# Patient Record
Sex: Female | Born: 1935
Health system: Southern US, Community
[De-identification: ages and names within clinical notes are randomized; demographics above are authoritative.]

## PROBLEM LIST (undated history)

## (undated) DIAGNOSIS — I10 Essential (primary) hypertension: Secondary | ICD-10-CM

## (undated) DIAGNOSIS — F419 Anxiety disorder, unspecified: Secondary | ICD-10-CM

## (undated) DIAGNOSIS — E7800 Pure hypercholesterolemia, unspecified: Secondary | ICD-10-CM

## (undated) DIAGNOSIS — H409 Unspecified glaucoma: Secondary | ICD-10-CM

## (undated) DIAGNOSIS — F32A Depression, unspecified: Secondary | ICD-10-CM

## (undated) DIAGNOSIS — H548 Legal blindness, as defined in USA: Secondary | ICD-10-CM

## (undated) DIAGNOSIS — E119 Type 2 diabetes mellitus without complications: Secondary | ICD-10-CM

## (undated) DIAGNOSIS — J449 Chronic obstructive pulmonary disease, unspecified: Secondary | ICD-10-CM

## (undated) DIAGNOSIS — K219 Gastro-esophageal reflux disease without esophagitis: Secondary | ICD-10-CM

## (undated) DIAGNOSIS — N189 Chronic kidney disease, unspecified: Secondary | ICD-10-CM

## (undated) DIAGNOSIS — F410 Panic disorder [episodic paroxysmal anxiety] without agoraphobia: Secondary | ICD-10-CM

## (undated) DIAGNOSIS — F329 Major depressive disorder, single episode, unspecified: Secondary | ICD-10-CM

## (undated) HISTORY — PX: HIP SURGERY: SHX245

## (undated) HISTORY — PX: APPENDECTOMY: SHX54

## (undated) HISTORY — PX: TUBAL LIGATION: SHX77

## (undated) HISTORY — PX: CHOLECYSTECTOMY: SHX55

## (undated) HISTORY — PX: OTHER SURGICAL HISTORY: SHX169

---

## 1998-05-04 ENCOUNTER — Inpatient Hospital Stay (HOSPITAL_COMMUNITY): Admission: EM | Admit: 1998-05-04 | Discharge: 1998-05-05 | Payer: Self-pay | Admitting: Cardiology

## 2001-02-04 ENCOUNTER — Encounter: Payer: Self-pay | Admitting: Internal Medicine

## 2001-02-04 ENCOUNTER — Ambulatory Visit (HOSPITAL_COMMUNITY): Admission: RE | Admit: 2001-02-04 | Discharge: 2001-02-04 | Payer: Self-pay | Admitting: Internal Medicine

## 2001-05-23 ENCOUNTER — Emergency Department (HOSPITAL_COMMUNITY): Admission: EM | Admit: 2001-05-23 | Discharge: 2001-05-23 | Payer: Self-pay | Admitting: Emergency Medicine

## 2001-05-23 ENCOUNTER — Encounter: Payer: Self-pay | Admitting: Emergency Medicine

## 2001-10-10 ENCOUNTER — Emergency Department (HOSPITAL_COMMUNITY): Admission: EM | Admit: 2001-10-10 | Discharge: 2001-10-10 | Payer: Self-pay | Admitting: *Deleted

## 2001-10-10 ENCOUNTER — Encounter: Payer: Self-pay | Admitting: *Deleted

## 2002-05-10 ENCOUNTER — Encounter: Payer: Self-pay | Admitting: *Deleted

## 2002-05-10 ENCOUNTER — Inpatient Hospital Stay (HOSPITAL_COMMUNITY): Admission: EM | Admit: 2002-05-10 | Discharge: 2002-05-18 | Payer: Self-pay | Admitting: *Deleted

## 2002-06-15 ENCOUNTER — Ambulatory Visit (HOSPITAL_COMMUNITY): Admission: RE | Admit: 2002-06-15 | Discharge: 2002-06-15 | Payer: Self-pay | Admitting: Pulmonary Disease

## 2002-06-22 ENCOUNTER — Encounter (HOSPITAL_COMMUNITY): Admission: RE | Admit: 2002-06-22 | Discharge: 2002-07-22 | Payer: Self-pay | Admitting: Pulmonary Disease

## 2002-10-17 ENCOUNTER — Encounter: Payer: Self-pay | Admitting: Emergency Medicine

## 2002-10-17 ENCOUNTER — Emergency Department (HOSPITAL_COMMUNITY): Admission: EM | Admit: 2002-10-17 | Discharge: 2002-10-17 | Payer: Self-pay | Admitting: Emergency Medicine

## 2002-12-17 ENCOUNTER — Ambulatory Visit (HOSPITAL_COMMUNITY): Admission: RE | Admit: 2002-12-17 | Discharge: 2002-12-17 | Payer: Self-pay | Admitting: Pulmonary Disease

## 2003-01-26 ENCOUNTER — Emergency Department (HOSPITAL_COMMUNITY): Admission: EM | Admit: 2003-01-26 | Discharge: 2003-01-27 | Payer: Self-pay | Admitting: Emergency Medicine

## 2003-01-27 ENCOUNTER — Encounter: Payer: Self-pay | Admitting: Emergency Medicine

## 2003-01-27 ENCOUNTER — Ambulatory Visit (HOSPITAL_COMMUNITY): Admission: RE | Admit: 2003-01-27 | Discharge: 2003-01-27 | Payer: Self-pay | Admitting: Emergency Medicine

## 2003-04-20 ENCOUNTER — Encounter (HOSPITAL_COMMUNITY): Admission: RE | Admit: 2003-04-20 | Discharge: 2003-05-20 | Payer: Self-pay | Admitting: *Deleted

## 2003-05-13 ENCOUNTER — Ambulatory Visit (HOSPITAL_COMMUNITY): Admission: RE | Admit: 2003-05-13 | Discharge: 2003-05-13 | Payer: Self-pay | Admitting: Pulmonary Disease

## 2003-11-10 ENCOUNTER — Ambulatory Visit (HOSPITAL_COMMUNITY): Admission: RE | Admit: 2003-11-10 | Discharge: 2003-11-10 | Payer: Self-pay | Admitting: Pulmonary Disease

## 2004-05-24 ENCOUNTER — Ambulatory Visit (HOSPITAL_COMMUNITY): Admission: RE | Admit: 2004-05-24 | Discharge: 2004-05-24 | Payer: Self-pay

## 2004-06-01 ENCOUNTER — Ambulatory Visit (HOSPITAL_COMMUNITY): Admission: RE | Admit: 2004-06-01 | Discharge: 2004-06-01 | Payer: Self-pay | Admitting: Pulmonary Disease

## 2004-08-27 HISTORY — PX: ESOPHAGOGASTRODUODENOSCOPY: SHX1529

## 2004-10-05 ENCOUNTER — Ambulatory Visit (HOSPITAL_COMMUNITY): Admission: RE | Admit: 2004-10-05 | Discharge: 2004-10-05 | Payer: Self-pay | Admitting: Podiatry

## 2004-10-19 ENCOUNTER — Inpatient Hospital Stay (HOSPITAL_COMMUNITY): Admission: EM | Admit: 2004-10-19 | Discharge: 2004-10-22 | Payer: Self-pay | Admitting: Emergency Medicine

## 2004-10-20 ENCOUNTER — Ambulatory Visit: Payer: Self-pay | Admitting: *Deleted

## 2004-11-30 ENCOUNTER — Ambulatory Visit (HOSPITAL_COMMUNITY): Admission: RE | Admit: 2004-11-30 | Discharge: 2004-11-30 | Payer: Self-pay | Admitting: Pulmonary Disease

## 2004-12-10 ENCOUNTER — Emergency Department (HOSPITAL_COMMUNITY): Admission: EM | Admit: 2004-12-10 | Discharge: 2004-12-10 | Payer: Self-pay | Admitting: Emergency Medicine

## 2005-02-13 ENCOUNTER — Inpatient Hospital Stay (HOSPITAL_COMMUNITY): Admission: EM | Admit: 2005-02-13 | Discharge: 2005-02-17 | Payer: Self-pay | Admitting: Emergency Medicine

## 2005-03-30 ENCOUNTER — Ambulatory Visit (HOSPITAL_COMMUNITY): Admission: RE | Admit: 2005-03-30 | Discharge: 2005-03-30 | Payer: Self-pay | Admitting: Pulmonary Disease

## 2005-04-11 ENCOUNTER — Ambulatory Visit (HOSPITAL_COMMUNITY): Admission: RE | Admit: 2005-04-11 | Discharge: 2005-04-11 | Payer: Self-pay | Admitting: Internal Medicine

## 2005-04-11 ENCOUNTER — Ambulatory Visit: Payer: Self-pay | Admitting: Internal Medicine

## 2005-04-22 ENCOUNTER — Observation Stay (HOSPITAL_COMMUNITY): Admission: EM | Admit: 2005-04-22 | Discharge: 2005-04-24 | Payer: Self-pay | Admitting: Emergency Medicine

## 2005-04-24 ENCOUNTER — Ambulatory Visit: Payer: Self-pay | Admitting: Orthopedic Surgery

## 2005-05-07 ENCOUNTER — Ambulatory Visit: Payer: Self-pay | Admitting: Orthopedic Surgery

## 2005-05-22 ENCOUNTER — Ambulatory Visit (HOSPITAL_COMMUNITY): Admission: RE | Admit: 2005-05-22 | Discharge: 2005-05-22 | Payer: Self-pay | Admitting: Pulmonary Disease

## 2005-06-07 ENCOUNTER — Ambulatory Visit: Payer: Self-pay | Admitting: Orthopedic Surgery

## 2005-06-12 ENCOUNTER — Emergency Department (HOSPITAL_COMMUNITY): Admission: EM | Admit: 2005-06-12 | Discharge: 2005-06-13 | Payer: Self-pay | Admitting: Emergency Medicine

## 2005-06-19 ENCOUNTER — Ambulatory Visit: Payer: Self-pay | Admitting: Orthopedic Surgery

## 2005-06-19 ENCOUNTER — Encounter: Payer: Self-pay | Admitting: Orthopedic Surgery

## 2005-06-19 ENCOUNTER — Inpatient Hospital Stay (HOSPITAL_COMMUNITY): Admission: RE | Admit: 2005-06-19 | Discharge: 2005-06-26 | Payer: Self-pay | Admitting: Orthopedic Surgery

## 2005-07-09 ENCOUNTER — Ambulatory Visit: Payer: Self-pay | Admitting: Orthopedic Surgery

## 2005-07-30 ENCOUNTER — Ambulatory Visit: Payer: Self-pay | Admitting: Orthopedic Surgery

## 2005-08-02 ENCOUNTER — Encounter: Admission: RE | Admit: 2005-08-02 | Discharge: 2005-08-02 | Payer: Self-pay | Admitting: Orthopedic Surgery

## 2005-08-16 ENCOUNTER — Encounter: Admission: RE | Admit: 2005-08-16 | Discharge: 2005-08-16 | Payer: Self-pay | Admitting: Orthopedic Surgery

## 2005-09-07 ENCOUNTER — Encounter: Admission: RE | Admit: 2005-09-07 | Discharge: 2005-09-07 | Payer: Self-pay | Admitting: Orthopedic Surgery

## 2005-09-12 ENCOUNTER — Ambulatory Visit: Payer: Self-pay | Admitting: Orthopedic Surgery

## 2005-10-10 ENCOUNTER — Ambulatory Visit: Payer: Self-pay | Admitting: Orthopedic Surgery

## 2005-10-31 ENCOUNTER — Emergency Department (HOSPITAL_COMMUNITY): Admission: EM | Admit: 2005-10-31 | Discharge: 2005-10-31 | Payer: Self-pay | Admitting: Emergency Medicine

## 2005-11-12 ENCOUNTER — Ambulatory Visit: Payer: Self-pay | Admitting: Orthopedic Surgery

## 2005-12-05 ENCOUNTER — Ambulatory Visit: Payer: Self-pay | Admitting: Orthopedic Surgery

## 2005-12-06 ENCOUNTER — Ambulatory Visit (HOSPITAL_COMMUNITY): Admission: RE | Admit: 2005-12-06 | Discharge: 2005-12-06 | Payer: Self-pay | Admitting: Orthopedic Surgery

## 2005-12-20 ENCOUNTER — Ambulatory Visit: Payer: Self-pay | Admitting: Orthopedic Surgery

## 2006-01-04 ENCOUNTER — Ambulatory Visit (HOSPITAL_COMMUNITY): Admission: RE | Admit: 2006-01-04 | Discharge: 2006-01-04 | Payer: Self-pay | Admitting: Orthopedic Surgery

## 2006-01-09 ENCOUNTER — Ambulatory Visit (HOSPITAL_COMMUNITY): Admission: RE | Admit: 2006-01-09 | Discharge: 2006-01-09 | Payer: Self-pay | Admitting: Pulmonary Disease

## 2006-04-10 ENCOUNTER — Ambulatory Visit: Payer: Self-pay | Admitting: Orthopedic Surgery

## 2006-06-03 ENCOUNTER — Ambulatory Visit: Payer: Self-pay | Admitting: Orthopedic Surgery

## 2006-10-02 ENCOUNTER — Emergency Department (HOSPITAL_COMMUNITY): Admission: EM | Admit: 2006-10-02 | Discharge: 2006-10-02 | Payer: Self-pay | Admitting: Emergency Medicine

## 2007-02-11 ENCOUNTER — Ambulatory Visit (HOSPITAL_COMMUNITY): Admission: RE | Admit: 2007-02-11 | Discharge: 2007-02-11 | Payer: Self-pay | Admitting: Pulmonary Disease

## 2007-05-13 DIAGNOSIS — E119 Type 2 diabetes mellitus without complications: Secondary | ICD-10-CM | POA: Insufficient documentation

## 2007-09-01 ENCOUNTER — Ambulatory Visit (HOSPITAL_COMMUNITY): Admission: RE | Admit: 2007-09-01 | Discharge: 2007-09-01 | Payer: Self-pay | Admitting: Pulmonary Disease

## 2007-11-17 ENCOUNTER — Ambulatory Visit: Payer: Self-pay | Admitting: Orthopedic Surgery

## 2007-11-17 DIAGNOSIS — M75120 Complete rotator cuff tear or rupture of unspecified shoulder, not specified as traumatic: Secondary | ICD-10-CM | POA: Insufficient documentation

## 2007-11-17 DIAGNOSIS — M25519 Pain in unspecified shoulder: Secondary | ICD-10-CM | POA: Insufficient documentation

## 2007-12-24 ENCOUNTER — Ambulatory Visit: Payer: Self-pay | Admitting: Orthopedic Surgery

## 2007-12-24 DIAGNOSIS — M542 Cervicalgia: Secondary | ICD-10-CM | POA: Insufficient documentation

## 2008-01-21 ENCOUNTER — Encounter: Payer: Self-pay | Admitting: Orthopedic Surgery

## 2008-01-21 ENCOUNTER — Ambulatory Visit (HOSPITAL_COMMUNITY): Admission: RE | Admit: 2008-01-21 | Discharge: 2008-01-21 | Payer: Self-pay | Admitting: Pulmonary Disease

## 2008-01-27 ENCOUNTER — Ambulatory Visit (HOSPITAL_COMMUNITY): Admission: RE | Admit: 2008-01-27 | Discharge: 2008-01-27 | Payer: Self-pay | Admitting: Pulmonary Disease

## 2008-01-27 ENCOUNTER — Encounter: Payer: Self-pay | Admitting: Orthopedic Surgery

## 2008-01-29 ENCOUNTER — Encounter: Payer: Self-pay | Admitting: Orthopedic Surgery

## 2008-02-10 ENCOUNTER — Ambulatory Visit: Payer: Self-pay | Admitting: Orthopedic Surgery

## 2008-02-10 DIAGNOSIS — M25569 Pain in unspecified knee: Secondary | ICD-10-CM | POA: Insufficient documentation

## 2008-02-10 DIAGNOSIS — G5620 Lesion of ulnar nerve, unspecified upper limb: Secondary | ICD-10-CM | POA: Insufficient documentation

## 2008-02-10 DIAGNOSIS — IMO0002 Reserved for concepts with insufficient information to code with codable children: Secondary | ICD-10-CM | POA: Insufficient documentation

## 2008-02-10 DIAGNOSIS — M1710 Unilateral primary osteoarthritis, unspecified knee: Secondary | ICD-10-CM

## 2008-02-11 ENCOUNTER — Ambulatory Visit (HOSPITAL_COMMUNITY): Admission: RE | Admit: 2008-02-11 | Discharge: 2008-02-11 | Payer: Self-pay | Admitting: Pulmonary Disease

## 2008-03-17 ENCOUNTER — Ambulatory Visit: Payer: Self-pay | Admitting: Orthopedic Surgery

## 2008-03-17 DIAGNOSIS — M25559 Pain in unspecified hip: Secondary | ICD-10-CM | POA: Insufficient documentation

## 2008-03-17 DIAGNOSIS — M5430 Sciatica, unspecified side: Secondary | ICD-10-CM | POA: Insufficient documentation

## 2008-03-19 ENCOUNTER — Telehealth: Payer: Self-pay | Admitting: Orthopedic Surgery

## 2008-03-30 ENCOUNTER — Encounter: Payer: Self-pay | Admitting: Orthopedic Surgery

## 2008-04-14 ENCOUNTER — Ambulatory Visit: Payer: Self-pay | Admitting: Orthopedic Surgery

## 2008-04-14 DIAGNOSIS — Z96649 Presence of unspecified artificial hip joint: Secondary | ICD-10-CM | POA: Insufficient documentation

## 2008-04-23 ENCOUNTER — Telehealth: Payer: Self-pay | Admitting: Orthopedic Surgery

## 2008-06-01 ENCOUNTER — Encounter: Payer: Self-pay | Admitting: Orthopedic Surgery

## 2008-09-22 ENCOUNTER — Ambulatory Visit: Payer: Self-pay | Admitting: Orthopedic Surgery

## 2009-04-04 ENCOUNTER — Ambulatory Visit: Payer: Self-pay | Admitting: Orthopedic Surgery

## 2009-04-16 ENCOUNTER — Emergency Department (HOSPITAL_COMMUNITY): Admission: EM | Admit: 2009-04-16 | Discharge: 2009-04-16 | Payer: Self-pay | Admitting: Emergency Medicine

## 2009-04-18 ENCOUNTER — Ambulatory Visit (HOSPITAL_COMMUNITY): Admission: RE | Admit: 2009-04-18 | Discharge: 2009-04-18 | Payer: Self-pay | Admitting: Pulmonary Disease

## 2009-06-14 ENCOUNTER — Ambulatory Visit: Payer: Self-pay | Admitting: Orthopedic Surgery

## 2009-07-06 ENCOUNTER — Telehealth: Payer: Self-pay | Admitting: Orthopedic Surgery

## 2009-07-06 ENCOUNTER — Ambulatory Visit: Payer: Self-pay | Admitting: Orthopedic Surgery

## 2009-07-12 ENCOUNTER — Encounter: Payer: Self-pay | Admitting: Orthopedic Surgery

## 2010-02-08 ENCOUNTER — Ambulatory Visit: Payer: Self-pay | Admitting: Orthopedic Surgery

## 2010-03-08 ENCOUNTER — Inpatient Hospital Stay (HOSPITAL_COMMUNITY): Admission: RE | Admit: 2010-03-08 | Discharge: 2010-03-11 | Payer: Self-pay | Admitting: Pulmonary Disease

## 2010-05-24 ENCOUNTER — Ambulatory Visit: Payer: Self-pay | Admitting: Orthopedic Surgery

## 2010-06-09 ENCOUNTER — Emergency Department (HOSPITAL_COMMUNITY): Admission: EM | Admit: 2010-06-09 | Discharge: 2010-06-09 | Payer: Self-pay | Admitting: Emergency Medicine

## 2010-08-08 ENCOUNTER — Encounter: Payer: Self-pay | Admitting: Orthopedic Surgery

## 2010-08-15 ENCOUNTER — Ambulatory Visit: Payer: Self-pay | Admitting: Orthopedic Surgery

## 2010-09-17 ENCOUNTER — Encounter: Payer: Self-pay | Admitting: Orthopedic Surgery

## 2010-09-26 NOTE — Assessment & Plan Note (Signed)
Summary: LEFT KNEE PAIN REQUEST INJECTION MAY NEED NEW XR/MEDICARE/MED...   Visit Type:  Follow-up  CC:  left knee pain.  History of Present Illness: I saw [name] in the office today for a followup visit.  She is a 77 years old woman with the complaint of:  left knee OA.  04/04/09 last injection left knee, requests injection today.  Also has bad back, gets ESI's Dr. Ace Gins, Dr. Luiz Ochoa is back DR.  Vicodin 5 and Neurontin 300 for pain.  Chronic LEFT knee pain osteoarthritis patient not interested in surgery here for injection  LEFT knee injection in the joint Verbal consent was obtained. The knee was prepped with alcohol and ethyl chloride. 1 cc of depomedrol 40mg /cc and 4 cc of lidocaine 1% was injected. there were no complications.     Allergies: 1)  ! Phenergan 2)  ! * Dilaudid   Impression & Recommendations:  Problem # 1:  KNEE PAIN [id number]) Assessment Deteriorated  Orders: Joint Aspirate / Injection, Large (20610) Depo- Medrol 40mg  (J1030)  Problem # 2:  KNEE, ARTHRITIS, DEGEN./OSTEO (ICD-715.96) Assessment: Deteriorated  Orders: Joint Aspirate / Injection, Large (20610) Depo- Medrol 40mg  (J1030)  Patient Instructions: 1)  You have received an injection of cortisone today. You may experience increased pain at the injection site. Apply ice pack to the area for 20 minutes every 2 hours and take 2 xtra strength tylenol every 8 hours. This increased pain will usually resolve in 24 hours. The injection will take effect in 3-10 days.   2)  ok to get injection in 3 months if needed

## 2010-09-26 NOTE — Assessment & Plan Note (Signed)
Summary: REQ INJECTION LEFT KNEE MAY NEED NEW  xr/mcr/mcd/bsf   Visit Type:  Follow-up  CC:  left knee pain.  History of Present Illness: I saw [name] in the office today for a followup visit.  She is a 77 years old woman with the complaint of:  LEFT knee pain.  She had her last injection in June of this year. Requests another injection for pain, which started 2 weeks ago, primarily over the lateral portion of the knee associated with some catching I. maybe some mild swelling.  Had a RIGHT total hip arthroplasty in 2006. She is followed by Dr. Alisa Graff, and Dr.Bethea for continued back problems. She is currently on the following medications  Vicodin 5 and Neurontin 300 for pain. No change in medications.  Chronic LEFT knee pain osteoarthritis patient not interested in surgery here for injection  ROS: RT THA 2006   Allergies: 1)  ! Phenergan 2)  ! * Dilaudid  Physical Exam  Extremities:  she is ambulating with a cane in her RIGHT hand occasionally uses it in the LEFT hand. She has a noticeable limp.  He has tenderness over the lateral joint line with a mild effusion. She has normal range of motion from 0 to 110. She has no catching or locking. On range of motion. Her McMurray sign is negative. Her muscle tone is normal. Her knee feels stable. She has normal sensation in the LEFT leg with a 2+ pulse in the dorsalis pedis.   Impression & Recommendations:  Problem # 1:  KNEE, ARTHRITIS, DEGEN./OSTEO [id number]) Assessment Deteriorated  I injected the LEFT knee. Lateral approach Verbal consent was obtained. The knee was prepped with alcohol and ethyl chloride. 1 cc of depomedrol 40mg /cc and 4 cc of lidocaine 1% was injected. there were no complications.  Orders: Est. Patient Level III [id number]) Joint Aspirate / Injection, Large (20610) Depo- Medrol 40mg  (J1030)  Patient Instructions: 1)  You have received an injection of cortisone today. You may experience increased pain  at the injection site. Apply ice pack to the area for 20 minutes every 2 hours and take 2 xtra strength tylenol every 8 hours. This increased pain will usually resolve in 24 hours. The injection will take effect in 3-10 days.   2)  Please schedule a follow-up appointment as needed.

## 2010-09-28 NOTE — Medication Information (Signed)
Summary: RX Folder Comm [employer] coding  RX Folder Comm [employer] coding   Imported By: Ihor Austin 08/29/2010 18:05:16  _____________________________________________________________________  External Attachment:    Type:   Image     Comment:   External Document

## 2010-09-28 NOTE — Assessment & Plan Note (Signed)
Summary: RE-CK/XRAY RT HIP/THA FOL/UP/MEDICARE,MCD/CAF   Visit Type:  Follow-up  CC:  left knee pain.  History of Present Illness: DX:OA right hip   Treatment:RT THA 2006  MEDS:   Complaints:left kneepain / h/o OA; requests a shot   Today, scheduled for:RT THA XRAYS AP AND CROSS TABLE LATERAL   Radiographs of the RIGHT total hip show the following: Appearing stem and acetabulum with good anteversion in the cup. No signs of loosening prosthesis appears stable.  Impression stable prosthesis.  Patient requested injection LEFT knee.  Verbal consent was obtained. The left knee was prepped with alcohol and ethyl chloride. 1 cc of depomedrol 40mg /cc and 4 cc of lidocaine 1% was injected. there were no complications.    Allergies: 1)  ! Phenergan 2)  ! * Dilaudid   Impression & Recommendations:  Problem # 1:  TOTAL HIP FOLLOW-UP (ICD-V43.64) Assessment Comment Only  Orders: Est. Patient Level III [id number]) Pelvis x-ray, 1/2 views [id number]) Hip x-ray unilateral complete, minimum 2 views [id number])  Problem # 2:  KNEE, ARTHRITIS, DEGEN./OSTEO (ICD-715.96) Assessment: Deteriorated  Orders: Est. Patient Level III [id number]) Joint Aspirate / Injection, Large (20610) Depo- Medrol 40mg  (J1030)  Patient Instructions: 1)  ASPERCREME three times a day left knee  2)  return in 1 year THA XRAYS   Orders Added: 1)  Est. Patient Level III [id number] 2)  Pelvis x-ray, 1/2 views [72170] 3)  Hip x-ray unilateral complete, minimum 2 views [73510] 4)  Joint Aspirate / Injection, Large [20610] 5)  Depo- Medrol 40mg  [J1030]

## 2010-09-28 NOTE — Letter (Signed)
Summary: Updated medication list  Updated medication list   Imported By: Ruffin Pyo 08/16/2010 07:47:07  _____________________________________________________________________  External Attachment:    Type:   Image     Comment:   External Document

## 2010-10-13 ENCOUNTER — Other Ambulatory Visit (HOSPITAL_COMMUNITY): Payer: Self-pay | Admitting: Pulmonary Disease

## 2010-10-13 ENCOUNTER — Ambulatory Visit (HOSPITAL_COMMUNITY)
Admission: RE | Admit: 2010-10-13 | Discharge: 2010-10-13 | Disposition: A | Discharge status: Home / Self Care | Payer: Medicare Other | Source: Ambulatory Visit | Attending: Pulmonary Disease | Admitting: Pulmonary Disease

## 2010-10-13 DIAGNOSIS — M5380 Other specified dorsopathies, site unspecified: Secondary | ICD-10-CM | POA: Insufficient documentation

## 2010-10-13 DIAGNOSIS — M542 Cervicalgia: Secondary | ICD-10-CM | POA: Insufficient documentation

## 2010-10-13 DIAGNOSIS — M5030 Other cervical disc degeneration, unspecified cervical region: Secondary | ICD-10-CM | POA: Insufficient documentation

## 2010-11-11 LAB — CBC
MCV: 90.2 fL (ref 78.0–100.0)
Platelets: 272 10*3/uL (ref 150–400)
RBC: 3.64 MIL/uL — ABNORMAL LOW (ref 3.87–5.11)
RDW: 15.5 % (ref 11.5–15.5)
WBC: 12.9 10*3/uL — ABNORMAL HIGH (ref 4.0–10.5)

## 2010-11-11 LAB — BASIC METABOLIC PANEL
BUN: 28 mg/dL — ABNORMAL HIGH (ref 6–23)
Calcium: 8.3 mg/dL — ABNORMAL LOW (ref 8.4–10.5)
Chloride: 110 mEq/L (ref 96–112)
Creatinine, Ser: 1.34 mg/dL — ABNORMAL HIGH (ref 0.4–1.2)
GFR calc Af Amer: 46 mL/min — ABNORMAL LOW (ref >60)
GFR calc non Af Amer: 38 mL/min — ABNORMAL LOW (ref >60)

## 2010-11-11 LAB — DIFFERENTIAL
Basophils Absolute: 0 10*3/uL (ref 0.0–0.1)
Eosinophils Relative: 0 % (ref 0–5)
Lymphocytes Relative: 4 % — ABNORMAL LOW (ref 12–46)
Lymphs Abs: 0.5 10*3/uL — ABNORMAL LOW (ref 0.7–4.0)
Neutrophils Relative %: 95 % — ABNORMAL HIGH (ref 43–77)

## 2010-11-11 LAB — GLUCOSE, CAPILLARY
Glucose-Capillary: 250 mg/dL — ABNORMAL HIGH (ref 70–99)
Glucose-Capillary: 322 mg/dL — ABNORMAL HIGH (ref 70–99)
Glucose-Capillary: 335 mg/dL — ABNORMAL HIGH (ref 70–99)
Glucose-Capillary: 355 mg/dL — ABNORMAL HIGH (ref 70–99)

## 2010-11-12 LAB — DIFFERENTIAL
Eosinophils Relative: 2 % (ref 0–5)
Lymphocytes Relative: 21 % (ref 12–46)
Lymphs Abs: 1.5 10*3/uL (ref 0.7–4.0)
Monocytes Absolute: 0.4 10*3/uL (ref 0.1–1.0)
Monocytes Relative: 6 % (ref 3–12)

## 2010-11-12 LAB — GLUCOSE, CAPILLARY
Glucose-Capillary: 330 mg/dL — ABNORMAL HIGH (ref 70–99)
Glucose-Capillary: 352 mg/dL — ABNORMAL HIGH (ref 70–99)
Glucose-Capillary: 372 mg/dL — ABNORMAL HIGH (ref 70–99)
Glucose-Capillary: 376 mg/dL — ABNORMAL HIGH (ref 70–99)

## 2010-11-12 LAB — CBC
MCH: 29.9 pg (ref 26.0–34.0)
MCHC: 33.2 g/dL (ref 30.0–36.0)
Platelets: 258 10*3/uL (ref 150–400)
RBC: 4.26 MIL/uL (ref 3.87–5.11)

## 2010-11-12 LAB — URINALYSIS, ROUTINE W REFLEX MICROSCOPIC
Bilirubin Urine: NEGATIVE
Ketones, ur: NEGATIVE mg/dL
Nitrite: NEGATIVE
Specific Gravity, Urine: 1.015 (ref 1.005–1.030)
Urobilinogen, UA: 0.2 mg/dL (ref 0.0–1.0)

## 2010-11-12 LAB — COMPREHENSIVE METABOLIC PANEL
AST: 16 U/L (ref 0–37)
Albumin: 3.8 g/dL (ref 3.5–5.2)
Calcium: 8.9 mg/dL (ref 8.4–10.5)
Creatinine, Ser: 1.63 mg/dL — ABNORMAL HIGH (ref 0.4–1.2)
GFR calc Af Amer: 37 mL/min — ABNORMAL LOW (ref >60)
Sodium: 139 mEq/L (ref 135–145)

## 2010-12-02 LAB — CBC
Hemoglobin: 11.9 g/dL — ABNORMAL LOW (ref 12.0–15.0)
Platelets: 243 10*3/uL (ref 150–400)
RDW: 15.1 % (ref 11.5–15.5)

## 2010-12-02 LAB — BASIC METABOLIC PANEL
Calcium: 9.1 mg/dL (ref 8.4–10.5)
GFR calc non Af Amer: 32 mL/min — ABNORMAL LOW (ref >60)
Glucose, Bld: 225 mg/dL — ABNORMAL HIGH (ref 70–99)
Sodium: 140 mEq/L (ref 135–145)

## 2010-12-02 LAB — DIFFERENTIAL
Basophils Absolute: 0 10*3/uL (ref 0.0–0.1)
Lymphocytes Relative: 26 % (ref 12–46)
Monocytes Absolute: 0.4 10*3/uL (ref 0.1–1.0)
Neutro Abs: 4.7 10*3/uL (ref 1.7–7.7)

## 2011-01-08 ENCOUNTER — Other Ambulatory Visit: Payer: Self-pay | Admitting: Orthopedic Surgery

## 2011-01-12 NOTE — Group Therapy Note (Signed)
NAMEKADYNN, SCHANTZ                ACCOUNT NO.:  [id number]   MEDICAL RECORD NO.:  [id number]          PATIENT TYPE:  INP   LOCATION:  [id number]                          FACILITY:  [facility]   PHYSICIAN:  Edward L. Luan Pulling, M.D.DATE OF BIRTH:  09-08-1928   DATE OF PROCEDURE:  02/13/2005  DATE OF DISCHARGE:                                   PROGRESS NOTE   PROBLEM:  COPD.   SUBJECTIVE:  Mrs. Linnehan says she is better.  She had some chest discomfort  again last night.  Again she has had cardiac disease essentially ruled out.  She has no other new complaints.   Her exam shows that her temperature is 97.2, pulse 47, respirations 20.  Blood sugar 199.  Blood pressure 124/75.  O2 sats 99% on 2 liters.  Her  chest is clearer than it has been.   ASSESSMENT:  She is better.   Plan is to continue treatments and medicines and follow.  No other new  medicines today.       ELH/MEDQ  D:  02/15/2005  T:  02/15/2005  Job:  [id number]

## 2011-01-12 NOTE — Group Therapy Note (Signed)
   NAME:  [name], [name]                          ACCOUNT NO.:  [id number]   MEDICAL RECORD NO.:  [id number]                   PATIENT TYPE:  INP   LOCATION:  A326                                 FACILITY:  [facility]   PHYSICIAN:  Edward L. Luan Pulling, M.D.             DATE OF BIRTH:  19-May-1929   DATE OF PROCEDURE:  DATE OF DISCHARGE:                                   PROGRESS NOTE   PROBLEM:  Pneumonia, diabetes, reflux esophagitis.   SUBJECTIVE:  The patient says that she is feeling much better today.  She  has a headache and has a lot of congestion in her head, but she is not short  of breath.  Her cultures are negative as far as blood cultures are  concerned.   OBJECTIVE:  Her exam shows that her chest is much clearer.  She is afebrile.  Blood pressure 130/70.  Her blood sugars are all less than 200 at this  point.   ASSESSMENT:  She is much improved.   PLAN:  Plan is for discharge.  Please see discharge summary for details.                                               Edward L. Luan Pulling, M.D.    ELH/MEDQ  D:  05/18/2002  T:  05/19/2002  Job:  [id number]

## 2011-01-12 NOTE — Consult Note (Signed)
[name], LABORE NO.:  [id number]   MEDICAL RECORD NO.:  [id number]          PATIENT TYPE:  OBV   LOCATION:  A307                          FACILITY:  [facility]   PHYSICIAN:  Carole Civil, M.D.DATE OF BIRTH:  [birth year]   DATE OF CONSULTATION:  04/24/2005  DATE OF DISCHARGE:  04/24/2005                                   CONSULTATION   REASON FOR CONSULTATION:  Right hip pain.   REQUESTING PHYSICIAN:  Edward L. Luan Pulling, M.D.   HISTORY:  This is a 77 year old female with history of hypertension, COPD,  diabetes, hernia, hiatal hernia, GERD, lipids are elevated. She had a  tonsillectomy, cholecystectomy, vaginal hysterectomy, appendectomy.   ALLERGIES:  No allergies.   MEDICATIONS:  She is on Altace, glyburide, metformin, Aciphex, Xanax,  fluconazole, Xalatan, albuterol, and Atrovent.   FAMILY HISTORY:  She has family history of diabetes, hypertension, COPD.   SOCIAL HISTORY:  Does not smoke or drink.   REVIEW OF SYSTEMS:  Negative.   This patient presented with right hip pain. It was questionable whether she  had fracture versus osteoarthritis versus lumbar disease. She complained of  a catching sensation in the hip and had a severe pain and could not walk.  Was admitted for workup and pain control.   PHYSICAL EXAMINATION:  VITAL SIGNS:  Her exam showed a temperature of 97.4,  pulse 66, respiratory rate 16, blood pressure 120/80. Height was 64 inches.  GENERAL:  She was generally well developed and nourished. Grooming and  hygiene were normal.  CARDIOVASCULAR:  She had a relatively normal cardiovascular exam.  SKIN:  Skin integrity was intact in all four extremities.  LYMPH NODES:  She had no obvious lymph nodes.  NEUROLOGICAL:  She was awake, alert, and oriented x3. Mood and affect are  normal. Had a normal gross neurological exam.  EXTREMITIES:  Her right hip did have crepitance, pain on internal rotation.  There was no shortening. There was no  muscle tone atrophy. There was good  strength in all four extremities. Alignment was good as well as range of  motion and strength.   STUDIES:  Radiographs show severe osteoarthritis of the hip.   No evidence of AVN on MRI. MRI lumbar spine normal in terms of fracture. No  significant degenerative disk disease to cause pain.   IMPRESSION:  Osteoarthritis, right hip.   RECOMMENDATIONS:  Recommend follow to schedule total hip replacement.      Carole Civil, M.D.  Electronically Signed     SEH/MEDQ  D:  04/28/2005  T:  04/28/2005  Job:  [id number]

## 2011-01-12 NOTE — Procedures (Signed)
   NAME:  JUNG, BURBACK NO.:  [id number]   MEDICAL RECORD NO.:  [id number]                   PATIENT TYPE:  PREC   LOCATION:                                       FACILITY:   PHYSICIAN:  Scarlett Presto, M.D.                DATE OF BIRTH:   DATE OF PROCEDURE:  04/20/2003  DATE OF DISCHARGE:                                    STRESS TEST   ADENOSINE CARDIOLITE   INDICATION:  Ms. Mooring is a 77 year old female with no known coronary  artery disease who presented with atypical chest discomfort.  She had a  cardiac catheterization in 1999 which revealed normal coronary arteries and  normal LV function.  Her cardiac risk factors include diabetes and age.   BASELINE DATA:  EKG shows sinus bradycardia at 58 beats per minute with  nonspecific ST abnormalities, no change from previous EKG on December 25, 2002.  Baseline blood pressure was 152/88.   Adenosine 54 mg was infused over four minute protocol; Cardiolite was  injected at three minutes.  The patient reported chest tightness, flushing,  and nausea - all of which resolved in recovery.  EKG showed no ischemic  changes and few PACs.   Final images and results are pending M.D. review.     Amy Nelida Gores, P.A. [facility]                     Scarlett Presto, M.D.    AB/MEDQ  D:  04/20/2003  T:  04/20/2003  Job:  [id number]

## 2011-01-12 NOTE — Group Therapy Note (Signed)
   NAME:  [name], [name]                          ACCOUNT NO.:  [id number]   MEDICAL RECORD NO.:  [id number]                   PATIENT TYPE:  INP   LOCATION:  A326                                 FACILITY:  [facility]   PHYSICIAN:  Edward L. Luan Pulling, M.D.             DATE OF BIRTH:  [birth year]   DATE OF PROCEDURE:  05/15/2002  DATE OF DISCHARGE:                                   PROGRESS NOTE   PROBLEM:  Pneumonia.   SUBJECTIVE:  The patient is doing better, she says.  She has no new  complaints.  She denies any nausea or vomiting.  She is still coughing some.   OBJECTIVE:  Her physical examination shows that she still has mild wheezing,  her heart is regular, her abdomen is soft.  Extremities showed no edema.  CNS grossly intact.   ASSESSMENT:  She has still got a lot of wheezes, a lot of cough from her  pneumonia.   PLAN:  My plan is to discontinue IV fluids and she may be able to be  discharged home fairly soon.                                               Edward L. Luan Pulling, M.D.    ELH/MEDQ  D:  05/15/2002  T:  05/18/2002  Job:  [id number]

## 2011-01-12 NOTE — Consult Note (Signed)
NAMEJNYLA, JORDAN                ACCOUNT NO.:  [id number]   MEDICAL RECORD NO.:  [id number]          PATIENT TYPE:  INP   LOCATION:  [id number]                          FACILITY:  [facility]   PHYSICIAN:  Edward L. Luan Pulling, M.D.DATE OF BIRTH:  January 28, 1929   DATE OF CONSULTATION:  06/22/2005  DATE OF DISCHARGE:                                   CONSULTATION   PRIMARY CARE PHYSICIAN:  Dr. Aline Brochure.   REASON FOR CONSULTATION:  Chronic obstructive pulmonary disease and  confusion.   HISTORY OF PRESENT ILLNESS:  Mrs. Asare underwent a hip replacement on  October 24, and had been doing fairly well then developed marked confusion  starting mostly last night, went through the night very confused and has  continued so this morning.  According to family, she has had a previous  episode of similar confusion when she was placed on Dilaudid in the  emergency room, and she is on a Dilaudid PCA pump which may be the problem.  She has been somewhat short of breath as well.  She is using her oxygen, but  she is very confused and having difficulty with cooperating.  Otherwise, she  has done well postop.   PAST MEDICAL HISTORY:  1.  Chronic obstructive pulmonary disease.  2.  Diabetes.  3.  Gastroesophageal reflux disease.  4.  Hyperlipidemia.  5.  Degenerative disk disease.  6.  Severe osteoarthritis with hip degeneration.   PAST SURGICAL HISTORY:  1.  Tonsillectomy.  2.  Cholecystectomy.  3.  Hysterectomy.  4.  Appendectomy.   MEDICATIONS:  1.  Altace.  2.  Glyburide.  3.  Metformin.  4.  Aciphex.  5.  Xanax.  6.  Fluconazole.  7.  Xalatan.  8.  Albuterol and Atrovent.   FAMILY HISTORY:  Very positive for hypertension, diabetes and COPD.   SOCIAL HISTORY:  She does not smoke.  She does not drink any alcohol.  Family is in the area and provides care.   REVIEW OF SYSTEMS:  Except as mentioned, pretty much negative now.  She  cannot tell me too much at this point.   PHYSICAL EXAMINATION:   GENERAL APPEARANCE:  She is very confused.  She is  speaking in rambling, incoherent sentences and unable to really fully  realize what has happened.  She is oriented to being in the hospital, and  she knows who I am, but she just does not seem to realize what has happened.  VITAL SIGNS:  Temperature 99.1, pulse 98, respirations 20, blood sugar 178,  blood pressure 127/73, O2 saturations 96% on 2 liters.  HEENT:  Mucous membranes are minimally dry.  Nose and throat are clear.  She  is wearing oxygen.  CHEST:  Actually very clear today with no rhonchi or  rales.  HEART:  Regular without gallop.  ABDOMEN:  Soft.  EXTREMITIES:  I did not exam her leg.  CNS:  Shows that she is very confused.   LABORATORY DATA:  Sodium down some at 126, potassium 5.3, BUN 53, creatinine  3.3.  Both of these were up, and I  suspect she is a little bit dry.  CBC  shows white count 11,200, hemoglobin 9.6, platelets 298.   ASSESSMENT:  She is very confused.  Her hyponatremia may play some part, but  I think it is more related to her Dilaudid.  I would like to see what she  does off of Dilaudid.  I think she will be able to eat and drink better.  We  may need to increase IV fluids.  I plan to go ahead and give her a bit more  IV fluids now, continue with her other treatments, get her off the Dilaudid  which you have already done.  I have ordered Xopenex nebulizer treatments  and we will keep her on the oxygen.      Edward L. Luan Pulling, M.D.  Electronically Signed     ELH/MEDQ  D:  06/22/2005  T:  06/22/2005  Job:  [id number]

## 2011-01-12 NOTE — Group Therapy Note (Signed)
   NAME:  [name], [name]                    ACCOUNT NO.:  [id number]   MEDICAL RECORD NO.:  [id number]                   PATIENT TYPE:  INP   LOCATION:  A326                                 FACILITY:  [facility]   PHYSICIAN:  Alonza Bogus, M.D.              DATE OF BIRTH:  [birth year]   DATE OF PROCEDURE:  DATE OF DISCHARGE:                                   PROGRESS NOTE   SUBJECTIVE:  The patient says she is feeling a little bit better.  She has  pneumonia.  She is still coughing.  She is coughing up some sputum, but not  as much.  She denies any other new complaints at this point.   PHYSICAL EXAMINATION:  LUNGS:  She is still wheezing some, but not as much.  Her chest is clearer than yesterday.  ABDOMEN:  Soft.   LABORATORIES:  Her blood sugars are running around 120-200.   ASSESSMENT:  She is doing relatively well, getting better.   PLAN:  Continue treatments.  Continue medications and follow.  I have told  her and her family that I think she is starting to turn the corner and get  better.  I do not plan any new treatments today.                                               Alonza Bogus, M.D.    ELH/MEDQ  D:  05/12/2002  T:  05/12/2002  Job:  [id number]

## 2011-01-12 NOTE — Group Therapy Note (Signed)
   NAME:  [name], [name]                          ACCOUNT NO.:  [id number]   MEDICAL RECORD NO.:  [id number]                   PATIENT TYPE:  INP   LOCATION:  A326                                 FACILITY:  [facility]   PHYSICIAN:  Edward L. Luan Pulling, M.D.             DATE OF BIRTH:  03-08-1929   DATE OF PROCEDURE:  05/14/2002  DATE OF DISCHARGE:                                   PROGRESS NOTE   PROBLEMS:  1. Pneumonia.  2. Diabetes.  3. Hypertension.   SUBJECTIVE:  The patient says she is feeling better today but she is still  coughing and still pretty congested.  She said that she slept fairly well  last night and she has no new complaints.   PHYSICAL EXAMINATION:  She does still feel somewhat short of breath but I  expect that she has rhonchi bilaterally in her chest.  She is afebrile. Her  abdomen is soft.   ASSESSMENT:  Things seem to be going fairly well in general.   PLAN:  The plan is to have her take her current medications as before  without change but I am going to add something for cough.  She has not  actually been taking any cough medications at this point.  I am going to put  her on Tussionex 5 cc q.12h. p.r.n. for cough.                                               Edward L. Luan Pulling, M.D.    ELH/MEDQ  D:  05/14/2002  T:  05/18/2002  Job:  [id number]

## 2011-01-12 NOTE — Op Note (Signed)
[name], [name]                ACCOUNT NO.:  [id number]   MEDICAL RECORD NO.:  [id number]          PATIENT TYPE:  AMB   LOCATION:  DAY                           FACILITY:  [facility]   PHYSICIAN:  R. Garfield Cornea, M.D. DATE OF BIRTH:  [birth year]   DATE OF PROCEDURE:  04/11/2005  DATE OF DISCHARGE:                                 OPERATIVE REPORT   PROCEDURE:  Esophagogastroduodenoscopy with Venia Minks dilation.   INDICATIONS FOR PROCEDURE:  The patient is a 77 year old lady with a long  history of gastroesophageal reflux disease who now has a two month history  of esophageal dysphagia to solid foods. She is referred by Dr. Luan Pulling for  EGD. This approach has been discussed with the patient at length. Potential  risks, benefits, and alternatives have been reviewed and questions answered.  She is agreeable. Please see documentation in the medical record.   PROCEDURE NOTE:  O2 saturation, blood pressure, pulse, and respirations were  monitored throughout the entire procedure. Conscious sedation with Versed 4  mg IV and Demerol 75 mg IV in divided doses.   INSTRUMENT:  Olympus video chip system.   FINDINGS:  Examination of the tubular esophagus revealed no mucosal  abnormalities. EG junction was easily traversed.   Stomach:  Gastric cavity was empty and insufflated well with air. Thorough  examination of gastric mucosa including retroflexed view of the proximal  stomach and esophagogastric junction demonstrated only small hiatal hernia.  Pylorus patent and easily traversed. Examination of bulb and second portion  revealed no abnormalities.   THERAPEUTIC/DIAGNOSTIC MANEUVERS:  A 58-French Maloney dilator was passed to  full insertion with ease. A look back revealed no apparent complication  related to passage of the dilator. The patient tolerated the procedure well  and was reactive to endoscopy.   IMPRESSION:  1.  Normal esophagus status post passage of a 58-French Maloney dilator.  2.  Small hiatal hernia. Otherwise normal stomach, D1 and D2.   RECOMMENDATIONS:  Continue Aciphex 20 mg orally daily.   If she has future issues with dysphagia, she would need further evaluation.      Bridgette Habermann, M.D.  Electronically Signed     RMR/MEDQ  D:  04/11/2005  T:  04/11/2005  Job:  [id number]

## 2011-01-12 NOTE — Group Therapy Note (Signed)
[name], [name]                ACCOUNT NO.:  [id number]   MEDICAL RECORD NO.:  [id number]          PATIENT TYPE:  INP   LOCATION:  A206                          FACILITY:  [facility]   PHYSICIAN:  Edward L. Luan Pulling, M.D.DATE OF BIRTH:  17-Oct-1928   DATE OF PROCEDURE:  DATE OF DISCHARGE:  10/22/2004                                   PROGRESS NOTE   SUBJECTIVE:  [name] says she feels better than she did yesterday.  She  still has significant congestion and is still coughing.  She still has some  chest tightness, which is not as bad.   OBJECTIVE:  VITAL SIGNS:  Temperature is 96.8, pulse is 54, respirations 20.  Blood sugar is 226.  Blood pressure 149/82.  CHEST:  Clearer but she still has significant wheezing.  HEART:  Regular.  ABDOMEN:  Soft.  CARDIAC:  Panel negative for acute ischemia.   ASSESSMENT:  She has probably chronic obstructive pulmonary disease  exacerbation.  I have asked for a cardiology consultation.  We are trying to  get a better handle on that.  She has diabetes, which is not in terrible  control, but of course not optimum.  She is on a number of medications and  that is causing some problems.  She has hypertension, which is pretty good.  She has an exacerbation of her chronic obstructive pulmonary disease.   PLAN:  Continue with her treatments.  Continue with her medications.  Await  cardiology consultation as mentioned.  I do not believe this is a cardiac  cause of her problem but she did have a chest tightness and some symptoms  that could be consistent at least with congestive heart failure.      ELH/MEDQ  D:  10/22/2004  T:  10/23/2004  Job:  [id number]

## 2011-01-12 NOTE — H&P (Signed)
[name], [name]                ACCOUNT NO.:  [id number]   MEDICAL RECORD NO.:  [id number]          PATIENT TYPE:  INP   LOCATION:  A307                          FACILITY:  [facility]   PHYSICIAN:  Edward L. Luan Pulling, M.D.DATE OF BIRTH:  February 04, 1929   DATE OF ADMISSION:  04/22/2005  DATE OF DISCHARGE:  LH                                HISTORY & PHYSICAL   REASON FOR ADMISSION:  Hip pain.   HISTORY OF PRESENT ILLNESS:  [name] is a 77 year old who was admitted to  the hospital because of inability to walk.  She has had episodes of severe  right hip pain and has had several episodes of that.  She says it feels like  something gets caught.  She eventually is able to work her hip loose and is  able to ambulate.  Last night she developed severe pain in the hip and could  not get to the point that she could ambulate and this morning she is now  able to move her leg which she was not able to do last night.  She was  unable to bear any weight last night.  She has not had any falls, no trauma.  She actually says that she has had pain in her groin since about 10 years  ago after a cardiac catheterization.  She has had multiple admissions for  chronic obstructive pulmonary disease.   PAST MEDICAL HISTORY:  Positive for hypertension, chronic obstructive  pulmonary disease, diabetes, hiatal hernia, gastroesophageal reflux disease,  hyperlipidemia, degenerative joint disease.   PAST SURGICAL HISTORY:  She has had a tonsillectomy, cholecystectomy,  vaginal hysterectomy and appendectomy.   ALLERGIES:  She does not have any allergies.   CURRENT MEDICATIONS:  1.  Altace 5 mg daily.  2.  Glyburide 5 mg, two in the morning, one at bedtime.  3.  Metformin 500 mg daily.  4.  AcipHex 20 mg daily.  5.  Xanax 0.5 mg q.i.d. PRN.  6.  Fluconazole 100 mg daily.  7.  Xalatan eye drops at bedtime.  8.  She takes albuterol and Atrovent nebulizer treatments four times a day.   FAMILY HISTORY:  Positive  for diabetes, hypertension and chronic obstructive  pulmonary disease.   SOCIAL HISTORY:  She lives next door to family.  She is a nonsmoker and she  does not drink any alcohol.   REVIEW OF SYSTEMS:  As mentioned, she denies any falls.  She has not had any  numbness in the legs.  She has had no problems with incontinence of urine or  stool.   PHYSICAL EXAMINATION:  GENERAL APPEARANCE:  Exam shows she is a well-  developed, well-nourished, moderately obese female who appears to be mildly  short of breath.  VITAL SIGNS:  Temperature 97.4, pulse 66, respirations 16, blood sugar 247,  blood pressure 120/80. Height is 64 inches.  She could not get weighed yet  because she wasn't able to stand.  HEENT: Her mucous membranes are slightly dry. Her pupils equal, round,  reactive to light and accommodation.  Tympanic membranes are intact.  NECK:  Supple without masses.  She doesn't have any bruits.  No jugular  venous distention.  CHEST:  Shows decreased breath sounds but fairly clear.  HEART:  Regular with distant heart sounds.  I don't hear a gallop or murmur  ABDOMEN:  Soft without masses.  EXTREMITIES:  Show no edema.  She has pain in the right groin just at the  canal.  She has no change in sensation in the leg.  She can move her leg  now.  I did not check her reflexes.  CNS: Otherwise normal.   ASSESSMENT AND PLAN:  She has right hip pain which is of unknown cause.  I  have discussed her situation with Dr. Aline Brochure and asked her to consult on  her case.  We are going to plan to go ahead with an MRI of the lumbar spine  and right hip.  In the meantime, she is going to continue with her nebulizer  treatments, continue with treatment for her diabetes and will stop her oral  medications and put her on a sliding scale.  Her oral medications may be  confused because  she has, she says glyburide and metformin but lists the  metformin dose at 5/500 which sounds like that may actually be  Glucovance so  I am going to get her actual medications and look at those and be sure that  they are correct.  In the meantime she has pain medication ordered and a  physical therapy consultation has been ordered.      Edward L. Luan Pulling, M.D.  Electronically Signed     ELH/MEDQ  D:  04/23/2005  T:  04/23/2005  Job:  [id number]

## 2011-01-12 NOTE — Discharge Summary (Signed)
[name], [name]                ACCOUNT NO.:  [id number]   MEDICAL RECORD NO.:  [id number]          PATIENT TYPE:  INP   LOCATION:  A307                          FACILITY:  [facility]   PHYSICIAN:  Edward L. Luan Pulling, M.D.DATE OF BIRTH:  [birth year]   DATE OF ADMISSION:  04/22/2005  DATE OF DISCHARGE:  08/29/2006LH                                 DISCHARGE SUMMARY   FINAL DISCHARGE DIAGNOSES:  1.  Hip pain with severe osteoarthritis.  2.  Chronic obstructive pulmonary disease.  3.  Hypertension.  4.  Diabetes.  5.  Gastroesophageal reflux disease.  6.  Hyperlipidemia.   HISTORY OF PRESENT ILLNESS:  [name] is a 77 year old who came to the  hospital because of inability to walk.  She has had episodes of severe right  hip pain.  She says it feels like something gets caught in her hip.  Normally, she is eventually able to work her hip loose and is able to  ambulate.  But, when it happened on the night of admission, she developed  severe pain and could not ambulate and came to the emergency room.  She was  found to be unable to ambulate and was brought in because of that.   PHYSICAL EXAMINATION:  GENERAL APPEARANCE:  Moderately obese female and  mildly short of breath.  VITAL SIGNS:  Temperature 97.4, pulse 66, respirations 16, blood sugar 247,  blood pressure 120/80.  Significant findings showed that she had decreased  breath sounds in her chest, but fairly clear, her extremities showed no  edema with pain in the right groin just at the canal.  She had no change in  sensation of her legs.  She was able to move her leg at the time that I saw  her.   HOSPITAL COURSE:  She underwent MRI of the lumbar spine and of the hip.  Had  consultation with Dr. Aline Brochure, was started on sliding scale insulin,  continued on her other medications with the exception of her blood sugar  medications.  After Dr. Aline Brochure reviewed her MRI, he felt that she needed a  hip replacement.  She is improved by  the time of discharge, and is  discharged home to follow up with Dr. Aline Brochure in about a week.  To discuss  and schedule surgery at that time.  She is going to be on Altace 5 mg daily,  Glyburide/Metformin two in the morning, one at bedtime, Aciphex 20 mg daily,  Xanax 0.5 mg q.i.d. p.r.n., Fluconazole 100 mg daily, Xalatan eye drops at  bedtime, Albuterol and Atrovent nebulizer treatment q.i.d. and Vicodin 5/500  q.i.d. p.r.n. pain.      Edward L. Luan Pulling, M.D.  Electronically Signed     ELH/MEDQ  D:  04/24/2005  T:  04/24/2005  Job:  [id number]

## 2011-01-12 NOTE — H&P (Signed)
NAME:  [name], [name]                          ACCOUNT NO.:  [id number]   MEDICAL RECORD NO.:  [id number]                   PATIENT TYPE:  EMS   LOCATION:  ED                                   FACILITY:  [facility]   PHYSICIAN:  Baxter Hire, M.D.              DATE OF BIRTH:  10-27-1928   DATE OF ADMISSION:  05/10/2002  DATE OF DISCHARGE:                                HISTORY & PHYSICAL   CHIEF COMPLAINT:  Cough and weakness.   HISTORY OF PRESENT ILLNESS:  This is a 77 year old obese white female who  presents with weakness and cough productive of green sputum since Friday.  Since then she has had a poor p.o. intake.  She saw her primary care M.D.  and was prescribed cefalexin but since then she has not felt any better and  has become progressively weaker.   PAST MEDICAL HISTORY:  1. COPD.  2. Chronic headache.  3. Non-insulin-dependent diabetes.  4. GERD.  5. Obesity.  6. History of right shoulder surgery in June 2003.   ALLERGIES:  No known drug allergies.   CURRENT MEDICATIONS:  1. Glucovance 5/500 two q.a.m. and one q.p.m.  2. Protonix 40 mg q.d.   SOCIAL HISTORY:  Does not smoke, does not drink any alcohol.  She is  divorced, has five children, and lives alone.   FAMILY HISTORY:  Mother died in an MVA at age 85.  Father died of bone  cancer at age 34.   REVIEW OF SYSTEMS:  As per HPI.  She has nausea, joint pain.  All other  systems are reviewed and are normal.   VITAL SIGNS:  Temperature 99.3, pulse 82, respirations 22, blood pressure  142/80, O2 saturation 95% on room air.   PHYSICAL EXAMINATION:  GENERAL:  This is an obese white female who appears  tired.  HEENT:  Pupils equal, round, and reactive to light.  Extraocular movements  intact. Oral mucosa is dry.  Oropharynx is clear.  CARDIOVASCULAR:  Regular rate and rhythm, no murmurs.  LUNGS:  There are coarse breath sounds, scattered rhonchi throughout.  ABDOMEN:  Obese, nontender, nondistended.  Bowel  sounds are positive.  EXTREMITIES:  There is trace edema in the lower extremities.  NEUROLOGIC:  Cranial nerves II-XII grossly intact.  No focal deficits.  Strength 5/5 bilateral.  SKIN:  Moist with no rashes.   ADMISSION LABORATORY DATA:  White blood cells 13.4 with ANC 11.1, hemoglobin  14.3, platelets 356.  Sodium 139, potassium 4.2, chloride 101, CO2 31, BUN  21, creatinine 1.0, glucose 181.  UA is clean.   Chest x-ray shows shaggy heart border on the left which could represent a  left lower lobe infiltrate.   ASSESSMENT AND PLAN:  1. Pneumonia.  Will start IV Rocephin and Zithromax.  Will also give her     some nebulizers.  Will check blood cultures and sputum cultures.  2. Dehydration.  Will rehydrate with normal saline.  3. Non-insulin-dependent diabetes.  Will continue her oral medications and     add sliding scale insulin as needed.                                               Baxter Hire, M.D.    JDJ/MEDQ  D:  05/10/2002  T:  05/11/2002  Job:  [id number]

## 2011-01-12 NOTE — Discharge Summary (Signed)
[name], [name]                ACCOUNT NO.:  [id number]   MEDICAL RECORD NO.:  [id number]          PATIENT TYPE:  INP   LOCATION:  [id number]                          FACILITY:  [facility]   PHYSICIAN:  Edward L. Luan Pulling, M.D.DATE OF BIRTH:  17-Sep-1928   DATE OF ADMISSION:  02/13/2005  DATE OF DISCHARGE:  06/24/2006LH                                 DISCHARGE SUMMARY   FINAL DISCHARGE DIAGNOSES:  1.  Chronic obstructive pulmonary disease with exacerbation.  2.  Diabetes.  3.  Hypertension.  4.  Hiatal hernia disease.  5.  Gastroesophageal reflux disease.  6.  Hyperlipidemia.  7.  Degenerative joint disease.  8.  Noncardiac chest pain.   HISTORY:  This is a 77 year old who had increasing shortness of breath for  about the last week or two. She has not had any pain but does have a sharp  pain in her lower chest. This does not have typical characteristics of  angina pectoris. Cardiac catheterization done in 2003 was normal. Physical  examination on admission shows blood pressure 158/82, she was afebrile,  respirations 22, chest showed decreased breath sounds, heart regular,  abdomen with decreased breath sounds, no wheezing, heart was regular, her  abdomen was soft, extremities showed no edema. Chest x-ray did not show any  acute infiltrates. Her lab work was negative for cardiac disease or for  acute infarction, BNP was normal, CBC showed white count of 8100.   HOSPITAL COURSE:  She was started on intravenous steroids, antibiotics,  inhaled bronchodilators and improved. By the time of discharge she was much  improved and is discharged home to continue with her regular medications  including her nebulizer with albuterol and Atrovent, she is going to take  prednisone 40 mg x3 days, 30 x3 days, 20 x3 days, 10 x3 days and then stop,  Levaquin 500 mg daily x7 days, Altace 10 mg daily, Naproxen 500 mg daily,  glyburide 10 mg b.i.d. and Aciphex 20 mg daily. She is to follow up in my   office.       ELH/MEDQ  D:  02/17/2005  T:  02/17/2005  Job:  [id number]

## 2011-01-12 NOTE — Group Therapy Note (Signed)
[name], BATDORF                ACCOUNT NO.:  [id number]   MEDICAL RECORD NO.:  [id number]          PATIENT TYPE:  INP   LOCATION:  A307                          FACILITY:  [facility]   PHYSICIAN:  Edward L. Luan Pulling, M.D.DATE OF BIRTH:  10-24-1928   DATE OF PROCEDURE:  04/24/2005  DATE OF DISCHARGE:                                   PROGRESS NOTE   PROBLEM LIST:  1.  Chronic obstructive pulmonary disease.  2.  Hip pain.  3.  Diabetes.  4.  Obesity.   SUBJECTIVE:  [name] says she feels much better.  She has no new  complaints.  Dr. Aline Brochure has seen her this morning and after his review of  the MRI scan feels that she needs to have a hip replacement.  There is not  an official report as yet on the MRI.   OBJECTIVE:  GENERAL:  Her exam this morning shows that she looks much more  comfortable.  VITAL SIGNS:  Temperature is 98.9, pulse 63, respirations 20, blood sugar  was 188 and as high as 218, blood pressure 119/59.  CHEST:  Her chest is clear.  O2 saturation is 100% on 2 L.  HEART:  Her heart is regular.  ABDOMEN:  Soft without masses.  EXTREMITIES:  No edema.   ASSESSMENT:  She has problems with her hip and probably is going to require  replacement.   PLAN:  Discharge her today.  She is going to have follow-up with Dr.  Aline Brochure and plan for total hip replacement.      Edward L. Luan Pulling, M.D.  Electronically Signed     ELH/MEDQ  D:  04/24/2005  T:  04/24/2005  Job:  [id number]

## 2011-01-12 NOTE — Consult Note (Signed)
[name], [name] NO.:  [id number]   MEDICAL RECORD NO.:  [id number]          PATIENT TYPE:  INP   LOCATION:  A206                          FACILITY:  [facility]   PHYSICIAN:  [name], M.D.   DATE OF BIRTH:  February 12, 1929   DATE OF CONSULTATION:  DATE OF DISCHARGE:                                   CONSULTATION   PRIMARY CARE PHYSICIAN:  Dr. Luan Pulling.   CARDIOLOGIST:  [name], M.D.   DATE OF CONSULTATION:  October 20, 2004.   HISTORY OF PRESENT ILLNESS:  [name] is a 77 year old woman with no  known coronary artery disease but frequent atypical chest discomfort, who  has had both a heart catheterization and a Cardiolite stress test, both of  which showed no evidence of ischemia or coronary disease.  She has had  bronchitis for about 2 weeks, treated with Levaquin without significant  success.  She felt very poorly in the last day or so.  She has been having  episodic shortness of breath associated with some PND and orthopnea and some  discomfort in the center of her chest, which she describes as an ache.  It  comes and lasts about 1-2 minutes, associated with coughing usually and  resolves spontaneously.  She denies any lower extremity edema.  She states  that she is actually feeling better this morning.   PAST MEDICAL HISTORY:  Significant for discomfort in her chest.  She has had  a heart catheterization in 1999 which showed normal coronaries, normal LV  function and an adenosine Cardiolite which was performed in August of 2004  which showed no evidence of ischemia and normal ejection fraction.  She has  hypertension, diabetes and hyperlipidemia as well as COPD, chronic  headaches, obesity and gastroesophageal reflux disease.  She has had a  cholecystectomy.  She has had a bladder procedure.  She has had a  hysterectomy, and she has had shoulder surgery.  She lives in [place] by  herself.  Her daughter lives right next door to her.  She is  divorced.  She  has five children.  She does not smoke, drink or use illicit drugs.   MEDICATIONS PRIOR TO ADMISSION:  1.  Altace 5 mg once a day.  2.  Glipizide at an unknown dose.  3.  Aciphex.  4.  Naprosyn.  5.  Levaquin.  6.  A cough syrup called guaifenesin which gives her a headache.  7.  She had previously been on Vytorin but stopped that a month ago because      of leg pain.   Currently in the hospital she is on:  1.  Zithromax 250 once a day.  2.  Rocephin 1 gm IV q.d.  3.  Lovenox 40 mg subcu once a day.  4.  Guaifenesin.  5.  Sliding scale insulin.  6.  Albuterol and Atrovent nebulizers.  7.  Protonix 40 mg a day.  8.  Prednisone 40 mg a day.   FAMILY HISTORY:  Her mother died at age 20 of a motor vehicle accident.  The  father died at age 34 of bone cancer.  Her children are healthy.  Her  siblings do not have coronary disease.   REVIEW OF SYSTEMS:  Generally positive with multiple various symptoms  including fevers, nasal discharge, urinary frequency, numbness in her legs,  nausea, vomiting, gastroesophageal reflux symptoms, abdominal pain, none of  which are consistent nor localizing.   PHYSICAL EXAMINATION:  GENERAL APPEARANCE:  She is a well-developed, well-  nourished, moderately obese white female in no apparent distress.  She is  alert and oriented times four.  VITAL SIGNS:  Her blood pressure is 149/82, pulse 49, respiratory rate 18.  She is saturating 96% on 2 L nasal cannula.  She is afebrile.  She weighs  212 pounds.  HEENT:  Examination of the head, eyes, ears, nose and throat is  unremarkable.  NECK:  Supple.  There is no jugular venous distention or carotid bruit.  CHEST:  Clear to auscultation bilaterally with good air movement.  CARDIOVASCULAR:  Regular.  She has a 2/6 murmur that is heard best at the  upper right sternal border.  SKIN:  Without rashes.  BREAST:  A complete breast exam was deferred.  ABDOMEN:  She has an obese, soft,  nontender abdomen.  GU:  Exam deferred.  RECTAL:  Exam deferred.  EXTREMITIES:  Her lower extremities are without clubbing, cyanosis or edema.  She has 2+ pulses throughout her lower extremities.  MUSCULOSKELETAL:  Nonfocal.  NEUROLOGIC:  Nonfocal.   Chest x-ray shows cardiomegaly without any acute abnormalities.   Electrocardiogram shows sinus rhythm at a rate of 60 with normal intervals,  normal axes, nonspecific ST/T wave changes.   LABORATORY:  White blood cell count 7.1, H&H 12 and 34, platelet count 377.  Sodium 137, potassium 3.7, chloride 107, bicarb 25, BUN 21, creatinine 1.1.  Her blood sugar is 122.  A single set of cardiac enzymes is not consistent  with acute myocardial infarction.  She headache point of care enzymes times  two which are negative.  Her B-type natriuretic peptide is 60.   This is a woman with COPD who appears to have had a COPD exacerbation.  She  has chest discomfort which is very atypical for coronary disease and likely  secondary to her bronchitis.  She has dyspnea, which is also probably  secondary to her bronchitis.  Her hypertension is suboptimally controlled,  and we will put her back on her Altace dose.  Her lipids are obviously  elevated.  We will check those.  I think I am going to restart her back on  Zocor.  We  will check an echocardiogram to ensure the left ventricular systolic  function is normal with the cardiomegaly on chest x-ray.  Otherwise we will  cycle her enzymes, and we will direct our diagnostic workup as per the  results of this.      JH/MEDQ  D:  10/20/2004  T:  10/20/2004  Job:  [id number]

## 2011-01-12 NOTE — Group Therapy Note (Signed)
NAMECYNAI, [name]                ACCOUNT NO.:  [id number]   MEDICAL RECORD NO.:  [id number]          PATIENT TYPE:  INP   LOCATION:  [id number]                          FACILITY:  [facility]   PHYSICIAN:  Edward L. Luan Pulling, M.D.DATE OF BIRTH:  Jan 01, 1929   DATE OF PROCEDURE:  02/16/2005  DATE OF DISCHARGE:                                   PROGRESS NOTE   PROBLEM:  Chronic obstructive pulmonary disease.   SUBJECTIVE:  [name] says she is doing a little better. She still has  some cough, but she is not as short of breath. She has some chest pain still  which I believe is related to her COPD.   OBJECTIVE:  VITAL SIGNS:  Her exam shows her temperature is 96.9, pulse 82,  respirations 18, blood sugar 108, blood pressure 136/80, O2 saturation is  96% on 2 liters.  CHEST:  Her chest is clear with decreased breath sounds.  HEART:  Regular.   ASSESSMENT:  She is better.   PLAN:  Plan is to continue her treatment. Her sugar is better. Will continue  with other medications and follow.       ELH/MEDQ  D:  02/16/2005  T:  02/16/2005  Job:  [id number]

## 2011-01-12 NOTE — Group Therapy Note (Signed)
NAMECHARMAGNE, FJELD                ACCOUNT NO.:  [id number]   MEDICAL RECORD NO.:  [id number]          PATIENT TYPE:  INP   LOCATION:  [id number]                          FACILITY:  [facility]   PHYSICIAN:  Edward L. Luan Pulling, M.D.DATE OF BIRTH:  07/10/1929   DATE OF PROCEDURE:  06/23/2005  DATE OF DISCHARGE:                                   PROGRESS NOTE   PROBLEM:  Confusion.   SUBJECTIVE:  Ms. Gildner apparently has been a little bit better with the  confusion since she has been off the Dilaudid.  She has been still somewhat  confused this morning.  She is sleepy.   OBJECTIVE:  VITAL SIGNS:  Her physical exam today shows her temperature is  97.5, pulse 75, respirations 20, blood sugar between 60 and 96, blood  pressure 123/81, O2 saturations 94% on room air.  CHEST:  Her chest is clear.  HEART:  Her heart regular.   Her sodium level is up to 135 with a potassium of 5.3, BUN is better at 47,  creatinine better at 2.5, so I think that is improving.   ASSESSMENT:  My assessment is that she is improving as far as her  electrolytes are concerned.  I think she is better as far as her mental  status in that I am going to go ahead and stop her oral medications for  diabetes, put her on sliding scale coverage and then decide what to do from  there.      Edward L. Luan Pulling, M.D.  Electronically Signed     ELH/MEDQ  D:  06/23/2005  T:  06/23/2005  Job:  [id number]

## 2011-01-12 NOTE — Procedures (Signed)
NAMELATIERRA, PULLIS NO.:  [id number]   MEDICAL RECORD NO.:  [id number]          PATIENT TYPE:  INP   LOCATION:  A206                          FACILITY:  [facility]   PHYSICIAN:  Scarlett Presto, M.D.   DATE OF BIRTH:  05/11/1929   DATE OF PROCEDURE:  10/20/2004  DATE OF DISCHARGE:                                  ECHOCARDIOGRAM   TAPE NUMBER:  [id number].   TAPE COUNT:  [id number] through [id number].   This is a 77 year old woman with shortness of breath. Technical quality  study is adequate.   M-MODE TRACING:  Aorta is 38 mm.   Left atrium is 42 mm.   Septum is 15 mm.   Posterior wall is 13 mm.   Left ventricular diastolic dimension is 45 mm.   Left ventricular systolic dimension is 20 mm.   TWO-D AND DOPPLER IMAGING:  The left ventricle is normal size with normal  systolic function. Estimated ejection fraction 65-70%. There are no wall  motion abnormalities seen. There is mild concentric left ventricular  hypertrophy.   The right ventricle is top normal in size. There is a question of right  ventricular free wall hypertrophy, though this was not adequately assessed.   The atria are both enlarged.   There is no pericardial effusion seen.   The aortic valve is sclerotic with no stenosis or regurgitation.   There is no mitral regurgitation seen.   The tricuspid valve has trace regurgitation.   Pulmonic valve not well seen.   No pericardial effusion.   The inferior vena cava and the ascending aorta were not well seen.      JH/MEDQ  D:  10/20/2004  T:  10/20/2004  Job:  [id number]   cc:   Luan Pulling, M.D.

## 2011-01-12 NOTE — Group Therapy Note (Signed)
NAMEEQUILLA, PYLAND                ACCOUNT NO.:  [id number]   MEDICAL RECORD NO.:  [id number]          PATIENT TYPE:  INP   LOCATION:  [id number]                          FACILITY:  [facility]   PHYSICIAN:  Edward L. Luan Pulling, M.D.DATE OF BIRTH:  [birth year]   DATE OF PROCEDURE:  06/24/2005  DATE OF DISCHARGE:                                   PROGRESS NOTE   SUBJECTIVE:  [name] is much improved as far as her confusion is  concerned. She has no new complaints.   OBJECTIVE:  CHEST:  Her exam shows her chest is much clearer.  GENERAL:  She is awake and alert.  VITAL SIGNS:  Her temperature is 97.1, pulse 71, respirations 20, blood  sugar was 64, blood pressure 132/71, O2 saturation 96% on 2 liters.   Her sodium is up to 138 now, BUN is 38, creatinine 1.8; all that looks much  improved.   ASSESSMENT:  She is much better. Her renal function better. She has had some  hypoglycemia, however.   PLAN:  My plan then is for her to continue with her treatments medications,  no changes and she is being readied for perhaps going to rehab.      Edward L. Luan Pulling, M.D.  Electronically Signed     ELH/MEDQ  D:  06/24/2005  T:  06/25/2005  Job:  [id number]

## 2011-01-12 NOTE — Discharge Summary (Signed)
Michaela, Vazquez NO.:  [id number]   MEDICAL RECORD NO.:  [id number]          PATIENT TYPE:  INP   LOCATION:  [id number]                          FACILITY:  [facility]   PHYSICIAN:  Carole Civil, M.D.DATE OF BIRTH:  10-12-1928   DATE OF ADMISSION:  06/19/2005  DATE OF DISCHARGE:  10/31/2006LH                                 DISCHARGE SUMMARY   HISTORY OF PRESENT ILLNESS:  This is a 77 year old female who had catching  and locking with pain in the right groin.  She was worked up with an MRI of  the spine and hip as well as plain radiographs.  She did have some mild  degenerative disc disease and spinal stenosis in the lower lumbar segments,  but no evidence of ruptured disc or spinal root impingement.  She was  diagnosed with primarily osteoarthritis of the right and presented for right  total hip replacement.   ADMISSION DIAGNOSIS:  Osteoarthritis, right hip.   DISCHARGE DIAGNOSES:  1.  Osteoarthritis, right hip.  2.  Postoperative anemia.  3.  Postoperative confusion.  4.  Hypoglycemia.   PROCEDURE:  On June 19, 2005, she underwent an uncomplicated right total  hip replacement with a DePuy 14 press fit Corail stem +1 neck length with a  32 mm metal head.  She tolerated that well and went back to the floor.   HOSPITAL COURSE:  During the postop period, the first 2 days she had no  problems at all and then on postop day #3, developed confusion and that  persisted for approximately 2 days.  That was treated with maximizing her  oxygenation and stopping her narcotic pain medications.  By postop day #5,  she had recovered from this and was doing well except for some postoperative  anemia.  She received a total of 4 units of blood postoperative.  The last  hemoglobin was 10.   Her incision looked fine.  She is afebrile today.  She is awake and alert  back to her baseline mental status.  She is ambulating with physical  therapy, standby assist approximately  15 feet.  She has equal leg length  with no peripheral edema and some thigh swelling. We did hold her Lovenox on  October 30.   She is on a sliding insulin scale due to the hypoglycemia which is thought  to be primarily caused by her lack of appetite.   DISPOSITION:  She will be discharged to [facility].  She is to have her staples taken out on November 4.   ACTIVITY:  Physical therapy with weightbearing as tolerated.  Anterior hip  approach precautions.  Abduction pillow for the balance of 6 weeks from  surgery.   DISCHARGE MEDICATIONS:  1.  Albuterol 2.5 mg inhaler q.i.d.  2.  Dulcolax suppository 10 mg PR daily as needed.  3.  Colace 100 mg p.o. daily.  4.  Lovenox 40 mg subcu daily, restart November 2, continue for the balance      of 28 days from surgery.  5.  Sliding insulin scale.  6.  Atrovent 0.5 mg inhaler q.i.d.  7.  Xopenex 1.25 mg inhaler q.8h. p.r.n.  8.  Protonix 40 mg b.i.d.  9.  Senokot one tablet p.o. b.i.d. p.r.n.  10. Tylenol 1000 mg q.6h. for pain.  11. Xanax 0.5 mg p.o. t.i.d. p.r.n.  12. Lasix 20 mg daily p.r.n. peripheral edema or signs of congestive heart      failure.   FOLLOW UP:  Follow up with Dr. Aline Brochure in 1 month.  Dr. Ruthe Mannan office  phone number, [phone] or [phone].  Hospital phone number for [facility], [phone].      Carole Civil, M.D.  Electronically Signed     SEH/MEDQ  D:  06/26/2005  T:  06/26/2005  Job:  [id number]

## 2011-01-12 NOTE — Group Therapy Note (Signed)
   NAME:  [name], [name]                          ACCOUNT NO.:  [id number]   MEDICAL RECORD NO.:  [id number]                   PATIENT TYPE:  INP   LOCATION:  A326                                 FACILITY:  [facility]   PHYSICIAN:  Alonza Bogus, M.D.              DATE OF BIRTH:  Jan 22, 1929   DATE OF PROCEDURE:  DATE OF DISCHARGE:                                   PROGRESS NOTE   PROBLEMS:  1. Pneumonia.  2. Diabetes.   SUBJECTIVE:  The patient says she is feeling better today but still coughing  a lot.  She is not coughing much up.  She denies any other new complaints.   PHYSICAL EXAMINATION:  CHEST:  Pretty clear.  She still has some rhonchi and  minimal wheeze.  VITAL SIGNS:  She is afebrile.  EXTREMITIES:  No edema.  CENTRAL NEVOUS SYSTEM:  Grossly intact.   LABORATORY DATA:  Her blood sugars are generally less than 200 and mostly  less than 150.   ASSESSMENT:  She is better.   PLAN:  The plan is to continue on treatments, continue on medications.  No  changes in antibiotics, etc., now.  I will try to get her up moving around a  little bit more.                                               Alonza Bogus, M.D.    ELH/MEDQ  D:  05/13/2002  T:  05/14/2002  Job:  [id number]

## 2011-01-12 NOTE — Group Therapy Note (Signed)
NAMEKAMILI, NEVILS                ACCOUNT NO.:  [id number]   MEDICAL RECORD NO.:  [id number]           PATIENT TYPE:   LOCATION:                                 FACILITY:   PHYSICIAN:  Edward L. Luan Pulling, M.D.     DATE OF BIRTH:   DATE OF PROCEDURE:  10/22/2004  DATE OF DISCHARGE:                                   PROGRESS NOTE   SUBJECTIVE:  Ms. Gnau says she feels much better.  She has no new  complains.   OBJECTIVE:  VITAL SIGNS:  Temperature is 97, pulse 50, respirations 20.  Blood sugar 118.  Blood pressure 155/91.  O2 saturation is 96% on 2 L.  CHEST:  Very clear with decreased breath sounds.  HEART:  Regular.  ABDOMEN:  Soft.  I&O -3.   ASSESSMENT:  She is better.   PLAN:  She is ready for discharge.  I plan to discharge her home today.  Please see discharge summary for details.      ELH/MEDQ  D:  10/22/2004  T:  10/23/2004  Job:  [id number]

## 2011-01-12 NOTE — Group Therapy Note (Signed)
[name], [name]                ACCOUNT NO.:  [id number]   MEDICAL RECORD NO.:  [id number]          PATIENT TYPE:  INP   LOCATION:  [id number]                          FACILITY:  [facility]   PHYSICIAN:  Edward L. Luan Pulling, M.D.DATE OF BIRTH:  [birth year]   DATE OF PROCEDURE:  06/25/2005  DATE OF DISCHARGE:                                   PROGRESS NOTE   SUBJECTIVE:  [name] says she is feeling much better. She is more alert.  She is oriented and not agitated. She has been getting up some. She is  concerned about some swelling of her leg.   OBJECTIVE:  VITAL SIGNS:  Her physical exam now shows her temperature is  98.7, pulse 77, respirations 20, blood sugar 163 - it has been 164 and 144,  blood pressure 154/87, O2 saturation  98% on 2 liters.  CHEST:  Her chest is much clearer.  CENTRAL NERVOUS SYSTEM:  Back to baseline.   Her white count this morning is 7,300, hemoglobin is 10.3, platelets 364.  Her sodium is 133, potassium 4.8, BUN 32, creatinine 1.7, all improved.   ASSESSMENT/PLAN:  She does have some swelling of the leg, but I have set her  up to have Dr. Aline Brochure evaluate that. I think it is normal postop but needs  evaluation by the surgeon.      Edward L. Luan Pulling, M.D.  Electronically Signed     ELH/MEDQ  D:  06/25/2005  T:  06/25/2005  Job:  [id number]

## 2011-01-12 NOTE — Group Therapy Note (Signed)
   NAME:  [name], [name]                          ACCOUNT NO.:  [id number]   MEDICAL RECORD NO.:  [id number]                   PATIENT TYPE:  INP   LOCATION:  A326                                 FACILITY:  [facility]   PHYSICIAN:  Angus G. Everette Rank, M.D.              DATE OF BIRTH:  1929/04/20   DATE OF PROCEDURE:  DATE OF DISCHARGE:  05/18/2002                                   PROGRESS NOTE   SUBJECTIVE:  This patient remains afebrile.  She continues to cough  intermittently.  She has been hospitalized with pneumonia, dehydration, non-  insulin dependent diabetes   OBJECTIVE:  VITAL SIGNS:  Blood pressure 169/67, respirations 20, pulse 57,  temperature 98.9 degrees.  LUNGS:  Expiratory wheezes.  HEART:  Regular rhythm.  ABDOMEN:  No palpable organs or masses.   ASSESSMENT:  The patient has been treated and hospitalized for treatment of  bronchopneumonia, dehydration, non-insulin dependent diabetes.   PLAN:  Plan to continue current regimen.                                               Angus G. Everette Rank, M.D.    AGM/MEDQ  D:  05/17/2002  T:  05/19/2002  Job:  [id number]

## 2011-01-12 NOTE — Procedures (Signed)
NAMEBREES, [name] NO.:  [id number]   MEDICAL RECORD NO.:  [id number]          PATIENT TYPE:  OUT   LOCATION:  RAD                           FACILITY:  [facility]   PHYSICIAN:  Leslye Peer, MD       DATE OF BIRTH:  1928-09-27   DATE OF PROCEDURE:  DATE OF DISCHARGE:                                  ECHOCARDIOGRAM   REFERRING PHYSICIAN:  Dr. Luan Pulling.   INDICATION:  A 77 year old female with past medical history of hypertension,  diabetes and COPD, referred for evaluation of LV function.   The technical quality of the study is limited secondary to patient body  habitus and poor acoustic windows.   The aorta appears to be within normal limits, measured at 3.6 cm.   Left atrium also appears to be within normal limits, measured at 3.6 cm.  The patient did appear to be in sinus rhythm during the procedure.   The interventricular septum and posterior wall are mildly thickened.   The aortic valve is not well visualized but is probably a trileaflet valve.  Overall leaflet excursion appears to be normal.  No significant aortic  insufficiency is noted.  Doppler interrogation of the aortic valve is within  normal limits.   The mitral valve appears grossly structurally normal.  No mitral valve  prolapse is noted.  No obvious mitral regurgitation is noted, but color-flow  Doppler is somewhat suboptimal.  Doppler interrogation of the mitral valve  is within normal limits.   The pulmonic valve is not well visualized.   The tricuspid valve appears grossly structurally normal but is also poorly  visualized.  Mild tricuspid regurgitation is noted.   The left ventricle is normal in size with the LVIDd measured at 4.3 cm and  the LVISd measured at 2.8 cm.  Overall left ventricular systolic function is  normal, and no obvious regional wall motion abnormalities are appreciated.  The presence of diastolic dysfunction is inferred from pulse-wave Doppler  across the mitral  valve.  The inner atrial septum appears aneurysmal.  Color  flow is not adequate on the study to assess for PFO or ASD.   The right atrium is not well visualized.  The right ventricle is dilated but  with preserved right ventricular systolic function.  In one view only  (apical 4), there is an echogenic density located in the apex of the right  ventricle which may represent a moderator band.  However, the possibility of  a thrombus cannot be entirely excluded, or RVH could also be another  etiology.  However, the wall motion in this area appears to be preserved,  which would argue against development of a clot.  This is not appreciated in  any other view, and artifact is also a consideration.   IMPRESSION:  1.  Mild to moderate concentric left ventricular hypertrophy.  2.  Mild tricuspid regurgitation.  3.  Normal left ventricular size and systolic function, without regional      wall motion abnormality noted.  4.  The presence of diastolic dysfunction is inferred from pulse-wave  Doppler across the mitral valve.  5.  Interatrial septal aneurysm - unable to assess for patent foramen ovale      or atrial septal defect on this study.  6.  Right ventricular dilatation, with preserved right ventricular systolic      function.  7.  Echodensity noted in the apex of the right ventricle - possible      etiologies include moderator band, artifact, right ventricular      hypertrophy or thrombus.  The presence of preserved wall motion in this      area argues against the development of clot.  This is appreciated in one      view only           ______________________________  Leslye Peer, MD     AB/MEDQ  D:  05/22/2005  T:  05/23/2005  Job:  [id number]   cc:   Percell Miller L. Luan Pulling, M.D.  Fax: [phone]

## 2011-01-12 NOTE — Group Therapy Note (Signed)
NAMEJAQULYN, SKURKA                ACCOUNT NO.:  [id number]   MEDICAL RECORD NO.:  [id number]          PATIENT TYPE:  INP   LOCATION:  [id number]                          FACILITY:  [facility]   PHYSICIAN:  Edward L. Luan Pulling, M.D.DATE OF BIRTH:  [birth year]   DATE OF PROCEDURE:  02/17/2005  DATE OF DISCHARGE:  02/17/2005                                   PROGRESS NOTE   PROBLEM:  COPD with exacerbation, stiffness.   [name] says she is better. She has no new complaints. Her IV has  infiltrated. She is on oral medications and she says she wants to go home.   Exam shows that her chest is much clearer, her heart is regular, her abdomen  is soft, extremities show no edema.   My assessment is that she is much improved.   Plan is for discharge home. Please see discharge summary for details.       ELH/MEDQ  D:  02/17/2005  T:  02/17/2005  Job:  [id number]

## 2011-01-12 NOTE — Op Note (Signed)
NAMEKIKUKO, TRESNER NO.:  [id number]   MEDICAL RECORD NO.:  [id number]          PATIENT TYPE:  INP   LOCATION:  [id number]                          FACILITY:  [facility]   PHYSICIAN:  Carole Civil, M.D.DATE OF BIRTH:  1929-06-22   DATE OF PROCEDURE:  06/19/2005  DATE OF DISCHARGE:                                 OPERATIVE REPORT   HISTORY:  A 77 year old female had catching, locking and pain in the right  groin who was worked up with an MRI of the spine and hip as well as plain  radiographs. She does have some degenerative disk disease and spinal  stenosis of the lower lumbar spine segments, L3 through S1, but no evidence  of ruptured disk or pressure on any of the right nerve roots. She does have  osteoarthritis of the right hip, and workup revealed this was the primary  source of her right groin pain.   PREOPERATIVE DIAGNOSIS:  Osteoarthritis of the right hip.   POSTOPERATIVE DIAGNOSIS:  Osteoarthritis right hip.   PROCEDURE:  Right total hip replacement.   IMPLANTS USED:  DePuy 14 Press-Fit Corail stem + 1 neck length with a 32-mm  head (metal). We also inserted a subcu pain pump.   ASSISTANTS:  Corrie Dandy; scrub tech was [place] ____________.   ANESTHESIA:  Spinal anesthetic was used.   BLOOD LOSS:  Blood loss was estimated at 400 cc.   OPERATIVE FINDINGS:  Osteoarthritis of the right hip.   DETAILS OF PROCEDURE:  The patient was seen in the preop holding area.  History and physical was updated. Her right hip was signed by the patient  and countersigned by the surgeon. Antibiotics were started, and after  appropriate history and physical update, the patient was taken to the  operating room for spinal anesthetic. She was then placed in a lateral  decubitus position, right side up. Padding was placed in the axilla and the  lower extremities. She was placed in a hip holder, and her right leg was  prepped and draped using sterile technique. At this  time, we took a time-out  and proceeded with an anterior approach to the hip.   The skin was incised over the greater trochanter, extended proximally and  distally down to the fascia. Fascia was split in line with the skin  incision. The greater trochanteric bursa was excised. The anterior two  thirds of the abductors were subperiosteally dissected from the greater  trochanter incorporating a portion of the vastus lateralis. This flap was  reflected anteriorly and superiorly, taking the gluteus minimus tendon with  this anterior flap. A capsulotomy and capsulectomy was performed on the hip,  and the hip was dislocated anteriorly. A provisional femoral neck cut was  made. The piriformis fossa was cleaned out, and a femoral neck cutting guide  was used to make the final femoral neck cut. We took a starter reamer and  passed it in the femoral canal, and then at the posterior lateral corner of  the neck/head junction, we started our box osteotome and then proceeded with  sequential broaches up  to a size 14.   We then prepared the acetabulum by removing all peripheral soft tissue,  placing anterior and posterior and superior retractors. We had got exposure.  We removed peripheral osteophytes and reamed sequentially up from a 44 to a  53, placed a 54 cup, placed two screws in the safe zone, and then trialed  with a zero liner. We got an excellent range of motion and good stability,  good tension on the abductors. We then removed the trial liner and broach  and placed the 0 degree polyethylene liner, Corail stem and the 32-mm head  with a +1 length. We irrigated the joint, reduced the hip, reproduced our  reduction in terms of motion and stability, and closed the abductors with #5  heavy suture passed through the greater trochanter through drill holes. We  then repaired the fascia with #1 Bralon interrupted fashion. We repaired the  subcu with 0 Monocryl, 2-0 Monocryl, again placing a subcu pain  pump  catheter and injected 30 cc of Marcaine with epinephrine deep to the  abductor mechanism. We reapproximated the skin edges with staples, placed a  sterile dressing, activated the pain pump, placed her in an abduction  pillow, took her to the recovery room in stable condition. She will be  weightbearing as tolerated, anterior approach precautions, Ancef 24 hours,  Lovenox DVT prevention, along with TED hose and sequential compression  devices.      Carole Civil, M.D.  Electronically Signed     SEH/MEDQ  D:  06/19/2005  T:  06/19/2005  Job:  [id number]

## 2011-01-12 NOTE — Discharge Summary (Signed)
   NAME:  [name], [name]                          ACCOUNT NO.:  [id number]   MEDICAL RECORD NO.:  [id number]                   PATIENT TYPE:  INP   LOCATION:  A326                                 FACILITY:  [facility]   PHYSICIAN:  Edward L. Luan Pulling, M.D.             DATE OF BIRTH:  1929/05/31   DATE OF ADMISSION:  05/10/2002  DATE OF DISCHARGE:  05/18/2002                                 DISCHARGE SUMMARY   DISCHARGE DIAGNOSES:  1. Pneumonia.  2. Chronic obstructive pulmonary disease.  3. Non-insulin-dependent diabetes.  4. Gastroesophageal reflux disease.  5. Obesity.   HISTORY OF PRESENT ILLNESS:  The patient came to the emergency room with  cough and congestion.  She had fever, elevated white blood cell count with  white count of 13,000 and had what appeared to be a left lower lobe  infiltrate.  Her physical exam showed that she had some rhonchi diffusely.  Her heart was regular without gallop.  She appeared to be tired.  Extremities showed no edema, CNS grossly intact.  Her laboratory work was as  mentioned.  BUN 21, creatinine 1.  Urine was normal.  Electrolytes were  normal.   HOSPITAL COURSE:  She was begun on intravenous fluids, given IV antibiotics,  and improved.  By the time of discharge she was much improved and her chest  was totally clear.  Her blood sugars have been under good control.   DISPOSITION:  She is discharged home in improved condition.   DISCHARGE MEDICATIONS:  1. Ceftin 250 mg b.i.d.  2. Protonix 40 mg q.d.  3. Glucovance 5/500 two in the morning, one in the evening.   FOLLOW-UP:  She will follow up in my office in about a month.                                                Edward L. Luan Pulling, M.D.    ELH/MEDQ  D:  05/18/2002  T:  05/19/2002  Job:  [id number]

## 2011-01-12 NOTE — Discharge Summary (Signed)
[name], [name]                ACCOUNT NO.:  [id number]   MEDICAL RECORD NO.:  [id number]          PATIENT TYPE:  INP   LOCATION:  A206                          FACILITY:  [facility]   PHYSICIAN:  Edward L. Luan Pulling, M.D.DATE OF BIRTH:  1929/03/02   DATE OF ADMISSION:  10/19/2004  DATE OF DISCHARGE:  02/26/2006LH                                 DISCHARGE SUMMARY   [name] is a 77 year old with about a two-week history of COPD according  to her family. According to her, she has only been sick a day or two. She  has had atypical chest pain. She has had difficulty lying flat. She has a  history of COPD, chronic headache, noninsulin-dependent diabetes,  gastroesophageal reflux disease, obesity. She has had right shoulder  surgery, and she has had multiple bouts of atypical chest pain.   PHYSICAL EXAMINATION:  GENERAL:  Physical exam on admission showed that she  appeared to be acutely ill, short of breath. Blood pressure 165/95, pulse  was in the 90s.  CHEST:  Her chest showed rhonchi bilaterally. Decreased breath sounds in  general.  HEART:  Heart was regular without gallop.   LABORATORY DATA:  Hemoglobin was 12, hematocrit 34. Point of care cardiac  markers negative x2. Portable chest x-ray showed cardiomegaly, no acute  abnormality. White count 7,100. Electrolytes were normal with the exception  of a glucose of 122.    Assessment at the time of admission, historically, it sounded like it could  be congestive heart failure, but by physical examination and laboratory  evaluation, it appeared to be more of a COPD exacerbation. She was treated  with intravenous steroids, inhaled bronchodilators, and antibiotics and  improved. By February 26, she was ready for discharge.   She was discharged home on her regular medications which are glipizide,  Aciphex, Naproxen, and she is going to be on Ceftin 500 mg b.i.d.;  prednisone 40 mg for 3 days, 30 for 3 days, 20 for 3 days, 10 for 3 days  and  then stop; ramipril 5 mg daily; Zocor 20 mg daily. I have explained to her  that her blood sugar is likely to be uncontrolled while she is on the  steroids. She understands that and will monitor.      ELH/MEDQ  D:  10/22/2004  T:  10/23/2004  Job:  [id number]

## 2011-01-12 NOTE — Procedures (Signed)
NAMETASFIA, HANSEN                ACCOUNT NO.:  [id number]   MEDICAL RECORD NO.:  [id number]          PATIENT TYPE:  OUT   LOCATION:  RESP                          FACILITY:  [facility]   PHYSICIAN:  Edward L. Luan Pulling, M.D.DATE OF BIRTH:  [birth year]   DATE OF PROCEDURE:  12/01/2004  DATE OF DISCHARGE:                              PULMONARY FUNCTION TEST   1.  Spirometry is normal.  2.  Lung volumes show a slight decrease in total lung capacity suggesting a      slight restrictive change.  3.  DLCO is severely reduced.  4.  Arterial blood gasses show mild resting hypoxemia.      ELH/MEDQ  D:  12/01/2004  T:  12/01/2004  Job:  [id number]

## 2011-01-12 NOTE — Group Therapy Note (Signed)
[name], [name]                ACCOUNT NO.:  [id number]   MEDICAL RECORD NO.:  [id number]          PATIENT TYPE:  INP   LOCATION:  [id number]                          FACILITY:  [facility]   PHYSICIAN:  Carole Civil, M.D.DATE OF BIRTH:  November 20, 1928   DATE OF PROCEDURE:  DATE OF DISCHARGE:                                   PROGRESS NOTE   Postop day two.  Status post a right total hip replacement.  Hemoglobin  today is 8.6, started at 12.6.  recommending transfusion.  Vital signs are  stable.  Pain level is 1-3.  CBG is 166.  Urine output picked up very well  with Lasix.   We will follow the new protocol, remove the Foley, Hep-Lock the IV, increase  ambulation and plan for discharge to rehab on Friday.      Carole Civil, M.D.  Electronically Signed     SEH/MEDQ  D:  06/21/2005  T:  06/21/2005  Job:  [id number]

## 2011-01-12 NOTE — Procedures (Signed)
[name], [name]                ACCOUNT NO.:  [id number]   MEDICAL RECORD NO.:  [id number]          PATIENT TYPE:  OUT   LOCATION:  RAD                           FACILITY:  [facility]   PHYSICIAN:  Edward L. Luan Pulling, M.D.DATE OF BIRTH:  02-May-1929   DATE OF PROCEDURE:  DATE OF DISCHARGE:  05/22/2005                              PULMONARY FUNCTION TEST   1.  Spirometry shows no definite ventilatory defect. There is no evidence of      airflow obstruction.  2.  Lung volumes show a mild reduction in total lung capacity suggesting a      restrictive component.  3.  DLCO is moderately reduced.  4.  Blood gas shows mild resting hypoxemia compared to normal.      Edward L. Luan Pulling, M.D.  Electronically Signed     ELH/MEDQ  D:  05/25/2005  T:  05/25/2005  Job:  [id number]

## 2011-01-12 NOTE — Group Therapy Note (Signed)
   NAME:  ROSELINA, LANINGHAM                      ACCOUNT NO.:  [id number]   MEDICAL RECORD NO.:  [id number]                  PATIENT TYPE:   LOCATION:                                       FACILITY:   PHYSICIAN:  Angus G. Everette Rank, M.D.              DATE OF BIRTH:   DATE OF PROCEDURE:  05/16/2002  DATE OF DISCHARGE:                                   PROGRESS NOTE   PROBLEMS:  This patient was admitted with bronchopneumonia, dehydration, non-  insulin-dependent diabetes.   SUBJECTIVE:  She does appear to be better.   OBJECTIVE:  VITAL SIGNS:  Blood pressure 137/34, respirations 20, pulse 65,  temperature 98.2.  LUNGS:  Coarse breath sounds, rhonchi over the lower lung field.  HEART:  Regular rhythm.  ABDOMEN:  No palpable organs or masses.   ASSESSMENT:  The patient was admitted with bronchopneumonia and dehydration,  non-insulin-dependent diabetes.  She has improved on current regimen.   PLAN:  Continue current regimen.  The patient will possibly be discharged in  the next day or two.                                                Angus G. Everette Rank, M.D.    AGM/MEDQ  D:  05/16/2002  T:  05/18/2002  Job:  [id number]

## 2011-01-12 NOTE — H&P (Signed)
[name], [name]                ACCOUNT NO.:  [id number]   MEDICAL RECORD NO.:  [id number]          PATIENT TYPE:  INP   LOCATION:  A206                          FACILITY:  [facility]   PHYSICIAN:  Edward L. Luan Pulling, M.D.DATE OF BIRTH:  [birth year]   DATE OF ADMISSION:  10/19/2004  DATE OF DISCHARGE:  LH                                HISTORY & PHYSICAL   REASON FOR ADMISSION:  Shortness of breath.   HISTORY:  Ms. Vazquez is a 77 year old with a long known history of diabetes  and who has been sick according to her family for about two weeks with what  appears to be an exacerbation of COPD.  According to her, she has only been  sick for a day or two.  She is diabetic and has a history of atypical chest  pain.  She has been coughing, she has had some discomfort in her chest which  is substernal.  She says she has not been able to lay flat.   PAST MEDICAL HISTORY:  1.  COPD.  2.  Chronic headache.  3.  Non-insulin dependent diabetes.  4.  Gastroesophageal reflux disease.  5.  Obesity.  6.  Right shoulder surgery.   MEDICATIONS:  1.  Glipizide, she does not know the dose.  2.  Aciphex, she does not know the dose.  3.  Naproxen, dose is unknown.  4.  She has been taking Levaquin recently for what is apparently an acute      bronchitis.   SOCIAL HISTORY:  She is divorced, lives alone.  She is a nonsmoker, she does  not drink any alcohol.   FAMILY HISTORY:  Her mother died in an automobile accident in her 50s.  Her  father died of bone cancer in his 53s.   PHYSICAL EXAMINATION:  GENERAL:  She appears to be acutely ill.  She appears  short of breath.  VITAL SIGNS:  Blood pressure 165/95, pulse is in the 90s.  HEENT:  Pupils equal, round, reactive to light and accommodation.  Nose and  throat are clear.  NECK:  Supple without masses.  CHEST:  Some rhonchi bilaterally.  HEART:  Regular without gallop.  ABDOMEN:  Soft, obese, without masses.  EXTREMITIES:  Trace edema.  NEUROLOGIC:   Grossly intact.   LABORATORY DATA:  Electrolytes are normal with the exception of a glucose of  122.  CBC shows white count 7100, hemoglobin is 12, hematocrit 34.  Point-of-  care cardiac markers are negative x2.  Portable chest x-ray shows  cardiomegaly, no acute abnormality.  Other labs are pending.   ASSESSMENT:  By history, it sounds like she may have congestive heart  failure, but that is not the way her physical examination appears.   PLAN:  My plan will be to go ahead and treat her with intravenous  antibiotics, inhaled bronchodilators, etc.  Go ahead and get a BNP, get an  echocardiogram, and follow.      ELH/MEDQ  D:  10/19/2004  T:  10/19/2004  Job:  [id number]

## 2011-01-12 NOTE — H&P (Signed)
[name], [name]                ACCOUNT NO.:  [id number]   MEDICAL RECORD NO.:  [id number]          PATIENT TYPE:  AMB   LOCATION:                                FACILITY:  [facility]   PHYSICIAN:  Carole Civil, M.D.DATE OF BIRTH:  08-31-1928   DATE OF ADMISSION:  06/19/2005  DATE OF DISCHARGE:  LH                                HISTORY & PHYSICAL   CHIEF COMPLAINT:  Right hip pain.   HISTORY OF PRESENT ILLNESS:  This is a 77 year old female with history of  hypertension, COPD, diabetes, hiatal hernia, gastroesophageal reflux  disease, lipid elevation, status post tonsillectomy, cholecystectomy,  vaginal hysterectomy, appendectomy and degenerative disc disease of lumbar  spine presented with right hip pain back in August 2006.  She had to be  admitted.  Workup showed that she did have degenerative disc disease in the  lumbar spine and osteoarthritis of the right hip.  Although her spine showed  significant degenerative disc disease, there was no nerve root compression  and did have some evidence of spinal stenosis.  We treated that and she  still had right groin pain, so she was scheduled for total hip replacement.  We have reviewed the risks and benefits of the procedure and she has agreed  to proceed.  She understands the complications of infection may cause need  for revision and long-term antibiotics.  Her pain is described as severe in  the groin, nonradiating associated with some thigh pain and some  restrictions in motion and difficulty ambulating which requires a walker.   ALLERGIES:  No known drug allergies including no known allergy to  penicillin.   MEDICATIONS:  Altace, Glyburide, metformin, Aciphex, Xanax, fluconazole,  Xalatan, albuterol and Atrovent.   FAMILY HISTORY:  Diabetes, hypertension and COPD.   SOCIAL HISTORY:  She does not smoke or drink.  She does have supportive  family.   REVIEW OF SYSTEMS:  Negative for 10 systems despite her history of  definite  fatigue, shortness of breath, history of chest pain, cough, pneumonia, COPD,  GERD, diarrhea, constipation, burning, headache, numbness, dizziness,  migraines, unsteady gait, diabetes, poor vision, poor hearing, sinusitis,  toothache and seasonal allergies.  This history was taken via patient  questionnaire.   PHYSICAL EXAMINATION:  VITAL SIGNS:  Blood pressure runs roughly 128/80,  height 64 inches, respirations 16, pulse 66.  GENERAL:  Normal development, grooming, hygiene.  Fair nutrition.  Mild  obesity.  CARDIAC:  Minimal edema, normal pulses, good profusion, normal temperature.  SKIN:  Integrity was intact to all four extremities.  LYMPHS:  No obvious lymph nodes in cervical spine or groin.  NEUROLOGIC:  She was awake, alert and oriented x3.  Mood and affect were  normal.  No focal findings on exam.  EXTREMITIES:  Right hip has crepitance, pain on internal rotation.  There is  no shortening, no muscle atrophy.  No increased muscle tone.  Good strength  in all four extremity alignment, otherwise normal.  Upper extremities showed  no range of motion deficit, no contractures, subluxation, atrophy or tremor.   LABORATORY DATA  AND X-RAY FINDINGS:  Osteoarthritis of the right hip.  These  were repeated on October 12.   IMPRESSION:  Osteoarthritis, right hip.   PLAN:  Right total hip replacement.      Carole Civil, M.D.  Electronically Signed     SEH/MEDQ  D:  06/07/2005  T:  06/07/2005  Job:  [id number]   cc:   Percell Miller L. Luan Pulling, M.D.  Fax: [phone]

## 2011-01-12 NOTE — H&P (Signed)
NAMEJENNELLE, [name] NO.:  [id number]   MEDICAL RECORD NO.:  [id number]          PATIENT TYPE:  INP   LOCATION:  [id number]                          FACILITY:  [facility]   PHYSICIAN:  Unk Lightning, MDDATE OF BIRTH:  12-Nov-1928   DATE OF ADMISSION:  02/13/2005  DATE OF DISCHARGE:  LH                                HISTORY & PHYSICAL   The patient is 77 year old moderately obese white female patient of Dr.  Luan Pulling who was recently given nebulizer for home several weeks ago.  The  patient complains of increasing dyspnea and some episodes of paroxysmal  nocturnal dyspnea over the preceding week or two.  She specifically denies  any associated anginal pain during these episodes, and she does relate a  nonproductive cough over the preceding one week.  The patient becomes  anxious with episodes of dyspnea and also complained of a localized knot  sensation at the sternal area but not necessarily associated with all of  these episodes simultaneously.  She had a heart catheterization in 2003  revealing normal coronary arteries.  Essentially she is admitted for mild  exacerbation of presumed asthmatic bronchitis and increasing dyspnea.  There  is no clinical evidence of DVT or PE at present.   PAST MEDICAL HISTORY:  1.  Hypertension.  2.  Diabetes.  3.  Hiatal hernia.  4.  GERD.  5.  Hyperlipidemia.  6.  Degenerative joint disease.   PAST SURGICAL HISTORY:  1.  Tonsillectomy.  2.  Cholecystectomy.  3.  Vaginal hysterectomy.  4.  Appendectomy.   ALLERGIES:  No known allergies.   SOCIAL HISTORY:  She  lives alone next to her daughter.  Never smoked.   CURRENT MEDICATIONS:  1.  Altace once a day.  2.  Naprosyn once a day.  3.  Glyburide b.i.d.  4.  Aciphex 20 mg daily.   PHYSICAL EXAMINATION:  VITAL SIGNS:  Blood pressure 158/82.  The patient is  afebrile. Respiratory rate is 22.  HEENT:  Head normocephalic and atraumatic.  Eyes PERRLA.  EOMs intact.  Sclerae clear.  Conjunctivae pink.  Throat shows no erythema or exudate.  NECK:  No jugular venous distention, 45 degree angulation.  No carotid  bruits, no thyromegaly or lymphadenopathy.  LUNGS: Show diminished breath sounds at the bases, no rales.  Mildly  prolonged expiratory phase.  No wheeze, no rhonchi appreciable.  HEART:  Regular rhythm.  No S3, S4 gallops.  No murmurs.  No heaves,  thrills, or rubs.  ABDOMEN: Obese, soft, nontender. Bowel sounds normoactive.  No rebound.  No  masses or organomegaly.  EXTREMITIES:  No clubbing, cyanosis, or edema.  Peripheral pulses 1 to 2+  and intact bilaterally.  NEUROLOGIC:  Cranial nerves II-XII grossly intact.  The patient moves all  four extremities.   IMPRESSION:  1.  Exacerbation of asthmatic bronchitis with increasing dyspnea, etiology      undetermined, over several weeks.  2.  Diabetes mellitus.  Fasting blood sugar 147 this a.m.  3.  Hypertension.  4.  Gastroesophageal reflux disease and hiatal hernia.  PLAN:  1.  Admit.  2.  Give IV Solu-Medrol 125 q.8h. x 3, then decrease to 80 q.8h. x 3.  3.  DuoNeb nebulizer q.4h. while awake.  4.  Zithromax 500 mg IV daily.  5.  Fasting blood sugar 4 p.m. for anticipated hyperglycemia.  6.  I will make further recommendations as the database expands.       RMD/MEDQ  D:  02/13/2005  T:  02/13/2005  Job:  [id number]

## 2011-01-12 NOTE — Group Therapy Note (Signed)
NAMEANNAJO, PETREE                ACCOUNT NO.:  [id number]   MEDICAL RECORD NO.:  [id number]          PATIENT TYPE:  INP   LOCATION:  [id number]                          FACILITY:  [facility]   PHYSICIAN:  Edward L. Luan Pulling, M.D.DATE OF BIRTH:  [birth year]   DATE OF PROCEDURE:  02/13/2005  DATE OF DISCHARGE:                                   PROGRESS NOTE   Mrs. Yarrow was admitted yesterday with COPD.  She has had a nonproductive  cough.  She has a sensation of a knot in her chest.  She had multiple  evaluations including a heart cath that did not show any evidence of  coronary disease.   Physical exam now shows that she is awake and alert, still complaining of  shortness of breath.  Her temp is 97, pulse 85, respirations 20.  Blood  sugar 356.  Blood pressure 150/83.   Chest x-ray is with hyperinflation suggestive of COPD.  This is a very  similar situation to what she had about two months ago.   ASSESSMENT:  She has chronic obstructive pulmonary disease with  exacerbation.  She did not have a D-dimer so I am going to go ahead and  check that today, continue with her other treatments, continue meds.  She  says she thinks that she might need to have a GI evaluation, and she may  need that, but I do not think she needed it right now because she is still  pretty short of breath.   We will plan to put her on sliding scale for her sugar, change her diet,  continue all the other treatments and follow.       ELH/MEDQ  D:  02/14/2005  T:  02/14/2005  Job:  [id number]

## 2011-01-12 NOTE — Group Therapy Note (Signed)
   NAME:  [name], [name]                          ACCOUNT NO.:  [id number]   MEDICAL RECORD NO.:  [id number]                   PATIENT TYPE:  INP   LOCATION:  A326                                 FACILITY:  [facility]   PHYSICIAN:  Alonza Bogus, M.D.              DATE OF BIRTH:  1929/02/11   DATE OF PROCEDURE:  DATE OF DISCHARGE:                                   PROGRESS NOTE   PROBLEMS:  Pneumonia, diabetes, dehydration.   SUBJECTIVE:  The patient says she is better but she is still coughing  significantly and looks very short of breath.   PHYSICAL EXAMINATION:  VITAL SIGNS:  Tmax 99.1, blood pressure 110/54, pulse  in the 50s.  CHEST:  Rhonchi bilaterally without wheezes.  HEART:  Regular without murmur, rub, or gallop.  EXTREMITIES:  No edema.   LABORATORIES:  Accu-Chek 174.   ASSESSMENT:  She has pneumonia, diabetes, dehydration.  She does seem to be  improving but she has only been in the hospital for less than a day so I do  not think she is ready to go home yet.  She has received her intravenous  antibiotics.  I would like to see her doing a bit better before she is  discharged.                                               Alonza Bogus, M.D.    ELH/MEDQ  D:  05/11/2002  T:  05/11/2002  Job:  [id number]

## 2011-06-04 DIAGNOSIS — H269 Unspecified cataract: Secondary | ICD-10-CM | POA: Insufficient documentation

## 2011-07-09 DIAGNOSIS — I509 Heart failure, unspecified: Secondary | ICD-10-CM | POA: Insufficient documentation

## 2011-07-09 DIAGNOSIS — E875 Hyperkalemia: Secondary | ICD-10-CM | POA: Insufficient documentation

## 2011-07-09 DIAGNOSIS — E11649 Type 2 diabetes mellitus with hypoglycemia without coma: Secondary | ICD-10-CM | POA: Insufficient documentation

## 2011-07-09 DIAGNOSIS — E1165 Type 2 diabetes mellitus with hyperglycemia: Secondary | ICD-10-CM | POA: Insufficient documentation

## 2011-07-17 ENCOUNTER — Other Ambulatory Visit: Payer: Self-pay

## 2011-07-17 ENCOUNTER — Emergency Department (HOSPITAL_COMMUNITY): Payer: Medicare Other

## 2011-07-17 ENCOUNTER — Inpatient Hospital Stay (HOSPITAL_COMMUNITY)
Admission: EM | Admit: 2011-07-17 | Discharge: 2011-07-21 | DRG: 918 | Disposition: A | Discharge status: Home / Self Care | Payer: Medicare Other | Attending: Pulmonary Disease | Admitting: Pulmonary Disease

## 2011-07-17 DIAGNOSIS — M1990 Unspecified osteoarthritis, unspecified site: Secondary | ICD-10-CM | POA: Diagnosis present

## 2011-07-17 DIAGNOSIS — J4489 Other specified chronic obstructive pulmonary disease: Secondary | ICD-10-CM | POA: Diagnosis present

## 2011-07-17 DIAGNOSIS — E119 Type 2 diabetes mellitus without complications: Secondary | ICD-10-CM | POA: Diagnosis present

## 2011-07-17 DIAGNOSIS — T424X1A Poisoning by benzodiazepines, accidental (unintentional), initial encounter: Secondary | ICD-10-CM | POA: Diagnosis present

## 2011-07-17 DIAGNOSIS — N390 Urinary tract infection, site not specified: Secondary | ICD-10-CM | POA: Diagnosis present

## 2011-07-17 DIAGNOSIS — I10 Essential (primary) hypertension: Secondary | ICD-10-CM | POA: Diagnosis present

## 2011-07-17 DIAGNOSIS — R4182 Altered mental status, unspecified: Secondary | ICD-10-CM | POA: Diagnosis present

## 2011-07-17 DIAGNOSIS — J449 Chronic obstructive pulmonary disease, unspecified: Secondary | ICD-10-CM | POA: Diagnosis present

## 2011-07-17 DIAGNOSIS — T424X4A Poisoning by benzodiazepines, undetermined, initial encounter: Principal | ICD-10-CM | POA: Diagnosis present

## 2011-07-17 DIAGNOSIS — N289 Disorder of kidney and ureter, unspecified: Secondary | ICD-10-CM

## 2011-07-17 HISTORY — DX: Chronic obstructive pulmonary disease, unspecified: J44.9

## 2011-07-17 HISTORY — DX: Major depressive disorder, single episode, unspecified: F32.9

## 2011-07-17 HISTORY — DX: Depression, unspecified: F32.A

## 2011-07-17 LAB — MRSA PCR SCREENING: MRSA by PCR: NEGATIVE

## 2011-07-17 LAB — DIFFERENTIAL
Basophils Absolute: 0.1 10*3/uL (ref 0.0–0.1)
Basophils Relative: 1 % (ref 0–1)
Eosinophils Absolute: 0.1 10*3/uL (ref 0.0–0.7)
Eosinophils Relative: 1 % (ref 0–5)
Monocytes Absolute: 0.4 10*3/uL (ref 0.1–1.0)
Monocytes Relative: 6 % (ref 3–12)
Neutro Abs: 4.8 10*3/uL (ref 1.7–7.7)

## 2011-07-17 LAB — BLOOD GAS, ARTERIAL
Drawn by: 21310
TCO2: 20.8 mmol/L (ref 0–100)
pCO2 arterial: 49.3 mmHg — ABNORMAL HIGH (ref 35.0–45.0)
pH, Arterial: 7.276 — ABNORMAL LOW (ref 7.350–7.400)

## 2011-07-17 LAB — URINALYSIS, ROUTINE W REFLEX MICROSCOPIC
Hgb urine dipstick: NEGATIVE
Nitrite: POSITIVE — AB
pH: 5.5 (ref 5.0–8.0)

## 2011-07-17 LAB — COMPREHENSIVE METABOLIC PANEL
Albumin: 3.3 g/dL — ABNORMAL LOW (ref 3.5–5.2)
BUN: 17 mg/dL (ref 6–23)
Creatinine, Ser: 1.5 mg/dL — ABNORMAL HIGH (ref 0.50–1.10)
GFR calc Af Amer: 37 mL/min — ABNORMAL LOW (ref >90)
Glucose, Bld: 161 mg/dL — ABNORMAL HIGH (ref 70–99)
Total Protein: 6.3 g/dL (ref 6.0–8.3)

## 2011-07-17 LAB — CBC
HCT: 35.1 % — ABNORMAL LOW (ref 36.0–46.0)
Hemoglobin: 11.3 g/dL — ABNORMAL LOW (ref 12.0–15.0)
MCH: 29.9 pg (ref 26.0–34.0)
MCHC: 32.2 g/dL (ref 30.0–36.0)
MCV: 92.9 fL (ref 78.0–100.0)
RDW: 14.3 % (ref 11.5–15.5)

## 2011-07-17 LAB — RAPID URINE DRUG SCREEN, HOSP PERFORMED
Amphetamines: NOT DETECTED
Benzodiazepines: POSITIVE — AB
Cocaine: NOT DETECTED
Opiates: NOT DETECTED

## 2011-07-17 LAB — URINE MICROSCOPIC-ADD ON

## 2011-07-17 LAB — GLUCOSE, CAPILLARY: Glucose-Capillary: 143 mg/dL — ABNORMAL HIGH (ref 70–99)

## 2011-07-17 LAB — AMMONIA: Ammonia: 15 umol/L (ref 11–60)

## 2011-07-17 LAB — APTT: aPTT: 29 seconds (ref 24–37)

## 2011-07-17 MED ORDER — DEXTROSE 5 % IV SOLN
1.0000 g | INTRAVENOUS | Status: DC
  Administered 2011-07-17 – 2011-07-20 (×4): 1 g via INTRAVENOUS
  Filled 2011-07-17 (×6): qty 10

## 2011-07-17 MED ORDER — INSULIN ASPART 100 UNIT/ML ~~LOC~~ SOLN
0.0000 [IU] | Freq: Three times a day (TID) | SUBCUTANEOUS | Status: DC
  Administered 2011-07-18 – 2011-07-19 (×2): 3 [IU] via SUBCUTANEOUS
  Administered 2011-07-19 – 2011-07-20 (×3): 5 [IU] via SUBCUTANEOUS
  Administered 2011-07-20: 3 [IU] via SUBCUTANEOUS
  Administered 2011-07-20 – 2011-07-21 (×2): 2 [IU] via SUBCUTANEOUS

## 2011-07-17 MED ORDER — ONDANSETRON HCL 4 MG PO TABS: 4.0000 mg | ORAL_TABLET | Freq: Four times a day (QID) | ORAL | Status: DC | PRN

## 2011-07-17 MED ORDER — IPRATROPIUM BROMIDE 0.02 % IN SOLN
0.5000 mg | Freq: Four times a day (QID) | RESPIRATORY_TRACT | Status: DC
  Administered 2011-07-17 – 2011-07-21 (×12): 0.5 mg via RESPIRATORY_TRACT
  Filled 2011-07-17 (×12): qty 2.5

## 2011-07-17 MED ORDER — ONDANSETRON HCL 4 MG/2ML IJ SOLN: 4.0000 mg | Freq: Four times a day (QID) | INTRAMUSCULAR | Status: DC | PRN

## 2011-07-17 MED ORDER — ALBUTEROL SULFATE (5 MG/ML) 0.5% IN NEBU
2.5000 mg | INHALATION_SOLUTION | Freq: Four times a day (QID) | RESPIRATORY_TRACT | Status: DC
  Administered 2011-07-17 – 2011-07-21 (×12): 2.5 mg via RESPIRATORY_TRACT
  Filled 2011-07-17 (×12): qty 0.5

## 2011-07-17 MED ORDER — NALOXONE HCL 1 MG/ML IJ SOLN
2.0000 mg | Freq: Once | INTRAMUSCULAR | Status: AC
  Administered 2011-07-17: 2 mg via INTRAVENOUS
  Filled 2011-07-17: qty 2

## 2011-07-17 MED ORDER — ENOXAPARIN SODIUM 30 MG/0.3ML ~~LOC~~ SOLN
30.0000 mg | SUBCUTANEOUS | Status: DC
  Administered 2011-07-17: 30 mg via SUBCUTANEOUS
  Filled 2011-07-17: qty 0.3

## 2011-07-17 MED ORDER — ACETAMINOPHEN 650 MG RE SUPP: 650.0000 mg | Freq: Four times a day (QID) | RECTAL | Status: DC | PRN

## 2011-07-17 MED ORDER — SODIUM CHLORIDE 0.9 % IV SOLN
INTRAVENOUS | Status: DC
  Administered 2011-07-18: 05:00:00 via INTRAVENOUS
  Administered 2011-07-20: 1000 mL via INTRAVENOUS

## 2011-07-17 MED ORDER — CEFTRIAXONE SODIUM 1 G IJ SOLR: 1.0000 g | Freq: Once | INTRAMUSCULAR | Status: DC

## 2011-07-17 MED ORDER — ACETAMINOPHEN 325 MG PO TABS: 650.0000 mg | ORAL_TABLET | Freq: Four times a day (QID) | ORAL | Status: DC | PRN

## 2011-07-17 MED ORDER — PNEUMOCOCCAL VAC POLYVALENT 25 MCG/0.5ML IJ INJ
0.5000 mL | INJECTION | INTRAMUSCULAR | Status: DC
  Filled 2011-07-17: qty 0.5

## 2011-07-17 NOTE — ED Notes (Signed)
Pt moving head some when touched.

## 2011-07-17 NOTE — ED Notes (Signed)
Pt cont to keep eyes closed. No verbal response. Numerous family members at bedside.

## 2011-07-17 NOTE — ED Notes (Signed)
Family at bedside. 

## 2011-07-17 NOTE — ED Notes (Signed)
Family here with pt now. Pt moaning. edp in room to eval pt

## 2011-07-17 NOTE — H&P (Signed)
[name] MRN: [id number] DOB/AGE: [birth year] 77 y.o. Primary Care Physician:Naria Abbey L, MD Admit date: 07/17/2011 Chief Complaint: Altered mental status HPI: This is an 77 year old who according to her family has been somewhat depressed over the last several months. She also may be developing some symptoms of dementia. She was found unresponsive by family today. She was sitting in the bed and had a bottle of Xanax in her hand which had spilled in the floor so it's not clear how much if any of the Xanax she took. She has not had any history of suicidal ideation or suicide gestures as far as is known. Her family says she just does not seem to be herself. She was brought to the emergency room for evaluation and evaluation showed that she did have altered mental status but she was protecting her airway with a good gag reflex and did not have anything specific on her laboratory work that would explain why she has had this happen. CT did not show an acute stroke.  Past Medical History  Diagnosis Date  . COPD (chronic obstructive pulmonary disease)   . Diabetes mellitus   . Depressed    History reviewed. No pertinent past surgical history. Knee replacement     No family history on file. Diabetes Social History:  does not have a smoking history on file. She does not have any smokeless tobacco history on file. Her alcohol and drug histories not on file. She lives at home with family  Allergies:  Allergies  Allergen Reactions  . Hydromorphone Hcl Other (See Comments)    Patient goes out of right state of mind.     Medications Prior to Admission  Medication Dose Route Frequency Provider Last Rate Last Dose  . cefTRIAXone (ROCEPHIN) 1 g in dextrose 5 % 50 mL IVPB  1 g Intravenous [id number] Delora Fuel, MD   1 g at [id number] [id number]  . naloxone [facility]) injection 2 mg  2 mg Intravenous Once Delora Fuel, MD   2 mg at [id number] [id number]  . DISCONTD: cefTRIAXone (ROCEPHIN) injection 1 g  1 g  Intramuscular Once Delora Fuel, MD       No current outpatient prescriptions on file as of 07/17/2011.       [id number] from the symptoms mentioned above,there are no other symptoms referable to all systems reviewed.  Physical Exam: Blood pressure 108/59, pulse 40, resp. rate 24, SpO2 100.00%. She will arouse somewhat. She does have a good gag reflex. She has a nonrebreather mask on. Her nose and throat are clear. Her mucous membranes are slightly dry. Her neck is supple without masses. Her chest shows decreased breath sounds. Her heart is regular without gallop. Her abdomen is soft without masses bowel sounds present and active. Her extremities showed no edema. Her central nervous system examination is nonfocal    Basename 07/17/11 1345  WBC 6.9  NEUTROABS 4.8  HGB 11.3*  HCT 35.1*  MCV 92.9  PLT 273    Basename 07/17/11 1345  NA 142  K 3.7  CL 107  CO2 27  GLUCOSE 161*  BUN 17  CREATININE 1.50*  CALCIUM 8.8  MG --  lablast2(ast:2,ALT:2,alkphos:2,bilitot:2,prot:2,albumin:2)@    No results found for this or any previous visit (from the past 240 hour(s)).   Ct Head Wo Contrast (if Head Trauma)  07/17/2011  *RADIOLOGY REPORT*  Clinical Data: Found unresponsive.  CT HEAD WITHOUT CONTRAST  Technique:  Contiguous axial images were obtained from the base of the skull through the  vertex without contrast.  Comparison: .  04/19/2011.  Findings: No intracranial hemorrhage.  No CT evidence of large acute infarct.  Small acute infarct cannot be excluded by CT.  No intracranial mass lesion detected on this unenhanced exam.  Prominent choroid plexus cysts which are calcified.  Small vessel disease type changes.  No hydrocephalus.  Partially empty sella.  Mucosal thickening maxillary sinuses and left sphenoid sinus air cells.  IMPRESSION: No intracranial hemorrhage or CT evidence of large acute infarct.  Original Report Authenticated By: Doug Sou, M.D.   Dg Chest Portable 1  View  07/17/2011  *RADIOLOGY REPORT*  Clinical Data: Patient unresponsive.  PORTABLE CHEST - 1 VIEW  Comparison: 03/08/2010  Findings: Lung volumes are very low.  No overt edema or infiltrate. Stable cardiomegaly.  No pleural effusions visualized.  Stable aortic tortuosity.  IMPRESSION: Very low lung volumes.  Original Report Authenticated By: Azzie Roup, M.D.   Impression: She has altered mental status perhaps from overdosing on Xanax. She has history of diabetes. She has a history of COPD. Active Problems:  * No active hospital problems. *      Plan: She'll be admitted to the intensive care unit. She may require some BiPAP but her blood gas is pending.      [name] 07/17/2011, 6:19 PM

## 2011-07-17 NOTE — ED Provider Notes (Signed)
History     CSN: [id number] Arrival date & time: 07/17/2011  1:19 PM   First MD Initiated Contact with Patient 07/17/11 1327      Chief Complaint  Patient presents with  . Altered Mental Status    (Consider location/radiation/quality/duration/timing/severity/associated sxs/prior treatment) Patient is a 77 y.o. female presenting with altered mental status. The history is provided by a relative and the EMS personnel. The history is limited by the condition of the patient (unresponsive).  Altered Mental Status  Family states that they spoke with her yesterday and she seemed to be her normal self. He tried to call her this morning and she did not answer her phone. They went to the home and found her lying on the floor unresponsive and has she was even not sure if the patient was breathing. Some Xanax tablets were scattered on the floor, but the family is not sure if she had taken an EF. The natural and the prescription was filled and for how many. 9 tablets are brought in with the patient. CBG was reported to be normal by EMS. She has apparently been complaining of a headache recently, but hadn't expressed no problems with depression or suicidal ideation. Family states that she has repeatedly stated that she does not wish to be on any kind of life support machines, and they did not wish her to be intubated if her condition deteriorates to that point. Past Medical History  Diagnosis Date  . COPD (chronic obstructive pulmonary disease)   . Diabetes mellitus   . Depressed     History reviewed. No pertinent past surgical history.  No family history on file.  History  Substance Use Topics  . Smoking status: Not on file  . Smokeless tobacco: Not on file  . Alcohol Use:     OB History    Grav Para Term Preterm Abortions TAB SAB Ect Mult Living                  Review of Systems  Unable to perform ROS Psychiatric/Behavioral: Positive for altered mental status.    Allergies    Hydromorphone hcl and Promethazine hcl  Home Medications   Current Outpatient Rx  Name Route Sig Dispense Refill  . GABAPENTIN 300 MG PO CAPS  TAKE 1 CAPSULE 3 TIMES A DAY 90 capsule 5    BP 113/59  Pulse 53  Resp 24  SpO2 100%  Physical Exam  Nursing note and vitals reviewed.  77 year old female who responds to painful stimuli but is nonverbal. Respiratory rate is 24 and even. Oxygen saturation is satisfactory 92% but while on supplemental oxygen. Pupils are 3 mm and sluggishly reactive. Gaze is fixed straight ahead oh with negative doll's eyes response. Neck is supple without adenopathy or bruit. Lungs have scattered coarse rhonchi but no rales or wheezes. Heart has regular rate and rhythm without murmur. Abdomen is soft and nontender. Extremities have no cyanosis or edema. Neurologic: She responds to painful stimuli by moving all extremities and by moaning. She does not have any purposeful movement. Movement on the right seems slightly more than movement on the left.  ED Course  Procedures (including critical care time)   Labs Reviewed  URINE RAPID DRUG SCREEN (HOSP PERFORMED)  URINALYSIS, ROUTINE W REFLEX MICROSCOPIC  PROTIME-INR  APTT  POCT CBG MONITORING  URINE CULTURE  CBC  DIFFERENTIAL  ETHANOL  COMPREHENSIVE METABOLIC PANEL   No results found.   No diagnosis found.  Results for orders placed during  the hospital encounter of 07/17/11  URINE RAPID DRUG SCREEN (HOSP PERFORMED)      Component Value Range   Opiates NONE DETECTED  NONE DETECTED    Cocaine NONE DETECTED  NONE DETECTED    Benzodiazepines POSITIVE (*) NONE DETECTED    Amphetamines NONE DETECTED  NONE DETECTED    Tetrahydrocannabinol NONE DETECTED  NONE DETECTED    Barbiturates NONE DETECTED  NONE DETECTED   URINALYSIS, ROUTINE W REFLEX MICROSCOPIC      Component Value Range   Color, Urine YELLOW  YELLOW    Appearance HAZY (*) CLEAR    Specific Gravity, Urine >1.030 (*) 1.005 - 1.030    pH 5.5   5.0 - 8.0    Glucose, UA NEGATIVE  NEGATIVE (mg/dL)   Hgb urine dipstick NEGATIVE  NEGATIVE    Bilirubin Urine SMALL (*) NEGATIVE    Ketones, ur TRACE (*) NEGATIVE (mg/dL)   Protein, ur 30 (*) NEGATIVE (mg/dL)   Urobilinogen, UA 0.2  0.0 - 1.0 (mg/dL)   Nitrite POSITIVE (*) NEGATIVE    Leukocytes, UA TRACE (*) NEGATIVE   PROTIME-INR      Component Value Range   Prothrombin Time 14.0  11.6 - 15.2 (seconds)   INR 1.06  0.00 - 1.49   APTT      Component Value Range   aPTT 29  24 - 37 (seconds)  CBC      Component Value Range   WBC 6.9  4.0 - 10.5 (K/uL)   RBC 3.78 (*) 3.87 - 5.11 (MIL/uL)   Hemoglobin 11.3 (*) 12.0 - 15.0 (g/dL)   HCT 35.1 (*) 36.0 - 46.0 (%)   MCV 92.9  78.0 - 100.0 (fL)   MCH 29.9  26.0 - 34.0 (pg)   MCHC 32.2  30.0 - 36.0 (g/dL)   RDW 14.3  11.5 - 15.5 (%)   Platelets 273  150 - 400 (K/uL)  DIFFERENTIAL      Component Value Range   Neutrophils Relative 69  43 - 77 (%)   Neutro Abs 4.8  1.7 - 7.7 (K/uL)   Lymphocytes Relative 23  12 - 46 (%)   Lymphs Abs 1.6  0.7 - 4.0 (K/uL)   Monocytes Relative 6  3 - 12 (%)   Monocytes Absolute 0.4  0.1 - 1.0 (K/uL)   Eosinophils Relative 1  0 - 5 (%)   Eosinophils Absolute 0.1  0.0 - 0.7 (K/uL)   Basophils Relative 1  0 - 1 (%)   Basophils Absolute 0.1  0.0 - 0.1 (K/uL)  ETHANOL      Component Value Range   Alcohol, Ethyl (B) <11  0 - 11 (mg/dL)  COMPREHENSIVE METABOLIC PANEL      Component Value Range   Sodium 142  135 - 145 (mEq/L)   Potassium 3.7  3.5 - 5.1 (mEq/L)   Chloride 107  96 - 112 (mEq/L)   CO2 27  19 - 32 (mEq/L)   Glucose, Bld 161 (*) 70 - 99 (mg/dL)   BUN 17  6 - 23 (mg/dL)   Creatinine, Ser 1.50 (*) 0.50 - 1.10 (mg/dL)   Calcium 8.8  8.4 - 10.5 (mg/dL)   Total Protein 6.3  6.0 - 8.3 (g/dL)   Albumin 3.3 (*) 3.5 - 5.2 (g/dL)   AST 11  0 - 37 (U/L)   ALT 7  0 - 35 (U/L)   Alkaline Phosphatase 76  39 - 117 (U/L)   Total Bilirubin 0.5  0.3 -  1.2 (mg/dL)   GFR calc non Af Amer 32 (*) >90  (mL/min)   GFR calc Af Amer 37 (*) >90 (mL/min)  GLUCOSE, CAPILLARY      Component Value Range   Glucose-Capillary 143 (*) 70 - 99 (mg/dL)   Comment 1 Notify RN    URINE MICROSCOPIC-ADD ON      Component Value Range   Squamous Epithelial / LPF RARE  RARE    WBC, UA 11-20  <3 (WBC/hpf)   Bacteria, UA MANY (*) RARE    Ct Head Wo Contrast (if Head Trauma)  07/17/2011  *RADIOLOGY REPORT*  Clinical Data: Found unresponsive.  CT HEAD WITHOUT CONTRAST  Technique:  Contiguous axial images were obtained from the base of the skull through the vertex without contrast.  Comparison: .  04/19/2011.  Findings: No intracranial hemorrhage.  No CT evidence of large acute infarct.  Small acute infarct cannot be excluded by CT.  No intracranial mass lesion detected on this unenhanced exam.  Prominent choroid plexus cysts which are calcified.  Small vessel disease type changes.  No hydrocephalus.  Partially empty sella.  Mucosal thickening maxillary sinuses and left sphenoid sinus air cells.  IMPRESSION: No intracranial hemorrhage or CT evidence of large acute infarct.  Original Report Authenticated By: Doug Sou, M.D.   Dg Chest Portable 1 View  07/17/2011  *RADIOLOGY REPORT*  Clinical Data: Patient unresponsive.  PORTABLE CHEST - 1 VIEW  Comparison: 03/08/2010  Findings: Lung volumes are very low.  No overt edema or infiltrate. Stable cardiomegaly.  No pleural effusions visualized.  Stable aortic tortuosity.  IMPRESSION: Very low lung volumes.  Original Report Authenticated By: Azzie Roup, M.D.       Date: 07/17/2011  Rate: 64  Rhythm: normal sinus rhythm  QRS Axis: normal  Intervals: normal  ST/T Wave abnormalities: nonspecific T wave changes  Conduction Disutrbances:none  Narrative Interpretation: Nonspecific T wave changes  Old EKG Reviewed: none available   Mental status has not changed during observation in the emergency department. All family members are present and agreed with DO  NOT INTUBATE status. Other than UTI, will workup has been unremarkable. Urine drug screen is positive for benzodiazepines but it is only qualitative. I discussed case with Dr. Luan Pulling and she will be admitted to ICU. If her condition is from an accidental overdose of benzodiazepine, she should improve over the next 6-12 hours.    CRITICAL CARE Performed by: [id number]   Total critical care time: 65 minutes  Critical care time was exclusive of separately billable procedures and treating other patients.  Critical care was necessary to treat or prevent imminent or life-threatening deterioration.  Critical care was time spent personally by me on the following activities: development of treatment plan with patient and/or surrogate as well as nursing, discussions with consultants, evaluation of patient's response to treatment, examination of patient, obtaining history from patient or surrogate, ordering and performing treatments and interventions, ordering and review of laboratory studies, ordering and review of radiographic studies, pulse oximetry and re-evaluation of patient's condition.   MDM  Altered mental status oh which may be due to a primary cerebral event or possibly due to overdose. Workup has been initiated.        Delora Fuel, MD [id number]

## 2011-07-17 NOTE — Progress Notes (Signed)
Up until this point patient has been only minimally responsive to deep sternal rub.  Patient now has family at bedside and she has started opening her eyes and trying to speak.  Family present again reiterates the opinion that this was not a suicide attempt.  No order for suicide precautions are in place at this time.  However, family has agreed to stay with patient as a precautionary effort.  We will keep close watch on patient and any expression of suicidal ideation.

## 2011-07-17 NOTE — ED Notes (Signed)
Chaplin here with family. Pt waiting to be eval for admission

## 2011-07-17 NOTE — ED Notes (Signed)
Per ems. Pt was found by family member lying on bed with xanax scattered on floor. Pt unresponsive at present

## 2011-07-17 NOTE — ED Notes (Signed)
MD at bedside. 

## 2011-07-17 NOTE — ED Notes (Signed)
Pt still remains unresponsive. Pupils are pinpoint and pt will not open them. Waiting to go over for CT

## 2011-07-17 NOTE — ED Notes (Signed)
Report given to sarah in icu. Dr Luan Pulling here to eval

## 2011-07-17 NOTE — Progress Notes (Signed)
Talked at length with daughter Carmon Ginsberg and son, Dorismar Basse about pt and whether she was depressed or had attempted suicide before or abused any substances. They all said that she had not and that she had started taking a different medication and they believe she was just confused and her behavior had changed since she had started taking it. But they do not believe she tried to commit suicide.

## 2011-07-18 ENCOUNTER — Inpatient Hospital Stay (HOSPITAL_COMMUNITY): Payer: Medicare Other

## 2011-07-18 LAB — GLUCOSE, CAPILLARY
Glucose-Capillary: 167 mg/dL — ABNORMAL HIGH (ref 70–99)
Glucose-Capillary: 118 mg/dL — ABNORMAL HIGH (ref 70–99)
Glucose-Capillary: 147 mg/dL — ABNORMAL HIGH (ref 70–99)

## 2011-07-18 LAB — BASIC METABOLIC PANEL
BUN: 15 mg/dL (ref 6–23)
CO2: 24 mEq/L (ref 19–32)
Calcium: 8.4 mg/dL (ref 8.4–10.5)
Glucose, Bld: 134 mg/dL — ABNORMAL HIGH (ref 70–99)
Sodium: 143 mEq/L (ref 135–145)

## 2011-07-18 LAB — CBC
MCH: 29.8 pg (ref 26.0–34.0)
MCHC: 31.8 g/dL (ref 30.0–36.0)
MCV: 93.9 fL (ref 78.0–100.0)
Platelets: 257 10*3/uL (ref 150–400)
RBC: 3.62 MIL/uL — ABNORMAL LOW (ref 3.87–5.11)

## 2011-07-18 LAB — BLOOD GAS, ARTERIAL
Acid-base deficit: 2.4 mmol/L — ABNORMAL HIGH (ref 0.0–2.0)
O2 Content: 2 L/min
TCO2: 20.9 mmol/L (ref 0–100)

## 2011-07-18 LAB — HEMOGLOBIN A1C
Hgb A1c MFr Bld: 7.9 % — ABNORMAL HIGH (ref <5.7)
Mean Plasma Glucose: 180 mg/dL — ABNORMAL HIGH (ref <117)

## 2011-07-18 MED ORDER — ENOXAPARIN SODIUM 40 MG/0.4ML ~~LOC~~ SOLN
40.0000 mg | SUBCUTANEOUS | Status: DC
Start: 2011-07-18 — End: 2011-07-21
  Administered 2011-07-18 – 2011-07-20 (×3): 40 mg via SUBCUTANEOUS
  Filled 2011-07-18 (×3): qty 0.4

## 2011-07-18 MED ORDER — SODIUM CHLORIDE 0.9 % IJ SOLN
INTRAMUSCULAR | Status: AC
  Filled 2011-07-18: qty 3

## 2011-07-18 MED ORDER — POTASSIUM CHLORIDE CRYS ER 20 MEQ PO TBCR
20.0000 meq | EXTENDED_RELEASE_TABLET | Freq: Two times a day (BID) | ORAL | Status: DC
  Administered 2011-07-18 – 2011-07-21 (×6): 20 meq via ORAL
  Filled 2011-07-18 (×6): qty 1

## 2011-07-18 NOTE — Progress Notes (Signed)
Lovenox adjusted to 40mg  sq daily for VTE prophylaxis. Estimated Creatinine Clearance: 41.2 ml/min (by C-G formula based on Cr of 1.22). 88.4kg [name] 07/18/2011 11:12 AM

## 2011-07-18 NOTE — Progress Notes (Signed)
Subjective: She is much more alert and awake. I think what happened is that she took her regular dose of Xanax and went back to sleep and then woke up and took more and this may have occurred on several occasions. She has been more confused according to her family so obviously were going to have to try to get some method of making sure her medications are appropriate. She is still sleepy but much improved.  Objective: Vital signs in last 24 hours: Temp:  [96.7 F (35.9 C)-97.8 F (36.6 C)] 97.7 F (36.5 C) (11/21 0704) Pulse Rate:  [40-72] 57  (11/21 0600) Resp:  [2-28] 17  (11/21 0600) BP: (89-137)/(46-72) 103/51 mmHg (11/21 0600) SpO2:  [73 %-100 %] 100 % (11/21 0704) FiO2 (%):  [100 %] 100 % (11/20 2039) Weight:  [85.3 kg (188 lb 0.8 oz)-88.4 kg (194 lb 14.2 oz)] 194 lb 14.2 oz (88.4 kg) (11/21 0300) Weight change:  Last BM Date: 07/16/11  Intake/Output from previous day: 11/20 0701 - 11/21 0700 In: 1025 [I.V.:975; IV Piggyback:50] Out: 650 [Urine:650]  PHYSICAL EXAM General appearance: no distress and moderately obese Resp: rhonchi bilaterally Cardio: regular rate and rhythm, S1, S2 normal, no murmur, click, rub or gallop GI: soft, non-tender; bowel sounds normal; no masses,  no organomegaly Extremities: extremities normal, atraumatic, no cyanosis or edema  Lab Results:    Basic Metabolic Panel:  Basename 07/18/11 0427 07/17/11 1345  NA 143 142  K 3.4* 3.7  CL 110 107  CO2 24 27  GLUCOSE 134* 161*  BUN 15 17  CREATININE 1.22* 1.50*  CALCIUM 8.4 8.8  MG -- --  PHOS -- --   Liver Function Tests:  Basename 07/17/11 1345  AST 11  ALT 7  ALKPHOS 76  BILITOT 0.5  PROT 6.3  ALBUMIN 3.3*   No results found for this basename: LIPASE:2,AMYLASE:2 in the last 72 hours  Basename 07/17/11 1835  AMMONIA 15   CBC:  Basename 07/18/11 0427 07/17/11 1345  WBC 7.4 6.9  NEUTROABS -- 4.8  HGB 10.8* 11.3*  HCT 34.0* 35.1*  MCV 93.9 92.9  PLT 257 273   Cardiac  Enzymes: No results found for this basename: CKTOTAL:3,CKMB:3,CKMBINDEX:3,TROPONINI:3 in the last 72 hours BNP: No results found for this basename: POCBNP:3 in the last 72 hours D-Dimer: No results found for this basename: DDIMER:2 in the last 72 hours CBG:  Basename 07/17/11 2211 07/17/11 1527  GLUCAP 96 143*   Hemoglobin A1C:  Basename 07/17/11 1836  HGBA1C 7.9*   Fasting Lipid Panel: No results found for this basename: CHOL,HDL,LDLCALC,TRIG,CHOLHDL,LDLDIRECT in the last 72 hours Thyroid Function Tests: No results found for this basename: TSH,T4TOTAL,FREET4,T3FREE,THYROIDAB in the last 72 hours Anemia Panel: No results found for this basename: VITAMINB12,FOLATE,FERRITIN,TIBC,IRON,RETICCTPCT in the last 72 hours Coagulation:  Basename 07/17/11 1345  LABPROT 14.0  INR 1.06   Urine Drug Screen:  Alcohol Level:  Basename 07/17/11 1345  ETH <11   Urinalysis:  Misc. Labs:  ABGS  Basename 07/18/11 0535  PHART 7.334*  PO2ART 88.3  TCO2 20.9  HCO3 22.6   CULTURES Recent Results (from the past 240 hour(s))  MRSA PCR SCREENING     Status: Normal   Collection Time   07/17/11  6:43 PM      Component Value Range Status Comment   MRSA by PCR NEGATIVE  NEGATIVE  Final    Studies/Results: Ct Head Wo Contrast (if Head Trauma)  07/17/2011  *RADIOLOGY REPORT*  Clinical Data: Found unresponsive.  CT  HEAD WITHOUT CONTRAST  Technique:  Contiguous axial images were obtained from the base of the skull through the vertex without contrast.  Comparison: .  04/19/2011.  Findings: No intracranial hemorrhage.  No CT evidence of large acute infarct.  Small acute infarct cannot be excluded by CT.  No intracranial mass lesion detected on this unenhanced exam.  Prominent choroid plexus cysts which are calcified.  Small vessel disease type changes.  No hydrocephalus.  Partially empty sella.  Mucosal thickening maxillary sinuses and left sphenoid sinus air cells.  IMPRESSION: No intracranial  hemorrhage or CT evidence of large acute infarct.  Original Report Authenticated By: Doug Sou, M.D.   Dg Chest Portable 1 View  07/17/2011  *RADIOLOGY REPORT*  Clinical Data: Patient unresponsive.  PORTABLE CHEST - 1 VIEW  Comparison: 03/08/2010  Findings: Lung volumes are very low.  No overt edema or infiltrate. Stable cardiomegaly.  No pleural effusions visualized.  Stable aortic tortuosity.  IMPRESSION: Very low lung volumes.  Original Report Authenticated By: Azzie Roup, M.D.    Medications:  Prior to Admission:  Prescriptions prior to admission  Medication Sig Dispense Refill  . amLODipine (NORVASC) 5 MG tablet Take 5 mg by mouth daily.        . brimonidine (ALPHAGAN P) 0.1 % SOLN Place 1 drop into both eyes 2 (two) times daily.        . dorzolamide (TRUSOPT) 2 % ophthalmic solution Place 1 drop into both eyes 2 (two) times daily.        . furosemide (LASIX) 20 MG tablet Take 20 mg by mouth daily.        Marland Kitchen glipiZIDE (GLUCOTROL) 5 MG tablet Take 5 mg by mouth daily.        Marland Kitchen glyBURIDE (DIABETA) 5 MG tablet Take 10 mg by mouth 2 (two) times daily.        Marland Kitchen ipratropium-albuterol (DUONEB) 0.5-2.5 (3) MG/3ML SOLN Take 3 mLs by nebulization every 4 (four) hours as needed. Shortness of Breath      . latanoprost (XALATAN) 0.005 % ophthalmic solution Place 1 drop into both eyes at bedtime.        Marland Kitchen lisinopril (PRINIVIL,ZESTRIL) 5 MG tablet Take 5 mg by mouth daily.        Marland Kitchen omeprazole (PRILOSEC) 20 MG capsule Take 20 mg by mouth daily.        . pravastatin (PRAVACHOL) 40 MG tablet Take 40 mg by mouth daily.        . simvastatin (ZOCOR) 40 MG tablet Take 40 mg by mouth at bedtime.        . ALPRAZolam (XANAX) 0.5 MG tablet Take 0.5 mg by mouth at bedtime as needed. Anxiety        Scheduled:   . albuterol  2.5 mg Nebulization Q6H  . cefTRIAXone (ROCEPHIN) IV  1 g Intravenous Q24H  . enoxaparin  30 mg Subcutaneous Q24H  . insulin aspart  0-15 Units Subcutaneous TID WC  .  ipratropium  0.5 mg Nebulization Q6H  . naloxone  2 mg Intravenous Once  . pneumococcal 23 valent vaccine  0.5 mL Intramuscular Tomorrow-1000  . sodium chloride      . DISCONTD: cefTRIAXone (ROCEPHIN) IM  1 g Intramuscular Once   Continuous:   . sodium chloride 75 mL/hr at 07/18/11 [id number]   [id number], acetaminophen, ondansetron (ZOFRAN) IV, ondansetron  Assesment: She has change in mental status probably from accidental overdose of her Xanax. Otherwise she has multiple other medical problems  including COPD hypertension diabetes and severe osteoarthritis. Active Problems:  * No active hospital problems. *     Plan: I will asked for neurology consultation be sure not missing some other problem and I'm going to have her get an MRI of the brain because she potentially could have had something like a stroke    LOS: 1 day   Tevan Marian L 07/18/2011, 8:03 AM

## 2011-07-18 NOTE — Progress Notes (Signed)
UR Chart Review Completed  

## 2011-07-19 LAB — URINE CULTURE
Colony Count: >=100000
Culture  Setup Time: 201211201800

## 2011-07-19 LAB — GLUCOSE, CAPILLARY: Glucose-Capillary: 227 mg/dL — ABNORMAL HIGH (ref 70–99)

## 2011-07-19 NOTE — Progress Notes (Signed)
Subjective: She is much better. She says she wants to go home. She has no new complaints and has done well.  Objective: Vital signs in last 24 hours: Temp:  [97.8 F (36.6 C)-99.7 F (37.6 C)] 98.7 F (37.1 C) (11/22 0400) Pulse Rate:  [61-84] 73  (11/22 0600) Resp:  [18-29] 24  (11/22 0600) BP: (103-167)/(40-107) 137/79 mmHg (11/22 0600) SpO2:  [93 %-98 %] 97 % (11/22 0745) Weight:  [88.6 kg (195 lb 5.2 oz)] 195 lb 5.2 oz (88.6 kg) (11/22 0500) Weight change: 3.3 kg (7 lb 4.4 oz) Last BM Date: 07/16/11  Intake/Output from previous day: 11/21 0701 - 11/22 0700 In: 1565 [P.O.:240; I.V.:1275; IV Piggyback:50] Out: [id number]  PHYSICAL EXAM General appearance: alert, cooperative and no distress Resp: clear to auscultation bilaterally Cardio: regular rate and rhythm, S1, S2 normal, no murmur, click, rub or gallop GI: soft, non-tender; bowel sounds normal; no masses,  no organomegaly Extremities: extremities normal, atraumatic, no cyanosis or edema  Lab Results:    Basic Metabolic Panel:  Basename 07/18/11 0427 07/17/11 1345  NA 143 142  K 3.4* 3.7  CL 110 107  CO2 24 27  GLUCOSE 134* 161*  BUN 15 17  CREATININE 1.22* 1.50*  CALCIUM 8.4 8.8  MG -- --  PHOS -- --   Liver Function Tests:  Basename 07/17/11 1345  AST 11  ALT 7  ALKPHOS 76  BILITOT 0.5  PROT 6.3  ALBUMIN 3.3*   No results found for this basename: LIPASE:2,AMYLASE:2 in the last 72 hours  Basename 07/17/11 1835  AMMONIA 15   CBC:  Basename 07/18/11 0427 07/17/11 1345  WBC 7.4 6.9  NEUTROABS -- 4.8  HGB 10.8* 11.3*  HCT 34.0* 35.1*  MCV 93.9 92.9  PLT 257 273   Cardiac Enzymes: No results found for this basename: CKTOTAL:3,CKMB:3,CKMBINDEX:3,TROPONINI:3 in the last 72 hours BNP: No results found for this basename: POCBNP:3 in the last 72 hours D-Dimer: No results found for this basename: DDIMER:2 in the last 72 hours CBG:  Basename 07/19/11 0739 07/18/11 2137 07/18/11 1609  07/18/11 1146 07/18/11 0818 07/17/11 2211  GLUCAP 171* 147* 118* 167* 101* 96   Hemoglobin A1C:  Basename 07/17/11 1836  HGBA1C 7.9*   Fasting Lipid Panel: No results found for this basename: CHOL,HDL,LDLCALC,TRIG,CHOLHDL,LDLDIRECT in the last 72 hours Thyroid Function Tests: No results found for this basename: TSH,T4TOTAL,FREET4,T3FREE,THYROIDAB in the last 72 hours Anemia Panel: No results found for this basename: VITAMINB12,FOLATE,FERRITIN,TIBC,IRON,RETICCTPCT in the last 72 hours Coagulation:  Basename 07/17/11 1345  LABPROT 14.0  INR 1.06   Urine Drug Screen:  Alcohol Level:  Basename 07/17/11 1345  ETH <11   Urinalysis:  Misc. Labs:  ABGS  Basename 07/18/11 0535  PHART 7.334*  PO2ART 88.3  TCO2 20.9  HCO3 22.6   CULTURES Recent Results (from the past 240 hour(s))  URINE CULTURE     Status: Normal   Collection Time   07/17/11  2:42 PM      Component Value Range Status Comment   Specimen Description URINE, CATHETERIZED   Final    Special Requests NONE   Final    Setup Time [id number]   Final    Colony Count >=100,000 COLONIES/ML   Final    Culture ESCHERICHIA COLI   Final    Report Status 07/19/2011 FINAL   Final    Organism ID, Bacteria ESCHERICHIA COLI   Final   MRSA PCR SCREENING     Status: Normal   Collection Time  07/17/11  6:43 PM      Component Value Range Status Comment   MRSA by PCR NEGATIVE  NEGATIVE  Final    Studies/Results: Ct Head Wo Contrast (if Head Trauma)  07/17/2011  *RADIOLOGY REPORT*  Clinical Data: Found unresponsive.  CT HEAD WITHOUT CONTRAST  Technique:  Contiguous axial images were obtained from the base of the skull through the vertex without contrast.  Comparison: .  04/19/2011.  Findings: No intracranial hemorrhage.  No CT evidence of large acute infarct.  Small acute infarct cannot be excluded by CT.  No intracranial mass lesion detected on this unenhanced exam.  Prominent choroid plexus cysts which are calcified.   Small vessel disease type changes.  No hydrocephalus.  Partially empty sella.  Mucosal thickening maxillary sinuses and left sphenoid sinus air cells.  IMPRESSION: No intracranial hemorrhage or CT evidence of large acute infarct.  Original Report Authenticated By: Doug Sou, M.D.   Mr Brain Wo Contrast  07/18/2011  *RADIOLOGY REPORT*  Clinical Data: Altered mental status.  MRI HEAD WITHOUT CONTRAST  Technique:  Multiplanar, multiecho pulse sequences of the brain and surrounding structures were obtained according to standard protocol without intravenous contrast.  Comparison: CT 07/17/2011  Findings: Age appropriate atrophy.  Mild chronic microvascular ischemic changes in the white matter.  Negative for acute infarct.  Negative for hemorrhage or mass lesion.  Prominent cord plexus cysts bilaterally appear benign.  Mild mucosal edema in the maxillary, ethmoid and sphenoid sinuses. No air-fluid level.  Mastoid sinus effusion, left greater than right.  IMPRESSION: Mild chronic microvascular ischemic change in the white matter.  No acute infarct or mass.  Chronic sinusitis.  Original Report Authenticated By: Truett Perna, M.D.   Dg Chest Portable 1 View  07/17/2011  *RADIOLOGY REPORT*  Clinical Data: Patient unresponsive.  PORTABLE CHEST - 1 VIEW  Comparison: 03/08/2010  Findings: Lung volumes are very low.  No overt edema or infiltrate. Stable cardiomegaly.  No pleural effusions visualized.  Stable aortic tortuosity.  IMPRESSION: Very low lung volumes.  Original Report Authenticated By: Azzie Roup, M.D.    Medications:  Prior to Admission:  Prescriptions prior to admission  Medication Sig Dispense Refill  . amLODipine (NORVASC) 5 MG tablet Take 5 mg by mouth daily.        . brimonidine (ALPHAGAN P) 0.1 % SOLN Place 1 drop into both eyes 2 (two) times daily.        . dorzolamide (TRUSOPT) 2 % ophthalmic solution Place 1 drop into both eyes 2 (two) times daily.        . furosemide (LASIX)  20 MG tablet Take 20 mg by mouth daily.        Marland Kitchen glipiZIDE (GLUCOTROL) 5 MG tablet Take 5 mg by mouth daily.        Marland Kitchen glyBURIDE (DIABETA) 5 MG tablet Take 10 mg by mouth 2 (two) times daily.        Marland Kitchen ipratropium-albuterol (DUONEB) 0.5-2.5 (3) MG/3ML SOLN Take 3 mLs by nebulization every 4 (four) hours as needed. Shortness of Breath      . latanoprost (XALATAN) 0.005 % ophthalmic solution Place 1 drop into both eyes at bedtime.        Marland Kitchen lisinopril (PRINIVIL,ZESTRIL) 5 MG tablet Take 5 mg by mouth daily.        Marland Kitchen omeprazole (PRILOSEC) 20 MG capsule Take 20 mg by mouth daily.        . pravastatin (PRAVACHOL) 40 MG tablet Take 40  mg by mouth daily.        . simvastatin (ZOCOR) 40 MG tablet Take 40 mg by mouth at bedtime.        . ALPRAZolam (XANAX) 0.5 MG tablet Take 0.5 mg by mouth at bedtime as needed. Anxiety        Scheduled:   . albuterol  2.5 mg Nebulization Q6H  . cefTRIAXone (ROCEPHIN) IV  1 g Intravenous Q24H  . enoxaparin  40 mg Subcutaneous Q24H  . insulin aspart  0-15 Units Subcutaneous TID WC  . ipratropium  0.5 mg Nebulization Q6H  . potassium chloride  20 mEq Oral BID  . sodium chloride      . sodium chloride      . DISCONTD: enoxaparin  30 mg Subcutaneous Q24H  . DISCONTD: pneumococcal 23 valent vaccine  0.5 mL Intramuscular Tomorrow-1000   Continuous:   . sodium chloride 75 mL/hr at 07/18/11 1900   [id number], acetaminophen, ondansetron (ZOFRAN) IV, ondansetron  Assesment: She had an episode of altered mental status was probably multifactorial. She seems better and I don't think she needs to be in the step down unit any longer. Active Problems:  * No active hospital problems. *     Plan: I will transfer her from the step down unit to a telemetry bed. We'll try get her up and moving some. Continue with her other treatments.    LOS: 2 days   Makaela Cando L 07/19/2011, 8:32 AM

## 2011-07-20 LAB — GLUCOSE, CAPILLARY
Glucose-Capillary: 227 mg/dL — ABNORMAL HIGH (ref 70–99)
Glucose-Capillary: 189 mg/dL — ABNORMAL HIGH (ref 70–99)

## 2011-07-20 MED ORDER — ROSUVASTATIN CALCIUM 20 MG PO TABS
10.0000 mg | ORAL_TABLET | Freq: Every day | ORAL | Status: DC
  Administered 2011-07-20: 10 mg via ORAL
  Filled 2011-07-20: qty 1

## 2011-07-20 MED ORDER — AMLODIPINE BESYLATE 5 MG PO TABS
5.0000 mg | ORAL_TABLET | Freq: Every day | ORAL | Status: DC
  Administered 2011-07-20 – 2011-07-21 (×2): 5 mg via ORAL
  Filled 2011-07-20 (×2): qty 1

## 2011-07-20 MED ORDER — PANTOPRAZOLE SODIUM 40 MG PO TBEC
40.0000 mg | DELAYED_RELEASE_TABLET | Freq: Every day | ORAL | Status: DC
  Administered 2011-07-20: 40 mg via ORAL
  Filled 2011-07-20: qty 1

## 2011-07-20 MED ORDER — LISINOPRIL 5 MG PO TABS
5.0000 mg | ORAL_TABLET | Freq: Every day | ORAL | Status: DC
  Administered 2011-07-20 – 2011-07-21 (×2): 5 mg via ORAL
  Filled 2011-07-20 (×2): qty 1

## 2011-07-20 MED ORDER — DORZOLAMIDE HCL 2 % OP SOLN
1.0000 [drp] | Freq: Two times a day (BID) | OPHTHALMIC | Status: DC
  Administered 2011-07-20 – 2011-07-21 (×2): 1 [drp] via OPHTHALMIC
  Filled 2011-07-20: qty 10

## 2011-07-20 MED ORDER — ALPRAZOLAM 0.5 MG PO TABS: 0.5000 mg | ORAL_TABLET | Freq: Two times a day (BID) | ORAL | Status: DC | PRN

## 2011-07-20 MED ORDER — LATANOPROST 0.005 % OP SOLN
1.0000 [drp] | Freq: Every day | OPHTHALMIC | Status: DC
  Filled 2011-07-20: qty 2.5

## 2011-07-20 MED ORDER — SIMVASTATIN 20 MG PO TABS: 40.0000 mg | ORAL_TABLET | Freq: Every day | ORAL | Status: DC

## 2011-07-20 MED ORDER — BRIMONIDINE TARTRATE 0.15 % OP SOLN
1.0000 [drp] | Freq: Two times a day (BID) | OPHTHALMIC | Status: DC
  Administered 2011-07-20 – 2011-07-21 (×2): 1 [drp] via OPHTHALMIC
  Filled 2011-07-20: qty 5

## 2011-07-20 NOTE — Progress Notes (Signed)
Subjective: She is overall better. She has no new complaints. She is awake and alert  Objective: Vital signs in last 24 hours: Temp:  [97.7 F (36.5 C)-97.8 F (36.6 C)] 97.8 F (36.6 C) (11/22 2139) Pulse Rate:  [65-72] 69  (11/22 2139) Resp:  [17-21] 18  (11/22 2139) BP: (162-175)/(77-91) 162/77 mmHg (11/22 2139) SpO2:  [96 %-98 %] 98 % (11/23 0815) Weight:  [89.4 kg (197 lb 1.5 oz)] 197 lb 1.5 oz (89.4 kg) (11/23 [id number]) Weight change: 0.8 kg (1 lb 12.2 oz) Last BM Date: 07/18/11  Intake/Output from previous day: 11/22 0701 - 11/23 0700 In: 450 [I.V.:450] Out: 200 [Urine:200]  PHYSICAL EXAM General appearance: alert, cooperative, no distress and moderately obese Resp: clear to auscultation bilaterally Cardio: regular rate and rhythm, S1, S2 normal, no murmur, click, rub or gallop GI: soft, non-tender; bowel sounds normal; no masses,  no organomegaly Extremities: extremities normal, atraumatic, no cyanosis or edema  Lab Results:    Basic Metabolic Panel:  Basename 07/18/11 0427 07/17/11 1345  NA 143 142  K 3.4* 3.7  CL 110 107  CO2 24 27  GLUCOSE 134* 161*  BUN 15 17  CREATININE 1.22* 1.50*  CALCIUM 8.4 8.8  MG -- --  PHOS -- --   Liver Function Tests:  Basename 07/17/11 1345  AST 11  ALT 7  ALKPHOS 76  BILITOT 0.5  PROT 6.3  ALBUMIN 3.3*   No results found for this basename: LIPASE:2,AMYLASE:2 in the last 72 hours  Basename 07/17/11 1835  AMMONIA 15   CBC:  Basename 07/18/11 0427 07/17/11 1345  WBC 7.4 6.9  NEUTROABS -- 4.8  HGB 10.8* 11.3*  HCT 34.0* 35.1*  MCV 93.9 92.9  PLT 257 273   Cardiac Enzymes: No results found for this basename: CKTOTAL:3,CKMB:3,CKMBINDEX:3,TROPONINI:3 in the last 72 hours BNP: No results found for this basename: POCBNP:3 in the last 72 hours D-Dimer: No results found for this basename: DDIMER:2 in the last 72 hours CBG:  Basename 07/20/11 0721 07/19/11 2138 07/19/11 1604 07/19/11 1141 07/19/11 0739 07/18/11  2137  GLUCAP 122* 129* 227* 204* 171* 147*   Hemoglobin A1C:  Basename 07/17/11 1836  HGBA1C 7.9*   Fasting Lipid Panel: No results found for this basename: CHOL,HDL,LDLCALC,TRIG,CHOLHDL,LDLDIRECT in the last 72 hours Thyroid Function Tests: No results found for this basename: TSH,T4TOTAL,FREET4,T3FREE,THYROIDAB in the last 72 hours Anemia Panel: No results found for this basename: VITAMINB12,FOLATE,FERRITIN,TIBC,IRON,RETICCTPCT in the last 72 hours Coagulation:  Basename 07/17/11 1345  LABPROT 14.0  INR 1.06   Urine Drug Screen: Drugs of Abuse     Component Value Date/Time   LABOPIA NONE DETECTED 07/17/2011 1442   COCAINSCRNUR NONE DETECTED 07/17/2011 1442   LABBENZ POSITIVE* 07/17/2011 1442   AMPHETMU NONE DETECTED 07/17/2011 1442   THCU NONE DETECTED 07/17/2011 1442   LABBARB NONE DETECTED 07/17/2011 1442    Alcohol Level:  Basename 07/17/11 1345  ETH <11   Urinalysis:  Misc. Labs:  ABGS  Basename 07/18/11 0535  PHART 7.334*  PO2ART 88.3  TCO2 20.9  HCO3 22.6   CULTURES Recent Results (from the past 240 hour(s))  URINE CULTURE     Status: Normal   Collection Time   07/17/11  2:42 PM      Component Value Range Status Comment   Specimen Description URINE, CATHETERIZED   Final    Special Requests NONE   Final    Setup Time [id number]   Final    Colony Count >=100,000 COLONIES/ML   Final  Culture ESCHERICHIA COLI   Final    Report Status 07/19/2011 FINAL   Final    Organism ID, Bacteria ESCHERICHIA COLI   Final   MRSA PCR SCREENING     Status: Normal   Collection Time   07/17/11  6:43 PM      Component Value Range Status Comment   MRSA by PCR NEGATIVE  NEGATIVE  Final    Studies/Results: Mr Brain Wo Contrast  07/18/2011  *RADIOLOGY REPORT*  Clinical Data: Altered mental status.  MRI HEAD WITHOUT CONTRAST  Technique:  Multiplanar, multiecho pulse sequences of the brain and surrounding structures were obtained according to standard protocol  without intravenous contrast.  Comparison: CT 07/17/2011  Findings: Age appropriate atrophy.  Mild chronic microvascular ischemic changes in the white matter.  Negative for acute infarct.  Negative for hemorrhage or mass lesion.  Prominent cord plexus cysts bilaterally appear benign.  Mild mucosal edema in the maxillary, ethmoid and sphenoid sinuses. No air-fluid level.  Mastoid sinus effusion, left greater than right.  IMPRESSION: Mild chronic microvascular ischemic change in the white matter.  No acute infarct or mass.  Chronic sinusitis.  Original Report Authenticated By: Truett Perna, M.D.    Medications:  Prior to Admission:  Prescriptions prior to admission  Medication Sig Dispense Refill  . amLODipine (NORVASC) 5 MG tablet Take 5 mg by mouth daily.        . brimonidine (ALPHAGAN P) 0.1 % SOLN Place 1 drop into both eyes 2 (two) times daily.        . dorzolamide (TRUSOPT) 2 % ophthalmic solution Place 1 drop into both eyes 2 (two) times daily.        . furosemide (LASIX) 20 MG tablet Take 20 mg by mouth daily.        Marland Kitchen glipiZIDE (GLUCOTROL) 5 MG tablet Take 5 mg by mouth daily.        Marland Kitchen glyBURIDE (DIABETA) 5 MG tablet Take 10 mg by mouth 2 (two) times daily.        Marland Kitchen ipratropium-albuterol (DUONEB) 0.5-2.5 (3) MG/3ML SOLN Take 3 mLs by nebulization every 4 (four) hours as needed. Shortness of Breath      . latanoprost (XALATAN) 0.005 % ophthalmic solution Place 1 drop into both eyes at bedtime.        Marland Kitchen lisinopril (PRINIVIL,ZESTRIL) 5 MG tablet Take 5 mg by mouth daily.        Marland Kitchen omeprazole (PRILOSEC) 20 MG capsule Take 20 mg by mouth daily.        . pravastatin (PRAVACHOL) 40 MG tablet Take 40 mg by mouth daily.        . simvastatin (ZOCOR) 40 MG tablet Take 40 mg by mouth at bedtime.        . ALPRAZolam (XANAX) 0.5 MG tablet Take 0.5 mg by mouth at bedtime as needed. Anxiety        Scheduled:   . albuterol  2.5 mg Nebulization Q6H  . amLODipine  5 mg Oral Daily  . brimonidine  1 drop  Both Eyes BID  . cefTRIAXone (ROCEPHIN) IV  1 g Intravenous Q24H  . dorzolamide  1 drop Both Eyes BID  . enoxaparin  40 mg Subcutaneous Q24H  . insulin aspart  0-15 Units Subcutaneous TID WC  . ipratropium  0.5 mg Nebulization Q6H  . latanoprost  1 drop Both Eyes QHS  . lisinopril  5 mg Oral Daily  . potassium chloride  20 mEq Oral BID  .  simvastatin  40 mg Oral q1800   Continuous:   . sodium chloride 75 mL/hr at 07/19/11 1200   [id number], acetaminophen, ALPRAZolam, ondansetron (ZOFRAN) IV, ondansetron  Assesment: Tendon episode of altered mental status but is much improved. This is probably from an accidental overdose of Xanax. Active Problems:  * No active hospital problems. *     Plan: I will try to get her up do some physical therapy I think she'll probably be able to go home tomorrow    LOS: 3 days   [name] 07/20/2011, 8:18 AM

## 2011-07-20 NOTE — Progress Notes (Signed)
Physical Therapy Evaluation Patient Details Name: [name] MRN: [id number] DOB: 11-18-1928 Today's Date: 07/20/2011 Time: [id number].  Charges: 1 eval Problem List:  Patient Active Problem List  Diagnoses  . DIABETES  . CUBITAL TUNNEL SYNDROME  . KNEE, ARTHRITIS, DEGEN./OSTEO  . SHOULDER PAIN  . HIP PAIN  . KNEE PAIN  . NECK PAIN  . SCIATICA  . RUPTURE ROTATOR CUFF  . TOTAL HIP FOLLOW-UP    Past Medical History:  Past Medical History  Diagnosis Date  . COPD (chronic obstructive pulmonary disease)   . Diabetes mellitus   . Depressed    Past Surgical History: History reviewed. No pertinent past surgical history.  PT Assessment/Plan/Recommendation PT Assessment Clinical Impression Statement: Pt is a pleasant 77 year old female who was referred to PT for evaluation of mobilty.  After examination it was found that she currently does not have any mobility dysfunction and all education has been provided for her.  Recommend HHPT to evaluate safety while at home.  PT Recommendation/Assessment: Patent does not need any further PT services PT Recommendation Follow Up Recommendations: Home health PT PT Goals     PT Evaluation Precautions/Restrictions    Prior Functioning  Home Living Lives With: Alone;Family Receives Help From: Family Type of Home: House Home Layout: One level Home Access: Ramped entrance Bathroom Shower/Tub: Multimedia programmer: [place]: Grab bars in shower;Built-in shower seat;Walker - rolling Additional Comments: Pt lives alone, however daughter lives next door.  She has family over throughout the day on most day.  Prior Function Level of Independence: Independent with basic ADLs;Requires assistive device for independence Driving: Yes Comments: Enjoys driving short distances to go shopping and walking in the community.  Cognition Cognition Arousal/Alertness: Awake/alert Sensation/Coordination   Extremity  Assessment RLE Assessment RLE Assessment: Within Functional Limits LLE Assessment LLE Assessment: Within Functional Limits Mobility (including Balance) Bed Mobility Bed Mobility: Yes Supine to Sit: 7: Independent Sitting - Scoot to Edge of Bed: 7: Independent Sit to Supine - Right: 7: Independent Transfers Transfers: Yes Sit to Stand: 7: Independent Stand to Sit: 7: Independent Ambulation/Gait Ambulation/Gait: Yes Ambulation/Gait Assistance: 7: Independent Ambulation Distance (Feet): 500 Feet Assistive device: Rolling walker Gait Pattern: Within Functional Limits Stairs: No (ramped entrance) Wheelchair Mobility Wheelchair Mobility: No  Posture/Postural Control Posture/Postural Control: No significant limitations Balance Balance Assessed: Yes Dynamic Sitting Balance Dynamic Sitting - Comments: Able to sit on EOB unsupported and don LE clothing without difficulty.  Sits on EOB without UE or LE support without difficulty.  Exercise    End of Session PT - End of Session Equipment Utilized During Treatment: Gait belt Activity Tolerance: Patient tolerated treatment well Patient left: in bed;with call bell in reach (Sitting on EOB w/family present)  [name] 07/20/2011, 9:41 AM

## 2011-07-20 NOTE — Progress Notes (Signed)
Notified Dr. Everette Rank for pt request for pt c/o sour stomach she says she normally takes omperalozole at home. New orders given called.

## 2011-07-20 NOTE — Progress Notes (Signed)
Patient converted from Simvastatin 40 mg prescription, to Rosuvastatin 10 mg prescription, per therapeutic interchange authorized by hospital policy, due to increase risk of rhabdomyolysis for patient taking Amlodipine and Simvastatin > 20 mg/day.

## 2011-07-21 DIAGNOSIS — R4182 Altered mental status, unspecified: Secondary | ICD-10-CM | POA: Diagnosis present

## 2011-07-21 DIAGNOSIS — J449 Chronic obstructive pulmonary disease, unspecified: Secondary | ICD-10-CM | POA: Diagnosis present

## 2011-07-21 DIAGNOSIS — N390 Urinary tract infection, site not specified: Secondary | ICD-10-CM | POA: Diagnosis present

## 2011-07-21 DIAGNOSIS — M1990 Unspecified osteoarthritis, unspecified site: Secondary | ICD-10-CM | POA: Diagnosis present

## 2011-07-21 MED ORDER — CEFUROXIME AXETIL 250 MG PO TABS: 250.0000 mg | ORAL_TABLET | Freq: Two times a day (BID) | ORAL | Status: AC

## 2011-07-21 NOTE — Discharge Summary (Signed)
Physician Discharge Summary  Patient ID: [name] MRN: [id number] DOB/AGE: 75-Dec-1930 75 y.o. Primary Care Physician:Randy Whitener L, MD Admit date: 07/17/2011 Discharge date: 07/21/2011    Discharge Diagnoses:   Principal Problem:  *Altered mental status Active Problems:  DIABETES  Osteoarthritis  COPD (chronic obstructive pulmonary disease)  UTI (lower urinary tract infection)   Current Discharge Medication List    START taking these medications   Details  cefUROXime (CEFTIN) 250 MG tablet Take 1 tablet (250 mg total) by mouth 2 (two) times daily. Qty: 20 tablet, Refills: 0      CONTINUE these medications which have NOT CHANGED   Details  amLODipine (NORVASC) 5 MG tablet Take 5 mg by mouth daily.      brimonidine (ALPHAGAN P) 0.1 % SOLN Place 1 drop into both eyes 2 (two) times daily.      dorzolamide (TRUSOPT) 2 % ophthalmic solution Place 1 drop into both eyes 2 (two) times daily.      furosemide (LASIX) 20 MG tablet Take 20 mg by mouth daily.      glipiZIDE (GLUCOTROL) 5 MG tablet Take 5 mg by mouth daily.      glyBURIDE (DIABETA) 5 MG tablet Take 10 mg by mouth 2 (two) times daily.      ipratropium-albuterol (DUONEB) 0.5-2.5 (3) MG/3ML SOLN Take 3 mLs by nebulization every 4 (four) hours as needed. Shortness of Breath    latanoprost (XALATAN) 0.005 % ophthalmic solution Place 1 drop into both eyes at bedtime.      lisinopril (PRINIVIL,ZESTRIL) 5 MG tablet Take 5 mg by mouth daily.      omeprazole (PRILOSEC) 20 MG capsule Take 20 mg by mouth daily.      pravastatin (PRAVACHOL) 40 MG tablet Take 40 mg by mouth daily.      simvastatin (ZOCOR) 40 MG tablet Take 40 mg by mouth at bedtime.      ALPRAZolam (XANAX) 0.5 MG tablet Take 0.5 mg by mouth at bedtime as needed. Anxiety         Discharged Condition: Improved    Consults: None  Significant Diagnostic Studies: Ct Head Wo Contrast (if Head Trauma)  07/17/2011  *RADIOLOGY REPORT*  Clinical  Data: Found unresponsive.  CT HEAD WITHOUT CONTRAST  Technique:  Contiguous axial images were obtained from the base of the skull through the vertex without contrast.  Comparison: .  04/19/2011.  Findings: No intracranial hemorrhage.  No CT evidence of large acute infarct.  Small acute infarct cannot be excluded by CT.  No intracranial mass lesion detected on this unenhanced exam.  Prominent choroid plexus cysts which are calcified.  Small vessel disease type changes.  No hydrocephalus.  Partially empty sella.  Mucosal thickening maxillary sinuses and left sphenoid sinus air cells.  IMPRESSION: No intracranial hemorrhage or CT evidence of large acute infarct.  Original Report Authenticated By: Doug Sou, M.D.   Mr Brain Wo Contrast  07/18/2011  *RADIOLOGY REPORT*  Clinical Data: Altered mental status.  MRI HEAD WITHOUT CONTRAST  Technique:  Multiplanar, multiecho pulse sequences of the brain and surrounding structures were obtained according to standard protocol without intravenous contrast.  Comparison: CT 07/17/2011  Findings: Age appropriate atrophy.  Mild chronic microvascular ischemic changes in the white matter.  Negative for acute infarct.  Negative for hemorrhage or mass lesion.  Prominent cord plexus cysts bilaterally appear benign.  Mild mucosal edema in the maxillary, ethmoid and sphenoid sinuses. No air-fluid level.  Mastoid sinus effusion, left greater than right.  IMPRESSION: Mild chronic microvascular ischemic change in the white matter.  No acute infarct or mass.  Chronic sinusitis.  Original Report Authenticated By: Truett Perna, M.D.   Dg Chest Portable 1 View  07/17/2011  *RADIOLOGY REPORT*  Clinical Data: Patient unresponsive.  PORTABLE CHEST - 1 VIEW  Comparison: 03/08/2010  Findings: Lung volumes are very low.  No overt edema or infiltrate. Stable cardiomegaly.  No pleural effusions visualized.  Stable aortic tortuosity.  IMPRESSION: Very low lung volumes.  Original Report  Authenticated By: Azzie Roup, M.D.    Lab Results: Basic Metabolic Panel: No results found for this basename: NA:2,K:2,CL:2,CO2:2,GLUCOSE:2,BUN:2,CREATININE:2,CALCIUM:2,MG:2,PHOS:2 in the last 72 hours Liver Function Tests: No results found for this basename: AST:2,ALT:2,ALKPHOS:2,BILITOT:2,PROT:2,ALBUMIN:2 in the last 72 hours No results found for this basename: LIPASE:2,AMYLASE:2 in the last 72 hours No results found for this basename: AMMONIA:2 in the last 72 hours CBC: No results found for this basename: WBC:2,NEUTROABS:2,HGB:2,HCT:2,MCV:2,PLT:2 in the last 72 hours  Recent Results (from the past 240 hour(s))  URINE CULTURE     Status: Normal   Collection Time   07/17/11  2:42 PM      Component Value Range Status Comment   Specimen Description URINE, CATHETERIZED   Final    Special Requests NONE   Final    Setup Time [id number]   Final    Colony Count >=100,000 COLONIES/ML   Final    Culture ESCHERICHIA COLI   Final    Report Status 07/19/2011 FINAL   Final    Organism ID, Bacteria ESCHERICHIA COLI   Final   MRSA PCR SCREENING     Status: Normal   Collection Time   07/17/11  6:43 PM      Component Value Range Status Comment   MRSA by PCR NEGATIVE  NEGATIVE  Final      Hospital Course: She was admitted because of altered mental status. She was found to be confused at home. She had a bottle of Xanax beside her. She was taken to the intensive care unit and monitored closely and became more alert over the next 24 hours. She was noted to have a urinary tract infection. It appeared that what happened was that she had got up taking the Xanax fell back asleep and took another and this may have happened several times. The urinary tract infection also probably caused some problems. By the time of discharge she had been evaluated by physical therapy was awake and alert and back to her baseline.  Discharge Exam: Blood pressure 149/85, pulse 67, temperature 98.1 F (36.7 C),  temperature source Oral, resp. rate 20, height 5\' 6"  (1.676 m), weight 89.5 kg (197 lb 5 oz), SpO2 95.00%. She is awake and alert her chest is clear her heart is regular.  Disposition: Home with home health services. Some of her family members are now going to be in charge of her medication use.  Discharge Orders    Future Orders Please Complete By Expires   Home Health      Questions: Responses:   To provide the following care/treatments PT    RN   Face-to-face encounter      Comments:   I Rachael Ferrie L certify that this patient is under my care and that I, or a nurse practitioner or physician's assistant working with me, had a face-to-face encounter that meets the physician face-to-face encounter requirements with this patient on 07/21/2011.       Questions: Responses:   The encounter with the patient  was in whole, or in part, for the following medical condition, which is the primary reason for home health care altered mental status   I certify that, based on my findings, the following services are medically necessary home health services Nursing    Physical therapy   My clinical findings support the need for the above services Complex treatment plan/patient with lack knowledge disease process and treatment   Further, I certify that my clinical findings support that this patient is homebound (i.e. absences from home require considerable and taxing effort and are for medical reasons or religious services or infrequently or of short duration when for other reasons) Mental confusion   To provide the following care/treatments PT    RN   Discharge patient           Signed: Alonza Bogus 07/21/2011, 8:37 AM

## 2011-07-21 NOTE — Progress Notes (Signed)
Subjective: She says she feels well and wants to go home. She has no new complaints  Objective: Vital signs in last 24 hours: Temp:  [97.1 F (36.2 C)-98.4 F (36.9 C)] 98.1 F (36.7 C) (11/24 0617) Pulse Rate:  [65-67] 67  (11/24 0617) Resp:  [20] 20  (11/24 0617) BP: (149-154)/(69-85) 149/85 mmHg (11/24 0617) SpO2:  [95 %-96 %] 95 % (11/24 0617) FiO2 (%):  [21 %] 21 % (11/23 1442) Weight:  [89.3 kg (196 lb 13.9 oz)-89.5 kg (197 lb 5 oz)] 197 lb 5 oz (89.5 kg) (11/24 0617) Weight change: -0.1 kg (-3.5 oz) Last BM Date: 07/18/11  Intake/Output from previous day: 11/23 0701 - 11/24 0700 In: 600 [P.O.:600] Out: -   PHYSICAL EXAM General appearance: alert, cooperative and no distress Resp: clear to auscultation bilaterally Cardio: regular rate and rhythm, S1, S2 normal, no murmur, click, rub or gallop GI: soft, non-tender; bowel sounds normal; no masses,  no organomegaly Extremities: extremities normal, atraumatic, no cyanosis or edema  Lab Results:    Basic Metabolic Panel: No results found for this basename: NA:2,K:2,CL:2,CO2:2,GLUCOSE:2,BUN:2,CREATININE:2,CALCIUM:2,MG:2,PHOS:2 in the last 72 hours Liver Function Tests: No results found for this basename: AST:2,ALT:2,ALKPHOS:2,BILITOT:2,PROT:2,ALBUMIN:2 in the last 72 hours No results found for this basename: LIPASE:2,AMYLASE:2 in the last 72 hours No results found for this basename: AMMONIA:2 in the last 72 hours CBC: No results found for this basename: WBC:2,NEUTROABS:2,HGB:2,HCT:2,MCV:2,PLT:2 in the last 72 hours Cardiac Enzymes: No results found for this basename: CKTOTAL:3,CKMB:3,CKMBINDEX:3,TROPONINI:3 in the last 72 hours BNP: No results found for this basename: POCBNP:3 in the last 72 hours D-Dimer: No results found for this basename: DDIMER:2 in the last 72 hours CBG:  Basename 07/21/11 0747 07/20/11 2127 07/20/11 1654 07/20/11 1126 07/20/11 0721 07/19/11 2138  GLUCAP 136* 207* 189* 227* 122* 129*    Hemoglobin A1C: No results found for this basename: HGBA1C in the last 72 hours Fasting Lipid Panel: No results found for this basename: CHOL,HDL,LDLCALC,TRIG,CHOLHDL,LDLDIRECT in the last 72 hours Thyroid Function Tests: No results found for this basename: TSH,T4TOTAL,FREET4,T3FREE,THYROIDAB in the last 72 hours Anemia Panel: No results found for this basename: VITAMINB12,FOLATE,FERRITIN,TIBC,IRON,RETICCTPCT in the last 72 hours Coagulation: No results found for this basename: LABPROT:2,INR:2 in the last 72 hours Urine Drug Screen: Drugs of Abuse     Component Value Date/Time   LABOPIA NONE DETECTED 07/17/2011 [place] 07/17/2011 1442   LABBENZ POSITIVE* 07/17/2011 1442   AMPHETMU NONE DETECTED 07/17/2011 1442   THCU NONE DETECTED 07/17/2011 1442   LABBARB NONE DETECTED 07/17/2011 1442    Alcohol Level: No results found for this basename: ETH:2 in the last 72 hours Urinalysis:  Misc. Labs:  ABGS No results found for this basename: PHART,PCO2,PO2ART,TCO2,HCO3 in the last 72 hours CULTURES Recent Results (from the past 240 hour(s))  URINE CULTURE     Status: Normal   Collection Time   07/17/11  2:42 PM      Component Value Range Status Comment   Specimen Description URINE, CATHETERIZED   Final    Special Requests NONE   Final    Setup Time [id number]   Final    Colony Count >=100,000 COLONIES/ML   Final    Culture ESCHERICHIA COLI   Final    Report Status 07/19/2011 FINAL   Final    Organism ID, Bacteria ESCHERICHIA COLI   Final   MRSA PCR SCREENING     Status: Normal   Collection Time   07/17/11  6:43 PM  Component Value Range Status Comment   MRSA by PCR NEGATIVE  NEGATIVE  Final    Studies/Results: No results found.  Medications:  Prior to Admission:  Prescriptions prior to admission  Medication Sig Dispense Refill  . amLODipine (NORVASC) 5 MG tablet Take 5 mg by mouth daily.        . brimonidine (ALPHAGAN P) 0.1 % SOLN  Place 1 drop into both eyes 2 (two) times daily.        . dorzolamide (TRUSOPT) 2 % ophthalmic solution Place 1 drop into both eyes 2 (two) times daily.        . furosemide (LASIX) 20 MG tablet Take 20 mg by mouth daily.        Marland Kitchen glipiZIDE (GLUCOTROL) 5 MG tablet Take 5 mg by mouth daily.        Marland Kitchen glyBURIDE (DIABETA) 5 MG tablet Take 10 mg by mouth 2 (two) times daily.        Marland Kitchen ipratropium-albuterol (DUONEB) 0.5-2.5 (3) MG/3ML SOLN Take 3 mLs by nebulization every 4 (four) hours as needed. Shortness of Breath      . latanoprost (XALATAN) 0.005 % ophthalmic solution Place 1 drop into both eyes at bedtime.        Marland Kitchen lisinopril (PRINIVIL,ZESTRIL) 5 MG tablet Take 5 mg by mouth daily.        Marland Kitchen omeprazole (PRILOSEC) 20 MG capsule Take 20 mg by mouth daily.        . pravastatin (PRAVACHOL) 40 MG tablet Take 40 mg by mouth daily.        . simvastatin (ZOCOR) 40 MG tablet Take 40 mg by mouth at bedtime.        . ALPRAZolam (XANAX) 0.5 MG tablet Take 0.5 mg by mouth at bedtime as needed. Anxiety        Scheduled:   . albuterol  2.5 mg Nebulization Q6H  . amLODipine  5 mg Oral Daily  . brimonidine  1 drop Both Eyes BID  . cefTRIAXone (ROCEPHIN) IV  1 g Intravenous Q24H  . dorzolamide  1 drop Both Eyes BID  . enoxaparin  40 mg Subcutaneous Q24H  . insulin aspart  0-15 Units Subcutaneous TID WC  . ipratropium  0.5 mg Nebulization Q6H  . latanoprost  1 drop Both Eyes QHS  . lisinopril  5 mg Oral Daily  . pantoprazole  40 mg Oral Q1200  . potassium chloride  20 mEq Oral BID  . rosuvastatin  10 mg Oral q1800  . DISCONTD: simvastatin  40 mg Oral q1800   Continuous:   . sodium chloride 1,000 mL (07/20/11 2005)   [id number], acetaminophen, ALPRAZolam, ondansetron (ZOFRAN) IV, ondansetron  Assesment: She has had change in mental status partially from a UTI and partially from taking too much of her medication. She is much improved and ready for discharge Active Problems:  * No active hospital  problems. *     Plan: Discharge home    LOS: 4 days   Shatara Stanek L 07/21/2011, 8:33 AM

## 2011-07-21 NOTE — Progress Notes (Signed)
Writer discussed and reviewed discharge instructions and medications with pt, verbalized understanding.  Encouraged to call with any questions that may arise.  Pt took all belongings and family members present to assist in discharge as well.  [facility] and [facility] for [facility] to be arranged for pt.  Insulin pen given as well to take home.

## 2011-09-10 DIAGNOSIS — H2589 Other age-related cataract: Secondary | ICD-10-CM | POA: Insufficient documentation

## 2011-09-10 DIAGNOSIS — H4011X0 Primary open-angle glaucoma, stage unspecified: Secondary | ICD-10-CM | POA: Diagnosis not present

## 2011-09-10 DIAGNOSIS — H409 Unspecified glaucoma: Secondary | ICD-10-CM | POA: Diagnosis not present

## 2011-09-19 DIAGNOSIS — L570 Actinic keratosis: Secondary | ICD-10-CM | POA: Diagnosis not present

## 2011-09-19 DIAGNOSIS — L821 Other seborrheic keratosis: Secondary | ICD-10-CM | POA: Diagnosis not present

## 2011-09-19 DIAGNOSIS — Z85828 Personal history of other malignant neoplasm of skin: Secondary | ICD-10-CM | POA: Diagnosis not present

## 2011-10-16 DIAGNOSIS — E119 Type 2 diabetes mellitus without complications: Secondary | ICD-10-CM | POA: Diagnosis not present

## 2011-10-16 DIAGNOSIS — E1149 Type 2 diabetes mellitus with other diabetic neurological complication: Secondary | ICD-10-CM | POA: Diagnosis not present

## 2011-11-07 DIAGNOSIS — I10 Essential (primary) hypertension: Secondary | ICD-10-CM | POA: Diagnosis not present

## 2011-11-07 DIAGNOSIS — E785 Hyperlipidemia, unspecified: Secondary | ICD-10-CM | POA: Diagnosis not present

## 2011-11-07 DIAGNOSIS — F411 Generalized anxiety disorder: Secondary | ICD-10-CM | POA: Diagnosis not present

## 2011-11-07 DIAGNOSIS — IMO0001 Reserved for inherently not codable concepts without codable children: Secondary | ICD-10-CM | POA: Diagnosis not present

## 2011-11-07 DIAGNOSIS — F329 Major depressive disorder, single episode, unspecified: Secondary | ICD-10-CM | POA: Diagnosis not present

## 2011-11-07 DIAGNOSIS — F3289 Other specified depressive episodes: Secondary | ICD-10-CM | POA: Diagnosis not present

## 2011-11-07 DIAGNOSIS — J449 Chronic obstructive pulmonary disease, unspecified: Secondary | ICD-10-CM | POA: Diagnosis not present

## 2011-12-13 DIAGNOSIS — N189 Chronic kidney disease, unspecified: Secondary | ICD-10-CM | POA: Diagnosis not present

## 2011-12-13 DIAGNOSIS — Z79899 Other long term (current) drug therapy: Secondary | ICD-10-CM | POA: Diagnosis not present

## 2011-12-18 DIAGNOSIS — L570 Actinic keratosis: Secondary | ICD-10-CM | POA: Diagnosis not present

## 2011-12-18 DIAGNOSIS — L905 Scar conditions and fibrosis of skin: Secondary | ICD-10-CM | POA: Diagnosis not present

## 2011-12-24 DIAGNOSIS — N183 Chronic kidney disease, stage 3 unspecified: Secondary | ICD-10-CM | POA: Diagnosis not present

## 2011-12-24 DIAGNOSIS — E1129 Type 2 diabetes mellitus with other diabetic kidney complication: Secondary | ICD-10-CM | POA: Diagnosis not present

## 2011-12-24 DIAGNOSIS — E875 Hyperkalemia: Secondary | ICD-10-CM | POA: Diagnosis not present

## 2011-12-24 DIAGNOSIS — I10 Essential (primary) hypertension: Secondary | ICD-10-CM | POA: Diagnosis not present

## 2012-03-09 ENCOUNTER — Encounter (HOSPITAL_COMMUNITY): Payer: Self-pay

## 2012-03-09 ENCOUNTER — Emergency Department (HOSPITAL_COMMUNITY)
Admission: EM | Admit: 2012-03-09 | Discharge: 2012-03-09 | Disposition: A | Discharge status: Home / Self Care | Payer: Medicare Other | Attending: Emergency Medicine | Admitting: Emergency Medicine

## 2012-03-09 DIAGNOSIS — M79609 Pain in unspecified limb: Secondary | ICD-10-CM | POA: Diagnosis not present

## 2012-03-09 DIAGNOSIS — Z79899 Other long term (current) drug therapy: Secondary | ICD-10-CM | POA: Diagnosis not present

## 2012-03-09 DIAGNOSIS — F3289 Other specified depressive episodes: Secondary | ICD-10-CM | POA: Insufficient documentation

## 2012-03-09 DIAGNOSIS — E7800 Pure hypercholesterolemia, unspecified: Secondary | ICD-10-CM | POA: Insufficient documentation

## 2012-03-09 DIAGNOSIS — J449 Chronic obstructive pulmonary disease, unspecified: Secondary | ICD-10-CM | POA: Insufficient documentation

## 2012-03-09 DIAGNOSIS — E119 Type 2 diabetes mellitus without complications: Secondary | ICD-10-CM | POA: Insufficient documentation

## 2012-03-09 DIAGNOSIS — K219 Gastro-esophageal reflux disease without esophagitis: Secondary | ICD-10-CM | POA: Insufficient documentation

## 2012-03-09 DIAGNOSIS — F411 Generalized anxiety disorder: Secondary | ICD-10-CM | POA: Insufficient documentation

## 2012-03-09 DIAGNOSIS — S51809A Unspecified open wound of unspecified forearm, initial encounter: Secondary | ICD-10-CM | POA: Insufficient documentation

## 2012-03-09 DIAGNOSIS — J4489 Other specified chronic obstructive pulmonary disease: Secondary | ICD-10-CM | POA: Insufficient documentation

## 2012-03-09 DIAGNOSIS — L988 Other specified disorders of the skin and subcutaneous tissue: Secondary | ICD-10-CM | POA: Insufficient documentation

## 2012-03-09 DIAGNOSIS — F329 Major depressive disorder, single episode, unspecified: Secondary | ICD-10-CM | POA: Diagnosis not present

## 2012-03-09 DIAGNOSIS — S41109A Unspecified open wound of unspecified upper arm, initial encounter: Secondary | ICD-10-CM | POA: Diagnosis not present

## 2012-03-09 DIAGNOSIS — IMO0002 Reserved for concepts with insufficient information to code with codable children: Secondary | ICD-10-CM | POA: Insufficient documentation

## 2012-03-09 HISTORY — DX: Pure hypercholesterolemia, unspecified: E78.00

## 2012-03-09 HISTORY — DX: Anxiety disorder, unspecified: F41.9

## 2012-03-09 HISTORY — DX: Panic disorder (episodic paroxysmal anxiety): F41.0

## 2012-03-09 HISTORY — DX: Gastro-esophageal reflux disease without esophagitis: K21.9

## 2012-03-09 MED ORDER — CEPHALEXIN 500 MG PO CAPS: 500.0000 mg | ORAL_CAPSULE | Freq: Three times a day (TID) | ORAL | Status: AC

## 2012-03-09 NOTE — ED Notes (Signed)
Pt was holding grandchild and bottom on back of childs jeans tore the skin on her right forearm, skin tear noted, sterile dressing applied in triage.

## 2012-03-09 NOTE — ED Notes (Signed)
Pt presents with large rt forearm skin tear. Bleeding controlled.Pt states was holding 78 year old child on her lap, child slid off her lap when the buttons on her pants tore pt's skin.

## 2012-03-09 NOTE — ED Provider Notes (Signed)
History     CSN: [id number]  Arrival date & time 03/09/12  1717   First MD Initiated Contact with Patient 03/09/12 1756      Chief Complaint  Patient presents with  . Wound Check    HPI Patient presents to the emergency room with complaints of a laceration on her right forearm. Patient was holding her grandchild when something for the skin on her right forearm.  She has very thin skin has had this happen to her before.Patient denies any numbness or weakness. She denies any other injuries. The pain is mild. Palpation exacerbates the discomfort Past Medical History  Diagnosis Date  . COPD (chronic obstructive pulmonary disease)   . Diabetes mellitus   . Depressed   . Anxiety   . Panic attacks   . Hypercholesteremia   . Acid reflux     Past Surgical History  Procedure Date  . Hip surgery     No family history on file.  History  Substance Use Topics  . Smoking status: Never Smoker   . Smokeless tobacco: Not on file  . Alcohol Use: No    OB History    Grav Para Term Preterm Abortions TAB SAB Ect Mult Living                  Review of Systems  Constitutional: Negative for fever.  Musculoskeletal: Negative for joint swelling.  Neurological: Negative for weakness and numbness.  Hematological: Bruises/bleeds easily.    Allergies  Hydromorphone hcl  Home Medications   Current Outpatient Rx  Name Route Sig Dispense Refill  . ALPRAZOLAM 0.5 MG PO TABS Oral Take 0.5 mg by mouth at bedtime as needed. Anxiety     . AMLODIPINE BESYLATE 5 MG PO TABS Oral Take 5 mg by mouth daily.      Marland Kitchen BRIMONIDINE TARTRATE 0.1 % OP SOLN Both Eyes Place 1 drop into both eyes 2 (two) times daily.      . CEPHALEXIN 500 MG PO CAPS Oral Take 1 capsule (500 mg total) by mouth 3 (three) times daily. 30 capsule 0  . DORZOLAMIDE HCL 2 % OP SOLN Both Eyes Place 1 drop into both eyes 2 (two) times daily.      . FUROSEMIDE 20 MG PO TABS Oral Take 20 mg by mouth daily.      Marland Kitchen GLIPIZIDE 5 MG PO  TABS Oral Take 5 mg by mouth daily.      . GLYBURIDE 5 MG PO TABS Oral Take 10 mg by mouth 2 (two) times daily.      . IPRATROPIUM-ALBUTEROL 0.5-2.5 (3) MG/3ML IN SOLN Nebulization Take 3 mLs by nebulization every 4 (four) hours as needed. Shortness of Breath    . LATANOPROST 0.005 % OP SOLN Both Eyes Place 1 drop into both eyes at bedtime.      Marland Kitchen LISINOPRIL 5 MG PO TABS Oral Take 5 mg by mouth daily.      Marland Kitchen OMEPRAZOLE 20 MG PO CPDR Oral Take 20 mg by mouth daily.      Marland Kitchen PRAVASTATIN SODIUM 40 MG PO TABS Oral Take 40 mg by mouth daily.      Marland Kitchen SIMVASTATIN 40 MG PO TABS Oral Take 40 mg by mouth at bedtime.        BP 142/82  Pulse 63  Temp 98.2 F (36.8 C) (Oral)  Resp 20  Ht 5\' 3"  (1.6 m)  Wt 179 lb (81.194 kg)  BMI 31.71 kg/m2  SpO2 99%  Physical Exam  Nursing note and vitals reviewed. Constitutional: She appears well-developed and well-nourished. No distress.  HENT:  Head: Normocephalic and atraumatic.  Right Ear: External ear normal.  Left Ear: External ear normal.  Eyes: Conjunctivae are normal. Right eye exhibits no discharge. Left eye exhibits no discharge. No scleral icterus.  Neck: Neck supple. No tracheal deviation present.  Cardiovascular: Normal rate.   Pulmonary/Chest: Effort normal. No stridor. No respiratory distress.  Musculoskeletal: She exhibits no edema.       Avulsion type superficial skin tear right forearm, approx 10 cm  Neurological: She is alert. Cranial nerve deficit: no gross deficits.  Skin: Skin is warm and dry. No rash noted.  Psychiatric: She has a normal mood and affect.    ED Course  LACERATION REPAIR Performed by: Kathalene Frames Authorized by: Dorie Rank R Consent: Verbal consent obtained. Body area: upper extremity Location details: right upper arm Laceration length: 10 cm Foreign bodies: no foreign bodies Tendon involvement: none Nerve involvement: none Vascular damage: no Irrigation solution: saline Amount of cleaning: standard Skin  closure: glue Approximation difficulty: complex (skin flaps unfolded and wound margins reapproximated)   (including critical care time)  Labs Reviewed - No data to display No results found.   1. Laceration       MDM  Discussed close observation.  Family requested abx because of her diabetes.   I explained that most studies have not shown any benefit to empiric antibiotics for skin lacerations.  Family was insistent.  I offered  an abx prescription for her to have and I recommended that she keep a close eye on the wound and if she noticed any redness or swelling to start the abx at that time.        Kathalene Frames, MD 03/09/12 [place]

## 2012-03-09 NOTE — ED Notes (Addendum)
EDP repaired skin tear with derm-a-bond. Pt tolerated well. Clean dressing applied. Pt and family instructed in wound care.

## 2012-03-09 NOTE — ED Notes (Signed)
Pt's wound irrigated with 50 ml's of NS. Pt tolerated well. Dr Hillard Danker notified of wound cleansing.  Derm-a-bond supplied to EDP

## 2012-03-20 DIAGNOSIS — IMO0002 Reserved for concepts with insufficient information to code with codable children: Secondary | ICD-10-CM | POA: Diagnosis not present

## 2012-03-20 DIAGNOSIS — IMO0001 Reserved for inherently not codable concepts without codable children: Secondary | ICD-10-CM | POA: Diagnosis not present

## 2012-04-09 DIAGNOSIS — E109 Type 1 diabetes mellitus without complications: Secondary | ICD-10-CM | POA: Diagnosis not present

## 2012-04-09 DIAGNOSIS — N19 Unspecified kidney failure: Secondary | ICD-10-CM | POA: Diagnosis not present

## 2012-04-09 DIAGNOSIS — J449 Chronic obstructive pulmonary disease, unspecified: Secondary | ICD-10-CM | POA: Diagnosis not present

## 2012-04-22 DIAGNOSIS — H2589 Other age-related cataract: Secondary | ICD-10-CM | POA: Diagnosis not present

## 2012-04-22 DIAGNOSIS — H4011X0 Primary open-angle glaucoma, stage unspecified: Secondary | ICD-10-CM | POA: Diagnosis not present

## 2012-04-22 DIAGNOSIS — H409 Unspecified glaucoma: Secondary | ICD-10-CM | POA: Diagnosis not present

## 2012-05-21 DIAGNOSIS — M1990 Unspecified osteoarthritis, unspecified site: Secondary | ICD-10-CM | POA: Diagnosis not present

## 2012-05-21 DIAGNOSIS — J019 Acute sinusitis, unspecified: Secondary | ICD-10-CM | POA: Diagnosis not present

## 2012-05-21 DIAGNOSIS — J4489 Other specified chronic obstructive pulmonary disease: Secondary | ICD-10-CM | POA: Diagnosis not present

## 2012-05-21 DIAGNOSIS — J449 Chronic obstructive pulmonary disease, unspecified: Secondary | ICD-10-CM | POA: Diagnosis not present

## 2012-05-21 DIAGNOSIS — E109 Type 1 diabetes mellitus without complications: Secondary | ICD-10-CM | POA: Diagnosis not present

## 2012-05-21 DIAGNOSIS — J209 Acute bronchitis, unspecified: Secondary | ICD-10-CM | POA: Diagnosis not present

## 2012-08-11 DIAGNOSIS — N189 Chronic kidney disease, unspecified: Secondary | ICD-10-CM | POA: Diagnosis not present

## 2012-08-11 DIAGNOSIS — Z79899 Other long term (current) drug therapy: Secondary | ICD-10-CM | POA: Diagnosis not present

## 2012-08-11 DIAGNOSIS — R809 Proteinuria, unspecified: Secondary | ICD-10-CM | POA: Diagnosis not present

## 2012-08-11 DIAGNOSIS — D649 Anemia, unspecified: Secondary | ICD-10-CM | POA: Diagnosis not present

## 2012-08-11 DIAGNOSIS — E559 Vitamin D deficiency, unspecified: Secondary | ICD-10-CM | POA: Diagnosis not present

## 2012-08-13 DIAGNOSIS — N19 Unspecified kidney failure: Secondary | ICD-10-CM | POA: Diagnosis not present

## 2012-08-13 DIAGNOSIS — E109 Type 1 diabetes mellitus without complications: Secondary | ICD-10-CM | POA: Diagnosis not present

## 2012-08-13 DIAGNOSIS — N183 Chronic kidney disease, stage 3 unspecified: Secondary | ICD-10-CM | POA: Diagnosis not present

## 2012-08-13 DIAGNOSIS — E785 Hyperlipidemia, unspecified: Secondary | ICD-10-CM | POA: Diagnosis not present

## 2012-08-13 DIAGNOSIS — M1990 Unspecified osteoarthritis, unspecified site: Secondary | ICD-10-CM | POA: Diagnosis not present

## 2012-08-13 DIAGNOSIS — E875 Hyperkalemia: Secondary | ICD-10-CM | POA: Diagnosis not present

## 2012-08-13 DIAGNOSIS — I10 Essential (primary) hypertension: Secondary | ICD-10-CM | POA: Diagnosis not present

## 2012-08-13 DIAGNOSIS — E559 Vitamin D deficiency, unspecified: Secondary | ICD-10-CM | POA: Diagnosis not present

## 2012-11-18 DIAGNOSIS — J449 Chronic obstructive pulmonary disease, unspecified: Secondary | ICD-10-CM | POA: Diagnosis not present

## 2012-11-18 DIAGNOSIS — E1169 Type 2 diabetes mellitus with other specified complication: Secondary | ICD-10-CM | POA: Diagnosis not present

## 2012-11-18 DIAGNOSIS — M545 Low back pain, unspecified: Secondary | ICD-10-CM | POA: Diagnosis not present

## 2012-11-18 DIAGNOSIS — J209 Acute bronchitis, unspecified: Secondary | ICD-10-CM | POA: Diagnosis not present

## 2012-11-24 DIAGNOSIS — N189 Chronic kidney disease, unspecified: Secondary | ICD-10-CM | POA: Diagnosis not present

## 2012-11-24 DIAGNOSIS — D649 Anemia, unspecified: Secondary | ICD-10-CM | POA: Diagnosis not present

## 2012-11-24 DIAGNOSIS — Z79899 Other long term (current) drug therapy: Secondary | ICD-10-CM | POA: Diagnosis not present

## 2012-11-24 DIAGNOSIS — I10 Essential (primary) hypertension: Secondary | ICD-10-CM | POA: Diagnosis not present

## 2012-11-24 DIAGNOSIS — E119 Type 2 diabetes mellitus without complications: Secondary | ICD-10-CM | POA: Diagnosis not present

## 2012-11-24 DIAGNOSIS — R809 Proteinuria, unspecified: Secondary | ICD-10-CM | POA: Diagnosis not present

## 2012-11-24 DIAGNOSIS — E559 Vitamin D deficiency, unspecified: Secondary | ICD-10-CM | POA: Diagnosis not present

## 2012-11-26 DIAGNOSIS — E872 Acidosis, unspecified: Secondary | ICD-10-CM | POA: Diagnosis not present

## 2012-11-26 DIAGNOSIS — E875 Hyperkalemia: Secondary | ICD-10-CM | POA: Diagnosis not present

## 2012-11-26 DIAGNOSIS — N183 Chronic kidney disease, stage 3 unspecified: Secondary | ICD-10-CM | POA: Diagnosis not present

## 2012-11-26 DIAGNOSIS — I10 Essential (primary) hypertension: Secondary | ICD-10-CM | POA: Diagnosis not present

## 2012-11-26 DIAGNOSIS — N2581 Secondary hyperparathyroidism of renal origin: Secondary | ICD-10-CM | POA: Diagnosis not present

## 2012-11-26 DIAGNOSIS — E559 Vitamin D deficiency, unspecified: Secondary | ICD-10-CM | POA: Diagnosis not present

## 2012-12-08 DIAGNOSIS — H409 Unspecified glaucoma: Secondary | ICD-10-CM | POA: Diagnosis not present

## 2012-12-08 DIAGNOSIS — H4011X0 Primary open-angle glaucoma, stage unspecified: Secondary | ICD-10-CM | POA: Diagnosis not present

## 2012-12-08 DIAGNOSIS — H2589 Other age-related cataract: Secondary | ICD-10-CM | POA: Diagnosis not present

## 2013-02-19 DIAGNOSIS — J4489 Other specified chronic obstructive pulmonary disease: Secondary | ICD-10-CM | POA: Diagnosis not present

## 2013-02-19 DIAGNOSIS — E109 Type 1 diabetes mellitus without complications: Secondary | ICD-10-CM | POA: Diagnosis not present

## 2013-02-19 DIAGNOSIS — N19 Unspecified kidney failure: Secondary | ICD-10-CM | POA: Diagnosis not present

## 2013-02-19 DIAGNOSIS — M1990 Unspecified osteoarthritis, unspecified site: Secondary | ICD-10-CM | POA: Diagnosis not present

## 2013-02-19 DIAGNOSIS — J449 Chronic obstructive pulmonary disease, unspecified: Secondary | ICD-10-CM | POA: Diagnosis not present

## 2013-04-06 DIAGNOSIS — N189 Chronic kidney disease, unspecified: Secondary | ICD-10-CM | POA: Diagnosis not present

## 2013-04-06 DIAGNOSIS — D649 Anemia, unspecified: Secondary | ICD-10-CM | POA: Diagnosis not present

## 2013-04-06 DIAGNOSIS — E559 Vitamin D deficiency, unspecified: Secondary | ICD-10-CM | POA: Diagnosis not present

## 2013-04-08 DIAGNOSIS — E559 Vitamin D deficiency, unspecified: Secondary | ICD-10-CM | POA: Diagnosis not present

## 2013-04-08 DIAGNOSIS — I10 Essential (primary) hypertension: Secondary | ICD-10-CM | POA: Diagnosis not present

## 2013-04-08 DIAGNOSIS — N2581 Secondary hyperparathyroidism of renal origin: Secondary | ICD-10-CM | POA: Diagnosis not present

## 2013-04-08 DIAGNOSIS — N183 Chronic kidney disease, stage 3 unspecified: Secondary | ICD-10-CM | POA: Diagnosis not present

## 2013-05-19 DIAGNOSIS — M545 Low back pain, unspecified: Secondary | ICD-10-CM | POA: Diagnosis not present

## 2013-05-19 DIAGNOSIS — J449 Chronic obstructive pulmonary disease, unspecified: Secondary | ICD-10-CM | POA: Diagnosis not present

## 2013-05-19 DIAGNOSIS — J209 Acute bronchitis, unspecified: Secondary | ICD-10-CM | POA: Diagnosis not present

## 2013-05-19 DIAGNOSIS — N19 Unspecified kidney failure: Secondary | ICD-10-CM | POA: Diagnosis not present

## 2013-05-19 DIAGNOSIS — Z23 Encounter for immunization: Secondary | ICD-10-CM | POA: Diagnosis not present

## 2013-05-19 DIAGNOSIS — E109 Type 1 diabetes mellitus without complications: Secondary | ICD-10-CM | POA: Diagnosis not present

## 2013-06-01 DIAGNOSIS — H409 Unspecified glaucoma: Secondary | ICD-10-CM | POA: Diagnosis not present

## 2013-06-01 DIAGNOSIS — H2589 Other age-related cataract: Secondary | ICD-10-CM | POA: Diagnosis not present

## 2013-06-01 DIAGNOSIS — H4011X0 Primary open-angle glaucoma, stage unspecified: Secondary | ICD-10-CM | POA: Diagnosis not present

## 2013-06-03 DIAGNOSIS — H401133 Primary open-angle glaucoma, bilateral, severe stage: Secondary | ICD-10-CM | POA: Insufficient documentation

## 2013-07-30 DIAGNOSIS — Z79899 Other long term (current) drug therapy: Secondary | ICD-10-CM | POA: Diagnosis not present

## 2013-07-30 DIAGNOSIS — R809 Proteinuria, unspecified: Secondary | ICD-10-CM | POA: Diagnosis not present

## 2013-07-30 DIAGNOSIS — D649 Anemia, unspecified: Secondary | ICD-10-CM | POA: Diagnosis not present

## 2013-07-30 DIAGNOSIS — N189 Chronic kidney disease, unspecified: Secondary | ICD-10-CM | POA: Diagnosis not present

## 2013-07-30 DIAGNOSIS — E559 Vitamin D deficiency, unspecified: Secondary | ICD-10-CM | POA: Diagnosis not present

## 2013-08-05 DIAGNOSIS — N179 Acute kidney failure, unspecified: Secondary | ICD-10-CM | POA: Diagnosis not present

## 2013-08-05 DIAGNOSIS — I10 Essential (primary) hypertension: Secondary | ICD-10-CM | POA: Diagnosis not present

## 2013-08-05 DIAGNOSIS — E559 Vitamin D deficiency, unspecified: Secondary | ICD-10-CM | POA: Diagnosis not present

## 2013-08-18 DIAGNOSIS — I119 Hypertensive heart disease without heart failure: Secondary | ICD-10-CM | POA: Diagnosis not present

## 2013-08-18 DIAGNOSIS — J40 Bronchitis, not specified as acute or chronic: Secondary | ICD-10-CM | POA: Diagnosis not present

## 2013-08-18 DIAGNOSIS — E109 Type 1 diabetes mellitus without complications: Secondary | ICD-10-CM | POA: Diagnosis not present

## 2013-08-18 DIAGNOSIS — J449 Chronic obstructive pulmonary disease, unspecified: Secondary | ICD-10-CM | POA: Diagnosis not present

## 2013-10-05 DIAGNOSIS — N189 Chronic kidney disease, unspecified: Secondary | ICD-10-CM | POA: Diagnosis not present

## 2013-10-05 DIAGNOSIS — Z79899 Other long term (current) drug therapy: Secondary | ICD-10-CM | POA: Diagnosis not present

## 2013-10-07 DIAGNOSIS — E1129 Type 2 diabetes mellitus with other diabetic kidney complication: Secondary | ICD-10-CM | POA: Diagnosis not present

## 2013-10-07 DIAGNOSIS — I10 Essential (primary) hypertension: Secondary | ICD-10-CM | POA: Diagnosis not present

## 2013-10-07 DIAGNOSIS — N183 Chronic kidney disease, stage 3 unspecified: Secondary | ICD-10-CM | POA: Diagnosis not present

## 2013-12-14 DIAGNOSIS — H4011X0 Primary open-angle glaucoma, stage unspecified: Secondary | ICD-10-CM | POA: Diagnosis not present

## 2013-12-14 DIAGNOSIS — H409 Unspecified glaucoma: Secondary | ICD-10-CM | POA: Diagnosis not present

## 2013-12-14 DIAGNOSIS — H2589 Other age-related cataract: Secondary | ICD-10-CM | POA: Diagnosis not present

## 2013-12-15 DIAGNOSIS — E109 Type 1 diabetes mellitus without complications: Secondary | ICD-10-CM | POA: Diagnosis not present

## 2013-12-15 DIAGNOSIS — N19 Unspecified kidney failure: Secondary | ICD-10-CM | POA: Diagnosis not present

## 2013-12-15 DIAGNOSIS — F329 Major depressive disorder, single episode, unspecified: Secondary | ICD-10-CM | POA: Diagnosis not present

## 2013-12-15 DIAGNOSIS — I10 Essential (primary) hypertension: Secondary | ICD-10-CM | POA: Diagnosis not present

## 2013-12-15 DIAGNOSIS — F3289 Other specified depressive episodes: Secondary | ICD-10-CM | POA: Diagnosis not present

## 2013-12-21 DIAGNOSIS — Z79899 Other long term (current) drug therapy: Secondary | ICD-10-CM | POA: Diagnosis not present

## 2013-12-21 DIAGNOSIS — E559 Vitamin D deficiency, unspecified: Secondary | ICD-10-CM | POA: Diagnosis not present

## 2013-12-21 DIAGNOSIS — I10 Essential (primary) hypertension: Secondary | ICD-10-CM | POA: Diagnosis not present

## 2013-12-21 DIAGNOSIS — D649 Anemia, unspecified: Secondary | ICD-10-CM | POA: Diagnosis not present

## 2013-12-21 DIAGNOSIS — N189 Chronic kidney disease, unspecified: Secondary | ICD-10-CM | POA: Diagnosis not present

## 2013-12-21 DIAGNOSIS — R809 Proteinuria, unspecified: Secondary | ICD-10-CM | POA: Diagnosis not present

## 2013-12-23 DIAGNOSIS — N183 Chronic kidney disease, stage 3 unspecified: Secondary | ICD-10-CM | POA: Diagnosis not present

## 2013-12-23 DIAGNOSIS — R809 Proteinuria, unspecified: Secondary | ICD-10-CM | POA: Diagnosis not present

## 2013-12-23 DIAGNOSIS — I10 Essential (primary) hypertension: Secondary | ICD-10-CM | POA: Diagnosis not present

## 2013-12-23 DIAGNOSIS — E559 Vitamin D deficiency, unspecified: Secondary | ICD-10-CM | POA: Diagnosis not present

## 2014-01-04 DIAGNOSIS — H4011X0 Primary open-angle glaucoma, stage unspecified: Secondary | ICD-10-CM | POA: Diagnosis not present

## 2014-01-04 DIAGNOSIS — H409 Unspecified glaucoma: Secondary | ICD-10-CM | POA: Diagnosis not present

## 2014-01-04 DIAGNOSIS — H2589 Other age-related cataract: Secondary | ICD-10-CM | POA: Diagnosis not present

## 2014-03-16 DIAGNOSIS — F411 Generalized anxiety disorder: Secondary | ICD-10-CM | POA: Diagnosis not present

## 2014-03-16 DIAGNOSIS — E118 Type 2 diabetes mellitus with unspecified complications: Secondary | ICD-10-CM | POA: Diagnosis not present

## 2014-03-16 DIAGNOSIS — J449 Chronic obstructive pulmonary disease, unspecified: Secondary | ICD-10-CM | POA: Diagnosis not present

## 2014-03-16 DIAGNOSIS — I129 Hypertensive chronic kidney disease with stage 1 through stage 4 chronic kidney disease, or unspecified chronic kidney disease: Secondary | ICD-10-CM | POA: Diagnosis not present

## 2014-03-19 DIAGNOSIS — E118 Type 2 diabetes mellitus with unspecified complications: Secondary | ICD-10-CM | POA: Diagnosis not present

## 2014-03-19 DIAGNOSIS — F411 Generalized anxiety disorder: Secondary | ICD-10-CM | POA: Diagnosis not present

## 2014-03-19 DIAGNOSIS — M1990 Unspecified osteoarthritis, unspecified site: Secondary | ICD-10-CM | POA: Diagnosis not present

## 2014-03-19 DIAGNOSIS — J4489 Other specified chronic obstructive pulmonary disease: Secondary | ICD-10-CM | POA: Diagnosis not present

## 2014-03-19 DIAGNOSIS — J449 Chronic obstructive pulmonary disease, unspecified: Secondary | ICD-10-CM | POA: Diagnosis not present

## 2014-03-19 DIAGNOSIS — I129 Hypertensive chronic kidney disease with stage 1 through stage 4 chronic kidney disease, or unspecified chronic kidney disease: Secondary | ICD-10-CM | POA: Diagnosis not present

## 2014-04-12 DIAGNOSIS — H4011X0 Primary open-angle glaucoma, stage unspecified: Secondary | ICD-10-CM | POA: Diagnosis not present

## 2014-04-12 DIAGNOSIS — H2589 Other age-related cataract: Secondary | ICD-10-CM | POA: Diagnosis not present

## 2014-04-12 DIAGNOSIS — I10 Essential (primary) hypertension: Secondary | ICD-10-CM | POA: Diagnosis not present

## 2014-04-12 DIAGNOSIS — R809 Proteinuria, unspecified: Secondary | ICD-10-CM | POA: Diagnosis not present

## 2014-04-12 DIAGNOSIS — N189 Chronic kidney disease, unspecified: Secondary | ICD-10-CM | POA: Diagnosis not present

## 2014-04-12 DIAGNOSIS — Z79899 Other long term (current) drug therapy: Secondary | ICD-10-CM | POA: Diagnosis not present

## 2014-04-12 DIAGNOSIS — H409 Unspecified glaucoma: Secondary | ICD-10-CM | POA: Diagnosis not present

## 2014-04-12 DIAGNOSIS — D649 Anemia, unspecified: Secondary | ICD-10-CM | POA: Diagnosis not present

## 2014-04-12 DIAGNOSIS — E559 Vitamin D deficiency, unspecified: Secondary | ICD-10-CM | POA: Diagnosis not present

## 2014-04-14 DIAGNOSIS — R809 Proteinuria, unspecified: Secondary | ICD-10-CM | POA: Diagnosis not present

## 2014-04-14 DIAGNOSIS — I10 Essential (primary) hypertension: Secondary | ICD-10-CM | POA: Diagnosis not present

## 2014-04-14 DIAGNOSIS — N183 Chronic kidney disease, stage 3 unspecified: Secondary | ICD-10-CM | POA: Diagnosis not present

## 2015-04-05 ENCOUNTER — Emergency Department (HOSPITAL_COMMUNITY): Payer: Medicare Other

## 2015-04-05 ENCOUNTER — Emergency Department (HOSPITAL_COMMUNITY)
Admission: EM | Admit: 2015-04-05 | Discharge: 2015-04-06 | Disposition: A | Discharge status: Home / Self Care | Payer: Medicare Other | Attending: Emergency Medicine | Admitting: Emergency Medicine

## 2015-04-05 ENCOUNTER — Encounter (HOSPITAL_COMMUNITY): Payer: Self-pay | Admitting: *Deleted

## 2015-04-05 DIAGNOSIS — F410 Panic disorder [episodic paroxysmal anxiety] without agoraphobia: Secondary | ICD-10-CM | POA: Insufficient documentation

## 2015-04-05 DIAGNOSIS — B029 Zoster without complications: Secondary | ICD-10-CM | POA: Diagnosis not present

## 2015-04-05 DIAGNOSIS — Z79899 Other long term (current) drug therapy: Secondary | ICD-10-CM | POA: Diagnosis not present

## 2015-04-05 DIAGNOSIS — N289 Disorder of kidney and ureter, unspecified: Secondary | ICD-10-CM | POA: Diagnosis not present

## 2015-04-05 DIAGNOSIS — Z471 Aftercare following joint replacement surgery: Secondary | ICD-10-CM | POA: Diagnosis not present

## 2015-04-05 DIAGNOSIS — K219 Gastro-esophageal reflux disease without esophagitis: Secondary | ICD-10-CM | POA: Insufficient documentation

## 2015-04-05 DIAGNOSIS — N390 Urinary tract infection, site not specified: Secondary | ICD-10-CM | POA: Insufficient documentation

## 2015-04-05 DIAGNOSIS — M79671 Pain in right foot: Secondary | ICD-10-CM | POA: Insufficient documentation

## 2015-04-05 DIAGNOSIS — H5441 Blindness, right eye, normal vision left eye: Secondary | ICD-10-CM | POA: Insufficient documentation

## 2015-04-05 DIAGNOSIS — M2011 Hallux valgus (acquired), right foot: Secondary | ICD-10-CM | POA: Diagnosis not present

## 2015-04-05 DIAGNOSIS — F329 Major depressive disorder, single episode, unspecified: Secondary | ICD-10-CM | POA: Insufficient documentation

## 2015-04-05 DIAGNOSIS — J449 Chronic obstructive pulmonary disease, unspecified: Secondary | ICD-10-CM | POA: Diagnosis not present

## 2015-04-05 DIAGNOSIS — E119 Type 2 diabetes mellitus without complications: Secondary | ICD-10-CM | POA: Diagnosis not present

## 2015-04-05 DIAGNOSIS — Z96641 Presence of right artificial hip joint: Secondary | ICD-10-CM | POA: Diagnosis not present

## 2015-04-05 DIAGNOSIS — E780 Pure hypercholesterolemia: Secondary | ICD-10-CM | POA: Insufficient documentation

## 2015-04-05 DIAGNOSIS — M7731 Calcaneal spur, right foot: Secondary | ICD-10-CM | POA: Diagnosis not present

## 2015-04-05 DIAGNOSIS — M25551 Pain in right hip: Secondary | ICD-10-CM | POA: Diagnosis present

## 2015-04-05 HISTORY — DX: Legal blindness, as defined in USA: H54.8

## 2015-04-05 LAB — URINALYSIS, ROUTINE W REFLEX MICROSCOPIC
Bilirubin Urine: NEGATIVE
Glucose, UA: NEGATIVE mg/dL
Nitrite: POSITIVE — AB
Urobilinogen, UA: 0.2 mg/dL (ref 0.0–1.0)
pH: 5.5 (ref 5.0–8.0)

## 2015-04-05 LAB — CBC WITH DIFFERENTIAL/PLATELET
BASOS PCT: 0 % (ref 0–1)
Basophils Absolute: 0 10*3/uL (ref 0.0–0.1)
EOS PCT: 1 % (ref 0–5)
Eosinophils Absolute: 0 10*3/uL (ref 0.0–0.7)
HEMATOCRIT: 40 % (ref 36.0–46.0)
Hemoglobin: 13.1 g/dL (ref 12.0–15.0)
LYMPHS ABS: 1.3 10*3/uL (ref 0.7–4.0)
LYMPHS PCT: 24 % (ref 12–46)
MCH: 30 pg (ref 26.0–34.0)
MCHC: 32.8 g/dL (ref 30.0–36.0)
MCV: 91.5 fL (ref 78.0–100.0)
MONO ABS: 0.6 10*3/uL (ref 0.1–1.0)
MONOS PCT: 11 % (ref 3–12)
NEUTROS ABS: 3.5 10*3/uL (ref 1.7–7.7)
Neutrophils Relative %: 64 % (ref 43–77)
Platelets: 190 10*3/uL (ref 150–400)
RBC: 4.37 MIL/uL (ref 3.87–5.11)
RDW: 14.1 % (ref 11.5–15.5)
WBC: 5.5 10*3/uL (ref 4.0–10.5)

## 2015-04-05 LAB — URINE MICROSCOPIC-ADD ON

## 2015-04-05 LAB — BASIC METABOLIC PANEL
ANION GAP: 12 (ref 5–15)
BUN: 37 mg/dL — AB (ref 6–20)
CALCIUM: 8.6 mg/dL — AB (ref 8.9–10.3)
CO2: 21 mmol/L — AB (ref 22–32)
Chloride: 101 mmol/L (ref 101–111)
Creatinine, Ser: 1.83 mg/dL — ABNORMAL HIGH (ref 0.44–1.00)
GFR calc Af Amer: 28 mL/min — ABNORMAL LOW (ref >60)
GFR calc non Af Amer: 24 mL/min — ABNORMAL LOW (ref >60)
Glucose, Bld: 173 mg/dL — ABNORMAL HIGH (ref 65–99)
Potassium: 4 mmol/L (ref 3.5–5.1)
SODIUM: 134 mmol/L — AB (ref 135–145)

## 2015-04-05 MED ORDER — CEPHALEXIN 500 MG PO CAPS
500.0000 mg | ORAL_CAPSULE | Freq: Four times a day (QID) | ORAL | Status: DC
  Administered 2015-04-06: 500 mg via ORAL
  Filled 2015-04-05: qty 1

## 2015-04-05 MED ORDER — CEPHALEXIN 500 MG PO CAPS: 500.0000 mg | ORAL_CAPSULE | Freq: Four times a day (QID) | ORAL | Status: DC

## 2015-04-05 MED ORDER — TRAMADOL HCL 50 MG PO TABS
50.0000 mg | ORAL_TABLET | Freq: Four times a day (QID) | ORAL | Status: DC | PRN
  Administered 2015-04-06: 50 mg via ORAL
  Filled 2015-04-05: qty 1

## 2015-04-05 MED ORDER — TRAMADOL HCL 50 MG PO TABS: 50.0000 mg | ORAL_TABLET | Freq: Four times a day (QID) | ORAL | Status: DC | PRN

## 2015-04-05 NOTE — Discharge Instructions (Signed)

## 2015-04-05 NOTE — ED Notes (Signed)
Patient states that when she stands on her right leg it feels like a rubber band is tightening around her foot and a shooting pain up her entire leg.

## 2015-04-05 NOTE — ED Provider Notes (Signed)
CSN: [id number]     Arrival date & time 04/05/15  2017 History  This chart was scribed for Dorie Rank, MD by Helane Gunther, ED Scribe. This patient was seen in room APA05/APA05 and the patient's care was started at 9:37 PM.    Chief Complaint  Patient presents with  . Hip Pain   Patient is a 81 y.o. female presenting with hip pain. The history is provided by the patient and a relative. No language interpreter was used.  Hip Pain This is a new problem. The current episode started more than 1 week ago. The problem occurs constantly. The problem has been gradually worsening. The symptoms are aggravated by walking. Nothing relieves the symptoms. She has tried nothing for the symptoms.   HPI Comments: [name] is a 81 y.o. female with a PMHx of DM who presents to the Emergency Department complaining of constant, aching, worsening right hip pain onset 2 weeks ago. She states the pain has been progressively worsening since 3 days ago. She reports associated right foot pain which feels like a rubber band is tightly drawn across it. She notes exacerbation by walking. She has a PSHx of hip replacement. She states she has recently had trouble making it all the way to the bathroom, and has had a facility placed near her bed. Pt denies fever, falls, and recent injuries. She denies difficulty urinating and dysuria.  Per her daughter, she has not been to the doctor or had her medications for the past several months. Her daughter states pt has been a little confused and is concerned she may be dehydrated. She had been on oxycodone and pain patches for pain management, but has not been taking them recently.  Past Medical History  Diagnosis Date  . COPD (chronic obstructive pulmonary disease)   . Diabetes mellitus   . Depressed   . Anxiety   . Panic attacks   . Hypercholesteremia   . Acid reflux   . Legally blind in right eye, as defined in [place]    Past Surgical History  Procedure Laterality Date  .  Hip surgery     History reviewed. No pertinent family history. History  Substance Use Topics  . Smoking status: Never Smoker   . Smokeless tobacco: Not on file  . Alcohol Use: No   OB History    No data available     Review of Systems  Constitutional: Negative for fever.  Genitourinary: Negative for dysuria and difficulty urinating.  Musculoskeletal: Positive for arthralgias.  All other systems reviewed and are negative.   Allergies  Hydromorphone hcl  Home Medications   Prior to Admission medications   Medication Sig Start Date End Date Taking? Authorizing Provider  acetaminophen (TYLENOL) 500 MG tablet Take 500 mg by mouth every 6 (six) hours as needed for mild pain or moderate pain.   Yes Historical Provider, MD  ALPRAZolam Duanne Moron) 0.5 MG tablet Take 0.5 mg by mouth at bedtime as needed. Anxiety     Historical Provider, MD  amLODipine (NORVASC) 5 MG tablet Take 5 mg by mouth daily.      Historical Provider, MD  brimonidine (ALPHAGAN P) 0.1 % SOLN Place 1 drop into both eyes 2 (two) times daily.      Historical Provider, MD  cephALEXin (KEFLEX) 500 MG capsule Take 1 capsule (500 mg total) by mouth every 6 (six) hours. 04/05/15   Dorie Rank, MD  dorzolamide (TRUSOPT) 2 % ophthalmic solution Place 1 drop into both eyes  2 (two) times daily.      Historical Provider, MD  glipiZIDE (GLUCOTROL) 5 MG tablet Take 5 mg by mouth daily.      Historical Provider, MD  ipratropium-albuterol (DUONEB) 0.5-2.5 (3) MG/3ML SOLN Take 3 mLs by nebulization every 4 (four) hours as needed. Shortness of Breath    Historical Provider, MD  latanoprost (XALATAN) 0.005 % ophthalmic solution Place 1 drop into both eyes at bedtime.      Historical Provider, MD  omeprazole (PRILOSEC) 20 MG capsule Take 20 mg by mouth daily.      Historical Provider, MD  pravastatin (PRAVACHOL) 40 MG tablet Take 40 mg by mouth daily.      Historical Provider, MD  traMADol (ULTRAM) 50 MG tablet Take 1 tablet (50 mg total) by  mouth every 6 (six) hours as needed for moderate pain. 04/05/15   Dorie Rank, MD   BP 155/92 mmHg  Pulse 62  Temp(Src) 98.9 F (37.2 C) (Oral)  Resp 22  Ht 5\' 3"  (1.6 m)  Wt 191 lb 9.6 oz (86.909 kg)  BMI 33.95 kg/m2  SpO2 98% Physical Exam  Constitutional: No distress.  elderly  HENT:  Head: Normocephalic and atraumatic.  Right Ear: External ear normal.  Left Ear: External ear normal.  Eyes: Conjunctivae are normal. Right eye exhibits no discharge. Left eye exhibits no discharge. No scleral icterus.  Neck: Neck supple. No tracheal deviation present.  Cardiovascular: Normal rate, regular rhythm and intact distal pulses.   Pulmonary/Chest: Effort normal and breath sounds normal. No stridor. No respiratory distress. She has no wheezes. She has no rales.  Abdominal: Soft. Bowel sounds are normal. She exhibits no distension. There is no tenderness. There is no rebound and no guarding.  Musculoskeletal: She exhibits tenderness. She exhibits no edema.  Mild pain with ROM R hip, mild ttp R foot. No swelling or erythema, normal pulses  Neurological: She is alert. She has normal strength. No cranial nerve deficit (no facial droop, extraocular movements intact, no slurred speech) or sensory deficit. She exhibits normal muscle tone. She displays no seizure activity. Coordination normal.  Skin: Skin is warm and dry. No rash noted.  Psychiatric: She has a normal mood and affect.  Nursing note and vitals reviewed.   ED Course  Procedures  DIAGNOSTIC STUDIES: Oxygen Saturation is 97% on RA, adequate by my interpretation.    COORDINATION OF CARE: 9:44 PM - Discussed plans to order diagnostic studies. Pt advised of plan for treatment and pt agrees.  Labs Review Labs Reviewed  BASIC METABOLIC PANEL - Abnormal; Notable for the following:    Sodium 134 (*)    CO2 21 (*)    Glucose, Bld 173 (*)    BUN 37 (*)    Creatinine, Ser 1.83 (*)    Calcium 8.6 (*)    GFR calc non Af Amer 24 (*)    GFR  calc Af Amer 28 (*)    All other components within normal limits  URINALYSIS, ROUTINE W REFLEX MICROSCOPIC (NOT AT [facility]) - Abnormal; Notable for the following:    APPearance HAZY (*)    Specific Gravity, Urine >1.030 (*)    Hgb urine dipstick TRACE (*)    Ketones, ur TRACE (*)    Protein, ur TRACE (*)    Nitrite POSITIVE (*)    Leukocytes, UA MODERATE (*)    All other components within normal limits  URINE MICROSCOPIC-ADD ON - Abnormal; Notable for the following:    Squamous Epithelial /  LPF FEW (*)    Bacteria, UA MANY (*)    All other components within normal limits  URINE CULTURE  CBC WITH DIFFERENTIAL/PLATELET    Imaging Review Dg Foot Complete Right  04/05/2015   CLINICAL DATA:  Right leg pain  EXAM: RIGHT FOOT COMPLETE - 3+ VIEW  COMPARISON:  None.  FINDINGS: Three views of the right foot submitted. No acute fracture or subluxation. There is diffuse osteopenia. Plantar spur of calcaneus. Mild hallux valgus deformity.  IMPRESSION: No acute fracture or subluxation. Diffuse osteopenia. Plantar spur of calcaneus. Mild hallux valgus deformity.   Electronically Signed   By: Lahoma Crocker M.D.   On: 04/05/2015 22:12   Dg Hip Unilat With Pelvis 2-3 Views Right  04/05/2015   CLINICAL DATA:  Hip pain  EXAM: DG HIP (WITH OR WITHOUT PELVIS) 2-3V RIGHT  COMPARISON:  Right hip radiographs July 01, 2011  FINDINGS: Frontal pelvis as well as frontal and lateral right hip images were obtained. There is a total hip prosthesis on the right with prosthetic components appearing well-seated. No acute fracture or dislocation. There is mild narrowing of the left hip joint. There is a small area of heterotopic calcification superior to the right greater trochanter.  IMPRESSION: Right total hip replacement with prosthetic components appearing well-seated. No acute fracture or dislocation. Narrowing left hip joint.   Electronically Signed   By: Lowella Grip III M.D.   On: 04/05/2015 21:48      MDM    Final diagnoses:  UTI (lower urinary tract infection)  Renal insufficiency   Patient's x-rays do not show any acute abnormalities. According to the family, the patient has had some chronic pain issues. She used to be on a pain patch as well as other medications. I doubt any acute infection or vascular compromise.  Her laboratory tests do show worsening renal insufficiency however this is compared to 4 years ago. Patient is not having any vomiting or diarrhea. She does not appear to be acutely dehydrated. Patient has had some urinary symptoms. Urinalysis is consistent with urinary tract infection. I will give her a dose of Keflex and start her on a prescription. I will have her follow-up with her primary doctor.  At this time there does not appear to be any evidence of an acute emergency medical condition and the patient appears stable for discharge with appropriate outpatient follow up.  I personally performed the services described in this documentation, which was scribed in my presence.  The recorded information has been reviewed and is accurate.    Dorie Rank, MD 04/05/15 [phone]

## 2015-04-05 NOTE — ED Notes (Signed)
MD at bedside. 

## 2015-04-05 NOTE — ED Notes (Signed)
Pt c/o right hip and leg pain. Daughter states she thought her brother was taking care of her mom and giving her her meds and getting her to her dr's appts. Daughter states she has noticed that her mother's mind has been "slipping" a little but thought it was part of her getting older. Daughter states that she found out to day that her mother hasn't had any of her meds in over 6 months.

## 2015-04-06 DIAGNOSIS — E782 Mixed hyperlipidemia: Secondary | ICD-10-CM | POA: Diagnosis not present

## 2015-04-06 DIAGNOSIS — I129 Hypertensive chronic kidney disease with stage 1 through stage 4 chronic kidney disease, or unspecified chronic kidney disease: Secondary | ICD-10-CM | POA: Diagnosis not present

## 2015-04-06 DIAGNOSIS — B029 Zoster without complications: Secondary | ICD-10-CM | POA: Diagnosis not present

## 2015-04-06 DIAGNOSIS — J449 Chronic obstructive pulmonary disease, unspecified: Secondary | ICD-10-CM | POA: Diagnosis not present

## 2015-04-06 DIAGNOSIS — E118 Type 2 diabetes mellitus with unspecified complications: Secondary | ICD-10-CM | POA: Diagnosis not present

## 2015-04-06 DIAGNOSIS — E1165 Type 2 diabetes mellitus with hyperglycemia: Secondary | ICD-10-CM | POA: Diagnosis not present

## 2015-04-06 DIAGNOSIS — N390 Urinary tract infection, site not specified: Secondary | ICD-10-CM | POA: Diagnosis not present

## 2015-04-06 MED ORDER — OXYCODONE-ACETAMINOPHEN 5-325 MG PO TABS: 1.0000 | ORAL_TABLET | Freq: Four times a day (QID) | ORAL | Status: DC | PRN

## 2015-04-06 MED ORDER — PREDNISONE 10 MG PO TABS: ORAL_TABLET | ORAL | Status: DC

## 2015-04-06 MED ORDER — ACYCLOVIR 400 MG PO TABS: 800.0000 mg | ORAL_TABLET | Freq: Every day | ORAL | Status: DC

## 2015-04-06 NOTE — ED Provider Notes (Signed)
Nurse asked me to see patient, as she was being discharged she noticed a rash on her left calf suspicious for herpes zoster. Patient states she has had pain and a rash for about a week. She has not had the shingles vaccine. She does not recall she had chickenpox as a child.  Patient has one lesion on her right posterior calf proximally 0.5 x 2 cm in length with a red base and a cluster of vesicles in the center consistent with shingles. She complains of a lot of pain in her foot although I do not see any active lesions on her foot.  Patient was started on acyclovir, Percocet, and prednisone. Patient has an appointment tomorrow with her PCP. I discussed with her daughter to let Dr. Luan Pulling no about the rash. Since she is late getting treatment she was also advised she may have some persistent lingering pain. She has tramadol written I discussed with her daughter to try the tramadol however if it's not controlling her pain she can try the Percocet. Daughter states that she overlooks the patient's medications.   Diagnoses that have been ruled out:  None  Diagnoses that are still under consideration:  None  Final diagnoses:  UTI (lower urinary tract infection)  Renal insufficiency  Herpes zoster   Discharge Medication List as of 04/05/2015 11:43 PM    START taking these medications   Details  cephALEXin (KEFLEX) 500 MG capsule Take 1 capsule (500 mg total) by mouth every 6 (six) hours., Starting 04/05/2015, Until Discontinued, Print    traMADol (ULTRAM) 50 MG tablet Take 1 tablet (50 mg total) by mouth every 6 (six) hours as needed for moderate pain., Starting 04/05/2015, Until Discontinued, Print        Plan discharge  Rolland Porter, MD, Barbette Or, MD 04/06/15 [id number]

## 2015-04-08 LAB — URINE CULTURE: Culture: >=100000

## 2015-05-03 DIAGNOSIS — J449 Chronic obstructive pulmonary disease, unspecified: Secondary | ICD-10-CM | POA: Diagnosis not present

## 2015-05-03 DIAGNOSIS — Z23 Encounter for immunization: Secondary | ICD-10-CM | POA: Diagnosis not present

## 2015-05-03 DIAGNOSIS — B029 Zoster without complications: Secondary | ICD-10-CM | POA: Diagnosis not present

## 2015-05-03 DIAGNOSIS — E119 Type 2 diabetes mellitus without complications: Secondary | ICD-10-CM | POA: Diagnosis not present

## 2015-05-03 DIAGNOSIS — I129 Hypertensive chronic kidney disease with stage 1 through stage 4 chronic kidney disease, or unspecified chronic kidney disease: Secondary | ICD-10-CM | POA: Diagnosis not present

## 2015-05-03 DIAGNOSIS — N390 Urinary tract infection, site not specified: Secondary | ICD-10-CM | POA: Diagnosis not present

## 2015-05-13 DIAGNOSIS — Z23 Encounter for immunization: Secondary | ICD-10-CM | POA: Diagnosis not present

## 2015-05-31 DIAGNOSIS — D649 Anemia, unspecified: Secondary | ICD-10-CM | POA: Diagnosis not present

## 2015-05-31 DIAGNOSIS — I10 Essential (primary) hypertension: Secondary | ICD-10-CM | POA: Diagnosis not present

## 2015-05-31 DIAGNOSIS — N183 Chronic kidney disease, stage 3 (moderate): Secondary | ICD-10-CM | POA: Diagnosis not present

## 2015-05-31 DIAGNOSIS — E559 Vitamin D deficiency, unspecified: Secondary | ICD-10-CM | POA: Diagnosis not present

## 2015-05-31 DIAGNOSIS — D509 Iron deficiency anemia, unspecified: Secondary | ICD-10-CM | POA: Diagnosis not present

## 2015-05-31 DIAGNOSIS — R809 Proteinuria, unspecified: Secondary | ICD-10-CM | POA: Diagnosis not present

## 2015-05-31 DIAGNOSIS — Z79899 Other long term (current) drug therapy: Secondary | ICD-10-CM | POA: Diagnosis not present

## 2015-06-07 DIAGNOSIS — N2581 Secondary hyperparathyroidism of renal origin: Secondary | ICD-10-CM | POA: Diagnosis not present

## 2015-06-07 DIAGNOSIS — N184 Chronic kidney disease, stage 4 (severe): Secondary | ICD-10-CM | POA: Diagnosis not present

## 2015-06-07 DIAGNOSIS — I10 Essential (primary) hypertension: Secondary | ICD-10-CM | POA: Diagnosis not present

## 2015-07-05 DIAGNOSIS — E782 Mixed hyperlipidemia: Secondary | ICD-10-CM | POA: Diagnosis not present

## 2015-07-05 DIAGNOSIS — E1121 Type 2 diabetes mellitus with diabetic nephropathy: Secondary | ICD-10-CM | POA: Diagnosis not present

## 2015-07-05 DIAGNOSIS — J449 Chronic obstructive pulmonary disease, unspecified: Secondary | ICD-10-CM | POA: Diagnosis not present

## 2015-07-05 DIAGNOSIS — I129 Hypertensive chronic kidney disease with stage 1 through stage 4 chronic kidney disease, or unspecified chronic kidney disease: Secondary | ICD-10-CM | POA: Diagnosis not present

## 2015-07-13 DIAGNOSIS — J449 Chronic obstructive pulmonary disease, unspecified: Secondary | ICD-10-CM | POA: Diagnosis not present

## 2015-07-13 DIAGNOSIS — K210 Gastro-esophageal reflux disease with esophagitis: Secondary | ICD-10-CM | POA: Diagnosis not present

## 2015-07-13 DIAGNOSIS — E1121 Type 2 diabetes mellitus with diabetic nephropathy: Secondary | ICD-10-CM | POA: Diagnosis not present

## 2015-07-13 DIAGNOSIS — I129 Hypertensive chronic kidney disease with stage 1 through stage 4 chronic kidney disease, or unspecified chronic kidney disease: Secondary | ICD-10-CM | POA: Diagnosis not present

## 2015-07-13 DIAGNOSIS — E782 Mixed hyperlipidemia: Secondary | ICD-10-CM | POA: Diagnosis not present

## 2015-07-18 DIAGNOSIS — E1121 Type 2 diabetes mellitus with diabetic nephropathy: Secondary | ICD-10-CM | POA: Diagnosis not present

## 2015-07-18 DIAGNOSIS — J449 Chronic obstructive pulmonary disease, unspecified: Secondary | ICD-10-CM | POA: Diagnosis not present

## 2015-07-18 DIAGNOSIS — I129 Hypertensive chronic kidney disease with stage 1 through stage 4 chronic kidney disease, or unspecified chronic kidney disease: Secondary | ICD-10-CM | POA: Diagnosis not present

## 2015-07-18 DIAGNOSIS — E782 Mixed hyperlipidemia: Secondary | ICD-10-CM | POA: Diagnosis not present

## 2015-07-18 DIAGNOSIS — K210 Gastro-esophageal reflux disease with esophagitis: Secondary | ICD-10-CM | POA: Diagnosis not present

## 2015-08-02 DIAGNOSIS — X32XXXD Exposure to sunlight, subsequent encounter: Secondary | ICD-10-CM | POA: Diagnosis not present

## 2015-08-02 DIAGNOSIS — C44329 Squamous cell carcinoma of skin of other parts of face: Secondary | ICD-10-CM | POA: Diagnosis not present

## 2015-08-02 DIAGNOSIS — D225 Melanocytic nevi of trunk: Secondary | ICD-10-CM | POA: Diagnosis not present

## 2015-08-02 DIAGNOSIS — C44320 Squamous cell carcinoma of skin of unspecified parts of face: Secondary | ICD-10-CM | POA: Diagnosis not present

## 2015-08-02 DIAGNOSIS — L570 Actinic keratosis: Secondary | ICD-10-CM | POA: Diagnosis not present

## 2015-09-21 DIAGNOSIS — R809 Proteinuria, unspecified: Secondary | ICD-10-CM | POA: Diagnosis not present

## 2015-09-21 DIAGNOSIS — D509 Iron deficiency anemia, unspecified: Secondary | ICD-10-CM | POA: Diagnosis not present

## 2015-09-21 DIAGNOSIS — E559 Vitamin D deficiency, unspecified: Secondary | ICD-10-CM | POA: Diagnosis not present

## 2015-09-21 DIAGNOSIS — I10 Essential (primary) hypertension: Secondary | ICD-10-CM | POA: Diagnosis not present

## 2015-09-21 DIAGNOSIS — N183 Chronic kidney disease, stage 3 (moderate): Secondary | ICD-10-CM | POA: Diagnosis not present

## 2015-09-21 DIAGNOSIS — Z79899 Other long term (current) drug therapy: Secondary | ICD-10-CM | POA: Diagnosis not present

## 2015-09-27 DIAGNOSIS — D649 Anemia, unspecified: Secondary | ICD-10-CM | POA: Diagnosis not present

## 2015-09-27 DIAGNOSIS — I10 Essential (primary) hypertension: Secondary | ICD-10-CM | POA: Diagnosis not present

## 2015-09-27 DIAGNOSIS — R809 Proteinuria, unspecified: Secondary | ICD-10-CM | POA: Diagnosis not present

## 2015-09-27 DIAGNOSIS — I509 Heart failure, unspecified: Secondary | ICD-10-CM | POA: Diagnosis not present

## 2015-09-27 DIAGNOSIS — E1129 Type 2 diabetes mellitus with other diabetic kidney complication: Secondary | ICD-10-CM | POA: Diagnosis not present

## 2015-09-27 DIAGNOSIS — N183 Chronic kidney disease, stage 3 (moderate): Secondary | ICD-10-CM | POA: Diagnosis not present

## 2015-10-05 DIAGNOSIS — E1121 Type 2 diabetes mellitus with diabetic nephropathy: Secondary | ICD-10-CM | POA: Diagnosis not present

## 2015-10-05 DIAGNOSIS — I129 Hypertensive chronic kidney disease with stage 1 through stage 4 chronic kidney disease, or unspecified chronic kidney disease: Secondary | ICD-10-CM | POA: Diagnosis not present

## 2015-10-05 DIAGNOSIS — M545 Low back pain: Secondary | ICD-10-CM | POA: Diagnosis not present

## 2015-10-05 DIAGNOSIS — J441 Chronic obstructive pulmonary disease with (acute) exacerbation: Secondary | ICD-10-CM | POA: Diagnosis not present

## 2016-01-03 DIAGNOSIS — E1121 Type 2 diabetes mellitus with diabetic nephropathy: Secondary | ICD-10-CM | POA: Diagnosis not present

## 2016-01-03 DIAGNOSIS — M1990 Unspecified osteoarthritis, unspecified site: Secondary | ICD-10-CM | POA: Diagnosis not present

## 2016-01-03 DIAGNOSIS — J441 Chronic obstructive pulmonary disease with (acute) exacerbation: Secondary | ICD-10-CM | POA: Diagnosis not present

## 2016-01-03 DIAGNOSIS — I129 Hypertensive chronic kidney disease with stage 1 through stage 4 chronic kidney disease, or unspecified chronic kidney disease: Secondary | ICD-10-CM | POA: Diagnosis not present

## 2016-03-30 DIAGNOSIS — D509 Iron deficiency anemia, unspecified: Secondary | ICD-10-CM | POA: Diagnosis not present

## 2016-03-30 DIAGNOSIS — E559 Vitamin D deficiency, unspecified: Secondary | ICD-10-CM | POA: Diagnosis not present

## 2016-03-30 DIAGNOSIS — R809 Proteinuria, unspecified: Secondary | ICD-10-CM | POA: Diagnosis not present

## 2016-03-30 DIAGNOSIS — N183 Chronic kidney disease, stage 3 (moderate): Secondary | ICD-10-CM | POA: Diagnosis not present

## 2016-03-30 DIAGNOSIS — I10 Essential (primary) hypertension: Secondary | ICD-10-CM | POA: Diagnosis not present

## 2016-03-30 DIAGNOSIS — Z79899 Other long term (current) drug therapy: Secondary | ICD-10-CM | POA: Diagnosis not present

## 2016-04-03 DIAGNOSIS — I509 Heart failure, unspecified: Secondary | ICD-10-CM | POA: Diagnosis not present

## 2016-04-03 DIAGNOSIS — R809 Proteinuria, unspecified: Secondary | ICD-10-CM | POA: Diagnosis not present

## 2016-04-03 DIAGNOSIS — N183 Chronic kidney disease, stage 3 (moderate): Secondary | ICD-10-CM | POA: Diagnosis not present

## 2016-04-03 DIAGNOSIS — E1129 Type 2 diabetes mellitus with other diabetic kidney complication: Secondary | ICD-10-CM | POA: Diagnosis not present

## 2016-04-03 DIAGNOSIS — I10 Essential (primary) hypertension: Secondary | ICD-10-CM | POA: Diagnosis not present

## 2016-04-03 DIAGNOSIS — D649 Anemia, unspecified: Secondary | ICD-10-CM | POA: Diagnosis not present

## 2016-04-05 DIAGNOSIS — J449 Chronic obstructive pulmonary disease, unspecified: Secondary | ICD-10-CM | POA: Diagnosis not present

## 2016-04-05 DIAGNOSIS — E1165 Type 2 diabetes mellitus with hyperglycemia: Secondary | ICD-10-CM | POA: Diagnosis not present

## 2016-04-05 DIAGNOSIS — I129 Hypertensive chronic kidney disease with stage 1 through stage 4 chronic kidney disease, or unspecified chronic kidney disease: Secondary | ICD-10-CM | POA: Diagnosis not present

## 2016-04-05 DIAGNOSIS — N390 Urinary tract infection, site not specified: Secondary | ICD-10-CM | POA: Diagnosis not present

## 2016-04-26 ENCOUNTER — Other Ambulatory Visit: Payer: Self-pay

## 2016-07-05 DIAGNOSIS — N183 Chronic kidney disease, stage 3 (moderate): Secondary | ICD-10-CM | POA: Diagnosis not present

## 2016-07-05 DIAGNOSIS — Z23 Encounter for immunization: Secondary | ICD-10-CM | POA: Diagnosis not present

## 2016-07-05 DIAGNOSIS — F329 Major depressive disorder, single episode, unspecified: Secondary | ICD-10-CM | POA: Diagnosis not present

## 2016-07-05 DIAGNOSIS — E1121 Type 2 diabetes mellitus with diabetic nephropathy: Secondary | ICD-10-CM | POA: Diagnosis not present

## 2016-07-05 DIAGNOSIS — J449 Chronic obstructive pulmonary disease, unspecified: Secondary | ICD-10-CM | POA: Diagnosis not present

## 2016-08-02 DIAGNOSIS — I10 Essential (primary) hypertension: Secondary | ICD-10-CM | POA: Diagnosis not present

## 2016-08-02 DIAGNOSIS — Z79899 Other long term (current) drug therapy: Secondary | ICD-10-CM | POA: Diagnosis not present

## 2016-08-02 DIAGNOSIS — E559 Vitamin D deficiency, unspecified: Secondary | ICD-10-CM | POA: Diagnosis not present

## 2016-08-02 DIAGNOSIS — R809 Proteinuria, unspecified: Secondary | ICD-10-CM | POA: Diagnosis not present

## 2016-08-02 DIAGNOSIS — N183 Chronic kidney disease, stage 3 (moderate): Secondary | ICD-10-CM | POA: Diagnosis not present

## 2016-08-02 DIAGNOSIS — D509 Iron deficiency anemia, unspecified: Secondary | ICD-10-CM | POA: Diagnosis not present

## 2016-08-07 DIAGNOSIS — I509 Heart failure, unspecified: Secondary | ICD-10-CM | POA: Diagnosis not present

## 2016-08-07 DIAGNOSIS — N183 Chronic kidney disease, stage 3 (moderate): Secondary | ICD-10-CM | POA: Diagnosis not present

## 2016-08-07 DIAGNOSIS — E1129 Type 2 diabetes mellitus with other diabetic kidney complication: Secondary | ICD-10-CM | POA: Diagnosis not present

## 2016-08-07 DIAGNOSIS — I10 Essential (primary) hypertension: Secondary | ICD-10-CM | POA: Diagnosis not present

## 2016-10-04 DIAGNOSIS — J441 Chronic obstructive pulmonary disease with (acute) exacerbation: Secondary | ICD-10-CM | POA: Diagnosis not present

## 2016-10-04 DIAGNOSIS — E1121 Type 2 diabetes mellitus with diabetic nephropathy: Secondary | ICD-10-CM | POA: Diagnosis not present

## 2016-10-04 DIAGNOSIS — I129 Hypertensive chronic kidney disease with stage 1 through stage 4 chronic kidney disease, or unspecified chronic kidney disease: Secondary | ICD-10-CM | POA: Diagnosis not present

## 2016-10-04 DIAGNOSIS — I10 Essential (primary) hypertension: Secondary | ICD-10-CM | POA: Diagnosis not present

## 2016-10-23 DIAGNOSIS — H2511 Age-related nuclear cataract, right eye: Secondary | ICD-10-CM | POA: Diagnosis not present

## 2016-10-23 DIAGNOSIS — H2513 Age-related nuclear cataract, bilateral: Secondary | ICD-10-CM | POA: Diagnosis not present

## 2016-10-23 DIAGNOSIS — H401133 Primary open-angle glaucoma, bilateral, severe stage: Secondary | ICD-10-CM | POA: Diagnosis not present

## 2016-10-30 DIAGNOSIS — H401132 Primary open-angle glaucoma, bilateral, moderate stage: Secondary | ICD-10-CM | POA: Diagnosis not present

## 2016-10-30 DIAGNOSIS — H2513 Age-related nuclear cataract, bilateral: Secondary | ICD-10-CM | POA: Diagnosis not present

## 2016-11-06 DIAGNOSIS — H401133 Primary open-angle glaucoma, bilateral, severe stage: Secondary | ICD-10-CM | POA: Diagnosis not present

## 2016-11-12 DIAGNOSIS — H2181 Floppy iris syndrome: Secondary | ICD-10-CM | POA: Diagnosis not present

## 2016-11-12 DIAGNOSIS — H401112 Primary open-angle glaucoma, right eye, moderate stage: Secondary | ICD-10-CM | POA: Diagnosis not present

## 2016-11-12 DIAGNOSIS — H2511 Age-related nuclear cataract, right eye: Secondary | ICD-10-CM | POA: Diagnosis not present

## 2016-11-12 DIAGNOSIS — H401413 Capsular glaucoma with pseudoexfoliation of lens, right eye, severe stage: Secondary | ICD-10-CM | POA: Diagnosis not present

## 2016-12-04 DIAGNOSIS — H2512 Age-related nuclear cataract, left eye: Secondary | ICD-10-CM | POA: Diagnosis not present

## 2016-12-10 DIAGNOSIS — H2512 Age-related nuclear cataract, left eye: Secondary | ICD-10-CM | POA: Diagnosis not present

## 2016-12-10 DIAGNOSIS — H21562 Pupillary abnormality, left eye: Secondary | ICD-10-CM | POA: Diagnosis not present

## 2016-12-10 DIAGNOSIS — H401113 Primary open-angle glaucoma, right eye, severe stage: Secondary | ICD-10-CM | POA: Diagnosis not present

## 2016-12-10 DIAGNOSIS — H5703 Miosis: Secondary | ICD-10-CM | POA: Diagnosis not present

## 2016-12-10 DIAGNOSIS — H401122 Primary open-angle glaucoma, left eye, moderate stage: Secondary | ICD-10-CM | POA: Diagnosis not present

## 2016-12-11 DIAGNOSIS — N183 Chronic kidney disease, stage 3 (moderate): Secondary | ICD-10-CM | POA: Diagnosis not present

## 2016-12-11 DIAGNOSIS — D509 Iron deficiency anemia, unspecified: Secondary | ICD-10-CM | POA: Diagnosis not present

## 2016-12-11 DIAGNOSIS — Z79899 Other long term (current) drug therapy: Secondary | ICD-10-CM | POA: Diagnosis not present

## 2016-12-11 DIAGNOSIS — R809 Proteinuria, unspecified: Secondary | ICD-10-CM | POA: Diagnosis not present

## 2016-12-11 DIAGNOSIS — E559 Vitamin D deficiency, unspecified: Secondary | ICD-10-CM | POA: Diagnosis not present

## 2016-12-11 DIAGNOSIS — I10 Essential (primary) hypertension: Secondary | ICD-10-CM | POA: Diagnosis not present

## 2016-12-18 DIAGNOSIS — E1129 Type 2 diabetes mellitus with other diabetic kidney complication: Secondary | ICD-10-CM | POA: Diagnosis not present

## 2016-12-18 DIAGNOSIS — I10 Essential (primary) hypertension: Secondary | ICD-10-CM | POA: Diagnosis not present

## 2016-12-18 DIAGNOSIS — I509 Heart failure, unspecified: Secondary | ICD-10-CM | POA: Diagnosis not present

## 2016-12-18 DIAGNOSIS — N183 Chronic kidney disease, stage 3 (moderate): Secondary | ICD-10-CM | POA: Diagnosis not present

## 2016-12-18 DIAGNOSIS — D649 Anemia, unspecified: Secondary | ICD-10-CM | POA: Diagnosis not present

## 2016-12-18 DIAGNOSIS — R809 Proteinuria, unspecified: Secondary | ICD-10-CM | POA: Diagnosis not present

## 2017-01-03 DIAGNOSIS — E1121 Type 2 diabetes mellitus with diabetic nephropathy: Secondary | ICD-10-CM | POA: Diagnosis not present

## 2017-01-03 DIAGNOSIS — I129 Hypertensive chronic kidney disease with stage 1 through stage 4 chronic kidney disease, or unspecified chronic kidney disease: Secondary | ICD-10-CM | POA: Diagnosis not present

## 2017-01-03 DIAGNOSIS — J449 Chronic obstructive pulmonary disease, unspecified: Secondary | ICD-10-CM | POA: Diagnosis not present

## 2017-01-03 DIAGNOSIS — N184 Chronic kidney disease, stage 4 (severe): Secondary | ICD-10-CM | POA: Diagnosis not present

## 2017-03-14 DIAGNOSIS — Z961 Presence of intraocular lens: Secondary | ICD-10-CM | POA: Diagnosis not present

## 2017-03-14 DIAGNOSIS — H401133 Primary open-angle glaucoma, bilateral, severe stage: Secondary | ICD-10-CM | POA: Diagnosis not present

## 2017-03-21 DIAGNOSIS — H401133 Primary open-angle glaucoma, bilateral, severe stage: Secondary | ICD-10-CM | POA: Diagnosis not present

## 2017-04-04 DIAGNOSIS — N184 Chronic kidney disease, stage 4 (severe): Secondary | ICD-10-CM | POA: Diagnosis not present

## 2017-04-04 DIAGNOSIS — F419 Anxiety disorder, unspecified: Secondary | ICD-10-CM | POA: Diagnosis not present

## 2017-04-04 DIAGNOSIS — K210 Gastro-esophageal reflux disease with esophagitis: Secondary | ICD-10-CM | POA: Diagnosis not present

## 2017-04-04 DIAGNOSIS — J449 Chronic obstructive pulmonary disease, unspecified: Secondary | ICD-10-CM | POA: Diagnosis not present

## 2017-04-11 DIAGNOSIS — H401133 Primary open-angle glaucoma, bilateral, severe stage: Secondary | ICD-10-CM | POA: Diagnosis not present

## 2017-04-23 DIAGNOSIS — D509 Iron deficiency anemia, unspecified: Secondary | ICD-10-CM | POA: Diagnosis not present

## 2017-04-23 DIAGNOSIS — Z79899 Other long term (current) drug therapy: Secondary | ICD-10-CM | POA: Diagnosis not present

## 2017-04-23 DIAGNOSIS — N183 Chronic kidney disease, stage 3 (moderate): Secondary | ICD-10-CM | POA: Diagnosis not present

## 2017-04-23 DIAGNOSIS — I10 Essential (primary) hypertension: Secondary | ICD-10-CM | POA: Diagnosis not present

## 2017-04-23 DIAGNOSIS — R809 Proteinuria, unspecified: Secondary | ICD-10-CM | POA: Diagnosis not present

## 2017-04-23 DIAGNOSIS — E559 Vitamin D deficiency, unspecified: Secondary | ICD-10-CM | POA: Diagnosis not present

## 2017-05-07 DIAGNOSIS — E1129 Type 2 diabetes mellitus with other diabetic kidney complication: Secondary | ICD-10-CM | POA: Diagnosis not present

## 2017-05-07 DIAGNOSIS — I10 Essential (primary) hypertension: Secondary | ICD-10-CM | POA: Diagnosis not present

## 2017-05-07 DIAGNOSIS — D649 Anemia, unspecified: Secondary | ICD-10-CM | POA: Diagnosis not present

## 2017-05-07 DIAGNOSIS — I509 Heart failure, unspecified: Secondary | ICD-10-CM | POA: Diagnosis not present

## 2017-05-07 DIAGNOSIS — N183 Chronic kidney disease, stage 3 (moderate): Secondary | ICD-10-CM | POA: Diagnosis not present

## 2017-05-07 DIAGNOSIS — R809 Proteinuria, unspecified: Secondary | ICD-10-CM | POA: Diagnosis not present

## 2017-05-07 DIAGNOSIS — N250 Renal osteodystrophy: Secondary | ICD-10-CM | POA: Diagnosis not present

## 2017-05-23 DIAGNOSIS — H401133 Primary open-angle glaucoma, bilateral, severe stage: Secondary | ICD-10-CM | POA: Diagnosis not present

## 2017-05-23 DIAGNOSIS — Z961 Presence of intraocular lens: Secondary | ICD-10-CM | POA: Diagnosis not present

## 2017-06-30 ENCOUNTER — Other Ambulatory Visit: Payer: Self-pay

## 2017-06-30 ENCOUNTER — Encounter (HOSPITAL_COMMUNITY): Payer: Self-pay

## 2017-06-30 ENCOUNTER — Emergency Department (HOSPITAL_COMMUNITY): Payer: Medicare Other

## 2017-06-30 ENCOUNTER — Inpatient Hospital Stay (HOSPITAL_COMMUNITY)
Admission: EM | Admit: 2017-06-30 | Discharge: 2017-07-05 | DRG: 194 | Disposition: A | Discharge status: Home / Self Care | Payer: Medicare Other | Attending: Pulmonary Disease | Admitting: Pulmonary Disease

## 2017-06-30 DIAGNOSIS — E7800 Pure hypercholesterolemia, unspecified: Secondary | ICD-10-CM | POA: Diagnosis present

## 2017-06-30 DIAGNOSIS — J440 Chronic obstructive pulmonary disease with acute lower respiratory infection: Secondary | ICD-10-CM | POA: Diagnosis present

## 2017-06-30 DIAGNOSIS — F410 Panic disorder [episodic paroxysmal anxiety] without agoraphobia: Secondary | ICD-10-CM | POA: Diagnosis present

## 2017-06-30 DIAGNOSIS — H409 Unspecified glaucoma: Secondary | ICD-10-CM | POA: Diagnosis present

## 2017-06-30 DIAGNOSIS — J441 Chronic obstructive pulmonary disease with (acute) exacerbation: Secondary | ICD-10-CM | POA: Diagnosis present

## 2017-06-30 DIAGNOSIS — E119 Type 2 diabetes mellitus without complications: Secondary | ICD-10-CM | POA: Diagnosis present

## 2017-06-30 DIAGNOSIS — F419 Anxiety disorder, unspecified: Secondary | ICD-10-CM | POA: Diagnosis present

## 2017-06-30 DIAGNOSIS — K219 Gastro-esophageal reflux disease without esophagitis: Secondary | ICD-10-CM | POA: Diagnosis present

## 2017-06-30 DIAGNOSIS — H548 Legal blindness, as defined in USA: Secondary | ICD-10-CM | POA: Diagnosis present

## 2017-06-30 DIAGNOSIS — Z7984 Long term (current) use of oral hypoglycemic drugs: Secondary | ICD-10-CM | POA: Diagnosis not present

## 2017-06-30 DIAGNOSIS — J449 Chronic obstructive pulmonary disease, unspecified: Secondary | ICD-10-CM | POA: Diagnosis not present

## 2017-06-30 DIAGNOSIS — R05 Cough: Secondary | ICD-10-CM | POA: Diagnosis not present

## 2017-06-30 DIAGNOSIS — Z885 Allergy status to narcotic agent status: Secondary | ICD-10-CM | POA: Diagnosis not present

## 2017-06-30 DIAGNOSIS — Z23 Encounter for immunization: Secondary | ICD-10-CM

## 2017-06-30 DIAGNOSIS — B029 Zoster without complications: Secondary | ICD-10-CM | POA: Diagnosis present

## 2017-06-30 DIAGNOSIS — J189 Pneumonia, unspecified organism: Secondary | ICD-10-CM | POA: Diagnosis not present

## 2017-06-30 DIAGNOSIS — F329 Major depressive disorder, single episode, unspecified: Secondary | ICD-10-CM | POA: Diagnosis present

## 2017-06-30 HISTORY — DX: Type 2 diabetes mellitus without complications: E11.9

## 2017-06-30 HISTORY — DX: Unspecified glaucoma: H40.9

## 2017-06-30 LAB — COMPREHENSIVE METABOLIC PANEL
ALT: 13 U/L — ABNORMAL LOW (ref 14–54)
ANION GAP: 12 (ref 5–15)
AST: 24 U/L (ref 15–41)
Albumin: 3.9 g/dL (ref 3.5–5.0)
Alkaline Phosphatase: 69 U/L (ref 38–126)
BUN: 34 mg/dL — AB (ref 6–20)
CALCIUM: 9.1 mg/dL (ref 8.9–10.3)
CHLORIDE: 106 mmol/L (ref 101–111)
CO2: 21 mmol/L — ABNORMAL LOW (ref 22–32)
Creatinine, Ser: 1.71 mg/dL — ABNORMAL HIGH (ref 0.44–1.00)
GFR calc Af Amer: 30 mL/min — ABNORMAL LOW (ref >60)
GFR, EST NON AFRICAN AMERICAN: 26 mL/min — AB (ref >60)
Glucose, Bld: 256 mg/dL — ABNORMAL HIGH (ref 65–99)
POTASSIUM: 4 mmol/L (ref 3.5–5.1)
Sodium: 139 mmol/L (ref 135–145)
Total Bilirubin: 0.7 mg/dL (ref 0.3–1.2)
Total Protein: 7.1 g/dL (ref 6.5–8.1)

## 2017-06-30 LAB — CBC WITH DIFFERENTIAL/PLATELET
BASOS ABS: 0 10*3/uL (ref 0.0–0.1)
BASOS PCT: 0 %
EOS PCT: 0 %
Eosinophils Absolute: 0 10*3/uL (ref 0.0–0.7)
HCT: 40 % (ref 36.0–46.0)
Hemoglobin: 14 g/dL (ref 12.0–15.0)
LYMPHS PCT: 12 %
Lymphs Abs: 1.2 10*3/uL (ref 0.7–4.0)
MCH: 32.1 pg (ref 26.0–34.0)
MCHC: 35 g/dL (ref 30.0–36.0)
MCV: 91.7 fL (ref 78.0–100.0)
MONO ABS: 0.9 10*3/uL (ref 0.1–1.0)
Monocytes Relative: 8 %
Neutro Abs: 8.4 10*3/uL — ABNORMAL HIGH (ref 1.7–7.7)
Neutrophils Relative %: 80 %
PLATELETS: 219 10*3/uL (ref 150–400)
RBC: 4.36 MIL/uL (ref 3.87–5.11)
RDW: 13.4 % (ref 11.5–15.5)
WBC: 10.6 10*3/uL — ABNORMAL HIGH (ref 4.0–10.5)

## 2017-06-30 LAB — GLUCOSE, CAPILLARY: Glucose-Capillary: 336 mg/dL — ABNORMAL HIGH (ref 65–99)

## 2017-06-30 MED ORDER — LEVOFLOXACIN 500 MG PO TABS
500.0000 mg | ORAL_TABLET | Freq: Once | ORAL | Status: AC
  Administered 2017-06-30: 500 mg via ORAL
  Filled 2017-06-30: qty 1

## 2017-06-30 MED ORDER — FUROSEMIDE 20 MG PO TABS: 20.0000 mg | ORAL_TABLET | Freq: Every day | ORAL | Status: DC | PRN

## 2017-06-30 MED ORDER — BRIMONIDINE TARTRATE 0.15 % OP SOLN
1.0000 [drp] | Freq: Two times a day (BID) | OPHTHALMIC | Status: DC
  Administered 2017-07-02: 1 [drp] via OPHTHALMIC
  Filled 2017-06-30: qty 5

## 2017-06-30 MED ORDER — INFLUENZA VAC SPLIT HIGH-DOSE 0.5 ML IM SUSY
0.5000 mL | PREFILLED_SYRINGE | INTRAMUSCULAR | Status: AC
  Administered 2017-07-01: 0.5 mL via INTRAMUSCULAR
  Filled 2017-06-30: qty 0.5

## 2017-06-30 MED ORDER — ACETAMINOPHEN 500 MG PO TABS: 500.0000 mg | ORAL_TABLET | Freq: Four times a day (QID) | ORAL | Status: DC | PRN

## 2017-06-30 MED ORDER — ALBUTEROL SULFATE (2.5 MG/3ML) 0.083% IN NEBU
2.5000 mg | INHALATION_SOLUTION | Freq: Once | RESPIRATORY_TRACT | Status: AC
  Administered 2017-06-30: 2.5 mg via RESPIRATORY_TRACT
  Filled 2017-06-30: qty 3

## 2017-06-30 MED ORDER — PANTOPRAZOLE SODIUM 40 MG PO TBEC
40.0000 mg | DELAYED_RELEASE_TABLET | Freq: Every day | ORAL | Status: DC
  Administered 2017-07-01 – 2017-07-05 (×5): 40 mg via ORAL
  Filled 2017-06-30 (×5): qty 1

## 2017-06-30 MED ORDER — DORZOLAMIDE HCL 2 % OP SOLN
1.0000 [drp] | Freq: Two times a day (BID) | OPHTHALMIC | Status: DC
  Administered 2017-07-02 – 2017-07-04 (×4): 1 [drp] via OPHTHALMIC
  Filled 2017-06-30 (×3): qty 10

## 2017-06-30 MED ORDER — IPRATROPIUM-ALBUTEROL 0.5-2.5 (3) MG/3ML IN SOLN
3.0000 mL | Freq: Four times a day (QID) | RESPIRATORY_TRACT | Status: DC
  Administered 2017-07-01 – 2017-07-03 (×12): 3 mL via RESPIRATORY_TRACT
  Filled 2017-06-30 (×12): qty 3

## 2017-06-30 MED ORDER — GLIPIZIDE 5 MG PO TABS
5.0000 mg | ORAL_TABLET | Freq: Two times a day (BID) | ORAL | Status: DC
  Administered 2017-07-01 – 2017-07-05 (×10): 5 mg via ORAL
  Filled 2017-06-30 (×10): qty 1

## 2017-06-30 MED ORDER — BENZONATATE 100 MG PO CAPS: 100.0000 mg | ORAL_CAPSULE | Freq: Three times a day (TID) | ORAL | Status: DC | PRN

## 2017-06-30 MED ORDER — ENOXAPARIN SODIUM 30 MG/0.3ML ~~LOC~~ SOLN
30.0000 mg | SUBCUTANEOUS | Status: DC
  Administered 2017-06-30 – 2017-07-04 (×5): 30 mg via SUBCUTANEOUS
  Filled 2017-06-30 (×5): qty 0.3

## 2017-06-30 MED ORDER — INSULIN ASPART 100 UNIT/ML ~~LOC~~ SOLN
0.0000 [IU] | Freq: Three times a day (TID) | SUBCUTANEOUS | Status: DC
  Administered 2017-07-01: 11 [IU] via SUBCUTANEOUS

## 2017-06-30 MED ORDER — KETOROLAC TROMETHAMINE 15 MG/ML IJ SOLN
15.0000 mg | Freq: Once | INTRAMUSCULAR | Status: AC
  Administered 2017-06-30: 15 mg via INTRAVENOUS
  Filled 2017-06-30: qty 1

## 2017-06-30 MED ORDER — BUSPIRONE HCL 5 MG PO TABS: 5.0000 mg | ORAL_TABLET | Freq: Two times a day (BID) | ORAL | Status: DC | PRN

## 2017-06-30 MED ORDER — IPRATROPIUM-ALBUTEROL 0.5-2.5 (3) MG/3ML IN SOLN
3.0000 mL | Freq: Once | RESPIRATORY_TRACT | Status: AC
  Administered 2017-06-30: 3 mL via RESPIRATORY_TRACT
  Filled 2017-06-30: qty 3

## 2017-06-30 MED ORDER — INSULIN ASPART 100 UNIT/ML ~~LOC~~ SOLN
8.0000 [IU] | Freq: Once | SUBCUTANEOUS | Status: AC
  Administered 2017-06-30: 8 [IU] via SUBCUTANEOUS

## 2017-06-30 MED ORDER — LEVOFLOXACIN 500 MG PO TABS
500.0000 mg | ORAL_TABLET | ORAL | Status: DC
  Administered 2017-07-02 – 2017-07-04 (×2): 500 mg via ORAL
  Filled 2017-06-30 (×2): qty 1

## 2017-06-30 MED ORDER — ALBUTEROL SULFATE (2.5 MG/3ML) 0.083% IN NEBU: 2.5000 mg | INHALATION_SOLUTION | RESPIRATORY_TRACT | Status: DC | PRN

## 2017-06-30 MED ORDER — LATANOPROST 0.005 % OP SOLN
1.0000 [drp] | Freq: Every day | OPHTHALMIC | Status: DC
  Filled 2017-06-30 (×2): qty 2.5

## 2017-06-30 MED ORDER — ACETAMINOPHEN 325 MG PO TABS
650.0000 mg | ORAL_TABLET | Freq: Once | ORAL | Status: AC
  Administered 2017-06-30: 650 mg via ORAL
  Filled 2017-06-30: qty 2

## 2017-06-30 MED ORDER — METHYLPREDNISOLONE SODIUM SUCC 125 MG IJ SOLR
125.0000 mg | Freq: Once | INTRAMUSCULAR | Status: AC
  Administered 2017-06-30: 125 mg via INTRAVENOUS
  Filled 2017-06-30: qty 2

## 2017-06-30 NOTE — H&P (Signed)
History and Physical    [name]:[id number] DOB: 02/27/1929 DOA: 06/30/2017  PCP: Sinda Du, MD   Patient coming from: Home.  I have personally briefly reviewed patient's old medical records in [place]  Chief Complaint: Cough and fever.  HPI: [name] is a 83 y.o. female with medical history significant of GERD, anxiety/depression/panic attacks, COPD, type 2 diabetes, glaucoma (legally blind), hyperlipidemia with coming to the emergency department with complaints of productive cough with yellowish sputum for about a week, associated with fatigue, dyspnea, wheezing partially relieved by bronchodilators and fever since earlier today.  He also complains of decreased appetite, increased somnolence, posterior neck, bilateral shoulder and back pain, decreased urine volume and lightheadedness.  She denies travel history or sick contacts.  She denies rhinorrhea, sore throat, chest pain, palpitations, pitting edema of the lower extremities, PND or orthopnea.  No abdominal pain, nausea, emesis, constipation, melena or hematochezia.  She mentions having an episode of mildly loose stools today.  Denies dysuria, frequency, hematuria, but complains of decreased urine volume over the past several days.  Denies pruritus or skin rashes.  No polyuria, polydipsia or polyphagia.  ED Course: Initial vital signs in the emergency department temperature 99.45F, later increased to 100.4, pulse 97, respirations 22, blood pressure 120/75 mmHg and O2 sat 97% on room air.  She received acetaminophen 650 mg p.o. x1 dose, a DuoNeb, an albuterol neb 2.5 mg,, supplemental oxygen, Solu-Medrol 125 mg IVP and Levaquin 500 mg orally x1 dose.  I also ordered Toradol 15 mg IVP x1 for persistent fever and myalgias.  Lab work shows WBC 10.6 with 80% neutrophils, hemoglobin 14 g/dL and platelets 219.  Her CMP shows a bicarbonate of 21 mmol/L.  BUN of 34, creatinine of 1.71 and glucose 256 mg/dL.  All  other values of the CMP, including LFTs, are within normal limits.  Her chest radiograph showed hyperinflation, a streaky opacity in the lingula which could be due to atelectasis or infiltrate.  Review of Systems: As per HPI otherwise 10 point review of systems negative.    Past Medical History:  Diagnosis Date  . Acid reflux   . Anxiety   . COPD (chronic obstructive pulmonary disease) ([place])   . Depressed   . Diabetes mellitus   . Glaucoma   . Hypercholesteremia   . Legally blind in right eye, as defined in [place]   . Panic attacks   . Type 2 diabetes mellitus ([place])     Past Surgical History:  Procedure Laterality Date  . HIP SURGERY       reports that  has never smoked. she has never used smokeless tobacco. She reports that she does not drink alcohol or use drugs.  Allergies  Allergen Reactions  . Hydromorphone Hcl Other (See Comments)    Patient goes out of right state of mind.     Family History  Problem Relation Age of Onset  . Blindness Father   . Brain cancer Sister   . Alzheimer's disease Brother     Prior to Admission medications   Medication Sig Start Date End Date Taking? Authorizing Provider  busPIRone (BUSPAR) 5 MG tablet Take 5 mg 2 (two) times daily as needed by mouth.   Yes [provider]  cholecalciferol (VITAMIN D) 1000 units tablet Take 1,000 Units daily by mouth.   Yes [provider]  furosemide (LASIX) 20 MG tablet Take 20 mg daily as needed by mouth for fluid.   Yes  [provider]  acetaminophen (TYLENOL) 500 MG tablet Take 500 mg by mouth every 6 (six) hours as needed for mild pain or moderate pain.    [provider]  amLODipine (NORVASC) 5 MG tablet Take 5 mg by mouth daily.      [provider]  brimonidine (ALPHAGAN P) 0.1 % SOLN Place 1 drop into both eyes 2 (two) times daily.      [provider]  dorzolamide (TRUSOPT) 2 % ophthalmic solution Place 1 drop into both eyes 2 (two) times  daily.      [provider]  glipiZIDE (GLUCOTROL) 5 MG tablet Take 5 mg 2 (two) times daily by mouth.     [provider]  ipratropium-albuterol (DUONEB) 0.5-2.5 (3) MG/3ML SOLN Take 3 mLs by nebulization every 4 (four) hours as needed. Shortness of Breath    [provider]  latanoprost (XALATAN) 0.005 % ophthalmic solution Place 1 drop into both eyes at bedtime.      [provider]  omeprazole (PRILOSEC) 20 MG capsule Take 20 mg by mouth daily.      [provider]  oxyCODONE-acetaminophen (PERCOCET/ROXICET) 5-325 MG per tablet Take 1 tablet by mouth every 6 (six) hours as needed for severe pain. 04/06/15   Rolland Porter, MD  pravastatin (PRAVACHOL) 40 MG tablet Take 40 mg by mouth daily.      [provider]  traMADol (ULTRAM) 50 MG tablet Take 1 tablet (50 mg total) by mouth every 6 (six) hours as needed for moderate pain. 04/05/15   Dorie Rank, MD    Physical Exam: Vitals:   06/30/17 1946 06/30/17 2000 06/30/17 2030 06/30/17 2100  BP: 109/72 118/65 114/82 102/68  Pulse: (!) 107 (!) 105 95 (!) 110  Resp:      Temp:      TempSrc:      SpO2: 96% 92% 93% 95%  Weight:      Height:        Constitutional: Looks acutely ill, mildly febrile. Eyes: PERRL, lids and conjunctivae normal ENMT: Mucous membranes are mildly dry. Posterior pharynx clear of any exudate or lesions. Neck: normal, supple, no masses, no thyromegaly Respiratory: Decreased breath sounds with bilateral wheezing, no crackles.  Mildly tachypneic at 23 breaths/min. No accessory muscle use.  Cardiovascular: Tachycardic at 104 bpm, no murmurs / rubs / gallops. No extremity edema. 2+ pedal pulses. No carotid bruits.  Abdomen: Soft, no tenderness, no masses palpated. No hepatosplenomegaly. Bowel sounds positive.  Musculoskeletal: no clubbing / cyanosis. Good ROM, no contractures. Normal muscle tone.  Skin: Small areas of ecchymosis in different stages of healing, extremities,  otherwise no significant rashes, lesions, ulcers on limited skin exam. Neurologic: CN 2-12 grossly intact. Sensation intact, DTR normal. Strength 5/5 in all 4.  Psychiatric: Normal judgment and insight. Alert and oriented x 4. Normal mood.    Labs on Admission: I have personally reviewed following labs and imaging studies  CBC: Recent Labs  Lab 06/30/17 1828  WBC 10.6*  NEUTROABS 8.4*  HGB 14.0  HCT 40.0  MCV 91.7  PLT 272   Basic Metabolic Panel: Recent Labs  Lab 06/30/17 1828  NA 139  K 4.0  CL 106  CO2 21*  GLUCOSE 256*  BUN 34*  CREATININE 1.71*  CALCIUM 9.1   GFR: Estimated Creatinine Clearance: 23.4 mL/min (A) (by C-G formula based on SCr of 1.71 mg/dL (H)). Liver Function Tests: Recent Labs  Lab 06/30/17 1828  AST 24  ALT 13*  ALKPHOS 69  BILITOT 0.7  PROT 7.1  ALBUMIN 3.9   No results for input(s): LIPASE, AMYLASE in the last 168 hours. No results for input(s): AMMONIA in the last 168 hours. Coagulation Profile: No results for input(s): INR, PROTIME in the last 168 hours. Cardiac Enzymes: No results for input(s): CKTOTAL, CKMB, CKMBINDEX, TROPONINI in the last 168 hours. BNP (last 3 results) No results for input(s): PROBNP in the last 8760 hours. HbA1C: No results for input(s): HGBA1C in the last 72 hours. CBG: No results for input(s): GLUCAP in the last 168 hours. Lipid Profile: No results for input(s): CHOL, HDL, LDLCALC, TRIG, CHOLHDL, LDLDIRECT in the last 72 hours. Thyroid Function Tests: No results for input(s): TSH, T4TOTAL, FREET4, T3FREE, THYROIDAB in the last 72 hours. Anemia Panel: No results for input(s): VITAMINB12, FOLATE, FERRITIN, TIBC, IRON, RETICCTPCT in the last 72 hours. Urine analysis:  Radiological Exams on Admission: Dg Chest 2 View  Result Date: 06/30/2017 CLINICAL DATA:  Cough congestion and fever EXAM: CHEST  2 VIEW COMPARISON:  07/17/2011 FINDINGS: Streaky opacity at the lingula. Hyperinflation. No pleural effusion.  Mild cardiomegaly with aortic atherosclerosis. No pneumothorax. Possible distal resection of right clavicle. IMPRESSION: 1. Streaky opacity at the lingula, may reflect atelectasis scar or infiltrate 2. Mild cardiomegaly Electronically Signed   By: Donavan Foil M.D.   On: 06/30/2017 19:01    EKG: Independently reviewed   Assessment/Plan Principal Problem:   CAP (community acquired pneumonia) Admit to telemetry/inpatient. Continue supplemental oxygen. Continue scheduled and as needed bronchodilators. Single dose Solu-Medrol 125 mg IVP. Levaquin orally per pharmacy. Check sputum Gram stain, culture and sensitivity. Check strep pneumonia urinary antigen.  Active Problems:   Type 2 diabetes mellitus (HCC) Carbohydrate modified diet.    COPD (chronic obstructive pulmonary disease) (HCC) Continue supplemental oxygen. She received Solu-Medrol 125 mg IVP x1 in the ED. Continuous N as needed bronchodilators.    Hypercholesteremia Hold pravastatin until dose is confirmed. LFTs are unremarkable, except for mildly elevated AST of 44 U/L.    Glaucoma Continue Alphagan, Trusopt and Xalatan drops.    Anxiety Continue BuSpar 5 mg p.o. 3 times daily twice daily as needed.    Acid reflux Protonix 40 mg p.o. daily.     DVT prophylaxis: Lovenox SQ. Code Status: Full code. Family Communication: Her granddaughters are present in the ED. Disposition Plan: Admit for COPD exacerbation and pneumonia treatment for 2-3 days. Consults called:  Admission status: Inpatient/telemetry.   Reubin Milan MD [facility] Pager [phone].  If 7PM-7AM, please contact night-coverage [email or web address] Password [email or web address]  06/30/2017, 10:00 PM

## 2017-06-30 NOTE — ED Notes (Signed)
Dr Roderic Palau in to recheck

## 2017-06-30 NOTE — Progress Notes (Signed)
PHARMACY NOTE:  ANTIMICROBIAL RENAL DOSAGE ADJUSTMENT  Current antimicrobial regimen includes a mismatch between antimicrobial dosage and estimated renal function.  As per policy approved by the Pharmacy & Therapeutics and Medical Executive Committees, the antimicrobial dosage will be adjusted accordingly.  Current antimicrobial dosage:  Levofloxacin 750mg  Q24 hours  Indication: PNA  Renal Function:  Estimated Creatinine Clearance: 23.9 mL/min (A) (by C-G formula based on SCr of 1.71 mg/dL (H)). []      On intermittent HD, scheduled: []      On CRRT    Antimicrobial dosage has been changed to:  Levofloxacin 500mg  Q48 hours  Additional comments:   Thank you for allowing pharmacy to be a part of this patient's care.  Nyra Capes, [facility] 06/30/2017 10:51 PM

## 2017-06-30 NOTE — ED Notes (Signed)
Pt ambulated in hall to check her sats- pt refused help to get off of the bed and struggled to stand erect with her walker- Her sats upon walking were 88 per cent, upon walking she dipped to 85 per cent sat and then began to rise to 91 per cent while returning to her bed

## 2017-06-30 NOTE — ED Provider Notes (Signed)
[facility] Provider Note   CSN: [id number] Arrival date & time: 06/30/17  1804     History   Chief Complaint Chief Complaint  Patient presents with  . Cough  . Fever    HPI [name] is a 83 y.o. female.  Patient complains of cough congestion and fever   The history is provided by the patient. No language interpreter was used.  Cough  This is a new problem. The current episode started 2 days ago. The problem occurs constantly. The cough is non-productive. The maximum temperature recorded prior to her arrival was 100 to 100.9 F. Pertinent negatives include no chest pain and no headaches. She has tried nothing for the symptoms.  Fever   Associated symptoms include cough. Pertinent negatives include no chest pain, no diarrhea, no congestion and no headaches.    Past Medical History:  Diagnosis Date  . Acid reflux   . Anxiety   . COPD (chronic obstructive pulmonary disease) ([place])   . Depressed   . Diabetes mellitus   . Hypercholesteremia   . Legally blind in right eye, as defined in [place]   . Panic attacks     Patient Active Problem List   Diagnosis Date Noted  . CAP (community acquired pneumonia) 06/30/2017  . Osteoarthritis 07/21/2011  . Altered mental status 07/21/2011  . COPD (chronic obstructive pulmonary disease) ([place]) 07/21/2011  . UTI (lower urinary tract infection) 07/21/2011  . TOTAL HIP FOLLOW-UP 04/14/2008  . HIP PAIN 03/17/2008  . SCIATICA 03/17/2008  . CUBITAL TUNNEL SYNDROME 02/10/2008  . KNEE, ARTHRITIS, DEGEN./OSTEO 02/10/2008  . KNEE PAIN 02/10/2008  . NECK PAIN 12/24/2007  . SHOULDER PAIN 11/17/2007  . RUPTURE ROTATOR CUFF 11/17/2007  . DIABETES 05/13/2007    Past Surgical History:  Procedure Laterality Date  . HIP SURGERY      OB History    No data available       Home Medications    Prior to Admission medications   Medication Sig Start Date End Date Taking? Authorizing Provider  acetaminophen  (TYLENOL) 500 MG tablet Take 500 mg by mouth every 6 (six) hours as needed for mild pain or moderate pain.    [provider]  acyclovir (ZOVIRAX) 400 MG tablet Take 2 tablets (800 mg total) by mouth 5 (five) times daily. 04/06/15   Rolland Porter, MD  ALPRAZolam Duanne Moron) 0.5 MG tablet Take 0.5 mg by mouth at bedtime as needed. Anxiety     [provider]  amLODipine (NORVASC) 5 MG tablet Take 5 mg by mouth daily.      [provider]  brimonidine (ALPHAGAN P) 0.1 % SOLN Place 1 drop into both eyes 2 (two) times daily.      [provider]  cephALEXin (KEFLEX) 500 MG capsule Take 1 capsule (500 mg total) by mouth every 6 (six) hours. 04/05/15   Dorie Rank, MD  dorzolamide (TRUSOPT) 2 % ophthalmic solution Place 1 drop into both eyes 2 (two) times daily.      [provider]  glipiZIDE (GLUCOTROL) 5 MG tablet Take 5 mg by mouth daily.      [provider]  ipratropium-albuterol (DUONEB) 0.5-2.5 (3) MG/3ML SOLN Take 3 mLs by nebulization every 4 (four) hours as needed. Shortness of Breath    [provider]  latanoprost (XALATAN) 0.005 % ophthalmic solution Place 1 drop into both eyes at bedtime.      [provider]  omeprazole (PRILOSEC) 20 MG  capsule Take 20 mg by mouth daily.      [provider]  oxyCODONE-acetaminophen (PERCOCET/ROXICET) 5-325 MG per tablet Take 1 tablet by mouth every 6 (six) hours as needed for severe pain. 04/06/15   Rolland Porter, MD  pravastatin (PRAVACHOL) 40 MG tablet Take 40 mg by mouth daily.      [provider]  predniSONE (DELTASONE) 10 MG tablet Take 3 po QD x 2d then 2 po QD x 3d then 1 po QD x 3d 04/06/15   Rolland Porter, MD  traMADol (ULTRAM) 50 MG tablet Take 1 tablet (50 mg total) by mouth every 6 (six) hours as needed for moderate pain. 04/05/15   Dorie Rank, MD    Family History No family history on file.  Social History Social History   Tobacco Use  . Smoking status: Never Smoker    . Smokeless tobacco: Never Used  Substance Use Topics  . Alcohol use: No  . Drug use: No     Allergies   Hydromorphone hcl   Review of Systems Review of Systems  Constitutional: Positive for fever. Negative for appetite change and fatigue.  HENT: Negative for congestion, ear discharge and sinus pressure.   Eyes: Negative for discharge.  Respiratory: Positive for cough.   Cardiovascular: Negative for chest pain.  Gastrointestinal: Negative for abdominal pain and diarrhea.  Genitourinary: Negative for frequency and hematuria.  Musculoskeletal: Negative for back pain.  Skin: Negative for rash.  Neurological: Negative for seizures and headaches.  Psychiatric/Behavioral: Negative for hallucinations.     Physical Exam Updated Vital Signs BP 118/65   Pulse (!) 105   Temp (!) 100.4 F (38 C) (Oral)   Resp (!) 22   Ht 5\' 3"  (1.6 m)   Wt 78.5 kg (173 lb)   SpO2 92%   BMI 30.65 kg/m   Physical Exam  Constitutional: She is oriented to person, place, and time. She appears well-developed.  HENT:  Head: Normocephalic.  Eyes: Conjunctivae and EOM are normal. No scleral icterus.  Neck: Neck supple. No thyromegaly present.  Cardiovascular: Normal rate and regular rhythm. Exam reveals no gallop and no friction rub.  No murmur heard. Pulmonary/Chest: No stridor. She has wheezes. She has no rales. She exhibits no tenderness.  Abdominal: She exhibits no distension. There is no tenderness. There is no rebound.  Musculoskeletal: Normal range of motion. She exhibits no edema.  Lymphadenopathy:    She has no cervical adenopathy.  Neurological: She is oriented to person, place, and time. She exhibits normal muscle tone. Coordination normal.  Skin: No rash noted. No erythema.  Psychiatric: She has a normal mood and affect. Her behavior is normal.     ED Treatments / Results  Labs (all labs ordered are listed, but only abnormal results are displayed) Labs Reviewed  CBC WITH  DIFFERENTIAL/PLATELET - Abnormal; Notable for the following components:      Result Value   WBC 10.6 (*)    Neutro Abs 8.4 (*)    All other components within normal limits  COMPREHENSIVE METABOLIC PANEL - Abnormal; Notable for the following components:   CO2 21 (*)    Glucose, Bld 256 (*)    BUN 34 (*)    Creatinine, Ser 1.71 (*)    ALT 13 (*)    GFR calc non Af Amer 26 (*)    GFR calc Af Amer 30 (*)    All other components within normal limits    EKG  EKG Interpretation None  Radiology Dg Chest 2 View  Result Date: 06/30/2017 CLINICAL DATA:  Cough congestion and fever EXAM: CHEST  2 VIEW COMPARISON:  07/17/2011 FINDINGS: Streaky opacity at the lingula. Hyperinflation. No pleural effusion. Mild cardiomegaly with aortic atherosclerosis. No pneumothorax. Possible distal resection of right clavicle. IMPRESSION: 1. Streaky opacity at the lingula, may reflect atelectasis scar or infiltrate 2. Mild cardiomegaly Electronically Signed   By: Donavan Foil M.D.   On: 06/30/2017 19:01    Procedures Procedures (including critical care time)  Medications Ordered in ED Medications  ipratropium-albuterol (DUONEB) 0.5-2.5 (3) MG/3ML nebulizer solution 3 mL (3 mLs Nebulization Given 06/30/17 1849)  albuterol (PROVENTIL) (2.5 MG/3ML) 0.083% nebulizer solution 2.5 mg (2.5 mg Nebulization Given 06/30/17 1849)  methylPREDNISolone sodium succinate (SOLU-MEDROL) 125 mg/2 mL injection 125 mg (125 mg Intravenous Given 06/30/17 1843)  acetaminophen (TYLENOL) tablet 650 mg (650 mg Oral Given 06/30/17 2019)  levofloxacin (LEVAQUIN) tablet 500 mg (500 mg Oral Given 06/30/17 2019)     Initial Impression / Assessment and Plan / ED Course  I have reviewed the triage vital signs and the nursing notes.  Pertinent labs & imaging results that were available during my care of the patient were reviewed by me and considered in my medical decision making (see chart for details).     Patient with pneumonia  and hypoxia when she ambulates.  She will be admitted and treated for community-acquired pneumonia  Final Clinical Impressions(s) / ED Diagnoses   Final diagnoses:  Community acquired pneumonia, unspecified laterality    ED Discharge Orders    None       Milton Ferguson, MD 06/30/17 2056

## 2017-06-30 NOTE — ED Notes (Signed)
Sick x 1 week with cough and fever  Coughing up brown expectorant  Dr Earnest Conroy has seen, l;abs  To Rad

## 2017-06-30 NOTE — ED Notes (Signed)
Call for report - Call for Renee, RN  "She will have to call you right back"

## 2017-06-30 NOTE — ED Triage Notes (Signed)
Reports of productive cough x1 week with fever starting today.

## 2017-06-30 NOTE — ED Notes (Signed)
Report to Karen, RN

## 2017-06-30 NOTE — ED Notes (Signed)
Pt reports she has had no flu shot this year   Was going to do it on this coming Thursday when she had an appt with Dr Luan Pulling

## 2017-07-01 ENCOUNTER — Other Ambulatory Visit: Payer: Self-pay

## 2017-07-01 LAB — RETICULOCYTES
RBC.: 3.93 MIL/uL (ref 3.87–5.11)
RETIC CT PCT: 0.8 % (ref 0.4–3.1)
Retic Count, Absolute: 31.4 10*3/uL (ref 19.0–186.0)

## 2017-07-01 LAB — FERRITIN: Ferritin: 157 ng/mL (ref 11–307)

## 2017-07-01 LAB — GLUCOSE, CAPILLARY
GLUCOSE-CAPILLARY: 296 mg/dL — AB (ref 65–99)
GLUCOSE-CAPILLARY: 288 mg/dL — AB (ref 65–99)
GLUCOSE-CAPILLARY: 218 mg/dL — AB (ref 65–99)
Glucose-Capillary: 326 mg/dL — ABNORMAL HIGH (ref 65–99)

## 2017-07-01 LAB — CBC
HCT: 36.2 % (ref 36.0–46.0)
HEMOGLOBIN: 12.3 g/dL (ref 12.0–15.0)
MCH: 31.3 pg (ref 26.0–34.0)
MCHC: 34 g/dL (ref 30.0–36.0)
MCV: 92.1 fL (ref 78.0–100.0)
Platelets: 211 10*3/uL (ref 150–400)
RBC: 3.93 MIL/uL (ref 3.87–5.11)
RDW: 13.3 % (ref 11.5–15.5)
WBC: 8.7 10*3/uL (ref 4.0–10.5)

## 2017-07-01 LAB — COMPREHENSIVE METABOLIC PANEL
ALK PHOS: 61 U/L (ref 38–126)
ALT: 11 U/L — AB (ref 14–54)
AST: 20 U/L (ref 15–41)
Albumin: 3.3 g/dL — ABNORMAL LOW (ref 3.5–5.0)
Anion gap: 12 (ref 5–15)
BUN: 36 mg/dL — ABNORMAL HIGH (ref 6–20)
CALCIUM: 8.3 mg/dL — AB (ref 8.9–10.3)
CO2: 20 mmol/L — ABNORMAL LOW (ref 22–32)
CREATININE: 2.01 mg/dL — AB (ref 0.44–1.00)
Chloride: 98 mmol/L — ABNORMAL LOW (ref 101–111)
GFR, EST AFRICAN AMERICAN: 25 mL/min — AB (ref >60)
GFR, EST NON AFRICAN AMERICAN: 21 mL/min — AB (ref >60)
Glucose, Bld: 381 mg/dL — ABNORMAL HIGH (ref 65–99)
Potassium: 4.2 mmol/L (ref 3.5–5.1)
Sodium: 130 mmol/L — ABNORMAL LOW (ref 135–145)
Total Bilirubin: 0.8 mg/dL (ref 0.3–1.2)
Total Protein: 6.5 g/dL (ref 6.5–8.1)

## 2017-07-01 LAB — IRON AND TIBC
Iron: 18 ug/dL — ABNORMAL LOW (ref 28–170)
Saturation Ratios: 7 % — ABNORMAL LOW (ref 10.4–31.8)
TIBC: 245 ug/dL — AB (ref 250–450)
UIBC: 227 ug/dL

## 2017-07-01 LAB — EXPECTORATED SPUTUM ASSESSMENT W GRAM STAIN, RFLX TO RESP C

## 2017-07-01 LAB — FOLATE: Folate: 21 ng/mL (ref >5.9)

## 2017-07-01 LAB — VITAMIN B12: VITAMIN B 12: 201 pg/mL (ref 180–914)

## 2017-07-01 LAB — HEMOGLOBIN A1C
Hgb A1c MFr Bld: 8 % — ABNORMAL HIGH (ref 4.8–5.6)
Mean Plasma Glucose: 182.9 mg/dL

## 2017-07-01 LAB — PHOSPHORUS: PHOSPHORUS: 2.9 mg/dL (ref 2.5–4.6)

## 2017-07-01 LAB — MAGNESIUM: MAGNESIUM: 1.4 mg/dL — AB (ref 1.7–2.4)

## 2017-07-01 MED ORDER — INSULIN ASPART 100 UNIT/ML ~~LOC~~ SOLN
0.0000 [IU] | Freq: Every day | SUBCUTANEOUS | Status: DC
  Administered 2017-07-01: 2 [IU] via SUBCUTANEOUS
  Administered 2017-07-02 – 2017-07-03 (×2): 5 [IU] via SUBCUTANEOUS
  Administered 2017-07-04: 3 [IU] via SUBCUTANEOUS

## 2017-07-01 MED ORDER — INSULIN ASPART 100 UNIT/ML ~~LOC~~ SOLN
0.0000 [IU] | Freq: Three times a day (TID) | SUBCUTANEOUS | Status: DC
  Administered 2017-07-01: 5 [IU] via SUBCUTANEOUS
  Administered 2017-07-01: 8 [IU] via SUBCUTANEOUS
  Administered 2017-07-02 (×2): 5 [IU] via SUBCUTANEOUS
  Administered 2017-07-02: 3 [IU] via SUBCUTANEOUS
  Administered 2017-07-03: 5 [IU] via SUBCUTANEOUS
  Administered 2017-07-03: 11 [IU] via SUBCUTANEOUS
  Administered 2017-07-03: 3 [IU] via SUBCUTANEOUS
  Administered 2017-07-04: 5 [IU] via SUBCUTANEOUS
  Administered 2017-07-05: 3 [IU] via SUBCUTANEOUS

## 2017-07-01 MED ORDER — HYDROCORTISONE 1 % EX CREA
TOPICAL_CREAM | Freq: Once | CUTANEOUS | Status: DC
  Filled 2017-07-01: qty 28.35

## 2017-07-01 MED ORDER — SODIUM CHLORIDE 0.45 % IV SOLN
INTRAVENOUS | Status: DC
  Administered 2017-07-01 (×2): via INTRAVENOUS

## 2017-07-01 NOTE — Progress Notes (Signed)
Inpatient Diabetes Program Recommendations  AACE/ADA: New Consensus Statement on Inpatient Glycemic Control (2015)  Target Ranges:  Prepandial:   less than 140 mg/dL      Peak postprandial:   less than 180 mg/dL (1-2 hours)      Critically ill patients:  140 - 180 mg/dL   Results for ANANYA, MCCLEESE (MRN [id number]) as of 07/01/2017 08:28  Ref. Range 06/30/2017 22:18 07/01/2017 07:30  Glucose-Capillary Latest Ref Range: 65 - 99 mg/dL 336 (H) 326 (H)   Review of Glycemic Control  Diabetes history: DM2 Outpatient Diabetes medications: Glipizide 5 mg BID Current orders for Inpatient glycemic control: Novolog 0-15 units TID with meals, Glipizide 5 mg BID  Inpatient Diabetes Program Recommendations: Correction (SSI): Please consider ordering Novolog 0-5 units QHS for bedtime correction. HgbA1C: Please consider ordering an A1C to evaluate glycemic control over the past 2-3 months.  NOTE: Noted patient received a one time dose of Solumedrol 125 mg on 06/30/17 which is contributing to hyperglycemia.  Thanks, Barnie Alderman, RN, MSN, CDE Diabetes Coordinator Inpatient Diabetes Program [phone] (Team Pager from 8am to 5pm)

## 2017-07-01 NOTE — Progress Notes (Signed)
Patients family brought eye drops from home that her eye doctor prescribed. Did not give mar medication eye drops patient refused.

## 2017-07-01 NOTE — Progress Notes (Signed)
Subjective: She was admitted with pneumonia.  Chest x-ray which I personally reviewed is not with definite pneumonia but her clinical situation is highly suggestive.  She says she feels a little better.  She is coughing mostly nonproductively but occasionally brings up some sputum.  Her blood sugar is not controlled on current treatment.  Objective: Vital signs in last 24 hours: Temp:  [97.5 F (36.4 C)-100.4 F (38 C)] 97.5 F (36.4 C) (11/05 0700) Pulse Rate:  [73-110] 73 (11/05 0700) Resp:  [20-22] 20 (11/05 0700) BP: (101-120)/(65-82) 117/67 (11/05 0700) SpO2:  [92 %-98 %] 94 % (11/05 0750) Weight:  [78.5 kg (173 lb)-81.4 kg (179 lb 8 oz)] 81.4 kg (179 lb 8 oz) (11/04 2200) Weight change:     Intake/Output from previous day: 11/04 0701 - 11/05 0700 In: 174.3 [P.O.:120; I.V.:54.3] Out: 25 [Urine:25]  PHYSICAL EXAM General appearance: alert, cooperative and mild distress Resp: rhonchi bilaterally and wheezes bilaterally Cardio: regular rate and rhythm, S1, S2 normal, no murmur, click, rub or gallop GI: soft, non-tender; bowel sounds normal; no masses,  no organomegaly Extremities: extremities normal, atraumatic, no cyanosis or edema Skin turgor fair.  She is legally blind  Lab Results:  Results for orders placed or performed during the hospital encounter of 06/30/17 (from the past 48 hour(s))  CBC with Differential/Platelet     Status: Abnormal   Collection Time: 06/30/17  6:28 PM  Result Value Ref Range   WBC 10.6 (H) 4.0 - 10.5 K/uL   RBC 4.36 3.87 - 5.11 MIL/uL   Hemoglobin 14.0 12.0 - 15.0 g/dL   HCT 40.0 36.0 - 46.0 %   MCV 91.7 78.0 - 100.0 fL   MCH 32.1 26.0 - 34.0 pg   MCHC 35.0 30.0 - 36.0 g/dL   RDW 13.4 11.5 - 15.5 %   Platelets 219 150 - 400 K/uL   Neutrophils Relative % 80 %   Neutro Abs 8.4 (H) 1.7 - 7.7 K/uL   Lymphocytes Relative 12 %   Lymphs Abs 1.2 0.7 - 4.0 K/uL   Monocytes Relative 8 %   Monocytes Absolute 0.9 0.1 - 1.0 K/uL   Eosinophils  Relative 0 %   Eosinophils Absolute 0.0 0.0 - 0.7 K/uL   Basophils Relative 0 %   Basophils Absolute 0.0 0.0 - 0.1 K/uL  Comprehensive metabolic panel     Status: Abnormal   Collection Time: 06/30/17  6:28 PM  Result Value Ref Range   Sodium 139 135 - 145 mmol/L   Potassium 4.0 3.5 - 5.1 mmol/L   Chloride 106 101 - 111 mmol/L   CO2 21 (L) 22 - 32 mmol/L   Glucose, Bld 256 (H) 65 - 99 mg/dL   BUN 34 (H) 6 - 20 mg/dL   Creatinine, Ser 1.71 (H) 0.44 - 1.00 mg/dL   Calcium 9.1 8.9 - 10.3 mg/dL   Total Protein 7.1 6.5 - 8.1 g/dL   Albumin 3.9 3.5 - 5.0 g/dL   AST 24 15 - 41 U/L   ALT 13 (L) 14 - 54 U/L   Alkaline Phosphatase 69 38 - 126 U/L   Total Bilirubin 0.7 0.3 - 1.2 mg/dL   GFR calc non Af Amer 26 (L) >60 mL/min   GFR calc Af Amer 30 (L) >60 mL/min    Comment: (NOTE) The eGFR has been calculated using the CKD EPI equation. This calculation has not been validated in all clinical situations. eGFR's persistently <60 mL/min signify possible Chronic Kidney Disease.      Anion gap 12 5 - 15  Glucose, capillary     Status: Abnormal   Collection Time: 06/30/17 10:18 PM  Result Value Ref Range   Glucose-Capillary 336 (H) 65 - 99 mg/dL  Reticulocytes     Status: None   Collection Time: 07/01/17  4:33 AM  Result Value Ref Range   Retic Ct Pct 0.8 0.4 - 3.1 %   RBC. 3.93 3.87 - 5.11 MIL/uL   Retic Count, Absolute 31.4 19.0 - 186.0 K/uL  CBC     Status: None   Collection Time: 07/01/17  4:33 AM  Result Value Ref Range   WBC 8.7 4.0 - 10.5 K/uL   RBC 3.93 3.87 - 5.11 MIL/uL   Hemoglobin 12.3 12.0 - 15.0 g/dL   HCT 36.2 36.0 - 46.0 %   MCV 92.1 78.0 - 100.0 fL   MCH 31.3 26.0 - 34.0 pg   MCHC 34.0 30.0 - 36.0 g/dL   RDW 13.3 11.5 - 15.5 %   Platelets 211 150 - 400 K/uL  Comprehensive metabolic panel     Status: Abnormal   Collection Time: 07/01/17  4:33 AM  Result Value Ref Range   Sodium 130 (L) 135 - 145 mmol/L    Comment: DELTA CHECK NOTED   Potassium 4.2 3.5 - 5.1 mmol/L    Chloride 98 (L) 101 - 111 mmol/L   CO2 20 (L) 22 - 32 mmol/L   Glucose, Bld 381 (H) 65 - 99 mg/dL   BUN 36 (H) 6 - 20 mg/dL   Creatinine, Ser 2.01 (H) 0.44 - 1.00 mg/dL   Calcium 8.3 (L) 8.9 - 10.3 mg/dL   Total Protein 6.5 6.5 - 8.1 g/dL   Albumin 3.3 (L) 3.5 - 5.0 g/dL   AST 20 15 - 41 U/L   ALT 11 (L) 14 - 54 U/L   Alkaline Phosphatase 61 38 - 126 U/L   Total Bilirubin 0.8 0.3 - 1.2 mg/dL   GFR calc non Af Amer 21 (L) >60 mL/min   GFR calc Af Amer 25 (L) >60 mL/min    Comment: (NOTE) The eGFR has been calculated using the CKD EPI equation. This calculation has not been validated in all clinical situations. eGFR's persistently <60 mL/min signify possible Chronic Kidney Disease.    Anion gap 12 5 - 15  Magnesium     Status: Abnormal   Collection Time: 07/01/17  4:33 AM  Result Value Ref Range   Magnesium 1.4 (L) 1.7 - 2.4 mg/dL  Phosphorus     Status: None   Collection Time: 07/01/17  4:33 AM  Result Value Ref Range   Phosphorus 2.9 2.5 - 4.6 mg/dL  Glucose, capillary     Status: Abnormal   Collection Time: 07/01/17  7:30 AM  Result Value Ref Range   Glucose-Capillary 326 (H) 65 - 99 mg/dL   Comment 1 Notify RN    Comment 2 Document in Chart     ABGS No results for input(s): PHART, PO2ART, TCO2, HCO3 in the last 72 hours.  Invalid input(s): PCO2 CULTURES No results found for this or any previous visit (from the past 240 hour(s)). Studies/Results: Dg Chest 2 View  Result Date: 06/30/2017 CLINICAL DATA:  Cough congestion and fever EXAM: CHEST  2 VIEW COMPARISON:  07/17/2011 FINDINGS: Streaky opacity at the lingula. Hyperinflation. No pleural effusion. Mild cardiomegaly with aortic atherosclerosis. No pneumothorax. Possible distal resection of right clavicle. IMPRESSION: 1. Streaky opacity at the lingula, may reflect atelectasis scar or infiltrate  2. Mild cardiomegaly Electronically Signed   By: Kim  Fujinaga M.D.   On: 06/30/2017 19:01    Medications:  Prior to  Admission:  Medications Prior to Admission  Medication Sig Dispense Refill Last Dose  . busPIRone (BUSPAR) 5 MG tablet Take 5 mg 2 (two) times daily as needed by mouth.     . cholecalciferol (VITAMIN D) 1000 units tablet Take 1,000 Units daily by mouth.     . furosemide (LASIX) 20 MG tablet Take 20 mg daily as needed by mouth for fluid.     . acetaminophen (TYLENOL) 500 MG tablet Take 500 mg by mouth every 6 (six) hours as needed for mild pain or moderate pain.   04/05/2015 at Unknown time  . amLODipine (NORVASC) 5 MG tablet Take 5 mg by mouth daily.     Past Week  . brimonidine (ALPHAGAN P) 0.1 % SOLN Place 1 drop into both eyes 2 (two) times daily.     Past Week  . dorzolamide (TRUSOPT) 2 % ophthalmic solution Place 1 drop into both eyes 2 (two) times daily.     Past Week  . glipiZIDE (GLUCOTROL) 5 MG tablet Take 5 mg 2 (two) times daily by mouth.    Past Week  . ipratropium-albuterol (DUONEB) 0.5-2.5 (3) MG/3ML SOLN Take 3 mLs by nebulization every 4 (four) hours as needed. Shortness of Breath   07/16/2011  . latanoprost (XALATAN) 0.005 % ophthalmic solution Place 1 drop into both eyes at bedtime.     Past Week  . omeprazole (PRILOSEC) 20 MG capsule Take 20 mg by mouth daily.     Past Week  . oxyCODONE-acetaminophen (PERCOCET/ROXICET) 5-325 MG per tablet Take 1 tablet by mouth every 6 (six) hours as needed for severe pain. 20 tablet 0   . pravastatin (PRAVACHOL) 40 MG tablet Take 40 mg by mouth daily.     Past Week  . traMADol (ULTRAM) 50 MG tablet Take 1 tablet (50 mg total) by mouth every 6 (six) hours as needed for moderate pain. 30 tablet 0    Scheduled: . brimonidine  1 drop Both Eyes BID  . dorzolamide  1 drop Both Eyes BID  . enoxaparin (LOVENOX) injection  30 mg Subcutaneous Q24H  . glipiZIDE  5 mg Oral BID  . Influenza vac split quadrivalent PF  0.5 mL Intramuscular Tomorrow-1000  . insulin aspart  0-15 Units Subcutaneous TID WC  . ipratropium-albuterol  3 mL Nebulization Q6H  .  latanoprost  1 drop Both Eyes QHS  . [START ON 07/02/2017] levofloxacin  500 mg Oral Q48H  . pantoprazole  40 mg Oral Daily   Continuous: . sodium chloride 88 mL/hr at 07/01/17 0223   PRN:acetaminophen, albuterol, benzonatate, busPIRone, furosemide  Assesment: She was admitted with community-acquired pneumonia.  At baseline she has COPD.  She feels a little better but she is not ready to go home.  Her blood sugar is not controlled on current treatments. Principal Problem:   CAP (community acquired pneumonia) Active Problems:   Type 2 diabetes mellitus ([facility])   COPD (chronic obstructive pulmonary disease) ([facility])   Hypercholesteremia   Glaucoma   Anxiety   Acid reflux    Plan: Continue current medications increase insulin    LOS: 1 day   , L 07/01/2017, 8:40 AM  

## 2017-07-01 NOTE — Progress Notes (Signed)
Initial Nutrition Assessment  DOCUMENTATION CODES:   Obesity unspecified  INTERVENTION:  Provide snack qhs from nourishment room  Nutrition services will obtain meal preferences daily    NUTRITION DIAGNOSIS:   Increased nutrient needs related to chronic illness(COPD) as evidenced by estimated needs.  GOAL:   Patient will meet greater than or equal to 90% of their needs   MONITOR:   PO intake, Skin, Weight trends, Labs  REASON FOR ASSESSMENT:   Consult COPD Protocol  ASSESSMENT:  The patient is an 83 yo who presnets community aquired pneumonia. Hx of COPD. A respiratory and sputum cultures obtained. Hx of DM, depression and GERD. Home diet is regular. She eats 2 main meals daily late breakfast and dinner around 4-5 pm. She is eating 75% of meals today. Diet advanced to CHO modified. Able to feed herself.  The patient denies significant changes in weight status. Her weight hx is stable 180 -183 lbs. She does not meet criteria for malnutrition at this  time but is at risk given her chronic compromised respiratory status and habitual eating pattern.  CBG (last 3)  Recent Labs    07/01/17 0730 07/01/17 1106 07/01/17 1614  GLUCAP 326* 296* 288*      Recent Labs  Lab 06/30/17 1828 07/01/17 0433  NA 139 130*  K 4.0 4.2  CL 106 98*  CO2 21* 20*  BUN 34* 36*  CREATININE 1.71* 2.01*  CALCIUM 9.1 8.3*  MG  --  1.4*  PHOS  --  2.9  GLUCOSE 256* 381*  Labs: sodium 130, BUN 36, Cr 2.01 mag 1.4, Glu 381. Her A1C- 8.0%. IRON-18 (28-170 normal range). Ferritin-WNL. Her B-12 was 201 (normal 180-914). Mag 1.4 (normal 1.7-2.4).  Meds: SSI, levaquin, PPI, lasix,   NUTRITION - FOCUSED PHYSICAL EXAM: Mild temporal muscle loss, no edema.   Diet Order:  Diet Carb Modified Fluid consistency: Thin; Room service appropriate? Yes [phone]  EDUCATION NEEDS:   No education needs have been identified at this time  Skin:  Skin Assessment: Reviewed RN Assessment  Last BM:   unknown   Height:   Ht Readings from Last 1 Encounters:  06/30/17 5\' 3"  (1.6 m)    Weight:   Wt Readings from Last 1 Encounters:  06/30/17 179 lb 8 oz (81.4 kg)    Ideal Body Weight:     BMI:  Body mass index is 31.8 kg/m.  Estimated Nutritional Needs:   Kcal:  1620-1782  (20-22 kcal/kg)  Protein:  80-89 gr   Fluid:  >1600 ml daily  Colman Cater MS,RD,CSG,LDN Office: [phone] Pager: [phone]

## 2017-07-02 LAB — GLUCOSE, CAPILLARY
GLUCOSE-CAPILLARY: 166 mg/dL — AB (ref 65–99)
GLUCOSE-CAPILLARY: 206 mg/dL — AB (ref 65–99)
Glucose-Capillary: 128 mg/dL — ABNORMAL HIGH (ref 65–99)
Glucose-Capillary: 212 mg/dL — ABNORMAL HIGH (ref 65–99)
Glucose-Capillary: 371 mg/dL — ABNORMAL HIGH (ref 65–99)

## 2017-07-02 MED ORDER — GUAIFENESIN ER 600 MG PO TB12
1200.0000 mg | ORAL_TABLET | Freq: Two times a day (BID) | ORAL | Status: DC
  Administered 2017-07-02 – 2017-07-05 (×7): 1200 mg via ORAL
  Filled 2017-07-02 (×7): qty 2

## 2017-07-02 MED ORDER — VALACYCLOVIR HCL 500 MG PO TABS
1000.0000 mg | ORAL_TABLET | Freq: Two times a day (BID) | ORAL | Status: DC
  Administered 2017-07-02 – 2017-07-05 (×7): 1000 mg via ORAL
  Filled 2017-07-02 (×7): qty 2

## 2017-07-02 MED ORDER — TRAMADOL HCL 50 MG PO TABS
50.0000 mg | ORAL_TABLET | Freq: Four times a day (QID) | ORAL | Status: DC | PRN
  Administered 2017-07-03 – 2017-07-04 (×2): 50 mg via ORAL
  Filled 2017-07-02 (×2): qty 1

## 2017-07-02 MED ORDER — HYDROCORTISONE 1 % EX CREA
TOPICAL_CREAM | Freq: Two times a day (BID) | CUTANEOUS | Status: DC
  Administered 2017-07-02 – 2017-07-05 (×3): 1 via TOPICAL
  Filled 2017-07-02 (×4): qty 1.5

## 2017-07-02 MED ORDER — SODIUM CHLORIDE 0.45 % IV SOLN
INTRAVENOUS | Status: DC
  Administered 2017-07-02 – 2017-07-04 (×3): via INTRAVENOUS

## 2017-07-02 MED ORDER — PREDNISONE 20 MG PO TABS
40.0000 mg | ORAL_TABLET | Freq: Every day | ORAL | Status: DC
  Administered 2017-07-02 – 2017-07-04 (×3): 40 mg via ORAL
  Filled 2017-07-02 (×3): qty 2

## 2017-07-02 NOTE — Care Management Note (Addendum)
Case Management Note  Patient Details  Name: LIBORIA PUTNAM MRN: [id number] Date of Birth: [birth year]  Subjective/Objective:         Admitted with pneumonia. Pt is from home, lives with her son and is ind with ADL's at baseline with use of RW. She has insurance with drug coverage and PCP. Shemanages her own medications and family transports to appointments. She has no HH or DME needs pta. She is on oxygen acutely. Dtr at bedside for assessment. Pt communicates no needs or concerns about transition home.          Action/Plan: anticipate DC home. Pt encouraged to get out of bed as much as possible to prevent weakness and promote oxygenation. CM will cont to follow.  Pt on [facility] registy, not active, will refer for Emmi transition calls at DC.   Expected Discharge Date:  07/02/17               Expected Discharge Plan:  Home/Self Care  In-House Referral:  NA  Discharge planning Services  CM Consult  Post Acute Care Choice:  NA Choice offered to:  NA  Status of Service:  In process, will continue to follow  Sherald Barge, RN 07/02/2017, 10:34 AM

## 2017-07-02 NOTE — Progress Notes (Signed)
Subjective: She says she feels better.  She is coughing still productively.  She has pain in her left and right side and she has a rash in the left flank.  Objective: Vital signs in last 24 hours: Temp:  [97.5 F (36.4 C)-97.8 F (36.6 C)] 97.8 F (36.6 C) (11/06 0533) Pulse Rate:  [59-90] 59 (11/06 0533) Resp:  [20] 20 (11/06 0533) BP: (123-135)/(67-81) 123/72 (11/06 0533) SpO2:  [88 %-99 %] 88 % (11/06 0733) Weight change:  Last BM Date: 07/01/17  Intake/Output from previous day: 11/05 0701 - 11/06 0700 In: 1304 [P.O.:600; I.V.:704] Out: -   PHYSICAL EXAM General appearance: alert, cooperative and mild distress Resp: rhonchi bilaterally and wheezes bilaterally Cardio: regular rate and rhythm, S1, S2 normal, no murmur, click, rub or gallop GI: soft, non-tender; bowel sounds normal; no masses,  no organomegaly Extremities: extremities normal, atraumatic, no cyanosis or edema She has a rash suggestive of shingles on the left flank  Lab Results:  Results for orders placed or performed during the hospital encounter of 06/30/17 (from the past 48 hour(s))  CBC with Differential/Platelet     Status: Abnormal   Collection Time: 06/30/17  6:28 PM  Result Value Ref Range   WBC 10.6 (H) 4.0 - 10.5 K/uL   RBC 4.36 3.87 - 5.11 MIL/uL   Hemoglobin 14.0 12.0 - 15.0 g/dL   HCT 40.0 36.0 - 46.0 %   MCV 91.7 78.0 - 100.0 fL   MCH 32.1 26.0 - 34.0 pg   MCHC 35.0 30.0 - 36.0 g/dL   RDW 13.4 11.5 - 15.5 %   Platelets 219 150 - 400 K/uL   Neutrophils Relative % 80 %   Neutro Abs 8.4 (H) 1.7 - 7.7 K/uL   Lymphocytes Relative 12 %   Lymphs Abs 1.2 0.7 - 4.0 K/uL   Monocytes Relative 8 %   Monocytes Absolute 0.9 0.1 - 1.0 K/uL   Eosinophils Relative 0 %   Eosinophils Absolute 0.0 0.0 - 0.7 K/uL   Basophils Relative 0 %   Basophils Absolute 0.0 0.0 - 0.1 K/uL  Comprehensive metabolic panel     Status: Abnormal   Collection Time: 06/30/17  6:28 PM  Result Value Ref Range   Sodium 139  135 - 145 mmol/L   Potassium 4.0 3.5 - 5.1 mmol/L   Chloride 106 101 - 111 mmol/L   CO2 21 (L) 22 - 32 mmol/L   Glucose, Bld 256 (H) 65 - 99 mg/dL   BUN 34 (H) 6 - 20 mg/dL   Creatinine, Ser 1.71 (H) 0.44 - 1.00 mg/dL   Calcium 9.1 8.9 - 10.3 mg/dL   Total Protein 7.1 6.5 - 8.1 g/dL   Albumin 3.9 3.5 - 5.0 g/dL   AST 24 15 - 41 U/L   ALT 13 (L) 14 - 54 U/L   Alkaline Phosphatase 69 38 - 126 U/L   Total Bilirubin 0.7 0.3 - 1.2 mg/dL   GFR calc non Af Amer 26 (L) >60 mL/min   GFR calc Af Amer 30 (L) >60 mL/min    Comment: (NOTE) The eGFR has been calculated using the CKD EPI equation. This calculation has not been validated in all clinical situations. eGFR's persistently <60 mL/min signify possible Chronic Kidney Disease.    Anion gap 12 5 - 15  Glucose, capillary     Status: Abnormal   Collection Time: 06/30/17 10:18 PM  Result Value Ref Range   Glucose-Capillary 336 (H) 65 - 99 mg/dL  Vitamin B12     Status: None   Collection Time: 07/01/17  4:33 AM  Result Value Ref Range   Vitamin B-12 201 180 - 914 pg/mL    Comment: (NOTE) This assay is not validated for testing neonatal or myeloproliferative syndrome specimens for Vitamin B12 levels. Performed at [facility], [place] [address] [place], [place] [address]   Folate     Status: None   Collection Time: 07/01/17  4:33 AM  Result Value Ref Range   Folate 21.0 >5.9 ng/mL    Comment: Performed at [facility] [address], [place], [place] [address]  Iron and TIBC     Status: Abnormal   Collection Time: 07/01/17  4:33 AM  Result Value Ref Range   Iron 18 (L) 28 - 170 ug/dL   TIBC 245 (L) 250 - 450 ug/dL   Saturation Ratios 7 (L) 10.4 - 31.8 %   UIBC 227 ug/dL    Comment: Performed at [facility], [place] [address] [place], [place] [address]  Ferritin     Status: None   Collection Time: 07/01/17  4:33 AM  Result Value Ref Range   Ferritin 157 11 - 307 ng/mL    Comment: Performed at [facility], [place] [address] [place],  [address]  Reticulocytes     Status: None   Collection Time: 07/01/17  4:33 AM  Result Value Ref Range   Retic Ct Pct 0.8 0.4 - 3.1 %   RBC. 3.93 3.87 - 5.11 MIL/uL   Retic Count, Absolute 31.4 19.0 - 186.0 K/uL  CBC     Status: None   Collection Time: 07/01/17  4:33 AM  Result Value Ref Range   WBC 8.7 4.0 - 10.5 K/uL   RBC 3.93 3.87 - 5.11 MIL/uL   Hemoglobin 12.3 12.0 - 15.0 g/dL   HCT 36.2 36.0 - 46.0 %   MCV 92.1 78.0 - 100.0 fL   MCH 31.3 26.0 - 34.0 pg   MCHC 34.0 30.0 - 36.0 g/dL   RDW 13.3 11.5 - 15.5 %   Platelets 211 150 - 400 K/uL  Comprehensive metabolic panel     Status: Abnormal   Collection Time: 07/01/17  4:33 AM  Result Value Ref Range   Sodium 130 (L) 135 - 145 mmol/L    Comment: DELTA CHECK NOTED   Potassium 4.2 3.5 - 5.1 mmol/L   Chloride 98 (L) 101 - 111 mmol/L   CO2 20 (L) 22 - 32 mmol/L   Glucose, Bld 381 (H) 65 - 99 mg/dL   BUN 36 (H) 6 - 20 mg/dL   Creatinine, Ser 2.01 (H) 0.44 - 1.00 mg/dL   Calcium 8.3 (L) 8.9 - 10.3 mg/dL   Total Protein 6.5 6.5 - 8.1 g/dL   Albumin 3.3 (L) 3.5 - 5.0 g/dL   AST 20 15 - 41 U/L   ALT 11 (L) 14 - 54 U/L   Alkaline Phosphatase 61 38 - 126 U/L   Total Bilirubin 0.8 0.3 - 1.2 mg/dL   GFR calc non Af Amer 21 (L) >60 mL/min   GFR calc Af Amer 25 (L) >60 mL/min    Comment: (NOTE) The eGFR has been calculated using the CKD EPI equation. This calculation has not been validated in all clinical situations. eGFR's persistently <60 mL/min signify possible Chronic Kidney Disease.    Anion gap 12 5 - 15  Magnesium     Status: Abnormal  Collection Time: 07/01/17  4:33 AM  Result Value Ref Range   Magnesium 1.4 (L) 1.7 - 2.4 mg/dL  Phosphorus     Status: None   Collection Time: 07/01/17  4:33 AM  Result Value Ref Range   Phosphorus 2.9 2.5 - 4.6 mg/dL  Hemoglobin A1c     Status: Abnormal   Collection Time: 07/01/17  4:34 AM  Result Value Ref Range   Hgb A1c MFr Bld 8.0 (H)  4.8 - 5.6 %    Comment: (NOTE) Pre diabetes:          5.7%-6.4% Diabetes:              >6.4% Glycemic control for   <7.0% adults with diabetes    Mean Plasma Glucose 182.9 mg/dL    Comment: Performed at [facility] [address], [place], [place] [address]  Glucose, capillary     Status: Abnormal   Collection Time: 07/01/17  7:30 AM  Result Value Ref Range   Glucose-Capillary 326 (H) 65 - 99 mg/dL   Comment 1 Notify RN    Comment 2 Document in Chart   Culture, sputum-assessment     Status: None   Collection Time: 07/01/17  8:09 AM  Result Value Ref Range   Specimen Description SPU    Special Requests Immunocompromised    Sputum evaluation THIS SPECIMEN IS ACCEPTABLE FOR SPUTUM CULTURE    Report Status 07/01/2017 FINAL   Culture, respiratory (NON-Expectorated)     Status: None (Preliminary result)   Collection Time: 07/01/17  8:09 AM  Result Value Ref Range   Specimen Description SPU    Special Requests Immunocompromised Reflexed from [id number]    Gram Stain      ABUNDANT WBC PRESENT,BOTH PMN AND MONONUCLEAR MODERATE GRAM POSITIVE COCCI IN PAIRS FEW GRAM VARIABLE ROD Performed at [facility], [place] [address], [place], [place] [address]    Culture PENDING    Report Status PENDING   Glucose, capillary     Status: Abnormal   Collection Time: 07/01/17 11:06 AM  Result Value Ref Range   Glucose-Capillary 296 (H) 65 - 99 mg/dL   Comment 1 Notify RN    Comment 2 Document in Chart   Glucose, capillary     Status: Abnormal   Collection Time: 07/01/17  4:14 PM  Result Value Ref Range   Glucose-Capillary 288 (H) 65 - 99 mg/dL   Comment 1 Notify RN    Comment 2 Document in Chart   Glucose, capillary     Status: Abnormal   Collection Time: 07/01/17  8:04 PM  Result Value Ref Range   Glucose-Capillary 218 (H) 65 - 99 mg/dL   Comment 1 Notify RN    Comment 2 Document in Chart   Glucose, capillary     Status: Abnormal   Collection Time: 07/02/17  7:47 AM  Result  Value Ref Range   Glucose-Capillary 128 (H) 65 - 99 mg/dL    ABGS No results for input(s): PHART, PO2ART, TCO2, HCO3 in the last 72 hours.  Invalid input(s): PCO2 CULTURES Recent Results (from the past 240 hour(s))  Culture, sputum-assessment     Status: None   Collection Time: 07/01/17  8:09 AM  Result Value Ref Range Status   Specimen Description SPU  Final   Special Requests Immunocompromised  Final   Sputum evaluation THIS SPECIMEN IS ACCEPTABLE FOR SPUTUM CULTURE  Final   Report Status 07/01/2017 FINAL  Final  Culture, respiratory (NON-Expectorated)  Status: None (Preliminary result)   Collection Time: 07/01/17  8:09 AM  Result Value Ref Range Status   Specimen Description SPU  Final   Special Requests Immunocompromised Reflexed from [id number]  Final   Gram Stain   Final    ABUNDANT WBC PRESENT,BOTH PMN AND MONONUCLEAR MODERATE GRAM POSITIVE COCCI IN PAIRS FEW GRAM VARIABLE ROD Performed at [facility], [place] [address], [place], [place] [address]    Culture PENDING  Incomplete   Report Status PENDING  Incomplete   Studies/Results: Dg Chest 2 View  Result Date: 06/30/2017 CLINICAL DATA:  Cough congestion and fever EXAM: CHEST  2 VIEW COMPARISON:  07/17/2011 FINDINGS: Streaky opacity at the lingula. Hyperinflation. No pleural effusion. Mild cardiomegaly with aortic atherosclerosis. No pneumothorax. Possible distal resection of right clavicle. IMPRESSION: 1. Streaky opacity at the lingula, may reflect atelectasis scar or infiltrate 2. Mild cardiomegaly Electronically Signed   By: Donavan Foil M.D.   On: 06/30/2017 19:01    Medications:  Prior to Admission:  Medications Prior to Admission  Medication Sig Dispense Refill Last Dose  . acetaminophen (TYLENOL) 500 MG tablet Take 500 mg by mouth every 6 (six) hours as needed for mild pain or moderate pain.   06/30/2017 at Unknown time  . amLODipine (NORVASC) 5 MG tablet Take 5 mg by mouth daily.     06/30/2017 at Unknown  time  . brimonidine (ALPHAGAN P) 0.1 % SOLN Place 1 drop into both eyes 2 (two) times daily.     06/30/2017 at Unknown time  . busPIRone (BUSPAR) 5 MG tablet Take 5 mg 2 (two) times daily as needed by mouth.   06/30/2017 at Unknown time  . cholecalciferol (VITAMIN D) 1000 units tablet Take 1,000 Units daily by mouth.   06/30/2017 at Unknown time  . dorzolamide (TRUSOPT) 2 % ophthalmic solution Place 1 drop into both eyes 2 (two) times daily.     06/30/2017 at Unknown time  . furosemide (LASIX) 20 MG tablet Take 20 mg daily as needed by mouth for fluid.   06/30/2017 at Unknown time  . glipiZIDE (GLUCOTROL) 5 MG tablet Take 5 mg 2 (two) times daily by mouth.    06/30/2017 at Unknown time  . ipratropium-albuterol (DUONEB) 0.5-2.5 (3) MG/3ML SOLN Take 3 mLs by nebulization every 4 (four) hours as needed. Shortness of Breath   06/30/2017 at Unknown time  . latanoprost (XALATAN) 0.005 % ophthalmic solution Place 1 drop into both eyes at bedtime.     06/30/2017 at Unknown time  . omeprazole (PRILOSEC) 20 MG capsule Take 20 mg by mouth daily.     06/30/2017 at Unknown time  . oxyCODONE-acetaminophen (PERCOCET/ROXICET) 5-325 MG per tablet Take 1 tablet by mouth every 6 (six) hours as needed for severe pain. 20 tablet 0 06/30/2017 at Unknown time  . pravastatin (PRAVACHOL) 40 MG tablet Take 40 mg by mouth daily.     06/30/2017 at Unknown time  . traMADol (ULTRAM) 50 MG tablet Take 1 tablet (50 mg total) by mouth every 6 (six) hours as needed for moderate pain. 30 tablet 0 06/30/2017 at Unknown time   Scheduled: . brimonidine  1 drop Both Eyes BID  . dorzolamide  1 drop Both Eyes BID  . enoxaparin (LOVENOX) injection  30 mg Subcutaneous Q24H  . glipiZIDE  5 mg Oral BID  . guaiFENesin  1,200 mg Oral BID  . hydrocortisone cream   Topical BID  . insulin aspart  0-15 Units Subcutaneous TID WC  .  insulin aspart  0-5 Units Subcutaneous QHS  . ipratropium-albuterol  3 mL Nebulization Q6H  . latanoprost  1 drop Both Eyes  QHS  . levofloxacin  500 mg Oral Q48H  . pantoprazole  40 mg Oral Daily  . predniSONE  40 mg Oral Q breakfast  . valACYclovir  1,000 mg Oral BID   Continuous: . sodium chloride     PNT:[id number], albuterol, benzonatate, busPIRone, furosemide, traMADol  Assesment: She was admitted with community-acquired pneumonia.  She has diabetes and her blood sugars doing better but I think she is going to need some steroids.  At baseline she has COPD and she has significant wheezing now.  She has a rash that I think is shingles. Principal Problem:   CAP (community acquired pneumonia) Active Problems:   Type 2 diabetes mellitus ([place])   COPD (chronic obstructive pulmonary disease) ([facility])   Hypercholesteremia   Glaucoma   Anxiety   Acid reflux    Plan: Add steroids, flutter valve, Mucinex and Valtrex    LOS: 2 days   Danialle Dement L 07/02/2017, 8:34 AM

## 2017-07-03 LAB — GLUCOSE, CAPILLARY
GLUCOSE-CAPILLARY: 218 mg/dL — AB (ref 65–99)
GLUCOSE-CAPILLARY: 158 mg/dL — AB (ref 65–99)
GLUCOSE-CAPILLARY: 352 mg/dL — AB (ref 65–99)
Glucose-Capillary: 315 mg/dL — ABNORMAL HIGH (ref 65–99)

## 2017-07-03 LAB — URINALYSIS, ROUTINE W REFLEX MICROSCOPIC
BILIRUBIN URINE: NEGATIVE
GLUCOSE, UA: 150 mg/dL — AB
HGB URINE DIPSTICK: NEGATIVE
Ketones, ur: NEGATIVE mg/dL
Leukocytes, UA: NEGATIVE
Nitrite: NEGATIVE
Protein, ur: NEGATIVE mg/dL
SPECIFIC GRAVITY, URINE: 1.005 (ref 1.005–1.030)
pH: 5 (ref 5.0–8.0)

## 2017-07-03 LAB — CULTURE, RESPIRATORY: CULTURE: NORMAL

## 2017-07-03 LAB — CULTURE, RESPIRATORY W GRAM STAIN

## 2017-07-03 MED ORDER — IPRATROPIUM-ALBUTEROL 0.5-2.5 (3) MG/3ML IN SOLN
3.0000 mL | Freq: Three times a day (TID) | RESPIRATORY_TRACT | Status: DC
  Administered 2017-07-04: 3 mL via RESPIRATORY_TRACT
  Filled 2017-07-03: qty 3

## 2017-07-03 MED ORDER — INSULIN ASPART 100 UNIT/ML ~~LOC~~ SOLN
3.0000 [IU] | Freq: Three times a day (TID) | SUBCUTANEOUS | Status: DC
  Administered 2017-07-03 – 2017-07-05 (×6): 3 [IU] via SUBCUTANEOUS

## 2017-07-03 NOTE — Progress Notes (Signed)
Subjective: She says she feels better.  She has no new complaints.  Her breathing is a little bit better.  She is coughing up some sputum.  The area in her groin and flank she says is better with the cream.  Objective: Vital signs in last 24 hours: Temp:  [97.6 F (36.4 C)-98 F (36.7 C)] 98 F (36.7 C) (11/07 0558) Pulse Rate:  [86-95] 87 (11/07 0558) Resp:  [18-20] 18 (11/07 0558) BP: (111-118)/(69-74) 111/69 (11/07 0558) SpO2:  [97 %-100 %] 98 % (11/07 0558) Weight change:  Last BM Date: 07/01/17  Intake/Output from previous day: 11/06 0701 - 11/07 0700 In: 1904.2 [P.O.:920; I.V.:984.2] Out: 1300 [Urine:1300]  PHYSICAL EXAM General appearance: alert, cooperative and mild distress Resp: rhonchi bilaterally Cardio: regular rate and rhythm, S1, S2 normal, no murmur, click, rub or gallop GI: soft, non-tender; bowel sounds normal; no masses,  no organomegaly Extremities: extremities normal, atraumatic, no cyanosis or edema The skin lesion that I was concerned was shingles looks better   Lab Results:  Results for orders placed or performed during the hospital encounter of 06/30/17 (from the past 48 hour(s))  Glucose, capillary     Status: Abnormal   Collection Time: 07/01/17 11:06 AM  Result Value Ref Range   Glucose-Capillary 296 (H) 65 - 99 mg/dL   Comment 1 Notify RN    Comment 2 Document in Chart   Glucose, capillary     Status: Abnormal   Collection Time: 07/01/17  4:14 PM  Result Value Ref Range   Glucose-Capillary 288 (H) 65 - 99 mg/dL   Comment 1 Notify RN    Comment 2 Document in Chart   Glucose, capillary     Status: Abnormal   Collection Time: 07/01/17  8:04 PM  Result Value Ref Range   Glucose-Capillary 218 (H) 65 - 99 mg/dL   Comment 1 Notify RN    Comment 2 Document in Chart   Glucose, capillary     Status: Abnormal   Collection Time: 07/02/17  7:47 AM  Result Value Ref Range   Glucose-Capillary 128 (H) 65 - 99 mg/dL  Glucose, capillary     Status:  Abnormal   Collection Time: 07/02/17  9:33 AM  Result Value Ref Range   Glucose-Capillary 166 (H) 65 - 99 mg/dL  Glucose, capillary     Status: Abnormal   Collection Time: 07/02/17 11:10 AM  Result Value Ref Range   Glucose-Capillary 212 (H) 65 - 99 mg/dL   Comment 1 Notify RN    Comment 2 Document in Chart   Glucose, capillary     Status: Abnormal   Collection Time: 07/02/17  4:47 PM  Result Value Ref Range   Glucose-Capillary 206 (H) 65 - 99 mg/dL   Comment 1 Notify RN    Comment 2 Document in Chart   Glucose, capillary     Status: Abnormal   Collection Time: 07/02/17  9:21 PM  Result Value Ref Range   Glucose-Capillary 371 (H) 65 - 99 mg/dL   Comment 1 Notify RN    Comment 2 Document in Chart   Urinalysis, Routine w reflex microscopic     Status: Abnormal   Collection Time: 07/03/17  2:00 AM  Result Value Ref Range   Color, Urine STRAW (A) YELLOW   APPearance CLEAR CLEAR   Specific Gravity, Urine 1.005 1.005 - 1.030   pH 5.0 5.0 - 8.0   Glucose, UA 150 (A) NEGATIVE mg/dL   Hgb urine dipstick NEGATIVE NEGATIVE  Bilirubin Urine NEGATIVE NEGATIVE   Ketones, ur NEGATIVE NEGATIVE mg/dL   Protein, ur NEGATIVE NEGATIVE mg/dL   Nitrite NEGATIVE NEGATIVE   Leukocytes, UA NEGATIVE NEGATIVE  Glucose, capillary     Status: Abnormal   Collection Time: 07/03/17  7:42 AM  Result Value Ref Range   Glucose-Capillary 218 (H) 65 - 99 mg/dL   Comment 1 Notify RN    Comment 2 Document in Chart     ABGS No results for input(s): PHART, PO2ART, TCO2, HCO3 in the last 72 hours.  Invalid input(s): PCO2 CULTURES Recent Results (from the past 240 hour(s))  Culture, sputum-assessment     Status: None   Collection Time: 07/01/17  8:09 AM  Result Value Ref Range Status   Specimen Description SPU  Final   Special Requests Immunocompromised  Final   Sputum evaluation THIS SPECIMEN IS ACCEPTABLE FOR SPUTUM CULTURE  Final   Report Status 07/01/2017 FINAL  Final  Culture, respiratory  (NON-Expectorated)     Status: None (Preliminary result)   Collection Time: 07/01/17  8:09 AM  Result Value Ref Range Status   Specimen Description SPU  Final   Special Requests Immunocompromised Reflexed from [id number]  Final   Gram Stain   Final    ABUNDANT WBC PRESENT,BOTH PMN AND MONONUCLEAR MODERATE GRAM POSITIVE COCCI IN PAIRS FEW GRAM VARIABLE ROD    Culture   Final    CULTURE REINCUBATED FOR BETTER GROWTH Performed at [facility], [place] [address], [place], [place] [address]    Report Status PENDING  Incomplete   Studies/Results: No results found.  Medications:  Prior to Admission:  Medications Prior to Admission  Medication Sig Dispense Refill Last Dose  . acetaminophen (TYLENOL) 500 MG tablet Take 500 mg by mouth every 6 (six) hours as needed for mild pain or moderate pain.   06/30/2017 at Unknown time  . amLODipine (NORVASC) 5 MG tablet Take 5 mg by mouth daily.     06/30/2017 at Unknown time  . brimonidine (ALPHAGAN P) 0.1 % SOLN Place 1 drop into both eyes 2 (two) times daily.     06/30/2017 at Unknown time  . busPIRone (BUSPAR) 5 MG tablet Take 5 mg 2 (two) times daily as needed by mouth.   06/30/2017 at Unknown time  . cholecalciferol (VITAMIN D) 1000 units tablet Take 1,000 Units daily by mouth.   06/30/2017 at Unknown time  . dorzolamide (TRUSOPT) 2 % ophthalmic solution Place 1 drop into both eyes 2 (two) times daily.     06/30/2017 at Unknown time  . furosemide (LASIX) 20 MG tablet Take 20 mg daily as needed by mouth for fluid.   06/30/2017 at Unknown time  . glipiZIDE (GLUCOTROL) 5 MG tablet Take 5 mg 2 (two) times daily by mouth.    06/30/2017 at Unknown time  . ipratropium-albuterol (DUONEB) 0.5-2.5 (3) MG/3ML SOLN Take 3 mLs by nebulization every 4 (four) hours as needed. Shortness of Breath   06/30/2017 at Unknown time  . latanoprost (XALATAN) 0.005 % ophthalmic solution Place 1 drop into both eyes at bedtime.     06/30/2017 at Unknown time  . omeprazole (PRILOSEC) 20  MG capsule Take 20 mg by mouth daily.     06/30/2017 at Unknown time  . oxyCODONE-acetaminophen (PERCOCET/ROXICET) 5-325 MG per tablet Take 1 tablet by mouth every 6 (six) hours as needed for severe pain. 20 tablet 0 06/30/2017 at Unknown time  . pravastatin (PRAVACHOL) 40 MG tablet Take 40 mg by mouth  daily.     06/30/2017 at Unknown time  . traMADol (ULTRAM) 50 MG tablet Take 1 tablet (50 mg total) by mouth every 6 (six) hours as needed for moderate pain. 30 tablet 0 06/30/2017 at Unknown time   Scheduled: . dorzolamide  1 drop Both Eyes BID  . enoxaparin (LOVENOX) injection  30 mg Subcutaneous Q24H  . glipiZIDE  5 mg Oral BID  . guaiFENesin  1,200 mg Oral BID  . hydrocortisone cream   Topical BID  . insulin aspart  0-15 Units Subcutaneous TID WC  . insulin aspart  0-5 Units Subcutaneous QHS  . insulin aspart  3 Units Subcutaneous TID WC  . ipratropium-albuterol  3 mL Nebulization Q6H  . levofloxacin  500 mg Oral Q48H  . pantoprazole  40 mg Oral Daily  . predniSONE  40 mg Oral Q breakfast  . valACYclovir  1,000 mg Oral BID   Continuous: . sodium chloride 50 mL/hr at 07/02/17 2118   VVO:[id number], albuterol, benzonatate, busPIRone, furosemide, traMADol  Assesment: She was admitted with community-acquired pneumonia.  At baseline she has COPD.  She has acute hypoxic respiratory failure.  She has diabetes and her blood sugar is not totally controlled.  She had a rash on her flank that I was concerned was shingles but it looks so much better today it may not be. Principal Problem:   CAP (community acquired pneumonia) Active Problems:   Type 2 diabetes mellitus ([facility])   COPD (chronic obstructive pulmonary disease) ([facility])   Hypercholesteremia   Glaucoma   Anxiety   Acid reflux    Plan: Continue treatments.  Add mealtime NovoLog.  No other changes.  Continue treatment for shingles although I am not sure that is the correct diagnosis.    LOS: 3 days   Manjot Beumer L 07/03/2017,  8:33 AM

## 2017-07-03 NOTE — Progress Notes (Signed)
Inpatient Diabetes Program Recommendations  AACE/ADA: New Consensus Statement on Inpatient Glycemic Control (2015)  Target Ranges:  Prepandial:              less than 140 mg/dL                            Peak postprandial:    less than 180 mg/dL (1-2 hours)                            Critically ill patients:  140 - 180 mg/dL   Results for VIVAN, VANDERVEER (MRN [id number]) as of 07/03/2017 07:46  Ref. Range 07/02/2017 07:47 07/02/2017 09:33 07/02/2017 11:10 07/02/2017 16:47 07/02/2017 21:21 07/03/2017 07:42  Glucose-Capillary Latest Ref Range: 65 - 99 mg/dL 128 (H) 166 (H) 212 (H) 206 (H) 371 (H) 218 (H)   Review of Glycemic Control  Diabetes history: DM2 Outpatient Diabetes medications: Glipizide 5 mg BID Current orders for Inpatient glycemic control: Novolog 0-15 units TID with meals, Novolog 0-5 units QHS, Glipizide 5 mg BID  Inpatient Diabetes Program Recommendations: Insulin-Meal Coverage: Please consider ordering Novolog 3 units TID with meals for meal coverage if patient eats at least 50% of meal.  Thanks, Barnie Alderman, RN, MSN, CDE Diabetes Coordinator Inpatient Diabetes Program [phone] (Team Pager from 8am to 5pm)

## 2017-07-04 LAB — GLUCOSE, CAPILLARY
Glucose-Capillary: 76 mg/dL (ref 65–99)
Glucose-Capillary: 90 mg/dL (ref 65–99)
Glucose-Capillary: 220 mg/dL — ABNORMAL HIGH (ref 65–99)
Glucose-Capillary: 300 mg/dL — ABNORMAL HIGH (ref 65–99)

## 2017-07-04 MED ORDER — METHYLPREDNISOLONE SODIUM SUCC 40 MG IJ SOLR
40.0000 mg | Freq: Two times a day (BID) | INTRAMUSCULAR | Status: DC
  Administered 2017-07-04 – 2017-07-05 (×3): 40 mg via INTRAVENOUS
  Filled 2017-07-04 (×3): qty 1

## 2017-07-04 MED ORDER — ACETAMINOPHEN 500 MG PO TABS: 500.0000 mg | ORAL_TABLET | Freq: Four times a day (QID) | ORAL | Status: DC | PRN

## 2017-07-04 MED ORDER — IPRATROPIUM-ALBUTEROL 0.5-2.5 (3) MG/3ML IN SOLN
3.0000 mL | Freq: Four times a day (QID) | RESPIRATORY_TRACT | Status: DC
  Administered 2017-07-04 – 2017-07-05 (×2): 3 mL via RESPIRATORY_TRACT
  Filled 2017-07-04 (×2): qty 3

## 2017-07-04 NOTE — Progress Notes (Signed)
Subjective: She says she feels better but she is still coughing and wheezing.  No other new complaints.  She is coughing up sputum but it is less dark than it was.  Objective: Vital signs in last 24 hours: Temp:  [97.5 F (36.4 C)-98 F (36.7 C)] 97.5 F (36.4 C) (11/08 0609) Pulse Rate:  [73-97] 73 (11/08 0609) Resp:  [18-20] 20 (11/08 0609) BP: (121-138)/(73-86) 138/73 (11/08 0609) SpO2:  [94 %-100 %] 97 % (11/08 0752) Weight change:  Last BM Date: 07/01/17  Intake/Output from previous day: 11/07 0701 - 11/08 0700 In: 480 [P.O.:480] Out: 1000 [Urine:1000]  PHYSICAL EXAM General appearance: alert, cooperative and mild distress Resp: rhonchi bilaterally and wheezes bilaterally Cardio: regular rate and rhythm, S1, S2 normal, no murmur, click, rub or gallop GI: soft, non-tender; bowel sounds normal; no masses,  no organomegaly Extremities: extremities normal, atraumatic, no cyanosis or edema The skin rash continues to improve  Lab Results:  Results for orders placed or performed during the hospital encounter of 06/30/17 (from the past 48 hour(s))  Glucose, capillary     Status: Abnormal   Collection Time: 07/02/17  9:33 AM  Result Value Ref Range   Glucose-Capillary 166 (H) 65 - 99 mg/dL  Glucose, capillary     Status: Abnormal   Collection Time: 07/02/17 11:10 AM  Result Value Ref Range   Glucose-Capillary 212 (H) 65 - 99 mg/dL   Comment 1 Notify RN    Comment 2 Document in Chart   Glucose, capillary     Status: Abnormal   Collection Time: 07/02/17  4:47 PM  Result Value Ref Range   Glucose-Capillary 206 (H) 65 - 99 mg/dL   Comment 1 Notify RN    Comment 2 Document in Chart   Glucose, capillary     Status: Abnormal   Collection Time: 07/02/17  9:21 PM  Result Value Ref Range   Glucose-Capillary 371 (H) 65 - 99 mg/dL   Comment 1 Notify RN    Comment 2 Document in Chart   Urinalysis, Routine w reflex microscopic     Status: Abnormal   Collection Time: 07/03/17   2:00 AM  Result Value Ref Range   Color, Urine STRAW (A) YELLOW   APPearance CLEAR CLEAR   Specific Gravity, Urine 1.005 1.005 - 1.030   pH 5.0 5.0 - 8.0   Glucose, UA 150 (A) NEGATIVE mg/dL   Hgb urine dipstick NEGATIVE NEGATIVE   Bilirubin Urine NEGATIVE NEGATIVE   Ketones, ur NEGATIVE NEGATIVE mg/dL   Protein, ur NEGATIVE NEGATIVE mg/dL   Nitrite NEGATIVE NEGATIVE   Leukocytes, UA NEGATIVE NEGATIVE  Glucose, capillary     Status: Abnormal   Collection Time: 07/03/17  7:42 AM  Result Value Ref Range   Glucose-Capillary 218 (H) 65 - 99 mg/dL   Comment 1 Notify RN    Comment 2 Document in Chart   Glucose, capillary     Status: Abnormal   Collection Time: 07/03/17 11:04 AM  Result Value Ref Range   Glucose-Capillary 158 (H) 65 - 99 mg/dL   Comment 1 Notify RN    Comment 2 Document in Chart   Glucose, capillary     Status: Abnormal   Collection Time: 07/03/17  4:10 PM  Result Value Ref Range   Glucose-Capillary 315 (H) 65 - 99 mg/dL  Glucose, capillary     Status: Abnormal   Collection Time: 07/03/17  9:19 PM  Result Value Ref Range   Glucose-Capillary 352 (H) 65 -  99 mg/dL  Glucose, capillary     Status: None   Collection Time: 07/04/17  7:34 AM  Result Value Ref Range   Glucose-Capillary 76 65 - 99 mg/dL   Comment 1 Notify RN    Comment 2 Document in Chart     ABGS No results for input(s): PHART, PO2ART, TCO2, HCO3 in the last 72 hours.  Invalid input(s): PCO2 CULTURES Recent Results (from the past 240 hour(s))  Culture, sputum-assessment     Status: None   Collection Time: 07/01/17  8:09 AM  Result Value Ref Range Status   Specimen Description SPU  Final   Special Requests Immunocompromised  Final   Sputum evaluation THIS SPECIMEN IS ACCEPTABLE FOR SPUTUM CULTURE  Final   Report Status 07/01/2017 FINAL  Final  Culture, respiratory (NON-Expectorated)     Status: None   Collection Time: 07/01/17  8:09 AM  Result Value Ref Range Status   Specimen Description SPU   Final   Special Requests Immunocompromised Reflexed from [id number]  Final   Gram Stain   Final    ABUNDANT WBC PRESENT,BOTH PMN AND MONONUCLEAR MODERATE GRAM POSITIVE COCCI IN PAIRS FEW GRAM VARIABLE ROD    Culture   Final    Consistent with normal respiratory flora. Performed at [facility], [place] [address] [place], [place] [address]    Report Status 07/03/2017 FINAL  Final   Studies/Results: No results found.  Medications:  Prior to Admission:  Medications Prior to Admission  Medication Sig Dispense Refill Last Dose  . acetaminophen (TYLENOL) 500 MG tablet Take 500 mg by mouth every 6 (six) hours as needed for mild pain or moderate pain.   06/30/2017 at Unknown time  . amLODipine (NORVASC) 5 MG tablet Take 5 mg by mouth daily.     06/30/2017 at Unknown time  . brimonidine (ALPHAGAN P) 0.1 % SOLN Place 1 drop into both eyes 2 (two) times daily.     06/30/2017 at Unknown time  . busPIRone (BUSPAR) 5 MG tablet Take 5 mg 2 (two) times daily as needed by mouth.   06/30/2017 at Unknown time  . cholecalciferol (VITAMIN D) 1000 units tablet Take 1,000 Units daily by mouth.   06/30/2017 at Unknown time  . dorzolamide (TRUSOPT) 2 % ophthalmic solution Place 1 drop into both eyes 2 (two) times daily.     06/30/2017 at Unknown time  . furosemide (LASIX) 20 MG tablet Take 20 mg daily as needed by mouth for fluid.   06/30/2017 at Unknown time  . glipiZIDE (GLUCOTROL) 5 MG tablet Take 5 mg 2 (two) times daily by mouth.    06/30/2017 at Unknown time  . ipratropium-albuterol (DUONEB) 0.5-2.5 (3) MG/3ML SOLN Take 3 mLs by nebulization every 4 (four) hours as needed. Shortness of Breath   06/30/2017 at Unknown time  . latanoprost (XALATAN) 0.005 % ophthalmic solution Place 1 drop into both eyes at bedtime.     06/30/2017 at Unknown time  . omeprazole (PRILOSEC) 20 MG capsule Take 20 mg by mouth daily.     06/30/2017 at Unknown time  . oxyCODONE-acetaminophen (PERCOCET/ROXICET) 5-325 MG per tablet Take 1  tablet by mouth every 6 (six) hours as needed for severe pain. 20 tablet 0 06/30/2017 at Unknown time  . pravastatin (PRAVACHOL) 40 MG tablet Take 40 mg by mouth daily.     06/30/2017 at Unknown time  . traMADol (ULTRAM) 50 MG tablet Take 1 tablet (50 mg total) by mouth every 6 (six) hours as  needed for moderate pain. 30 tablet 0 06/30/2017 at Unknown time   Scheduled: . dorzolamide  1 drop Both Eyes BID  . enoxaparin (LOVENOX) injection  30 mg Subcutaneous Q24H  . glipiZIDE  5 mg Oral BID  . guaiFENesin  1,200 mg Oral BID  . hydrocortisone cream   Topical BID  . insulin aspart  0-15 Units Subcutaneous TID WC  . insulin aspart  0-5 Units Subcutaneous QHS  . insulin aspart  3 Units Subcutaneous TID WC  . ipratropium-albuterol  3 mL Nebulization TID  . levofloxacin  500 mg Oral Q48H  . pantoprazole  40 mg Oral Daily  . predniSONE  40 mg Oral Q breakfast  . valACYclovir  1,000 mg Oral BID   Continuous: . sodium chloride 50 mL/hr at 07/02/17 2118   IWO:[id number], albuterol, benzonatate, busPIRone, furosemide, traMADol  Assesment: She was admitted with community-acquired pneumonia and COPD exacerbation.  She is very slow to clear and still having a lot of wheezing and she is on oral prednisone but I think I am going to need to switch her to IV steroids and see if we can get a better result. Principal Problem:   CAP (community acquired pneumonia) Active Problems:   Type 2 diabetes mellitus (HCC)   COPD (chronic obstructive pulmonary disease) (HCC)   Hypercholesteremia   Glaucoma   Anxiety   Acid reflux    Plan: Switch to IV steroids.  Continue antibiotic.  Continue nebulizer treatments.  Increase the frequency of nebulizer treatments    LOS: 4 days   Maeson Lourenco L 07/04/2017, 8:18 AM

## 2017-07-05 LAB — GLUCOSE, CAPILLARY: Glucose-Capillary: 157 mg/dL — ABNORMAL HIGH (ref 65–99)

## 2017-07-05 LAB — STREP PNEUMONIAE URINARY ANTIGEN: STREP PNEUMO URINARY ANTIGEN: NEGATIVE

## 2017-07-05 MED ORDER — VALACYCLOVIR HCL 1 G PO TABS: 1000.0000 mg | ORAL_TABLET | Freq: Two times a day (BID) | ORAL | 0 refills | Status: DC

## 2017-07-05 MED ORDER — PREDNISONE 10 MG (21) PO TBPK: ORAL_TABLET | ORAL | 0 refills | Status: DC

## 2017-07-05 MED ORDER — LEVOFLOXACIN 500 MG PO TABS: 500.0000 mg | ORAL_TABLET | ORAL | 0 refills | Status: DC

## 2017-07-05 MED ORDER — HYDROCORTISONE 1 % EX CREA: TOPICAL_CREAM | Freq: Two times a day (BID) | CUTANEOUS | 0 refills | Status: DC

## 2017-07-05 NOTE — Care Management Important Message (Signed)
Important Message  Patient Details  Name: [name] MRN: [id number] Date of Birth: Oct 07, 1928   Medicare Important Message Given:  Yes    Sherald Barge, RN 07/05/2017, 10:21 AM

## 2017-07-05 NOTE — Care Management Note (Signed)
Case Management Note  Patient Details  Name: [name] MRN: [id number] Date of Birth: 1928/11/26  Expected Discharge Date:  07/05/17               Expected Discharge Plan:  Home/Self Care  In-House Referral:  NA  Discharge planning Services  CM Consult  Post Acute Care Choice:  NA Choice offered to:  NA  Status of Service:  Completed.   Additional Comments: DC home today with self care. Pt not interested in [facility] per MD note. Pt referred for [facility] Transition calls, calls explained to pt and flyer given. Pt communicates no needs or concerns about her transition home.   Sherald Barge, RN 07/05/2017, 10:22 AM

## 2017-07-05 NOTE — Progress Notes (Signed)
Pt's IV catheter removed and intact. Pt's IV site clean dry and intact. Discharge instructions including medications and follow up appointments were reviewed and discussed with patient's granddaughter Gaspar Skeeters. All questions were answered and no further questions at this time. Pt in stable condition and in no acute distress at time of discharge. Pt will be escorted  By nurse tech.

## 2017-07-05 NOTE — Discharge Summary (Signed)
Physician Discharge Summary  Patient ID: [name] MRN: [id number] DOB/AGE: Jun 13, 1929 83 y.o. Primary Care Physician:Asli Tokarski, Percell Miller, MD Admit date: 06/30/2017 Discharge date: 07/05/2017    Discharge Diagnoses:   Principal Problem:   CAP (community acquired pneumonia) Active Problems:   Type 2 diabetes mellitus ([place])   COPD (chronic obstructive pulmonary disease) (HCC)   Hypercholesteremia   Glaucoma   Anxiety   Acid reflux Shingles  Allergies as of 07/05/2017      Reactions   Hydromorphone Hcl Other (See Comments)   Patient goes out of right state of mind.       Medication List    TAKE these medications   acetaminophen 500 MG tablet Commonly known as:  TYLENOL Take 500 mg by mouth every 6 (six) hours as needed for mild pain or moderate pain.   amLODipine 5 MG tablet Commonly known as:  NORVASC Take 5 mg by mouth daily.   brimonidine 0.1 % Soln Commonly known as:  ALPHAGAN P Place 1 drop into both eyes 2 (two) times daily.   busPIRone 5 MG tablet Commonly known as:  BUSPAR Take 5 mg 2 (two) times daily as needed by mouth.   cholecalciferol 1000 units tablet Commonly known as:  VITAMIN D Take 1,000 Units daily by mouth.   dorzolamide 2 % ophthalmic solution Commonly known as:  TRUSOPT Place 1 drop into both eyes 2 (two) times daily.   furosemide 20 MG tablet Commonly known as:  LASIX Take 20 mg daily as needed by mouth for fluid.   glipiZIDE 5 MG tablet Commonly known as:  GLUCOTROL Take 5 mg 2 (two) times daily by mouth.   hydrocortisone cream 1 % Apply 2 (two) times daily topically.   ipratropium-albuterol 0.5-2.5 (3) MG/3ML Soln Commonly known as:  DUONEB Take 3 mLs by nebulization every 4 (four) hours as needed. Shortness of Breath   latanoprost 0.005 % ophthalmic solution Commonly known as:  XALATAN Place 1 drop into both eyes at bedtime.   levofloxacin 500 MG tablet Commonly known as:  LEVAQUIN Take 1 tablet (500 mg total) every other  day by mouth. Start taking on:  07/06/2017   omeprazole 20 MG capsule Commonly known as:  PRILOSEC Take 20 mg by mouth daily.   oxyCODONE-acetaminophen 5-325 MG tablet Commonly known as:  PERCOCET/ROXICET Take 1 tablet by mouth every 6 (six) hours as needed for severe pain.   pravastatin 40 MG tablet Commonly known as:  PRAVACHOL Take 40 mg by mouth daily.   predniSONE 10 MG (21) Tbpk tablet Commonly known as:  STERAPRED UNI-PAK 21 TAB Take by package directions   traMADol 50 MG tablet Commonly known as:  ULTRAM Take 1 tablet (50 mg total) by mouth every 6 (six) hours as needed for moderate pain.   valACYclovir 1000 MG tablet Commonly known as:  VALTREX Take 1 tablet (1,000 mg total) 2 (two) times daily by mouth.       Discharged Condition: Improved    Consults: None  Significant Diagnostic Studies: Dg Chest 2 View  Result Date: 06/30/2017 CLINICAL DATA:  Cough congestion and fever EXAM: CHEST  2 VIEW COMPARISON:  07/17/2011 FINDINGS: Streaky opacity at the lingula. Hyperinflation. No pleural effusion. Mild cardiomegaly with aortic atherosclerosis. No pneumothorax. Possible distal resection of right clavicle. IMPRESSION: 1. Streaky opacity at the lingula, may reflect atelectasis scar or infiltrate 2. Mild cardiomegaly Electronically Signed   By: Donavan Foil M.D.   On: 06/30/2017 19:01    Lab Results: Basic  Metabolic Panel: No results for input(s): NA, K, CL, CO2, GLUCOSE, BUN, CREATININE, CALCIUM, MG, PHOS in the last 72 hours. Liver Function Tests: No results for input(s): AST, ALT, ALKPHOS, BILITOT, PROT, ALBUMIN in the last 72 hours.   CBC: No results for input(s): WBC, NEUTROABS, HGB, HCT, MCV, PLT in the last 72 hours.  Recent Results (from the past 240 hour(s))  Culture, sputum-assessment     Status: None   Collection Time: 07/01/17  8:09 AM  Result Value Ref Range Status   Specimen Description SPU  Final   Special Requests Immunocompromised  Final    Sputum evaluation THIS SPECIMEN IS ACCEPTABLE FOR SPUTUM CULTURE  Final   Report Status 07/01/2017 FINAL  Final  Culture, respiratory (NON-Expectorated)     Status: None   Collection Time: 07/01/17  8:09 AM  Result Value Ref Range Status   Specimen Description SPU  Final   Special Requests Immunocompromised Reflexed from [id number]  Final   Gram Stain   Final    ABUNDANT WBC PRESENT,BOTH PMN AND MONONUCLEAR MODERATE GRAM POSITIVE COCCI IN PAIRS FEW GRAM VARIABLE ROD    Culture   Final    Consistent with normal respiratory flora. Performed at [facility], [place] [address], [place], [place] [address]    Report Status 07/03/2017 FINAL  Final     Hospital Course: This is an 83 year old who was in her usual state of fair health at home when she developed increasing problems with cough and congestion.  She said she tried to treat this with over-the-counter medications at home but became increasingly short of breath and came to the emergency chest x-ray in the emergency department showed atelectasis but based on her clinical course this appeared to be pneumonia.  She was treated for community-acquired pneumonia.  She grew normal flora but there was at least a suggestion that this might have been Haemophilus based on the Gram stain.  She was somewhat slow to respond but by the time of discharge was much better able to ambulate still coughing but not as short of breath.  While in the hospital she developed a rash on her left flank area typical of shingles and this was treated  Discharge Exam: Blood pressure 127/82, pulse 70, temperature 98.9 F (37.2 C), temperature source Oral, resp. rate 20, height 5\' 3"  (1.6 m), weight 81.4 kg (179 lb 8 oz), SpO2 98 %. She is awake and alert.  She has rhonchi bilaterally.  Disposition: Home discussed home health services which she does not want.  She is going to go home and her granddaughter will be with her for support.      Signed: Jordann Grime  L   07/05/2017, 8:32 AM

## 2017-07-05 NOTE — Progress Notes (Signed)
Subjective: She says she feels much better.  She has no complaints. She wants to go home.  She is coughing up sputum still.  She is been able to walk using a walker. Objective: Vital signs in last 24 hours: Temp:  [98 F (36.7 C)-98.9 F (37.2 C)] 98.9 F (37.2 C) (11/09 0504) Pulse Rate:  [70-82] 70 (11/09 0504) Resp:  [20] 20 (11/09 0504) BP: (127-150)/(82-98) 127/82 (11/09 0504) SpO2:  [96 %-99 %] 98 % (11/09 0724) Weight change:  Last BM Date: 07/04/17  Intake/Output from previous day: 11/08 0701 - 11/09 0700 In: 240 [P.O.:240] Out: 300 [Urine:300]  PHYSICAL EXAM General appearance: alert, cooperative and no distress Resp: rhonchi bilaterally Cardio: regular rate and rhythm, S1, S2 normal, no murmur, click, rub or gallop GI: soft, non-tender; bowel sounds normal; no masses,  no organomegaly Extremities: extremities normal, atraumatic, no cyanosis or edema The rash on her abdomen is much improved  Lab Results:  Results for orders placed or performed during the hospital encounter of 06/30/17 (from the past 48 hour(s))  Glucose, capillary     Status: Abnormal   Collection Time: 07/03/17 11:04 AM  Result Value Ref Range   Glucose-Capillary 158 (H) 65 - 99 mg/dL   Comment 1 Notify RN    Comment 2 Document in Chart   Glucose, capillary     Status: Abnormal   Collection Time: 07/03/17  4:10 PM  Result Value Ref Range   Glucose-Capillary 315 (H) 65 - 99 mg/dL  Glucose, capillary     Status: Abnormal   Collection Time: 07/03/17  9:19 PM  Result Value Ref Range   Glucose-Capillary 352 (H) 65 - 99 mg/dL  Glucose, capillary     Status: None   Collection Time: 07/04/17  7:34 AM  Result Value Ref Range   Glucose-Capillary 76 65 - 99 mg/dL   Comment 1 Notify RN    Comment 2 Document in Chart   Glucose, capillary     Status: None   Collection Time: 07/04/17 11:03 AM  Result Value Ref Range   Glucose-Capillary 90 65 - 99 mg/dL   Comment 1 Notify RN    Comment 2 Document in  Chart   Glucose, capillary     Status: Abnormal   Collection Time: 07/04/17  4:23 PM  Result Value Ref Range   Glucose-Capillary 220 (H) 65 - 99 mg/dL   Comment 1 Notify RN    Comment 2 Document in Chart   Glucose, capillary     Status: Abnormal   Collection Time: 07/04/17  9:49 PM  Result Value Ref Range   Glucose-Capillary 300 (H) 65 - 99 mg/dL   Comment 1 Notify RN    Comment 2 Document in Chart   Glucose, capillary     Status: Abnormal   Collection Time: 07/05/17  7:37 AM  Result Value Ref Range   Glucose-Capillary 157 (H) 65 - 99 mg/dL    ABGS No results for input(s): PHART, PO2ART, TCO2, HCO3 in the last 72 hours.  Invalid input(s): PCO2 CULTURES Recent Results (from the past 240 hour(s))  Culture, sputum-assessment     Status: None   Collection Time: 07/01/17  8:09 AM  Result Value Ref Range Status   Specimen Description SPU  Final   Special Requests Immunocompromised  Final   Sputum evaluation THIS SPECIMEN IS ACCEPTABLE FOR SPUTUM CULTURE  Final   Report Status 07/01/2017 FINAL  Final  Culture, respiratory (NON-Expectorated)     Status: None  Collection Time: 07/01/17  8:09 AM  Result Value Ref Range Status   Specimen Description SPU  Final   Special Requests Immunocompromised Reflexed from [id number]  Final   Gram Stain   Final    ABUNDANT WBC PRESENT,BOTH PMN AND MONONUCLEAR MODERATE GRAM POSITIVE COCCI IN PAIRS FEW GRAM VARIABLE ROD    Culture   Final    Consistent with normal respiratory flora. Performed at [facility], [place] [address], [place], [place] [address]    Report Status 07/03/2017 FINAL  Final   Studies/Results: No results found.  Medications:  Prior to Admission:  Medications Prior to Admission  Medication Sig Dispense Refill Last Dose  . acetaminophen (TYLENOL) 500 MG tablet Take 500 mg by mouth every 6 (six) hours as needed for mild pain or moderate pain.   06/30/2017 at Unknown time  . amLODipine (NORVASC) 5 MG tablet Take 5 mg by  mouth daily.     06/30/2017 at Unknown time  . brimonidine (ALPHAGAN P) 0.1 % SOLN Place 1 drop into both eyes 2 (two) times daily.     06/30/2017 at Unknown time  . busPIRone (BUSPAR) 5 MG tablet Take 5 mg 2 (two) times daily as needed by mouth.   06/30/2017 at Unknown time  . cholecalciferol (VITAMIN D) 1000 units tablet Take 1,000 Units daily by mouth.   06/30/2017 at Unknown time  . dorzolamide (TRUSOPT) 2 % ophthalmic solution Place 1 drop into both eyes 2 (two) times daily.     06/30/2017 at Unknown time  . furosemide (LASIX) 20 MG tablet Take 20 mg daily as needed by mouth for fluid.   06/30/2017 at Unknown time  . glipiZIDE (GLUCOTROL) 5 MG tablet Take 5 mg 2 (two) times daily by mouth.    06/30/2017 at Unknown time  . ipratropium-albuterol (DUONEB) 0.5-2.5 (3) MG/3ML SOLN Take 3 mLs by nebulization every 4 (four) hours as needed. Shortness of Breath   06/30/2017 at Unknown time  . latanoprost (XALATAN) 0.005 % ophthalmic solution Place 1 drop into both eyes at bedtime.     06/30/2017 at Unknown time  . omeprazole (PRILOSEC) 20 MG capsule Take 20 mg by mouth daily.     06/30/2017 at Unknown time  . oxyCODONE-acetaminophen (PERCOCET/ROXICET) 5-325 MG per tablet Take 1 tablet by mouth every 6 (six) hours as needed for severe pain. 20 tablet 0 06/30/2017 at Unknown time  . pravastatin (PRAVACHOL) 40 MG tablet Take 40 mg by mouth daily.     06/30/2017 at Unknown time  . traMADol (ULTRAM) 50 MG tablet Take 1 tablet (50 mg total) by mouth every 6 (six) hours as needed for moderate pain. 30 tablet 0 06/30/2017 at Unknown time   Scheduled: . dorzolamide  1 drop Both Eyes BID  . enoxaparin (LOVENOX) injection  30 mg Subcutaneous Q24H  . glipiZIDE  5 mg Oral BID  . guaiFENesin  1,200 mg Oral BID  . hydrocortisone cream   Topical BID  . insulin aspart  0-15 Units Subcutaneous TID WC  . insulin aspart  0-5 Units Subcutaneous QHS  . insulin aspart  3 Units Subcutaneous TID WC  . ipratropium-albuterol  3 mL  Nebulization Q6H  . levofloxacin  500 mg Oral Q48H  . methylPREDNISolone (SOLU-MEDROL) injection  40 mg Intravenous Q12H  . pantoprazole  40 mg Oral Daily  . valACYclovir  1,000 mg Oral BID   Continuous: . sodium chloride 50 mL/hr at 07/04/17 2345   OZH:[id number], albuterol, benzonatate, busPIRone, furosemide, traMADol  Assesment: She was admitted with community-acquired pneumonia.  She has COPD at baseline.  She was slow to clear but is much better today and is ready for discharge. Principal Problem:   CAP (community acquired pneumonia) Active Problems:   Type 2 diabetes mellitus (HCC)   COPD (chronic obstructive pulmonary disease) (HCC)   Hypercholesteremia   Glaucoma   Anxiety   Acid reflux    Plan: Discharge home today    LOS: 5 days   Averiana Clouatre L 07/05/2017, 8:26 AM

## 2017-07-12 ENCOUNTER — Other Ambulatory Visit: Payer: Self-pay

## 2017-07-12 NOTE — Patient Outreach (Signed)
[facility]) Care Management  07/12/2017  [name] Feb 08, 1929 [id number]   EMMI: Pneumonia Referral Date: 07/12/17  Referral Source: EMMI dashboard Referral Reason: Wheezing more than yesterday-yes Day # 3   Outreach attempt # 1 Spoke with patient she is able to verify HIPAA.  Patient reports she is doing ok. Reviewed and addressed red alert.  Patient reports that her wheezing today is better and not having any other issues.  Asked patient about primary care follow up appointment. Patient state she is not sure when it is but her daughter knows as they normally go at the same time.  Stressed the importance of follow up appointment and seeking help if condition worsens. She verbalized understanding.  Discussed with patient [place] but patient declined services at this time but is agreeable to receive letter.    Advised patient that they would continue to get automated EMMI-PNA post discharge calls to assess how they are doing following recent hospitalization and will receive a call from a nurse if any of their responses were abnormal. Patient voiced understanding and was appreciative of f/u call.  Plan: RN CM will close case and send letter and brochure. RN CM will notify care management assistant of case closure.   [name], RN, MSN [facility] Care Management Care Management Coordinator Direct Line [phone] Toll Free: [phone]  Fax: [phone]

## 2017-07-14 ENCOUNTER — Inpatient Hospital Stay (HOSPITAL_COMMUNITY)
Admission: EM | Admit: 2017-07-14 | Discharge: 2017-07-18 | DRG: 446 | Disposition: A | Discharge status: Home / Self Care | Payer: Medicare Other | Attending: Pulmonary Disease | Admitting: Pulmonary Disease

## 2017-07-14 ENCOUNTER — Emergency Department (HOSPITAL_COMMUNITY): Payer: Medicare Other

## 2017-07-14 ENCOUNTER — Encounter (HOSPITAL_COMMUNITY): Payer: Self-pay | Admitting: Emergency Medicine

## 2017-07-14 ENCOUNTER — Other Ambulatory Visit: Payer: Self-pay

## 2017-07-14 DIAGNOSIS — R1011 Right upper quadrant pain: Secondary | ICD-10-CM | POA: Diagnosis not present

## 2017-07-14 DIAGNOSIS — J439 Emphysema, unspecified: Secondary | ICD-10-CM | POA: Diagnosis not present

## 2017-07-14 DIAGNOSIS — Z66 Do not resuscitate: Secondary | ICD-10-CM | POA: Diagnosis present

## 2017-07-14 DIAGNOSIS — I129 Hypertensive chronic kidney disease with stage 1 through stage 4 chronic kidney disease, or unspecified chronic kidney disease: Secondary | ICD-10-CM | POA: Diagnosis present

## 2017-07-14 DIAGNOSIS — R748 Abnormal levels of other serum enzymes: Secondary | ICD-10-CM | POA: Diagnosis present

## 2017-07-14 DIAGNOSIS — J449 Chronic obstructive pulmonary disease, unspecified: Secondary | ICD-10-CM | POA: Diagnosis present

## 2017-07-14 DIAGNOSIS — N1832 Chronic kidney disease, stage 3b: Secondary | ICD-10-CM | POA: Diagnosis present

## 2017-07-14 DIAGNOSIS — H5461 Unqualified visual loss, right eye, normal vision left eye: Secondary | ICD-10-CM | POA: Diagnosis present

## 2017-07-14 DIAGNOSIS — Z820 Family history of epilepsy and other diseases of the nervous system: Secondary | ICD-10-CM | POA: Diagnosis not present

## 2017-07-14 DIAGNOSIS — R1031 Right lower quadrant pain: Secondary | ICD-10-CM | POA: Diagnosis not present

## 2017-07-14 DIAGNOSIS — R1084 Generalized abdominal pain: Secondary | ICD-10-CM | POA: Diagnosis not present

## 2017-07-14 DIAGNOSIS — N183 Chronic kidney disease, stage 3 (moderate): Secondary | ICD-10-CM | POA: Diagnosis present

## 2017-07-14 DIAGNOSIS — K831 Obstruction of bile duct: Secondary | ICD-10-CM | POA: Diagnosis not present

## 2017-07-14 DIAGNOSIS — K8080 Other cholelithiasis without obstruction: Secondary | ICD-10-CM | POA: Diagnosis not present

## 2017-07-14 DIAGNOSIS — R945 Abnormal results of liver function studies: Secondary | ICD-10-CM | POA: Diagnosis not present

## 2017-07-14 DIAGNOSIS — G8929 Other chronic pain: Secondary | ICD-10-CM | POA: Diagnosis present

## 2017-07-14 DIAGNOSIS — K838 Other specified diseases of biliary tract: Secondary | ICD-10-CM | POA: Diagnosis not present

## 2017-07-14 DIAGNOSIS — Z7984 Long term (current) use of oral hypoglycemic drugs: Secondary | ICD-10-CM

## 2017-07-14 DIAGNOSIS — E785 Hyperlipidemia, unspecified: Secondary | ICD-10-CM | POA: Diagnosis present

## 2017-07-14 DIAGNOSIS — R52 Pain, unspecified: Secondary | ICD-10-CM

## 2017-07-14 DIAGNOSIS — I361 Nonrheumatic tricuspid (valve) insufficiency: Secondary | ICD-10-CM | POA: Diagnosis not present

## 2017-07-14 DIAGNOSIS — I4891 Unspecified atrial fibrillation: Secondary | ICD-10-CM | POA: Diagnosis present

## 2017-07-14 DIAGNOSIS — E1122 Type 2 diabetes mellitus with diabetic chronic kidney disease: Secondary | ICD-10-CM | POA: Diagnosis present

## 2017-07-14 DIAGNOSIS — I10 Essential (primary) hypertension: Secondary | ICD-10-CM | POA: Diagnosis present

## 2017-07-14 DIAGNOSIS — R1010 Upper abdominal pain, unspecified: Secondary | ICD-10-CM | POA: Diagnosis not present

## 2017-07-14 DIAGNOSIS — Z808 Family history of malignant neoplasm of other organs or systems: Secondary | ICD-10-CM

## 2017-07-14 DIAGNOSIS — K8050 Calculus of bile duct without cholangitis or cholecystitis without obstruction: Secondary | ICD-10-CM | POA: Diagnosis not present

## 2017-07-14 DIAGNOSIS — R1013 Epigastric pain: Secondary | ICD-10-CM | POA: Diagnosis not present

## 2017-07-14 DIAGNOSIS — K219 Gastro-esophageal reflux disease without esophagitis: Secondary | ICD-10-CM | POA: Diagnosis present

## 2017-07-14 DIAGNOSIS — N200 Calculus of kidney: Secondary | ICD-10-CM | POA: Diagnosis not present

## 2017-07-14 DIAGNOSIS — R7989 Other specified abnormal findings of blood chemistry: Secondary | ICD-10-CM | POA: Diagnosis present

## 2017-07-14 DIAGNOSIS — H409 Unspecified glaucoma: Secondary | ICD-10-CM | POA: Diagnosis present

## 2017-07-14 DIAGNOSIS — E119 Type 2 diabetes mellitus without complications: Secondary | ICD-10-CM

## 2017-07-14 DIAGNOSIS — R9389 Abnormal findings on diagnostic imaging of other specified body structures: Secondary | ICD-10-CM | POA: Diagnosis not present

## 2017-07-14 HISTORY — DX: Essential (primary) hypertension: I10

## 2017-07-14 HISTORY — DX: Chronic kidney disease, unspecified: N18.9

## 2017-07-14 LAB — CBC WITH DIFFERENTIAL/PLATELET
BASOS ABS: 0 10*3/uL (ref 0.0–0.1)
BASOS PCT: 0 %
EOS PCT: 0 %
Eosinophils Absolute: 0 10*3/uL (ref 0.0–0.7)
HCT: 40.4 % (ref 36.0–46.0)
Hemoglobin: 13.7 g/dL (ref 12.0–15.0)
LYMPHS PCT: 8 %
Lymphs Abs: 0.9 10*3/uL (ref 0.7–4.0)
MCH: 31.2 pg (ref 26.0–34.0)
MCHC: 33.9 g/dL (ref 30.0–36.0)
MCV: 92 fL (ref 78.0–100.0)
MONO ABS: 0.8 10*3/uL (ref 0.1–1.0)
Monocytes Relative: 7 %
NEUTROS ABS: 8.9 10*3/uL — AB (ref 1.7–7.7)
Neutrophils Relative %: 85 %
PLATELETS: 163 10*3/uL (ref 150–400)
RBC: 4.39 MIL/uL (ref 3.87–5.11)
RDW: 13.7 % (ref 11.5–15.5)
WBC: 10.5 10*3/uL (ref 4.0–10.5)

## 2017-07-14 LAB — COMPREHENSIVE METABOLIC PANEL
ALBUMIN: 3.3 g/dL — AB (ref 3.5–5.0)
ALT: 108 U/L — ABNORMAL HIGH (ref 14–54)
ANION GAP: 9 (ref 5–15)
AST: 72 U/L — AB (ref 15–41)
Alkaline Phosphatase: 152 U/L — ABNORMAL HIGH (ref 38–126)
BUN: 37 mg/dL — AB (ref 6–20)
CHLORIDE: 103 mmol/L (ref 101–111)
CO2: 22 mmol/L (ref 22–32)
Calcium: 8.6 mg/dL — ABNORMAL LOW (ref 8.9–10.3)
Creatinine, Ser: 1.75 mg/dL — ABNORMAL HIGH (ref 0.44–1.00)
GFR calc Af Amer: 29 mL/min — ABNORMAL LOW (ref >60)
GFR calc non Af Amer: 25 mL/min — ABNORMAL LOW (ref >60)
GLUCOSE: 367 mg/dL — AB (ref 65–99)
POTASSIUM: 4 mmol/L (ref 3.5–5.1)
SODIUM: 134 mmol/L — AB (ref 135–145)
TOTAL PROTEIN: 5.7 g/dL — AB (ref 6.5–8.1)
Total Bilirubin: 2.1 mg/dL — ABNORMAL HIGH (ref 0.3–1.2)

## 2017-07-14 LAB — URINALYSIS, DIPSTICK ONLY
Bilirubin Urine: NEGATIVE
GLUCOSE, UA: 50 mg/dL — AB
Hgb urine dipstick: NEGATIVE
Ketones, ur: NEGATIVE mg/dL
Nitrite: NEGATIVE
PH: 5 (ref 5.0–8.0)
PROTEIN: NEGATIVE mg/dL
SPECIFIC GRAVITY, URINE: 1.019 (ref 1.005–1.030)

## 2017-07-14 LAB — GLUCOSE, CAPILLARY: Glucose-Capillary: 299 mg/dL — ABNORMAL HIGH (ref 65–99)

## 2017-07-14 LAB — LIPASE, BLOOD: Lipase: 18 U/L (ref 11–51)

## 2017-07-14 MED ORDER — IPRATROPIUM-ALBUTEROL 0.5-2.5 (3) MG/3ML IN SOLN: 3.0000 mL | RESPIRATORY_TRACT | Status: DC | PRN

## 2017-07-14 MED ORDER — AMLODIPINE BESYLATE 5 MG PO TABS
5.0000 mg | ORAL_TABLET | Freq: Every day | ORAL | Status: DC
  Administered 2017-07-17 – 2017-07-18 (×2): 5 mg via ORAL
  Filled 2017-07-14 (×3): qty 1

## 2017-07-14 MED ORDER — ACETAMINOPHEN 650 MG RE SUPP: 650.0000 mg | Freq: Four times a day (QID) | RECTAL | Status: DC | PRN

## 2017-07-14 MED ORDER — MORPHINE SULFATE (PF) 4 MG/ML IV SOLN
4.0000 mg | Freq: Once | INTRAVENOUS | Status: AC
  Administered 2017-07-14: 4 mg via INTRAVENOUS
  Filled 2017-07-14: qty 1

## 2017-07-14 MED ORDER — ONDANSETRON HCL 4 MG/2ML IJ SOLN
4.0000 mg | Freq: Once | INTRAMUSCULAR | Status: AC
  Administered 2017-07-14: 4 mg via INTRAVENOUS
  Filled 2017-07-14: qty 2

## 2017-07-14 MED ORDER — INSULIN ASPART 100 UNIT/ML ~~LOC~~ SOLN
0.0000 [IU] | Freq: Three times a day (TID) | SUBCUTANEOUS | Status: DC
  Administered 2017-07-15 (×2): 3 [IU] via SUBCUTANEOUS
  Administered 2017-07-16: 5 [IU] via SUBCUTANEOUS
  Administered 2017-07-16 – 2017-07-17 (×3): 2 [IU] via SUBCUTANEOUS
  Administered 2017-07-17: 5 [IU] via SUBCUTANEOUS
  Administered 2017-07-18: 2 [IU] via SUBCUTANEOUS

## 2017-07-14 MED ORDER — IPRATROPIUM-ALBUTEROL 0.5-2.5 (3) MG/3ML IN SOLN
3.0000 mL | Freq: Once | RESPIRATORY_TRACT | Status: AC
Start: 2017-07-14 — End: 2017-07-14
  Administered 2017-07-14: 3 mL via RESPIRATORY_TRACT
  Filled 2017-07-14: qty 3

## 2017-07-14 MED ORDER — MORPHINE SULFATE (PF) 2 MG/ML IV SOLN
2.0000 mg | INTRAVENOUS | Status: DC | PRN
  Administered 2017-07-14 – 2017-07-15 (×2): 2 mg via INTRAVENOUS
  Administered 2017-07-15: 1 mg via INTRAVENOUS
  Administered 2017-07-15 – 2017-07-16 (×4): 2 mg via INTRAVENOUS
  Filled 2017-07-14 (×7): qty 1

## 2017-07-14 MED ORDER — SODIUM CHLORIDE 0.9 % IV BOLUS (SEPSIS)
500.0000 mL | Freq: Once | INTRAVENOUS | Status: AC
  Administered 2017-07-14: 500 mL via INTRAVENOUS

## 2017-07-14 MED ORDER — HYDROMORPHONE HCL 1 MG/ML IJ SOLN: 0.5000 mg | Freq: Once | INTRAMUSCULAR | Status: DC

## 2017-07-14 MED ORDER — ORAL CARE MOUTH RINSE
15.0000 mL | Freq: Two times a day (BID) | OROMUCOSAL | Status: DC
  Administered 2017-07-15 (×2): 15 mL via OROMUCOSAL

## 2017-07-14 MED ORDER — BRIMONIDINE TARTRATE 0.15 % OP SOLN
1.0000 [drp] | Freq: Two times a day (BID) | OPHTHALMIC | Status: DC
  Administered 2017-07-18: 1 [drp] via OPHTHALMIC
  Filled 2017-07-14: qty 5

## 2017-07-14 MED ORDER — ACETAMINOPHEN 325 MG PO TABS: 650.0000 mg | ORAL_TABLET | Freq: Four times a day (QID) | ORAL | Status: DC | PRN

## 2017-07-14 MED ORDER — INSULIN ASPART 100 UNIT/ML ~~LOC~~ SOLN
0.0000 [IU] | Freq: Every day | SUBCUTANEOUS | Status: DC
  Administered 2017-07-14: 3 [IU] via SUBCUTANEOUS
  Administered 2017-07-16 – 2017-07-17 (×2): 2 [IU] via SUBCUTANEOUS

## 2017-07-14 MED ORDER — ALBUTEROL SULFATE (2.5 MG/3ML) 0.083% IN NEBU
2.5000 mg | INHALATION_SOLUTION | Freq: Once | RESPIRATORY_TRACT | Status: AC
  Administered 2017-07-14: 2.5 mg via RESPIRATORY_TRACT
  Filled 2017-07-14: qty 3

## 2017-07-14 MED ORDER — ONDANSETRON HCL 4 MG/2ML IJ SOLN
4.0000 mg | Freq: Four times a day (QID) | INTRAMUSCULAR | Status: DC | PRN
  Administered 2017-07-16: 4 mg via INTRAVENOUS
  Filled 2017-07-14: qty 2

## 2017-07-14 MED ORDER — ONDANSETRON HCL 4 MG PO TABS: 4.0000 mg | ORAL_TABLET | Freq: Four times a day (QID) | ORAL | Status: DC | PRN

## 2017-07-14 MED ORDER — DORZOLAMIDE HCL 2 % OP SOLN
1.0000 [drp] | Freq: Two times a day (BID) | OPHTHALMIC | Status: DC
  Administered 2017-07-18: 1 [drp] via OPHTHALMIC
  Filled 2017-07-14: qty 10

## 2017-07-14 MED ORDER — LATANOPROST 0.005 % OP SOLN
1.0000 [drp] | Freq: Every day | OPHTHALMIC | Status: DC
  Filled 2017-07-14: qty 2.5

## 2017-07-14 MED ORDER — PRAVASTATIN SODIUM 40 MG PO TABS
40.0000 mg | ORAL_TABLET | Freq: Every day | ORAL | Status: DC
  Administered 2017-07-15 – 2017-07-18 (×3): 40 mg via ORAL
  Filled 2017-07-14 (×3): qty 1

## 2017-07-14 MED ORDER — PANTOPRAZOLE SODIUM 40 MG PO TBEC
40.0000 mg | DELAYED_RELEASE_TABLET | Freq: Every day | ORAL | Status: DC
  Administered 2017-07-15 – 2017-07-18 (×3): 40 mg via ORAL
  Filled 2017-07-14 (×3): qty 1

## 2017-07-14 MED ORDER — LORAZEPAM 2 MG/ML IJ SOLN: 1.0000 mg | Freq: Once | INTRAMUSCULAR | Status: DC

## 2017-07-14 MED ORDER — BUSPIRONE HCL 5 MG PO TABS: 5.0000 mg | ORAL_TABLET | Freq: Two times a day (BID) | ORAL | Status: DC | PRN

## 2017-07-14 NOTE — H&P (Signed)
History and Physical    [name] XQJ:[id number] DOB: Nov 12, 1928 DOA: 07/14/2017  PCP: Sinda Du, MD Consultants:  Lowanda Foster - nephrology; eye Patient coming from:  Home - lives with son; NOK: granddaughter, [phone]  Chief Complaint: abdominal pain  HPI: [name] is a 83 y.o. female with medical history significant of DM; glaucoma; HTN; HLD; COPD; and CKD with recent hospitalization (11/4-9) for CAP presenting with abdominal pain.  She has had RUQ pain for 1-2 years with development of severe pain the last 1-2 days.  Pain comes and goes, seems to be related with eating sometimes.  No N/V.  Last BM was yesterday and normal.  No diarrhea.  She tried lying down, walking to ease the pain but neither worked.  She reports having had her gallbladder removed years ago with her appendix.  No fevers.   ED Course: CT with dilated CBD 2.5 cm.  Needs an MRCP.  Review of Systems: As per HPI; otherwise review of systems reviewed and negative.   Ambulatory Status:  Ambulates with a walker  Past Medical History:  Diagnosis Date  . Acid reflux   . Anxiety   . CKD (chronic kidney disease)   . COPD (chronic obstructive pulmonary disease) ([place])   . Depressed   . Diabetes mellitus   . Glaucoma   . Hypercholesteremia   . Hypertension   . Legally blind in right eye, as defined in [place]   . Panic attacks   . Type 2 diabetes mellitus ([place])     Past Surgical History:  Procedure Laterality Date  . APPENDECTOMY    . CHOLECYSTECTOMY    . HIP SURGERY    . TUBAL LIGATION      Social History   Socioeconomic History  . Marital status: Divorced    Spouse name: Not on file  . Number of children: Not on file  . Years of education: Not on file  . Highest education level: Not on file  Social Needs  . Financial resource strain: Not on file  . Food insecurity - worry: Not on file  . Food insecurity - inability: Not on file  . Transportation needs - medical: Not on file  .  Transportation needs - non-medical: Not on file  Occupational History  . Not on file  Tobacco Use  . Smoking status: Never Smoker  . Smokeless tobacco: Never Used  Substance and Sexual Activity  . Alcohol use: No  . Drug use: No  . Sexual activity: No  Other Topics Concern  . Not on file  Social History Narrative  . Not on file    Allergies  Allergen Reactions  . Hydromorphone Hcl Other (See Comments)    Patient goes out of right state of mind.     Family History  Problem Relation Age of Onset  . Blindness Father   . Brain cancer Sister   . Alzheimer's disease Brother     Prior to Admission medications   Medication Sig Start Date End Date Taking? Authorizing Provider  amLODipine (NORVASC) 5 MG tablet Take 5 mg by mouth daily.     Yes [provider]  busPIRone (BUSPAR) 5 MG tablet Take 5 mg 2 (two) times daily as needed by mouth.   Yes [provider]  cholecalciferol (VITAMIN D) 1000 units tablet Take 1,000 Units daily by mouth.   Yes [provider]  dorzolamide (TRUSOPT) 2 % ophthalmic solution Place 1 drop into both eyes 2 (two) times daily.  Yes [provider]  furosemide (LASIX) 20 MG tablet Take 20 mg daily as needed by mouth for fluid.   Yes [provider]  glipiZIDE (GLUCOTROL) 5 MG tablet Take 5 mg 2 (two) times daily by mouth.    Yes [provider]  hydrocortisone cream 1 % Apply 2 (two) times daily topically. 07/05/17  Yes Sinda Du, MD  omeprazole (PRILOSEC) 20 MG capsule Take 20 mg by mouth daily.     Yes [provider]  pravastatin (PRAVACHOL) 40 MG tablet Take 40 mg by mouth daily.     Yes [provider]  traMADol (ULTRAM) 50 MG tablet Take 1 tablet (50 mg total) by mouth every 6 (six) hours as needed for moderate pain. 04/05/15  Yes Dorie Rank, MD  acetaminophen (TYLENOL) 500 MG tablet Take 500 mg by mouth every 6 (six) hours as needed for mild pain or moderate pain.     [provider]  brimonidine (ALPHAGAN P) 0.1 % SOLN Place 1 drop into both eyes 2 (two) times daily.      [provider]  ipratropium-albuterol (DUONEB) 0.5-2.5 (3) MG/3ML SOLN Take 3 mLs by nebulization every 4 (four) hours as needed. Shortness of Breath    [provider]  latanoprost (XALATAN) 0.005 % ophthalmic solution Place 1 drop into both eyes at bedtime.      [provider]  levofloxacin (LEVAQUIN) 500 MG tablet Take 1 tablet (500 mg total) every other day by mouth. 07/06/17   Sinda Du, MD  oxyCODONE-acetaminophen (PERCOCET/ROXICET) 5-325 MG per tablet Take 1 tablet by mouth every 6 (six) hours as needed for severe pain. Patient not taking: Reported on 07/14/2017 04/06/15   Rolland Porter, MD  predniSONE (STERAPRED UNI-PAK 21 TAB) 10 MG (21) TBPK tablet Take by package directions Patient not taking: Reported on 07/14/2017 07/05/17   Sinda Du, MD  valACYclovir (VALTREX) 1000 MG tablet Take 1 tablet (1,000 mg total) 2 (two) times daily by mouth. Patient not taking: Reported on 07/14/2017 07/05/17   Sinda Du, MD    Physical Exam: Vitals:   07/14/17 1256 07/14/17 1700 07/14/17 1730 07/14/17 1822  BP: 122/79 132/80 100/77   Pulse: (!) 105 74    Resp: 18 16    Temp: 99 F (37.2 C)     TempSrc: Oral     SpO2: 97% 95%  99%  Weight: 81.2 kg (179 lb)     Height: 5\' 3"  (1.6 m)        General:  Appears calm and comfortable and is NAD Eyes:  PERRL, EOMI, normal lids, iris ENT:  grossly normal hearing, lips & tongue, mmm; appropriate dentition Neck:  no LAD, masses or thyromegaly Cardiovascular:  RRR, no m/r/g. No LE edema.  Respiratory:   CTA bilaterally with no wheezes/rales/rhonchi.  Normal respiratory effort. Abdomen:  soft, TTP in RUQ >RLQ and midepigastric region, ND, NABS Skin:  no rash or induration seen on limited exam Musculoskeletal:  grossly normal tone BUE/BLE, good ROM, no bony abnormality Lower extremity: No LE edema.     2+ distal pulses. Psychiatric:  grossly normal mood and affect, speech fluent and appropriate, AOx3 Neurologic:  CN 2-12 grossly intact, moves all extremities in coordinated fashion, sensation intact    Radiological Exams on Admission: Ct Abdomen Pelvis Wo Contrast  Result Date: 07/14/2017 CLINICAL DATA:  Epigastric, periumbilical and right lower quadrant pain for 2 days. EXAM: CT ABDOMEN AND PELVIS WITHOUT CONTRAST TECHNIQUE: Multidetector CT imaging of the abdomen and  pelvis was performed following the standard protocol without IV contrast. COMPARISON:  CT abdomen dated 03/30/2005. FINDINGS: Lower chest: No acute findings. Mild focal scarring/atelectasis at the right lung base. Hepatobiliary: No focal liver abnormality is seen. There is marked common bile duct dilatation, measuring up to approximately 2.5 cm. Gallbladder is not seen, presumed cholecystectomy. Pancreas: Fatty infiltration of the pancreas. Otherwise unremarkable. Spleen: Normal in size without focal abnormality. Adrenals/Urinary Tract: Adrenal glands are unremarkable. Parapelvic renal cysts bilaterally. No renal stone or hydronephrosis. No ureteral or bladder calculi identified. Bladder is unremarkable, partially decompressed. Stomach/Bowel: Extensive diverticulosis throughout the colon but no focal inflammatory change to suggest acute diverticulitis. Stomach is unremarkable. No dilated large or small bowel loops. Appendix is not seen but there are no inflammatory changes about the cecum to suggest acute appendicitis. Vascular/Lymphatic: Aortic atherosclerosis. No enlarged lymph nodes seen. Reproductive: Presumed hysterectomy. No adnexal mass or inflammatory change. Other: No free fluid or abscess collection. No free intraperitoneal air. Musculoskeletal: Mild degenerative change of the slightly scoliotic thoracolumbar spine. No acute or suspicious osseous finding. Superficial soft tissues are unremarkable. IMPRESSION: 1. Marked common  bile duct dilatation, measuring up to 2.5 cm diameter. No obstructing stone identified. Recommend correlation with liver function tests. Consider MRCP for further characterization. 2. Extensive colonic diverticulosis without evidence of acute diverticulitis. 3. Aortic atherosclerosis. 4. Remainder of the abdomen and pelvis is unremarkable, as detailed above. No bowel obstruction or evidence of bowel wall inflammation. No free fluid. No evidence of acute solid organ abnormality. No renal or ureteral calculi. Electronically Signed   By: Franki Cabot M.D.   On: 07/14/2017 15:58    EKG: not done   Labs on Admission: I have personally reviewed the available labs and imaging studies at the time of the admission.  Pertinent labs:   Glucose 367 BUN 37/Creatinine 1.75/GFR 25 - stable AP 152/AST 72/ALT 108/Bili 2.1 - all normal on 11/8 Normal CBC UA: 50 glucose, moderate LE  Assessment/Plan Principal Problem:   Common bile duct dilatation Active Problems:   Type 2 diabetes mellitus (HCC)   COPD (chronic obstructive pulmonary disease) (HCC)   Glaucoma   Essential hypertension   Elevated LFTs   CKD (chronic kidney disease) stage 3, GFR 30-59 ml/min (HCC)   CBD dilatation with elevated LFTs -Patient presenting with acute on chronic RUQ abdominal pain, found to have elevated LFTs (normal 2 weeks ago) and dilated CBD -This picture is concerning for an obstructing gallstone, but the patient had a very remote cholecystectomy which makes this less likely -Will admit -MRCP tomorrow -Clear liquids tonight, NPO after midnight -GI consult in the AM -If thought to be a retained stone, she is likely to need an MRCP -Pain control with morphine, nausea control with Zofran -Recheck LFTs in the AM - it is possible that this was a transient issue that has passed and the patient may not require further evaluation/intervention if this is the case  DM -Recent A1c 8.0 indicating suboptimal control -Hold  Glipizide -Cover with moderate-scale SSI  Glaucoma -Continue home drops - Trusopt, Alphagan, and Xalatan  HTN -Continue Norvasc  COPD -prn Duonebs -She was previously treated with Levaquin and was discharged on QOD medication for CAP; since her d/c was 9 days ago, there does not appear to be ongoing need for this medication and so will discontinue.  CKD -Stable -Avoid nephrotoxic medications where possible    DVT prophylaxis:  SCDs Code Status:  DNR - confirmed with patient/family Family Communication: Granddaughters present throughout  evaluation  Disposition Plan:  Home once clinically improved Consults called: GI  Admission status: Admit - It is my clinical opinion that admission to INPATIENT is reasonable and necessary because this patient will require at least 2 midnights in the hospital to treat this condition based on the medical complexity of the problems presented.  Given the aforementioned information, the predictability of an adverse outcome is felt to be significant.     Karmen Bongo MD [facility]  If note is complete, please contact covering daytime or nighttime physician. [email or web address] Password [facility]  07/14/2017, 6:53 PM

## 2017-07-14 NOTE — ED Provider Notes (Signed)
[facility] EMERGENCY DEPARTMENT Provider Note   CSN: [id number] Arrival date & time: 07/14/17  1250     History   Chief Complaint Chief Complaint  Patient presents with  . Abdominal Pain    HPI [name] is a 83 y.o. female.  Patient complains of abdominal pain for the last couple days.  Worse in the right lower quadrant and somewhat right upper quadrant   The history is provided by the patient.  Abdominal Pain   This is a new problem. The current episode started 2 days ago. The problem occurs constantly. The pain is associated with an unknown factor. The pain is located in the RUQ and RLQ. The quality of the pain is aching. The pain is at a severity of 6/10.    Past Medical History:  Diagnosis Date  . Acid reflux   . Anxiety   . COPD (chronic obstructive pulmonary disease) ([place])   . Depressed   . Diabetes mellitus   . Glaucoma   . Hypercholesteremia   . Legally blind in right eye, as defined in [place]   . Panic attacks   . Type 2 diabetes mellitus [facility])     Patient Active Problem List   Diagnosis Date Noted  . CAP (community acquired pneumonia) 06/30/2017  . Hypercholesteremia 06/30/2017  . Glaucoma 06/30/2017  . Anxiety 06/30/2017  . Acid reflux 06/30/2017  . Osteoarthritis 07/21/2011  . Altered mental status 07/21/2011  . COPD (chronic obstructive pulmonary disease) ([place]) 07/21/2011  . UTI (lower urinary tract infection) 07/21/2011  . TOTAL HIP FOLLOW-UP 04/14/2008  . HIP PAIN 03/17/2008  . SCIATICA 03/17/2008  . CUBITAL TUNNEL SYNDROME 02/10/2008  . KNEE, ARTHRITIS, DEGEN./OSTEO 02/10/2008  . KNEE PAIN 02/10/2008  . NECK PAIN 12/24/2007  . SHOULDER PAIN 11/17/2007  . RUPTURE ROTATOR CUFF 11/17/2007  . Type 2 diabetes mellitus ([place]) 05/13/2007    Past Surgical History:  Procedure Laterality Date  . HIP SURGERY      OB History    No data available       Home Medications    Prior to Admission medications   Medication Sig Start Date  End Date Taking? Authorizing Provider  amLODipine (NORVASC) 5 MG tablet Take 5 mg by mouth daily.     Yes [provider]  busPIRone (BUSPAR) 5 MG tablet Take 5 mg 2 (two) times daily as needed by mouth.   Yes [provider]  cholecalciferol (VITAMIN D) 1000 units tablet Take 1,000 Units daily by mouth.   Yes [provider]  dorzolamide (TRUSOPT) 2 % ophthalmic solution Place 1 drop into both eyes 2 (two) times daily.     Yes [provider]  furosemide (LASIX) 20 MG tablet Take 20 mg daily as needed by mouth for fluid.   Yes [provider]  glipiZIDE (GLUCOTROL) 5 MG tablet Take 5 mg 2 (two) times daily by mouth.    Yes [provider]  hydrocortisone cream 1 % Apply 2 (two) times daily topically. 07/05/17  Yes Sinda Du, MD  omeprazole (PRILOSEC) 20 MG capsule Take 20 mg by mouth daily.     Yes [provider]  pravastatin (PRAVACHOL) 40 MG tablet Take 40 mg by mouth daily.     Yes [provider]  traMADol (ULTRAM) 50 MG tablet Take 1 tablet (50 mg total) by mouth every 6 (six) hours as needed for moderate pain. 04/05/15  Yes Dorie Rank, MD  acetaminophen (TYLENOL) 500 MG tablet Take 500  mg by mouth every 6 (six) hours as needed for mild pain or moderate pain.    [provider]  brimonidine (ALPHAGAN P) 0.1 % SOLN Place 1 drop into both eyes 2 (two) times daily.      [provider]  ipratropium-albuterol (DUONEB) 0.5-2.5 (3) MG/3ML SOLN Take 3 mLs by nebulization every 4 (four) hours as needed. Shortness of Breath    [provider]  latanoprost (XALATAN) 0.005 % ophthalmic solution Place 1 drop into both eyes at bedtime.      [provider]  levofloxacin (LEVAQUIN) 500 MG tablet Take 1 tablet (500 mg total) every other day by mouth. 07/06/17   Sinda Du, MD  oxyCODONE-acetaminophen (PERCOCET/ROXICET) 5-325 MG per tablet Take 1 tablet by mouth every 6 (six) hours as needed for  severe pain. Patient not taking: Reported on 07/14/2017 04/06/15   Rolland Porter, MD  predniSONE (STERAPRED UNI-PAK 21 TAB) 10 MG (21) TBPK tablet Take by package directions Patient not taking: Reported on 07/14/2017 07/05/17   Sinda Du, MD  valACYclovir (VALTREX) 1000 MG tablet Take 1 tablet (1,000 mg total) 2 (two) times daily by mouth. Patient not taking: Reported on 07/14/2017 07/05/17   Sinda Du, MD    Family History Family History  Problem Relation Age of Onset  . Blindness Father   . Brain cancer Sister   . Alzheimer's disease Brother     Social History Social History   Tobacco Use  . Smoking status: Never Smoker  . Smokeless tobacco: Never Used  Substance Use Topics  . Alcohol use: No  . Drug use: No     Allergies   Hydromorphone hcl   Review of Systems Review of Systems  Gastrointestinal: Positive for abdominal pain.     Physical Exam Updated Vital Signs BP 132/80 (BP Location: Left Arm)   Pulse 74   Temp 99 F (37.2 C) (Oral)   Resp 16   Ht 5\' 3"  (1.6 m)   Wt 81.2 kg (179 lb)   SpO2 95%   BMI 31.71 kg/m   Physical Exam  Constitutional: She is oriented to person, place, and time. She appears well-developed.  HENT:  Head: Normocephalic.  Eyes: Conjunctivae and EOM are normal. No scleral icterus.  Neck: Neck supple. No thyromegaly present.  Cardiovascular: Normal rate and regular rhythm. Exam reveals no gallop and no friction rub.  No murmur heard. Pulmonary/Chest: No stridor. She has no wheezes. She has no rales. She exhibits no tenderness.  Abdominal: She exhibits no distension. There is tenderness. There is no rebound.  Musculoskeletal: Normal range of motion. She exhibits no edema.  Lymphadenopathy:    She has no cervical adenopathy.  Neurological: She is oriented to person, place, and time. She exhibits normal muscle tone. Coordination normal.  Skin: No rash noted. No erythema.  Psychiatric: She has a normal mood and affect. Her  behavior is normal.  Nursing note and vitals reviewed.    ED Treatments / Results  Labs (all labs ordered are listed, but only abnormal results are displayed) Labs Reviewed  CBC WITH DIFFERENTIAL/PLATELET - Abnormal; Notable for the following components:      Result Value   Neutro Abs 8.9 (*)    All other components within normal limits  COMPREHENSIVE METABOLIC PANEL - Abnormal; Notable for the following components:   Sodium 134 (*)    Glucose, Bld 367 (*)    BUN 37 (*)    Creatinine, Ser 1.75 (*)    Calcium 8.6 (*)  Total Protein 5.7 (*)    Albumin 3.3 (*)    AST 72 (*)    ALT 108 (*)    Alkaline Phosphatase 152 (*)    Total Bilirubin 2.1 (*)    GFR calc non Af Amer 25 (*)    GFR calc Af Amer 29 (*)    All other components within normal limits  URINALYSIS, DIPSTICK ONLY - Abnormal; Notable for the following components:   Color, Urine AMBER (*)    APPearance HAZY (*)    Glucose, UA 50 (*)    Leukocytes, UA MODERATE (*)    All other components within normal limits  LIPASE, BLOOD    EKG  EKG Interpretation None       Radiology Ct Abdomen Pelvis Wo Contrast  Result Date: 07/14/2017 CLINICAL DATA:  Epigastric, periumbilical and right lower quadrant pain for 2 days. EXAM: CT ABDOMEN AND PELVIS WITHOUT CONTRAST TECHNIQUE: Multidetector CT imaging of the abdomen and pelvis was performed following the standard protocol without IV contrast. COMPARISON:  CT abdomen dated 03/30/2005. FINDINGS: Lower chest: No acute findings. Mild focal scarring/atelectasis at the right lung base. Hepatobiliary: No focal liver abnormality is seen. There is marked common bile duct dilatation, measuring up to approximately 2.5 cm. Gallbladder is not seen, presumed cholecystectomy. Pancreas: Fatty infiltration of the pancreas. Otherwise unremarkable. Spleen: Normal in size without focal abnormality. Adrenals/Urinary Tract: Adrenal glands are unremarkable. Parapelvic renal cysts bilaterally. No  renal stone or hydronephrosis. No ureteral or bladder calculi identified. Bladder is unremarkable, partially decompressed. Stomach/Bowel: Extensive diverticulosis throughout the colon but no focal inflammatory change to suggest acute diverticulitis. Stomach is unremarkable. No dilated large or small bowel loops. Appendix is not seen but there are no inflammatory changes about the cecum to suggest acute appendicitis. Vascular/Lymphatic: Aortic atherosclerosis. No enlarged lymph nodes seen. Reproductive: Presumed hysterectomy. No adnexal mass or inflammatory change. Other: No free fluid or abscess collection. No free intraperitoneal air. Musculoskeletal: Mild degenerative change of the slightly scoliotic thoracolumbar spine. No acute or suspicious osseous finding. Superficial soft tissues are unremarkable. IMPRESSION: 1. Marked common bile duct dilatation, measuring up to 2.5 cm diameter. No obstructing stone identified. Recommend correlation with liver function tests. Consider MRCP for further characterization. 2. Extensive colonic diverticulosis without evidence of acute diverticulitis. 3. Aortic atherosclerosis. 4. Remainder of the abdomen and pelvis is unremarkable, as detailed above. No bowel obstruction or evidence of bowel wall inflammation. No free fluid. No evidence of acute solid organ abnormality. No renal or ureteral calculi. Electronically Signed   By: Franki Cabot M.D.   On: 07/14/2017 15:58    Procedures Procedures (including critical care time)  Medications Ordered in ED Medications  ondansetron (ZOFRAN) injection 4 mg (4 mg Intravenous Given 07/14/17 1334)  morphine 4 MG/ML injection 4 mg (4 mg Intravenous Given 07/14/17 1334)  sodium chloride 0.9 % bolus 500 mL (0 mLs Intravenous Stopped 07/14/17 1631)     Initial Impression / Assessment and Plan / ED Course  I have reviewed the triage vital signs and the nursing notes.  Pertinent labs & imaging results that were available during my  care of the patient were reviewed by me and considered in my medical decision making (see chart for details).     Patient with abdominal pain and and CT showing dilated common bile duct.  Patient will be admitted to medicine and GI will consult in the morning.  She will get an MRCP tomorrow  Final Clinical Impressions(s) / ED Diagnoses  Final diagnoses:  Pain of upper abdomen    ED Discharge Orders    None       Milton Ferguson, MD 07/14/17 [phone]

## 2017-07-14 NOTE — ED Triage Notes (Addendum)
Pt recently admitted here for PNA, d/c on 07/05/17. Pt reports intermittent right side and epigastric abd pain since d/c that continues. Pt denies fever/ nausea/vomting. Last BM yesterday and reported as normal. Recently finished antibiotic rx and is still on steroids for PNA

## 2017-07-15 ENCOUNTER — Inpatient Hospital Stay (HOSPITAL_COMMUNITY): Payer: Medicare Other

## 2017-07-15 ENCOUNTER — Encounter (HOSPITAL_COMMUNITY): Payer: Self-pay | Admitting: Gastroenterology

## 2017-07-15 LAB — GLUCOSE, CAPILLARY
GLUCOSE-CAPILLARY: 149 mg/dL — AB (ref 65–99)
Glucose-Capillary: 207 mg/dL — ABNORMAL HIGH (ref 65–99)
Glucose-Capillary: 189 mg/dL — ABNORMAL HIGH (ref 65–99)
Glucose-Capillary: 119 mg/dL — ABNORMAL HIGH (ref 65–99)
Glucose-Capillary: 187 mg/dL — ABNORMAL HIGH (ref 65–99)

## 2017-07-15 LAB — CBC
HCT: 37.5 % (ref 36.0–46.0)
HEMOGLOBIN: 12.5 g/dL (ref 12.0–15.0)
MCH: 31.7 pg (ref 26.0–34.0)
MCHC: 33.3 g/dL (ref 30.0–36.0)
MCV: 95.2 fL (ref 78.0–100.0)
Platelets: 164 10*3/uL (ref 150–400)
RBC: 3.94 MIL/uL (ref 3.87–5.11)
RDW: 13.9 % (ref 11.5–15.5)
WBC: 9.6 10*3/uL (ref 4.0–10.5)

## 2017-07-15 LAB — COMPREHENSIVE METABOLIC PANEL
ALK PHOS: 123 U/L (ref 38–126)
ALT: 77 U/L — AB (ref 14–54)
AST: 35 U/L (ref 15–41)
Albumin: 3.1 g/dL — ABNORMAL LOW (ref 3.5–5.0)
Anion gap: 9 (ref 5–15)
BUN: 40 mg/dL — AB (ref 6–20)
CALCIUM: 8.3 mg/dL — AB (ref 8.9–10.3)
CO2: 25 mmol/L (ref 22–32)
CREATININE: 1.85 mg/dL — AB (ref 0.44–1.00)
Chloride: 102 mmol/L (ref 101–111)
GFR, EST AFRICAN AMERICAN: 27 mL/min — AB (ref >60)
GFR, EST NON AFRICAN AMERICAN: 24 mL/min — AB (ref >60)
Glucose, Bld: 193 mg/dL — ABNORMAL HIGH (ref 65–99)
Potassium: 3.8 mmol/L (ref 3.5–5.1)
SODIUM: 136 mmol/L (ref 135–145)
Total Bilirubin: 1.7 mg/dL — ABNORMAL HIGH (ref 0.3–1.2)
Total Protein: 5.5 g/dL — ABNORMAL LOW (ref 6.5–8.1)

## 2017-07-15 MED ORDER — SODIUM CHLORIDE 0.9 % IV SOLN
1.5000 g | INTRAVENOUS | Status: AC
  Administered 2017-07-16: 1.5 g via INTRAVENOUS
  Filled 2017-07-15: qty 1.5

## 2017-07-15 NOTE — Progress Notes (Signed)
CT chest reviewed that was ordered for question of dissection of distal thoracic aorta, age undetermined. This showed no obvious dissection. Repeat non-contrast CT chest was recommended in 3 months due to patchy nodular opacity in right lower lobe. ERCP will be scheduled for 11/20 at noon with Dr. Oneida Alar. Unasyn 1.5 grams IV on call for ERCP. NPO after midnight.

## 2017-07-15 NOTE — Progress Notes (Signed)
Subjective: She was admitted with abdominal pain and common bile duct enlargement on CT.  Her liver enzymes were elevated as well.  She says she feels better.  Her abdominal pain  Objective: Vital signs in last 24 hours: Temp:  [97.8 F (36.6 C)-99 F (37.2 C)] 97.8 F (36.6 C) (11/19 0424) Pulse Rate:  [74-105] 78 (11/19 0424) Resp:  [16-20] 20 (11/19 0424) BP: (97-132)/(51-80) 100/61 (11/19 0424) SpO2:  [92 %-100 %] 92 % (11/19 0600) Weight:  [79.2 kg (174 lb 9.7 oz)-81.2 kg (179 lb)] 79.2 kg (174 lb 9.7 oz) (11/18 1900) Weight change:  Last BM Date: 07/13/17  Intake/Output from previous day: 11/18 0701 - 11/19 0700 In: 500 [IV Piggyback:500] Out: -   PHYSICAL EXAM General appearance: alert, cooperative and mild distress Resp: rhonchi bilaterally Cardio: regular rate and rhythm, S1, S2 normal, no murmur, click, rub or gallop GI: Minimal right upper quadrant tenderness Extremities: extremities normal, atraumatic, no cyanosis or edema Skin warm and dry  Lab Results:  Results for orders placed or performed during the hospital encounter of 07/14/17 (from the past 48 hour(s))  CBC with Differential/Platelet     Status: Abnormal   Collection Time: 07/14/17  1:11 PM  Result Value Ref Range   WBC 10.5 4.0 - 10.5 K/uL   RBC 4.39 3.87 - 5.11 MIL/uL   Hemoglobin 13.7 12.0 - 15.0 g/dL   HCT 40.4 36.0 - 46.0 %   MCV 92.0 78.0 - 100.0 fL   MCH 31.2 26.0 - 34.0 pg   MCHC 33.9 30.0 - 36.0 g/dL   RDW 13.7 11.5 - 15.5 %   Platelets 163 150 - 400 K/uL   Neutrophils Relative % 85 %   Neutro Abs 8.9 (H) 1.7 - 7.7 K/uL   Lymphocytes Relative 8 %   Lymphs Abs 0.9 0.7 - 4.0 K/uL   Monocytes Relative 7 %   Monocytes Absolute 0.8 0.1 - 1.0 K/uL   Eosinophils Relative 0 %   Eosinophils Absolute 0.0 0.0 - 0.7 K/uL   Basophils Relative 0 %   Basophils Absolute 0.0 0.0 - 0.1 K/uL   WBC Morphology HYPERSEGMENTED NEUT     Comment: ATYPICAL LYMPHOCYTES  Comprehensive metabolic panel      Status: Abnormal   Collection Time: 07/14/17  1:11 PM  Result Value Ref Range   Sodium 134 (L) 135 - 145 mmol/L   Potassium 4.0 3.5 - 5.1 mmol/L   Chloride 103 101 - 111 mmol/L   CO2 22 22 - 32 mmol/L   Glucose, Bld 367 (H) 65 - 99 mg/dL   BUN 37 (H) 6 - 20 mg/dL   Creatinine, Ser 1.75 (H) 0.44 - 1.00 mg/dL   Calcium 8.6 (L) 8.9 - 10.3 mg/dL   Total Protein 5.7 (L) 6.5 - 8.1 g/dL   Albumin 3.3 (L) 3.5 - 5.0 g/dL   AST 72 (H) 15 - 41 U/L   ALT 108 (H) 14 - 54 U/L   Alkaline Phosphatase 152 (H) 38 - 126 U/L   Total Bilirubin 2.1 (H) 0.3 - 1.2 mg/dL   GFR calc non Af Amer 25 (L) >60 mL/min   GFR calc Af Amer 29 (L) >60 mL/min    Comment: (NOTE) The eGFR has been calculated using the CKD EPI equation. This calculation has not been validated in all clinical situations. eGFR's persistently <60 mL/min signify possible Chronic Kidney Disease.    Anion gap 9 5 - 15  Urinalysis, dipstick only  Status: Abnormal   Collection Time: 07/14/17  1:11 PM  Result Value Ref Range   Color, Urine AMBER (A) YELLOW    Comment: BIOCHEMICALS MAY BE AFFECTED BY COLOR   APPearance HAZY (A) CLEAR   Specific Gravity, Urine 1.019 1.005 - 1.030   pH 5.0 5.0 - 8.0   Glucose, UA 50 (A) NEGATIVE mg/dL   Hgb urine dipstick NEGATIVE NEGATIVE   Bilirubin Urine NEGATIVE NEGATIVE   Ketones, ur NEGATIVE NEGATIVE mg/dL   Protein, ur NEGATIVE NEGATIVE mg/dL   Nitrite NEGATIVE NEGATIVE   Leukocytes, UA MODERATE (A) NEGATIVE  Lipase, blood     Status: None   Collection Time: 07/14/17  1:11 PM  Result Value Ref Range   Lipase 18 11 - 51 U/L  Glucose, capillary     Status: Abnormal   Collection Time: 07/14/17  9:39 PM  Result Value Ref Range   Glucose-Capillary 299 (H) 65 - 99 mg/dL   Comment 1 Notify RN    Comment 2 Document in Chart   Glucose, capillary     Status: Abnormal   Collection Time: 07/15/17  4:18 AM  Result Value Ref Range   Glucose-Capillary 207 (H) 65 - 99 mg/dL   Comment 1 Notify RN     Comment 2 Document in Chart   CBC     Status: None   Collection Time: 07/15/17  6:14 AM  Result Value Ref Range   WBC 9.6 4.0 - 10.5 K/uL   RBC 3.94 3.87 - 5.11 MIL/uL   Hemoglobin 12.5 12.0 - 15.0 g/dL   HCT 37.5 36.0 - 46.0 %   MCV 95.2 78.0 - 100.0 fL   MCH 31.7 26.0 - 34.0 pg   MCHC 33.3 30.0 - 36.0 g/dL   RDW 13.9 11.5 - 15.5 %   Platelets 164 150 - 400 K/uL  Comprehensive metabolic panel     Status: Abnormal   Collection Time: 07/15/17  6:14 AM  Result Value Ref Range   Sodium 136 135 - 145 mmol/L   Potassium 3.8 3.5 - 5.1 mmol/L   Chloride 102 101 - 111 mmol/L   CO2 25 22 - 32 mmol/L   Glucose, Bld 193 (H) 65 - 99 mg/dL   BUN 40 (H) 6 - 20 mg/dL   Creatinine, Ser 1.85 (H) 0.44 - 1.00 mg/dL   Calcium 8.3 (L) 8.9 - 10.3 mg/dL   Total Protein 5.5 (L) 6.5 - 8.1 g/dL   Albumin 3.1 (L) 3.5 - 5.0 g/dL   AST 35 15 - 41 U/L   ALT 77 (H) 14 - 54 U/L   Alkaline Phosphatase 123 38 - 126 U/L   Total Bilirubin 1.7 (H) 0.3 - 1.2 mg/dL   GFR calc non Af Amer 24 (L) >60 mL/min   GFR calc Af Amer 27 (L) >60 mL/min    Comment: (NOTE) The eGFR has been calculated using the CKD EPI equation. This calculation has not been validated in all clinical situations. eGFR's persistently <60 mL/min signify possible Chronic Kidney Disease.    Anion gap 9 5 - 15  Glucose, capillary     Status: Abnormal   Collection Time: 07/15/17  7:45 AM  Result Value Ref Range   Glucose-Capillary 189 (H) 65 - 99 mg/dL    ABGS No results for input(s): PHART, PO2ART, TCO2, HCO3 in the last 72 hours.  Invalid input(s): PCO2 CULTURES No results found for this or any previous visit (from the past 240 hour(s)). Studies/Results: Ct Abdomen Pelvis  Wo Contrast  Result Date: 07/14/2017 CLINICAL DATA:  Epigastric, periumbilical and right lower quadrant pain for 2 days. EXAM: CT ABDOMEN AND PELVIS WITHOUT CONTRAST TECHNIQUE: Multidetector CT imaging of the abdomen and pelvis was performed following the standard  protocol without IV contrast. COMPARISON:  CT abdomen dated 03/30/2005. FINDINGS: Lower chest: No acute findings. Mild focal scarring/atelectasis at the right lung base. Hepatobiliary: No focal liver abnormality is seen. There is marked common bile duct dilatation, measuring up to approximately 2.5 cm. Gallbladder is not seen, presumed cholecystectomy. Pancreas: Fatty infiltration of the pancreas. Otherwise unremarkable. Spleen: Normal in size without focal abnormality. Adrenals/Urinary Tract: Adrenal glands are unremarkable. Parapelvic renal cysts bilaterally. No renal stone or hydronephrosis. No ureteral or bladder calculi identified. Bladder is unremarkable, partially decompressed. Stomach/Bowel: Extensive diverticulosis throughout the colon but no focal inflammatory change to suggest acute diverticulitis. Stomach is unremarkable. No dilated large or small bowel loops. Appendix is not seen but there are no inflammatory changes about the cecum to suggest acute appendicitis. Vascular/Lymphatic: Aortic atherosclerosis. No enlarged lymph nodes seen. Reproductive: Presumed hysterectomy. No adnexal mass or inflammatory change. Other: No free fluid or abscess collection. No free intraperitoneal air. Musculoskeletal: Mild degenerative change of the slightly scoliotic thoracolumbar spine. No acute or suspicious osseous finding. Superficial soft tissues are unremarkable. IMPRESSION: 1. Marked common bile duct dilatation, measuring up to 2.5 cm diameter. No obstructing stone identified. Recommend correlation with liver function tests. Consider MRCP for further characterization. 2. Extensive colonic diverticulosis without evidence of acute diverticulitis. 3. Aortic atherosclerosis. 4. Remainder of the abdomen and pelvis is unremarkable, as detailed above. No bowel obstruction or evidence of bowel wall inflammation. No free fluid. No evidence of acute solid organ abnormality. No renal or ureteral calculi. Electronically  Signed   By: Franki Cabot M.D.   On: 07/14/2017 15:58    Medications:  Prior to Admission:  Medications Prior to Admission  Medication Sig Dispense Refill Last Dose  . amLODipine (NORVASC) 5 MG tablet Take 5 mg by mouth daily.     07/14/2017 at Unknown time  . busPIRone (BUSPAR) 5 MG tablet Take 5 mg 2 (two) times daily as needed by mouth.   07/13/2017 at Unknown time  . cholecalciferol (VITAMIN D) 1000 units tablet Take 1,000 Units daily by mouth.   07/14/2017 at Unknown time  . dorzolamide (TRUSOPT) 2 % ophthalmic solution Place 1 drop into both eyes 2 (two) times daily.     07/14/2017 at Unknown time  . furosemide (LASIX) 20 MG tablet Take 20 mg daily as needed by mouth for fluid.   Past Month at Unknown time  . glipiZIDE (GLUCOTROL) 5 MG tablet Take 5 mg 2 (two) times daily by mouth.    07/14/2017 at Unknown time  . hydrocortisone cream 1 % Apply 2 (two) times daily topically. 30 g 0 Past Week at Unknown time  . omeprazole (PRILOSEC) 20 MG capsule Take 20 mg by mouth daily.     07/14/2017 at Unknown time  . pravastatin (PRAVACHOL) 40 MG tablet Take 40 mg by mouth daily.     07/14/2017 at Unknown time  . traMADol (ULTRAM) 50 MG tablet Take 1 tablet (50 mg total) by mouth every 6 (six) hours as needed for moderate pain. 30 tablet 0 07/13/2017 at Unknown time  . acetaminophen (TYLENOL) 500 MG tablet Take 500 mg by mouth every 6 (six) hours as needed for mild pain or moderate pain.   Not Taking at Unknown time  . brimonidine (ALPHAGAN  P) 0.1 % SOLN Place 1 drop into both eyes 2 (two) times daily.     Not Taking at Unknown time  . ipratropium-albuterol (DUONEB) 0.5-2.5 (3) MG/3ML SOLN Take 3 mLs by nebulization every 4 (four) hours as needed. Shortness of Breath   06/30/2017 at Unknown time  . latanoprost (XALATAN) 0.005 % ophthalmic solution Place 1 drop into both eyes at bedtime.     Not Taking at Unknown time  . levofloxacin (LEVAQUIN) 500 MG tablet Take 1 tablet (500 mg total) every other day  by mouth. 5 tablet 0 07/12/2017  . oxyCODONE-acetaminophen (PERCOCET/ROXICET) 5-325 MG per tablet Take 1 tablet by mouth every 6 (six) hours as needed for severe pain. (Patient not taking: Reported on 07/14/2017) 20 tablet 0 Not Taking   Scheduled: . amLODipine  5 mg Oral Daily  . brimonidine  1 drop Both Eyes BID  . dorzolamide  1 drop Both Eyes BID  . insulin aspart  0-15 Units Subcutaneous TID WC  . insulin aspart  0-5 Units Subcutaneous QHS  . latanoprost  1 drop Both Eyes QHS  . LORazepam  1 mg Intravenous Once  . mouth rinse  15 mL Mouth Rinse BID  . pantoprazole  40 mg Oral Daily  . pravastatin  40 mg Oral Daily   Continuous:  ZCH:YIFOYDXAJOINO **OR** acetaminophen, busPIRone, ipratropium-albuterol, morphine injection, ondansetron **OR** ondansetron (ZOFRAN) IV  Assesment: She was admitted with abdominal pain, common bile duct dilatation and elevated liver function testing.  She has chronic kidney disease so her MRCP is going to have to be done without contrast.  She says she feels better.  She is not having any pain now.  Her enzymes are better.  I suspect that she had a common bile duct stone and may have passed it.  At baseline she has COPD and just had a hospitalization for pneumonia but she seems to be totally over that now. Principal Problem:   Common bile duct dilatation Active Problems:   Type 2 diabetes mellitus (HCC)   COPD (chronic obstructive pulmonary disease) (HCC)   Glaucoma   Essential hypertension   Elevated LFTs   CKD (chronic kidney disease) stage 3, GFR 30-59 ml/min (HCC)    Plan: For MRCP.  For GI consultation.  Continue other treatments.    LOS: 1 day   Zakeria Kulzer L 07/15/2017, 8:18 AM

## 2017-07-15 NOTE — Progress Notes (Signed)
MRCP reviewed. Tubular filling defect in the CBD, likely combination of gallstones and sludge. ERCP will need to be completed. 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 I have ordered a non-contrast chest CT to further evaluate this. ERCP tentatively Tuesday or Wednesday here at [facility]. Chest CT today. [name], aware and will notify patient. May have clears today.

## 2017-07-15 NOTE — Consult Note (Signed)
Referring Provider: Dr. Lorin Mercy  Primary Care Physician:  Sinda Du, MD Primary Gastroenterologist:  Dr. Gala Romney   Date of Admission: 07/14/17 Date of Consultation: 07/15/17  Reason for Consultation: Elevated LFTs, dilated CBD   HPI:  [name] is an 83 y.o. year old female presenting with week long history of right-sided abdominal pain, worsening and prompting presentation to ED. Noted to have new onset elevated LFTs, which have improved this morning significantly. No evidence of leukocytosis. Due to renal function, CT abd/pelvis without contrast noted marked CBD dilatation measuring up to 2.5 cm without obvious obstructing stone. She will be undergoing an MRCP shortly within the next hour.   She notes a week long history of RLQ/RUQ (worse in RUQ) pain that has been intermittent. Sometimes associated with eating, sometimes not. Waxing and waning. No associated nausea, fever, or chills. No dysphagia. She denies any chronic history of right-sided abdominal pain but does state her appetite has been waxing and waning for the past few months. Notes sometimes just looking at her food makes her feel sick. Denies any unexplained weight loss.   As for other GI history, she reports a remote colonoscopy by Dr. Gala Romney. EGD was completed in 2006 with normal esophagus s/p empiric dilatation, small hiatal hernia.   Her daughter-in-law is at the bedside. Patient is tearful today, as her daughter remains at home with her husband who is not expected to live much longer. Patient states she lives with her son.   Past Medical History:  Diagnosis Date  . Acid reflux   . Anxiety   . CKD (chronic kidney disease)   . COPD (chronic obstructive pulmonary disease) ([place])   . Depressed   . Diabetes mellitus   . Glaucoma   . Hypercholesteremia   . Hypertension   . Legally blind in right eye, as defined in [place]   . Panic attacks   . Type 2 diabetes mellitus ([place])     Past Surgical History:  Procedure  Laterality Date  . APPENDECTOMY    . cataracts    . CHOLECYSTECTOMY    . ESOPHAGOGASTRODUODENOSCOPY  2006   Dr. Gala Romney: normal esophagus s/p empiric dilataton, small hiatal hernia  . HIP SURGERY     rod in leg as well  . TUBAL LIGATION      Prior to Admission medications   Medication Sig Start Date End Date Taking? Authorizing Provider  amLODipine (NORVASC) 5 MG tablet Take 5 mg by mouth daily.     Yes [provider]  busPIRone (BUSPAR) 5 MG tablet Take 5 mg 2 (two) times daily as needed by mouth.   Yes [provider]  cholecalciferol (VITAMIN D) 1000 units tablet Take 1,000 Units daily by mouth.   Yes [provider]  dorzolamide (TRUSOPT) 2 % ophthalmic solution Place 1 drop into both eyes 2 (two) times daily.     Yes [provider]  furosemide (LASIX) 20 MG tablet Take 20 mg daily as needed by mouth for fluid.   Yes [provider]  glipiZIDE (GLUCOTROL) 5 MG tablet Take 5 mg 2 (two) times daily by mouth.    Yes [provider]  hydrocortisone cream 1 % Apply 2 (two) times daily topically. 07/05/17  Yes Sinda Du, MD  omeprazole (PRILOSEC) 20 MG capsule Take 20 mg by mouth daily.     Yes [provider]  pravastatin (PRAVACHOL) 40 MG tablet Take 40 mg by mouth daily.     Yes [provider]  traMADol (ULTRAM) 50 MG tablet Take 1 tablet (50 mg total) by mouth every 6 (six) hours as needed for moderate pain. 04/05/15  Yes Dorie Rank, MD  acetaminophen (TYLENOL) 500 MG tablet Take 500 mg by mouth every 6 (six) hours as needed for mild pain or moderate pain.    [provider]  brimonidine (ALPHAGAN P) 0.1 % SOLN Place 1 drop into both eyes 2 (two) times daily.      [provider]  ipratropium-albuterol (DUONEB) 0.5-2.5 (3) MG/3ML SOLN Take 3 mLs by nebulization every 4 (four) hours as needed. Shortness of Breath    [provider]  latanoprost (XALATAN) 0.005 % ophthalmic solution Place  1 drop into both eyes at bedtime.      [provider]  levofloxacin (LEVAQUIN) 500 MG tablet Take 1 tablet (500 mg total) every other day by mouth. 07/06/17   Sinda Du, MD  oxyCODONE-acetaminophen (PERCOCET/ROXICET) 5-325 MG per tablet Take 1 tablet by mouth every 6 (six) hours as needed for severe pain. Patient not taking: Reported on 07/14/2017 04/06/15   Rolland Porter, MD    Current Facility-Administered Medications  Medication Dose Route Frequency Provider Last Rate Last Dose  . acetaminophen (TYLENOL) tablet 650 mg  650 mg Oral Q6H PRN Karmen Bongo, MD       Or  . acetaminophen (TYLENOL) suppository 650 mg  650 mg Rectal Q6H PRN Karmen Bongo, MD      . amLODipine (NORVASC) tablet 5 mg  5 mg Oral Daily Karmen Bongo, MD      . brimonidine (ALPHAGAN) 0.15 % ophthalmic solution 1 drop  1 drop Both Eyes BID Karmen Bongo, MD      . busPIRone (BUSPAR) tablet 5 mg  5 mg Oral BID PRN Karmen Bongo, MD      . dorzolamide (TRUSOPT) 2 % ophthalmic solution 1 drop  1 drop Both Eyes BID Karmen Bongo, MD      . insulin aspart (novoLOG) injection 0-15 Units  0-15 Units Subcutaneous TID WC Karmen Bongo, MD   3 Units at 07/15/17 [phone]  . insulin aspart (novoLOG) injection 0-5 Units  0-5 Units Subcutaneous QHS Karmen Bongo, MD   3 Units at 07/14/17 2146  . ipratropium-albuterol (DUONEB) 0.5-2.5 (3) MG/3ML nebulizer solution 3 mL  3 mL Nebulization Q4H PRN Karmen Bongo, MD      . latanoprost (XALATAN) 0.005 % ophthalmic solution 1 drop  1 drop Both Eyes QHS Karmen Bongo, MD      . LORazepam (ATIVAN) injection 1 mg  1 mg Intravenous Once Karmen Bongo, MD      . MEDLINE mouth rinse  15 mL Mouth Rinse BID Karmen Bongo, MD      . morphine 2 MG/ML injection 2 mg  2 mg Intravenous Q2H PRN Karmen Bongo, MD   2 mg at 07/15/17 3532  . ondansetron (ZOFRAN) tablet 4 mg  4 mg Oral Q6H PRN Karmen Bongo, MD       Or  . ondansetron [facility]) injection 4 mg  4 mg  Intravenous Q6H PRN Karmen Bongo, MD      . pantoprazole (PROTONIX) EC tablet 40 mg  40 mg Oral Daily Karmen Bongo, MD      . pravastatin (PRAVACHOL) tablet 40 mg  40 mg Oral Daily Karmen Bongo, MD        Allergies as of 07/14/2017 - Review Complete 07/14/2017  Allergen Reaction Noted  . Hydromorphone hcl Other (See Comments)     Family History  Problem Relation Age of Onset  . Blindness Father   . Bone cancer Father   . Brain cancer Sister   . Alzheimer's disease Brother   . Colon cancer Neg Hx     Social History   Socioeconomic History  . Marital status: Divorced    Spouse name: Not on file  . Number of children: Not on file  . Years of education: Not on file  . Highest education level: Not on file  Social Needs  . Financial resource strain: Not on file  . Food insecurity - worry: Not on file  . Food insecurity - inability: Not on file  . Transportation needs - medical: Not on file  . Transportation needs - non-medical: Not on file  Occupational History  . Not on file  Tobacco Use  . Smoking status: Never Smoker  . Smokeless tobacco: Never Used  Substance and Sexual Activity  . Alcohol use: No  . Drug use: No  . Sexual activity: No  Other Topics Concern  . Not on file  Social History Narrative  . Not on file    Review of Systems: Gen: see HPI CV: Denies chest pain, heart palpitations, syncope, edema  Resp: Denies shortness of breath with rest, cough, wheezing GI: see HP I  GU : Denies urinary burning, urinary frequency, urinary incontinence.  MS: Denies joint pain,swelling, cramping Derm: Denies rash, itching, dry skin Psych: Denies depression, anxiety,confusion, or memory loss Heme: see HPI   Physical Exam: Vital signs in last 24 hours: Temp:  [97.8 F (36.6 C)-99 F (37.2 C)] 97.8 F (36.6 C) (11/19 0424) Pulse Rate:  [74-105] 78 (11/19 0424) Resp:  [16-20] 20 (11/19 0424) BP: (97-132)/(51-80) 100/61 (11/19 0424) SpO2:  [92 %-100 %] 92  % (11/19 0600) Weight:  [174 lb 9.7 oz (79.2 kg)-179 lb (81.2 kg)] 174 lb 9.7 oz (79.2 kg) (11/18 1900) Last BM Date: 07/13/17 General:   Alert,  Well-developed, well-nourished, pleasant and cooperative in NAD. Tearful during visit.  Head:  Normocephalic and atraumatic. Eyes:  Sclera clear, no icterus.   Conjunctiva pink. Ears:  Normal auditory acuity. Nose:  No deformity, discharge,  or lesions. Mouth:  No deformity or lesions, dentition normal. Lungs:  Clear throughout to auscultation.   Heart:  S1 S2 present without obvious murmurs  Abdomen:  Soft, TTP RUQ moreso than RLQ,TTP epigastric, and nondistended. No masses, hepatosplenomegaly or hernias noted. Normal bowel sounds, without guarding, and without rebound.   Rectal:  Deferred  Msk:  Symmetrical without gross deformities. Normal posture. Extremities:  Without edema. Neurologic:  Alert and  oriented x4 Psych:  Alert and cooperative. Normal mood and affect.  Intake/Output from previous day: 11/18 0701 - 11/19 0700 In: 500 [IV Piggyback:500] Out: -  Intake/Output this shift: No intake/output data recorded.  Lab Results: Recent Labs    07/14/17 1311 07/15/17 0614  WBC 10.5 9.6  HGB 13.7 12.5  HCT 40.4 37.5  PLT 163 164   BMET Recent Labs    07/14/17 1311 07/15/17 0614  NA 134* 136  K 4.0 3.8  CL 103 102  CO2 22 25  GLUCOSE 367* 193*  BUN 37* 40*  CREATININE 1.75* 1.85*  CALCIUM 8.6* 8.3*   LFT Recent Labs    07/14/17 1311 07/15/17 0614  PROT 5.7* 5.5*  ALBUMIN 3.3* 3.1*  AST 72* 35  ALT 108* 77*  ALKPHOS 152* 123  BILITOT 2.1* 1.7*   Lab Results  Component Value Date   LIPASE 18  07/14/2017     Studies/Results: Ct Abdomen Pelvis Wo Contrast  Result Date: 07/14/2017 CLINICAL DATA:  Epigastric, periumbilical and right lower quadrant pain for 2 days. EXAM: CT ABDOMEN AND PELVIS WITHOUT CONTRAST TECHNIQUE: Multidetector CT imaging of the abdomen and pelvis was performed following the standard  protocol without IV contrast. COMPARISON:  CT abdomen dated 03/30/2005. FINDINGS: Lower chest: No acute findings. Mild focal scarring/atelectasis at the right lung base. Hepatobiliary: No focal liver abnormality is seen. There is marked common bile duct dilatation, measuring up to approximately 2.5 cm. Gallbladder is not seen, presumed cholecystectomy. Pancreas: Fatty infiltration of the pancreas. Otherwise unremarkable. Spleen: Normal in size without focal abnormality. Adrenals/Urinary Tract: Adrenal glands are unremarkable. Parapelvic renal cysts bilaterally. No renal stone or hydronephrosis. No ureteral or bladder calculi identified. Bladder is unremarkable, partially decompressed. Stomach/Bowel: Extensive diverticulosis throughout the colon but no focal inflammatory change to suggest acute diverticulitis. Stomach is unremarkable. No dilated large or small bowel loops. Appendix is not seen but there are no inflammatory changes about the cecum to suggest acute appendicitis. Vascular/Lymphatic: Aortic atherosclerosis. No enlarged lymph nodes seen. Reproductive: Presumed hysterectomy. No adnexal mass or inflammatory change. Other: No free fluid or abscess collection. No free intraperitoneal air. Musculoskeletal: Mild degenerative change of the slightly scoliotic thoracolumbar spine. No acute or suspicious osseous finding. Superficial soft tissues are unremarkable. IMPRESSION: 1. Marked common bile duct dilatation, measuring up to 2.5 cm diameter. No obstructing stone identified. Recommend correlation with liver function tests. Consider MRCP for further characterization. 2. Extensive colonic diverticulosis without evidence of acute diverticulitis. 3. Aortic atherosclerosis. 4. Remainder of the abdomen and pelvis is unremarkable, as detailed above. No bowel obstruction or evidence of bowel wall inflammation. No free fluid. No evidence of acute solid organ abnormality. No renal or ureteral calculi. Electronically  Signed   By: Franki Cabot M.D.   On: 07/14/2017 15:58    Impression: Very pleasant 83 year old female presenting with right-sided abdominal pain for the past week and found to have new onset elevated LFTs and marked common bile duct dilatation up to 2.5 cm on non-contrast CT. This morning, her abdominal pain has improved and LFTs have also improved. Gallbladder absent. She does not have evidence for cholangitis at this time. MRCP has been ordered, and I spoke with radiology on timing: they are to transport patient in next few minutes to MRI. She has been NPO, no anticoagulation. Will await findings from MRI. Question passage of possible CBD stone as clinically she has improved and LFTs are improving.   As of note: she is not on chronic anticoagulation as outpatient or inpateint currently.    Plan: MRCP today Remain NPO I did discuss possible need for ERCP, including risks and benefits. She is aware we are awaiting MRCP first.  Will continue to follow with you   Annitta Needs, PhD, ANP-BC [facility]     LOS: 1 day    07/15/2017, 9:23 AM

## 2017-07-16 ENCOUNTER — Inpatient Hospital Stay (HOSPITAL_COMMUNITY): Payer: Medicare Other

## 2017-07-16 ENCOUNTER — Inpatient Hospital Stay (HOSPITAL_COMMUNITY): Payer: Medicare Other | Admitting: Anesthesiology

## 2017-07-16 ENCOUNTER — Encounter (HOSPITAL_COMMUNITY)
Admission: EM | Disposition: A | Discharge status: Home / Self Care | Payer: Self-pay | Source: Home / Self Care | Attending: Pulmonary Disease

## 2017-07-16 ENCOUNTER — Encounter (HOSPITAL_COMMUNITY): Payer: Self-pay | Admitting: Gastroenterology

## 2017-07-16 DIAGNOSIS — I361 Nonrheumatic tricuspid (valve) insufficiency: Secondary | ICD-10-CM

## 2017-07-16 DIAGNOSIS — K831 Obstruction of bile duct: Secondary | ICD-10-CM

## 2017-07-16 DIAGNOSIS — K8050 Calculus of bile duct without cholangitis or cholecystitis without obstruction: Principal | ICD-10-CM

## 2017-07-16 DIAGNOSIS — K838 Other specified diseases of biliary tract: Secondary | ICD-10-CM

## 2017-07-16 HISTORY — PX: BALLOON DILATION: SHX5330

## 2017-07-16 HISTORY — PX: ERCP: SHX5425

## 2017-07-16 HISTORY — PX: SPHINCTEROTOMY: SHX5544

## 2017-07-16 LAB — GLUCOSE, CAPILLARY
GLUCOSE-CAPILLARY: 164 mg/dL — AB (ref 65–99)
GLUCOSE-CAPILLARY: 249 mg/dL — AB (ref 65–99)
Glucose-Capillary: 138 mg/dL — ABNORMAL HIGH (ref 65–99)
Glucose-Capillary: 220 mg/dL — ABNORMAL HIGH (ref 65–99)
Glucose-Capillary: 218 mg/dL — ABNORMAL HIGH (ref 65–99)

## 2017-07-16 LAB — ECHOCARDIOGRAM COMPLETE
Height: 63 in
Weight: 2793.67 oz

## 2017-07-16 SURGERY — ERCP, WITH INTERVENTION IF INDICATED
Anesthesia: General

## 2017-07-16 MED ORDER — IPRATROPIUM-ALBUTEROL 0.5-2.5 (3) MG/3ML IN SOLN
3.0000 mL | Freq: Once | RESPIRATORY_TRACT | Status: AC
  Administered 2017-07-16: 3 mL via RESPIRATORY_TRACT

## 2017-07-16 MED ORDER — STERILE WATER FOR IRRIGATION IR SOLN
Status: DC | PRN
  Administered 2017-07-16: 1000 mL

## 2017-07-16 MED ORDER — LACTATED RINGERS IV SOLN
INTRAVENOUS | Status: DC
  Administered 2017-07-16: 09:00:00 via INTRAVENOUS

## 2017-07-16 MED ORDER — GLUCAGON HCL RDNA (DIAGNOSTIC) 1 MG IJ SOLR
INTRAMUSCULAR | Status: DC | PRN
  Administered 2017-07-16 (×2): .5 mg via INTRAVENOUS

## 2017-07-16 MED ORDER — SODIUM CHLORIDE 0.9 % IV SOLN: INTRAVENOUS | Status: DC

## 2017-07-16 MED ORDER — SUGAMMADEX SODIUM 500 MG/5ML IV SOLN
INTRAVENOUS | Status: AC
Start: 2017-07-16 — End: ?
  Filled 2017-07-16: qty 5

## 2017-07-16 MED ORDER — LIDOCAINE HCL (PF) 1 % IJ SOLN
INTRAMUSCULAR | Status: AC
  Filled 2017-07-16: qty 5

## 2017-07-16 MED ORDER — ROCURONIUM BROMIDE 50 MG/5ML IV SOLN
INTRAVENOUS | Status: AC
Start: 2017-07-16 — End: ?
  Filled 2017-07-16: qty 1

## 2017-07-16 MED ORDER — DEXAMETHASONE SODIUM PHOSPHATE 4 MG/ML IJ SOLN
INTRAMUSCULAR | Status: AC
  Filled 2017-07-16: qty 1

## 2017-07-16 MED ORDER — ETOMIDATE 2 MG/ML IV SOLN
INTRAVENOUS | Status: AC
  Filled 2017-07-16: qty 10

## 2017-07-16 MED ORDER — ROCURONIUM BROMIDE 100 MG/10ML IV SOLN
INTRAVENOUS | Status: DC | PRN
  Administered 2017-07-16: 5 mg via INTRAVENOUS
  Administered 2017-07-16: 30 mg via INTRAVENOUS
  Administered 2017-07-16: 5 mg via INTRAVENOUS

## 2017-07-16 MED ORDER — FENTANYL CITRATE (PF) 100 MCG/2ML IJ SOLN
INTRAMUSCULAR | Status: AC
  Filled 2017-07-16: qty 4

## 2017-07-16 MED ORDER — ETOMIDATE 2 MG/ML IV SOLN
INTRAVENOUS | Status: DC | PRN
  Administered 2017-07-16: 2 mg via INTRAVENOUS
  Administered 2017-07-16: 10 mg via INTRAVENOUS

## 2017-07-16 MED ORDER — GLUCAGON HCL RDNA (DIAGNOSTIC) 1 MG IJ SOLR
INTRAMUSCULAR | Status: AC
  Filled 2017-07-16: qty 2

## 2017-07-16 MED ORDER — IPRATROPIUM-ALBUTEROL 0.5-2.5 (3) MG/3ML IN SOLN
RESPIRATORY_TRACT | Status: AC
  Filled 2017-07-16: qty 3

## 2017-07-16 MED ORDER — ONDANSETRON HCL 4 MG/2ML IJ SOLN
4.0000 mg | Freq: Once | INTRAMUSCULAR | Status: AC
  Administered 2017-07-16: 4 mg via INTRAVENOUS

## 2017-07-16 MED ORDER — IOPAMIDOL (ISOVUE-300) INJECTION 61%
INTRAVENOUS | Status: AC
  Filled 2017-07-16: qty 50

## 2017-07-16 MED ORDER — MIDAZOLAM HCL 2 MG/2ML IJ SOLN
1.0000 mg | INTRAMUSCULAR | Status: DC
  Administered 2017-07-16: 2 mg via INTRAVENOUS

## 2017-07-16 MED ORDER — MIDAZOLAM HCL 2 MG/2ML IJ SOLN
INTRAMUSCULAR | Status: AC
Start: 2017-07-16 — End: ?
  Filled 2017-07-16: qty 2

## 2017-07-16 MED ORDER — SUCCINYLCHOLINE CHLORIDE 20 MG/ML IJ SOLN
INTRAMUSCULAR | Status: AC
Start: 2017-07-16 — End: ?
  Filled 2017-07-16: qty 1

## 2017-07-16 MED ORDER — ONDANSETRON HCL 4 MG/2ML IJ SOLN
INTRAMUSCULAR | Status: AC
  Filled 2017-07-16: qty 2

## 2017-07-16 MED ORDER — FENTANYL CITRATE (PF) 100 MCG/2ML IJ SOLN: 25.0000 ug | INTRAMUSCULAR | Status: DC | PRN

## 2017-07-16 MED ORDER — DEXAMETHASONE SODIUM PHOSPHATE 4 MG/ML IJ SOLN
4.0000 mg | Freq: Once | INTRAMUSCULAR | Status: AC
  Administered 2017-07-16: 4 mg via INTRAVENOUS

## 2017-07-16 MED ORDER — SUGAMMADEX SODIUM 200 MG/2ML IV SOLN
INTRAVENOUS | Status: DC | PRN
  Administered 2017-07-16: 150 mg via INTRAVENOUS

## 2017-07-16 MED ORDER — IOPAMIDOL (ISOVUE-300) INJECTION 61%
INTRAVENOUS | Status: DC | PRN
  Administered 2017-07-16: 50 mL

## 2017-07-16 NOTE — Op Note (Addendum)
[facility] Patient Name: [name] Procedure Date: 07/16/2017 9:38 AM MRN: [id number] Date of Birth: 10-Apr-1929 Attending MD: Barney Drain MD, MD CSN: [id number] Age: 83 Admit Type: Inpatient Procedure:                ERCP with SPHINCTEROTOMY, balloon dilation,                            cholangiogram/stone extraction Indications:              Suspected bile duct stone(s), Elevated liver                            enzymes. NOTED TO BE IN AFIB IN PREOP. Providers:                Barney Drain MD, MD, Jeanann Lewandowsky. Sharon Seller, RN, Starla Link RN, RN, Aram Candela Referring MD:             Jasper Loser. Luan Pulling MD, MD Medicines:                General Anesthesia Complications:            No immediate complications. Estimated Blood Loss:     Estimated blood loss was minimal. Procedure:                Pre-Anesthesia Assessment:                           - Prior to the procedure, a History and Physical                            was performed, and patient medications and                            allergies were reviewed. The patient's tolerance of                            previous anesthesia was also reviewed. The risks                            and benefits of the procedure and the sedation                            options and risks were discussed with the patient.                            All questions were answered, and informed consent                            was obtained. Prior Anticoagulants: The patient has                            taken no previous anticoagulant or antiplatelet  agents. ASA Grade Assessment: II - A patient with                            mild systemic disease. After reviewing the risks                            and benefits, the patient was deemed in                            satisfactory condition to undergo the procedure.                           After obtaining informed consent, the scope  was                            passed under direct vision. Throughout the                            procedure, the patient's blood pressure, pulse, and                            oxygen saturations were monitored continuously. The                            VF-[id number] ([id number]) scope was introduced through                            the mouth, and used to inject contrast into and                            used to cannulate the bile duct. The ERCP was                            technically difficult and complex due to                            challenging cannulation because of papillary                            stenosis. The patient tolerated the procedure well. Scope In: 60:63:01 AM Scope Out: 12:30:15 PM Total Procedure Duration: 1 hour 43 minutes 57 seconds  Findings:      The scout film was normal. The esophagus was successfully intubated       under direct vision. The scope was advanced to a normal major papilla in       the descending duodenum without detailed examination of the pharynx,       larynx and associated structures, and upper GI tract. The upper GI tract       was grossly normal. The bile duct was deeply cannulated with the       short-nosed traction sphincterotome. Contrast was injected. I personally       interpreted the bile duct images. There was brisk flow of contrast       through the ducts. Image quality was excellent. Contrast extended to the  entire biliary tree. The entire biliary tree was diffusely dilated. The       largest diameter was 20 mm. The common bile duct contained filling       defect(s) thought to be a stone and sludge. Opacification of the entire       biliary tree was successful. The common bile duct contained multiple       stones, the largest of which was 8 mm in diameter. The biliary orifice       was stenotic. This appeared benign. The biliary sphincterotomy was       extended to a total of 12 mm in length with a Autotome  sphincterotome       using blended current. The sphincterotomy oozed blood. The biliary tree       was swept with a 12 mm balloon, 13.5 mm balloon and 15 mm balloon       starting at the lower third of the main duct. Sludge was swept from the       duct. Many stones were removed. No stones remained. Dilation of the       common bile duct with a 06-07-11 mm x 5.5 cm CRE balloon (to a maximum       balloon size of 11 mm) dilator was successful. Impression:               - Biliary papillary stenosis, benign.                           - A filling defect consistent with a stone and                            sludge was seen on the cholangiogram.                           - The entire biliary tree was dilated.                           - Choledocholithiasis was found. Complete removal                            was accomplished by biliary sphincterotomy and                            balloon extraction.                           - A biliary sphincterotomy was performed.                           - The biliary tree was swept.                           - Common bile duct was successfully dilated. Moderate Sedation:      Per Anesthesia Care Recommendation:           - NPO except meds                           - Continue present medications.                           -  Return patient to hospital ward for ongoing care.                           - Avoid aspirin and nonsteroidal anti-inflammatory                            medicines for 24HR. MAY RE-START HEPARIN WITHOUT A                            BOLUS IF NEEDED IN 12-24 HRS. NO ANTI-COAGULATION                            DUE TO RECENT SPHINCTEROTOMY.                           - Watch for pancreatitis, bleeding, perforation,                            and cholangitis.                           - DISCUSSED RECOMMENDATION AND FINDINGS WITH DR.                            Luan Pulling. Procedure Code(s):        --- Professional ---                            [phone], 42, Endoscopic retrograde                            cholangiopancreatography (ERCP); with                            trans-endoscopic balloon dilation of                            biliary/pancreatic duct(s) or of ampulla                            (sphincteroplasty), including sphincterotomy, when                            performed, each duct                           [id number], Endoscopic retrograde                            cholangiopancreatography (ERCP); with removal of                            calculi/debris from biliary/pancreatic duct(s) Diagnosis Code(s):        --- Professional ---                           K83.1, Obstruction of bile duct  K80.50, Calculus of bile duct without cholangitis                            or cholecystitis without obstruction                           R74.8, Abnormal levels of other serum enzymes                           K83.8, Other specified diseases of biliary tract                           R93.2, Abnormal findings on diagnostic imaging of                            liver and biliary tract CPT copyright 2016 American Medical Association. All rights reserved. The codes documented in this report are preliminary and upon coder review may  be revised to meet current compliance requirements. Barney Drain, MD Barney Drain MD, MD 07/16/2017 12:51:35 PM This report has been signed electronically. Number of Addenda: 0

## 2017-07-16 NOTE — Anesthesia Preprocedure Evaluation (Addendum)
Anesthesia Evaluation  Patient identified by MRN, date of birth, ID band Patient awake    Reviewed: Allergy & Precautions, NPO status , Patient's Chart, lab work & pertinent test results  Airway Mallampati: II  TM Distance: >3 FB Neck ROM: Full    Dental  (+) Teeth Intact, Dental Advisory Given   Pulmonary COPD,    breath sounds clear to auscultation       Cardiovascular hypertension, Pt. on medications  Rhythm:Regular Rate:Normal     Neuro/Psych PSYCHIATRIC DISORDERS Anxiety Depression  Neuromuscular disease    GI/Hepatic GERD  Medicated and Controlled,  Endo/Other  diabetes, Type 2, Oral Hypoglycemic Agents  Renal/GU Renal InsufficiencyRenal disease     Musculoskeletal   Abdominal   Peds  Hematology   Anesthesia Other Findings   Reproductive/Obstetrics                            Anesthesia Physical Anesthesia Plan  ASA: III  Anesthesia Plan: General   Post-op Pain Management:    Induction: Intravenous, Rapid sequence and Cricoid pressure planned  PONV Risk Score and Plan:   Airway Management Planned: Oral ETT  Additional Equipment:   Intra-op Plan:   Post-operative Plan: Extubation in OR  Informed Consent: I have reviewed the patients History and Physical, chart, labs and discussed the procedure including the risks, benefits and alternatives for the proposed anesthesia with the patient or authorized representative who has indicated his/her understanding and acceptance.     Plan Discussed with:   Anesthesia Plan Comments:         Anesthesia Quick Evaluation

## 2017-07-16 NOTE — Progress Notes (Signed)
Subjective: She feels okay.  She is not having much abdominal pain here her MRCP yesterday showed what looked like some stricture in the common bile duct and she is scheduled for ERCP today.  She also had noncontrast CT of the chest for aortic dissection which was negative.  She had a nonspecific infiltrate which I think may be left over from her pneumonia.  This does need to be rechecked in 3 months.  Objective: Vital signs in last 24 hours: Temp:  [97.8 F (36.6 C)-98.2 F (36.8 C)] 98.2 F (36.8 C) (11/20 0500) Pulse Rate:  [77-85] 82 (11/20 0500) Resp:  [19-20] 20 (11/20 0500) BP: (89-113)/(42-79) 89/42 (11/20 0500) SpO2:  [98 %-100 %] 98 % (11/20 0500) Weight change:  Last BM Date: 07/13/17  Intake/Output from previous day: 11/19 0701 - 11/20 0700 In: 240 [P.O.:240] Out: -   PHYSICAL EXAM General appearance: alert, cooperative and no distress Resp: rhonchi bilaterally Cardio: regular rate and rhythm, S1, S2 normal, no murmur, click, rub or gallop GI: Minimal if any tenderness Extremities: extremities normal, atraumatic, no cyanosis or edema Very poor eyesight  Lab Results:  Results for orders placed or performed during the hospital encounter of 07/14/17 (from the past 48 hour(s))  CBC with Differential/Platelet     Status: Abnormal   Collection Time: 07/14/17  1:11 PM  Result Value Ref Range   WBC 10.5 4.0 - 10.5 K/uL   RBC 4.39 3.87 - 5.11 MIL/uL   Hemoglobin 13.7 12.0 - 15.0 g/dL   HCT 40.4 36.0 - 46.0 %   MCV 92.0 78.0 - 100.0 fL   MCH 31.2 26.0 - 34.0 pg   MCHC 33.9 30.0 - 36.0 g/dL   RDW 13.7 11.5 - 15.5 %   Platelets 163 150 - 400 K/uL   Neutrophils Relative % 85 %   Neutro Abs 8.9 (H) 1.7 - 7.7 K/uL   Lymphocytes Relative 8 %   Lymphs Abs 0.9 0.7 - 4.0 K/uL   Monocytes Relative 7 %   Monocytes Absolute 0.8 0.1 - 1.0 K/uL   Eosinophils Relative 0 %   Eosinophils Absolute 0.0 0.0 - 0.7 K/uL   Basophils Relative 0 %   Basophils Absolute 0.0 0.0 - 0.1 K/uL    WBC Morphology HYPERSEGMENTED NEUT     Comment: ATYPICAL LYMPHOCYTES  Comprehensive metabolic panel     Status: Abnormal   Collection Time: 07/14/17  1:11 PM  Result Value Ref Range   Sodium 134 (L) 135 - 145 mmol/L   Potassium 4.0 3.5 - 5.1 mmol/L   Chloride 103 101 - 111 mmol/L   CO2 22 22 - 32 mmol/L   Glucose, Bld 367 (H) 65 - 99 mg/dL   BUN 37 (H) 6 - 20 mg/dL   Creatinine, Ser 1.75 (H) 0.44 - 1.00 mg/dL   Calcium 8.6 (L) 8.9 - 10.3 mg/dL   Total Protein 5.7 (L) 6.5 - 8.1 g/dL   Albumin 3.3 (L) 3.5 - 5.0 g/dL   AST 72 (H) 15 - 41 U/L   ALT 108 (H) 14 - 54 U/L   Alkaline Phosphatase 152 (H) 38 - 126 U/L   Total Bilirubin 2.1 (H) 0.3 - 1.2 mg/dL   GFR calc non Af Amer 25 (L) >60 mL/min   GFR calc Af Amer 29 (L) >60 mL/min    Comment: (NOTE) The eGFR has been calculated using the CKD EPI equation. This calculation has not been validated in all clinical situations. eGFR's persistently <60 mL/min signify  possible Chronic Kidney Disease.    Anion gap 9 5 - 15  Urinalysis, dipstick only     Status: Abnormal   Collection Time: 07/14/17  1:11 PM  Result Value Ref Range   Color, Urine AMBER (A) YELLOW    Comment: BIOCHEMICALS MAY BE AFFECTED BY COLOR   APPearance HAZY (A) CLEAR   Specific Gravity, Urine 1.019 1.005 - 1.030   pH 5.0 5.0 - 8.0   Glucose, UA 50 (A) NEGATIVE mg/dL   Hgb urine dipstick NEGATIVE NEGATIVE   Bilirubin Urine NEGATIVE NEGATIVE   Ketones, ur NEGATIVE NEGATIVE mg/dL   Protein, ur NEGATIVE NEGATIVE mg/dL   Nitrite NEGATIVE NEGATIVE   Leukocytes, UA MODERATE (A) NEGATIVE  Lipase, blood     Status: None   Collection Time: 07/14/17  1:11 PM  Result Value Ref Range   Lipase 18 11 - 51 U/L  Glucose, capillary     Status: Abnormal   Collection Time: 07/14/17  9:39 PM  Result Value Ref Range   Glucose-Capillary 299 (H) 65 - 99 mg/dL   Comment 1 Notify RN    Comment 2 Document in Chart   Glucose, capillary     Status: Abnormal   Collection Time:  07/15/17  4:18 AM  Result Value Ref Range   Glucose-Capillary 207 (H) 65 - 99 mg/dL   Comment 1 Notify RN    Comment 2 Document in Chart   CBC     Status: None   Collection Time: 07/15/17  6:14 AM  Result Value Ref Range   WBC 9.6 4.0 - 10.5 K/uL   RBC 3.94 3.87 - 5.11 MIL/uL   Hemoglobin 12.5 12.0 - 15.0 g/dL   HCT 37.5 36.0 - 46.0 %   MCV 95.2 78.0 - 100.0 fL   MCH 31.7 26.0 - 34.0 pg   MCHC 33.3 30.0 - 36.0 g/dL   RDW 13.9 11.5 - 15.5 %   Platelets 164 150 - 400 K/uL  Comprehensive metabolic panel     Status: Abnormal   Collection Time: 07/15/17  6:14 AM  Result Value Ref Range   Sodium 136 135 - 145 mmol/L   Potassium 3.8 3.5 - 5.1 mmol/L   Chloride 102 101 - 111 mmol/L   CO2 25 22 - 32 mmol/L   Glucose, Bld 193 (H) 65 - 99 mg/dL   BUN 40 (H) 6 - 20 mg/dL   Creatinine, Ser 1.85 (H) 0.44 - 1.00 mg/dL   Calcium 8.3 (L) 8.9 - 10.3 mg/dL   Total Protein 5.5 (L) 6.5 - 8.1 g/dL   Albumin 3.1 (L) 3.5 - 5.0 g/dL   AST 35 15 - 41 U/L   ALT 77 (H) 14 - 54 U/L   Alkaline Phosphatase 123 38 - 126 U/L   Total Bilirubin 1.7 (H) 0.3 - 1.2 mg/dL   GFR calc non Af Amer 24 (L) >60 mL/min   GFR calc Af Amer 27 (L) >60 mL/min    Comment: (NOTE) The eGFR has been calculated using the CKD EPI equation. This calculation has not been validated in all clinical situations. eGFR's persistently <60 mL/min signify possible Chronic Kidney Disease.    Anion gap 9 5 - 15  Glucose, capillary     Status: Abnormal   Collection Time: 07/15/17  7:45 AM  Result Value Ref Range   Glucose-Capillary 189 (H) 65 - 99 mg/dL  Glucose, capillary     Status: Abnormal   Collection Time: 07/15/17 11:52 AM  Result Value  Ref Range   Glucose-Capillary 119 (H) 65 - 99 mg/dL  Glucose, capillary     Status: Abnormal   Collection Time: 07/15/17  4:49 PM  Result Value Ref Range   Glucose-Capillary 187 (H) 65 - 99 mg/dL  Glucose, capillary     Status: Abnormal   Collection Time: 07/15/17  9:03 PM  Result Value  Ref Range   Glucose-Capillary 149 (H) 65 - 99 mg/dL   Comment 1 Notify RN    Comment 2 Document in Chart   Glucose, capillary     Status: Abnormal   Collection Time: 07/16/17  7:21 AM  Result Value Ref Range   Glucose-Capillary 138 (H) 65 - 99 mg/dL   Comment 1 Notify RN    Comment 2 Document in Chart     ABGS No results for input(s): PHART, PO2ART, TCO2, HCO3 in the last 72 hours.  Invalid input(s): PCO2 CULTURES No results found for this or any previous visit (from the past 240 hour(s)). Studies/Results: Ct Abdomen Pelvis Wo Contrast  Result Date: 07/14/2017 CLINICAL DATA:  Epigastric, periumbilical and right lower quadrant pain for 2 days. EXAM: CT ABDOMEN AND PELVIS WITHOUT CONTRAST TECHNIQUE: Multidetector CT imaging of the abdomen and pelvis was performed following the standard protocol without IV contrast. COMPARISON:  CT abdomen dated 03/30/2005. FINDINGS: Lower chest: No acute findings. Mild focal scarring/atelectasis at the right lung base. Hepatobiliary: No focal liver abnormality is seen. There is marked common bile duct dilatation, measuring up to approximately 2.5 cm. Gallbladder is not seen, presumed cholecystectomy. Pancreas: Fatty infiltration of the pancreas. Otherwise unremarkable. Spleen: Normal in size without focal abnormality. Adrenals/Urinary Tract: Adrenal glands are unremarkable. Parapelvic renal cysts bilaterally. No renal stone or hydronephrosis. No ureteral or bladder calculi identified. Bladder is unremarkable, partially decompressed. Stomach/Bowel: Extensive diverticulosis throughout the colon but no focal inflammatory change to suggest acute diverticulitis. Stomach is unremarkable. No dilated large or small bowel loops. Appendix is not seen but there are no inflammatory changes about the cecum to suggest acute appendicitis. Vascular/Lymphatic: Aortic atherosclerosis. No enlarged lymph nodes seen. Reproductive: Presumed hysterectomy. No adnexal mass or  inflammatory change. Other: No free fluid or abscess collection. No free intraperitoneal air. Musculoskeletal: Mild degenerative change of the slightly scoliotic thoracolumbar spine. No acute or suspicious osseous finding. Superficial soft tissues are unremarkable. IMPRESSION: 1. Marked common bile duct dilatation, measuring up to 2.5 cm diameter. No obstructing stone identified. Recommend correlation with liver function tests. Consider MRCP for further characterization. 2. Extensive colonic diverticulosis without evidence of acute diverticulitis. 3. Aortic atherosclerosis. 4. Remainder of the abdomen and pelvis is unremarkable, as detailed above. No bowel obstruction or evidence of bowel wall inflammation. No free fluid. No evidence of acute solid organ abnormality. No renal or ureteral calculi. Electronically Signed   By: Franki Cabot M.D.   On: 07/14/2017 15:58   Ct Chest Wo Contrast  Result Date: 07/15/2017 CLINICAL DATA:  Followup abnormal MRCP suggesting possible aortic dissection. EXAM: CT CHEST WITHOUT CONTRAST TECHNIQUE: Multidetector CT imaging of the chest was performed following the standard protocol without IV contrast. COMPARISON:  MRCP 07/15/2017 FINDINGS: Cardiovascular: The heart is normal in size for age. No pericardial effusion. Advanced atherosclerotic calcifications involving the tortuous thoracic aorta. No obvious displaced intimal calcifications or other findings to definitely confirm the thoracic aortic dissection but the MR findings are quite convincing. There are three-vessel coronary artery calcifications. The pulmonary arteries are enlarged suggesting pulmonary hypertension. Mediastinum/Nodes: Small scattered mediastinal and hilar lymph nodes but no  mass or adenopathy. The esophagus is grossly normal. Lungs/Pleura: Mild emphysematous changes but no acute pulmonary findings. Patchy sub solid nodular lesion in the right lower lobe on image number 85. This measures 13.5 x 10 mm. On the  coronal and sagittal images this has more the appearance of a cluster of small nodules can could reflect focal area of inflammation or atypical infection could. Recommend followup noncontrast chest CT in 3 months to reassess. Left lower lobe scarring changes are noted. Upper Abdomen: No significant upper abdominal findings other than the intra and extrahepatic biliary dilatation seen on the MRCP. Musculoskeletal: No breast masses, supraclavicular or axillary adenopathy. The bony structures are unremarkable. Scoliosis and degenerative changes are noted in the thoracic spine. IMPRESSION: 1. No obvious thoracic aortic dissection on this non contrasted CT scan but the MR findings are quite convincing. 2. Three-vessel coronary artery calcifications. 3. Patchy nodular opacity in the right lower lobe as discussed above. Recommend follow-up noncontrast chest CT in 3 months. 4. No mediastinal or hilar mass or adenopathy. 5. Stable intra and extrahepatic biliary dilatation. Aortic Atherosclerosis (ICD10-I70.0) and Emphysema (ICD10-J43.9). Electronically Signed   By: Marijo Sanes M.D.   On: 07/15/2017 12:41   Mr Abdomen Mrcp Wo Contrast  Result Date: 07/15/2017 CLINICAL DATA:  83 year old with epigastric, periumbilical and right lower quadrant abdominal pain for 2 days. Biliary dilatation on CT. History of diabetes and chronic kidney disease. Mildly elevated liver function studies. EXAM: MRI ABDOMEN WITHOUT CONTRAST  (INCLUDING MRCP) TECHNIQUE: Multiplanar multisequence MR imaging of the abdomen was performed. Heavily T2-weighted images of the biliary and pancreatic ducts were obtained, and three-dimensional MRCP images were rendered by post processing. COMPARISON:  Abdominal CT 07/14/2017 and 03/30/2005. Chest CT 12/10/2004. FINDINGS: Despite efforts by the technologist and patient, mild motion artifact is present on today's exam and could not be eliminated. This reduces exam sensitivity and specificity. Lower chest:   The visualized lower chest appears unremarkable. Hepatobiliary: Allowing for the motion artifact and lack of contrast, the liver appears unremarkable aside from marked intra and extrahepatic biliary dilatation as seen on recent CT. The common hepatic duct measures up to 23 mm in diameter. There is a large tubular shaped filling defect within the common bile duct which measures up to 4.3 cm in length on coronal image 81 of series 5 and up to 1.6 cm transverse (image 28 of series 17). This demonstrates heterogeneous areas of decreased T2 signal, especially inferiorly. No definite ampullary mass. Previous cholecystectomy. Pancreas: Atrophied with fatty replacement. No pancreatic ductal dilatation or surrounding inflammation. Spleen: Normal in size without focal abnormality. Adrenals/Urinary Tract: Both adrenal glands appear normal. Both kidneys demonstrate cortical thinning and cyst formation in the sinus fat. No hydronephrosis or suspicious cortical lesion. Stomach/Bowel: No evidence of bowel wall thickening, distention or surrounding inflammatory change.There is diffuse colonic diverticulosis. Vascular/Lymphatic: There are no enlarged abdominal lymph nodes. There is crescentic high T1 and high T2 signal along the right aspect of the distal thoracic aorta suspicious for aortic dissection. On the coronal images, this extends approximately 8.7 cm in length, without definite involvement of the aortic arch. This is incompletely evaluated by this noncontrast examination of the abdomen. Other: No ascites. Musculoskeletal: No acute or significant osseous findings. There are degenerative changes throughout the thoracolumbar spine associated with a mild scoliosis. IMPRESSION: 1. Marked intrahepatic and extrahepatic biliary dilatation with tubular filling defect in the common bile duct, likely a combination of gallstones and sludge. The degree of biliary dilatation suggests an underlying  biliary stricture, and ERCP should be  considered. No obvious ampullary mass. 2. Suspected dissection of the distal thoracic aorta, age indeterminate. This is incompletely visualized by the current examination. The patient's renal insufficiency likely precludes performance of contrast-enhanced CTA. Consider further evaluation with noncontrast chest CT or MRA. 3. Bilateral renal cortical thinning.  No hydronephrosis. 4. These results will be called to the ordering clinician or representative by the Radiologist Assistant, and communication documented in the PACS or zVision Dashboard. Electronically Signed   By: Richardean Sale M.D.   On: 07/15/2017 10:40   Mr 3d Recon At Scanner  Result Date: 07/15/2017 CLINICAL DATA:  83 year old with epigastric, periumbilical and right lower quadrant abdominal pain for 2 days. Biliary dilatation on CT. History of diabetes and chronic kidney disease. Mildly elevated liver function studies. EXAM: MRI ABDOMEN WITHOUT CONTRAST  (INCLUDING MRCP) TECHNIQUE: Multiplanar multisequence MR imaging of the abdomen was performed. Heavily T2-weighted images of the biliary and pancreatic ducts were obtained, and three-dimensional MRCP images were rendered by post processing. COMPARISON:  Abdominal CT 07/14/2017 and 03/30/2005. Chest CT 12/10/2004. FINDINGS: Despite efforts by the technologist and patient, mild motion artifact is present on today's exam and could not be eliminated. This reduces exam sensitivity and specificity. Lower chest:  The visualized lower chest appears unremarkable. Hepatobiliary: Allowing for the motion artifact and lack of contrast, the liver appears unremarkable aside from marked intra and extrahepatic biliary dilatation as seen on recent CT. The common hepatic duct measures up to 23 mm in diameter. There is a large tubular shaped filling defect within the common bile duct which measures up to 4.3 cm in length on coronal image 81 of series 5 and up to 1.6 cm transverse (image 28 of series 17). This  demonstrates heterogeneous areas of decreased T2 signal, especially inferiorly. No definite ampullary mass. Previous cholecystectomy. Pancreas: Atrophied with fatty replacement. No pancreatic ductal dilatation or surrounding inflammation. Spleen: Normal in size without focal abnormality. Adrenals/Urinary Tract: Both adrenal glands appear normal. Both kidneys demonstrate cortical thinning and cyst formation in the sinus fat. No hydronephrosis or suspicious cortical lesion. Stomach/Bowel: No evidence of bowel wall thickening, distention or surrounding inflammatory change.There is diffuse colonic diverticulosis. Vascular/Lymphatic: There are no enlarged abdominal lymph nodes. There is crescentic high T1 and high T2 signal along the right aspect of the distal thoracic aorta suspicious for aortic dissection. On the coronal images, this extends approximately 8.7 cm in length, without definite involvement of the aortic arch. This is incompletely evaluated by this noncontrast examination of the abdomen. Other: No ascites. Musculoskeletal: No acute or significant osseous findings. There are degenerative changes throughout the thoracolumbar spine associated with a mild scoliosis. IMPRESSION: 1. Marked intrahepatic and extrahepatic biliary dilatation with tubular filling defect in the common bile duct, likely a combination of gallstones and sludge. The degree of biliary dilatation suggests an underlying biliary stricture, and ERCP should be considered. No obvious ampullary mass. 2. Suspected dissection of the distal thoracic aorta, age indeterminate. This is incompletely visualized by the current examination. The patient's renal insufficiency likely precludes performance of contrast-enhanced CTA. Consider further evaluation with noncontrast chest CT or MRA. 3. Bilateral renal cortical thinning.  No hydronephrosis. 4. These results will be called to the ordering clinician or representative by the Radiologist Assistant, and  communication documented in the PACS or zVision Dashboard. Electronically Signed   By: Richardean Sale M.D.   On: 07/15/2017 10:40    Medications:  Prior to Admission:  Medications Prior to Admission  Medication Sig Dispense Refill Last Dose  . amLODipine (NORVASC) 5 MG tablet Take 5 mg by mouth daily.     07/14/2017 at Unknown time  . busPIRone (BUSPAR) 5 MG tablet Take 5 mg 2 (two) times daily as needed by mouth.   07/13/2017 at Unknown time  . cholecalciferol (VITAMIN D) 1000 units tablet Take 1,000 Units daily by mouth.   07/14/2017 at Unknown time  . dorzolamide (TRUSOPT) 2 % ophthalmic solution Place 1 drop into both eyes 2 (two) times daily.     07/14/2017 at Unknown time  . furosemide (LASIX) 20 MG tablet Take 20 mg daily as needed by mouth for fluid.   Past Month at Unknown time  . glipiZIDE (GLUCOTROL) 5 MG tablet Take 5 mg 2 (two) times daily by mouth.    07/14/2017 at Unknown time  . hydrocortisone cream 1 % Apply 2 (two) times daily topically. 30 g 0 Past Week at Unknown time  . omeprazole (PRILOSEC) 20 MG capsule Take 20 mg by mouth daily.     07/14/2017 at Unknown time  . pravastatin (PRAVACHOL) 40 MG tablet Take 40 mg by mouth daily.     07/14/2017 at Unknown time  . traMADol (ULTRAM) 50 MG tablet Take 1 tablet (50 mg total) by mouth every 6 (six) hours as needed for moderate pain. 30 tablet 0 07/13/2017 at Unknown time  . acetaminophen (TYLENOL) 500 MG tablet Take 500 mg by mouth every 6 (six) hours as needed for mild pain or moderate pain.   Not Taking at Unknown time  . brimonidine (ALPHAGAN P) 0.1 % SOLN Place 1 drop into both eyes 2 (two) times daily.     Not Taking at Unknown time  . ipratropium-albuterol (DUONEB) 0.5-2.5 (3) MG/3ML SOLN Take 3 mLs by nebulization every 4 (four) hours as needed. Shortness of Breath   06/30/2017 at Unknown time  . latanoprost (XALATAN) 0.005 % ophthalmic solution Place 1 drop into both eyes at bedtime.     Not Taking at Unknown time  .  levofloxacin (LEVAQUIN) 500 MG tablet Take 1 tablet (500 mg total) every other day by mouth. 5 tablet 0 07/12/2017  . oxyCODONE-acetaminophen (PERCOCET/ROXICET) 5-325 MG per tablet Take 1 tablet by mouth every 6 (six) hours as needed for severe pain. (Patient not taking: Reported on 07/14/2017) 20 tablet 0 Not Taking   Scheduled: . [MAR Hold] amLODipine  5 mg Oral Daily  . [MAR Hold] brimonidine  1 drop Both Eyes BID  . [MAR Hold] dorzolamide  1 drop Both Eyes BID  . [MAR Hold] insulin aspart  0-15 Units Subcutaneous TID WC  . [MAR Hold] insulin aspart  0-5 Units Subcutaneous QHS  . [MAR Hold] latanoprost  1 drop Both Eyes QHS  . [MAR Hold] LORazepam  1 mg Intravenous Once  . [MAR Hold] mouth rinse  15 mL Mouth Rinse BID  . [MAR Hold] pantoprazole  40 mg Oral Daily  . [MAR Hold] pravastatin  40 mg Oral Daily   Continuous: . [MAR Hold] ampicillin-sulbactam (UNASYN) IV     PRN:[MAR Hold] acetaminophen **OR** [MAR Hold] acetaminophen, [MAR Hold] busPIRone, [MAR Hold] ipratropium-albuterol, [MAR Hold]  morphine injection, [MAR Hold] ondansetron **OR** [MAR Hold] ondansetron (ZOFRAN) IV  Assesment: She was admitted with abdominal pain elevated liver function testing and dilation of the common bile duct.  She has recently had an episode of pneumonia and that is better.  She has what looks like a stricture in the common bile duct by  MRCP.  There was concern that she might have had aortic dissection but that did not show on CT. Principal Problem:   Common bile duct dilatation Active Problems:   Type 2 diabetes mellitus (HCC)   COPD (chronic obstructive pulmonary disease) (HCC)   Glaucoma   Essential hypertension   Elevated LFTs   CKD (chronic kidney disease) stage 3, GFR 30-59 ml/min (HCC)    Plan: No change in treatments.  For ERCP today    LOS: 2 days   Brentlee Sciara L 07/16/2017, 8:42 AM

## 2017-07-16 NOTE — Transfer of Care (Signed)
Immediate Anesthesia Transfer of Care Note  Patient: [name]  Procedure(s) Performed: ENDOSCOPIC RETROGRADE CHOLANGIOPANCREATOGRAPHY (ERCP) BALLOON EXTRACTION SLUDGE/STONE REMOVAL (N/A ) SPHINCTEROTOMY BALLOON DILATION  Patient Location: PACU  Anesthesia Type:General  Level of Consciousness: awake and patient cooperative  Airway & Oxygen Therapy: Patient Spontanous Breathing and Patient connected to face mask oxygen  Post-op Assessment: Report given to RN, Post -op Vital signs reviewed and stable and Patient moving all extremities  Post vital signs: Reviewed and stable  Last Vitals:  Vitals:   07/15/17 2057 07/16/17 0500  BP: 113/79 (!) 89/42  Pulse: 77 82  Resp: 19 20  Temp: 36.6 C 36.8 C  SpO2: 100% 98%    Last Pain:  Vitals:   07/16/17 0744  TempSrc:   PainSc: 5       Patients Stated Pain Goal: 1 (74/25/52 [id number])  Complications: No apparent anesthesia complications

## 2017-07-16 NOTE — Anesthesia Procedure Notes (Signed)
Procedure Name: Intubation Date/Time: 07/16/2017 10:19 AM Performed by: Charmaine Downs, CRNA Pre-anesthesia Checklist: Patient identified, Patient being monitored, Timeout performed, Emergency Drugs available and Suction available Patient Re-evaluated:Patient Re-evaluated prior to induction Oxygen Delivery Method: Circle System Utilized Preoxygenation: Pre-oxygenation with 100% oxygen Induction Type: IV induction Ventilation: Mask ventilation without difficulty Laryngoscope Size: Mac and 4 Grade View: Grade I Tube type: Oral Tube size: 7.0 mm Number of attempts: 1 Airway Equipment and Method: stylet Placement Confirmation: ETT inserted through vocal cords under direct vision,  positive ETCO2 and breath sounds checked- equal and bilateral Secured at: 22 cm Tube secured with: Tape Dental Injury: Teeth and Oropharynx as per pre-operative assessment

## 2017-07-16 NOTE — Progress Notes (Signed)
Verbal order via Dr. Oneida Alar to advance to a full liquid diet.

## 2017-07-16 NOTE — Progress Notes (Signed)
*  PRELIMINARY RESULTS* Echocardiogram 2D Echocardiogram has been performed with Definity.  Samuel Germany 07/16/2017, 4:12 PM

## 2017-07-16 NOTE — Anesthesia Postprocedure Evaluation (Signed)
Anesthesia Post Note  Patient: [name]  Procedure(s) Performed: ENDOSCOPIC RETROGRADE CHOLANGIOPANCREATOGRAPHY (ERCP) BALLOON EXTRACTION SLUDGE/STONE REMOVAL (N/A ) SPHINCTEROTOMY BALLOON DILATION  Patient location during evaluation: PACU Anesthesia Type: General Level of consciousness: awake and oriented Pain management: pain level controlled Vital Signs Assessment: post-procedure vital signs reviewed and stable Respiratory status: spontaneous breathing, nonlabored ventilation and respiratory function stable Cardiovascular status: blood pressure returned to baseline Postop Assessment: adequate PO intake Anesthetic complications: no     Last Vitals:  Vitals:   07/16/17 1315 07/16/17 1330  BP: 122/82 112/71  Pulse: 89 94  Resp: 19 18  Temp:  36.6 C  SpO2: 96% 94%    Last Pain:  Vitals:   07/16/17 1248  TempSrc:   PainSc: Asleep                 Elsi Stelzer J

## 2017-07-16 NOTE — H&P (Signed)
Primary Care Physician:  Sinda Du, MD Primary Gastroenterologist:  Dr. Oneida Alar  Pre-Procedure History & Physical: HPI:  [name] is a 83 y.o. female here for CBD OBSTRUCTION/?STONE V. DISTAL CBD MASS.  Past Medical History:  Diagnosis Date  . Acid reflux   . Anxiety   . CKD (chronic kidney disease)   . COPD (chronic obstructive pulmonary disease) ([place])   . Depressed   . Diabetes mellitus   . Glaucoma   . Hypercholesteremia   . Hypertension   . Legally blind in right eye, as defined in [place]   . Panic attacks   . Type 2 diabetes mellitus ([place])     Past Surgical History:  Procedure Laterality Date  . APPENDECTOMY    . cataracts    . CHOLECYSTECTOMY    . ESOPHAGOGASTRODUODENOSCOPY  2006   Dr. Gala Romney: normal esophagus s/p empiric dilataton, small hiatal hernia  . HIP SURGERY     rod in leg as well  . TUBAL LIGATION      Prior to Admission medications   Medication Sig Start Date End Date Taking? Authorizing Provider  amLODipine (NORVASC) 5 MG tablet Take 5 mg by mouth daily.     Yes [provider]  busPIRone (BUSPAR) 5 MG tablet Take 5 mg 2 (two) times daily as needed by mouth.   Yes [provider]  cholecalciferol (VITAMIN D) 1000 units tablet Take 1,000 Units daily by mouth.   Yes [provider]  dorzolamide (TRUSOPT) 2 % ophthalmic solution Place 1 drop into both eyes 2 (two) times daily.     Yes [provider]  furosemide (LASIX) 20 MG tablet Take 20 mg daily as needed by mouth for fluid.   Yes [provider]  glipiZIDE (GLUCOTROL) 5 MG tablet Take 5 mg 2 (two) times daily by mouth.    Yes [provider]  hydrocortisone cream 1 % Apply 2 (two) times daily topically. 07/05/17  Yes Sinda Du, MD  omeprazole (PRILOSEC) 20 MG capsule Take 20 mg by mouth daily.     Yes [provider]  pravastatin (PRAVACHOL) 40 MG tablet Take 40 mg by mouth daily.     Yes [provider]  traMADol  (ULTRAM) 50 MG tablet Take 1 tablet (50 mg total) by mouth every 6 (six) hours as needed for moderate pain. 04/05/15  Yes Dorie Rank, MD  acetaminophen (TYLENOL) 500 MG tablet Take 500 mg by mouth every 6 (six) hours as needed for mild pain or moderate pain.    [provider]  brimonidine (ALPHAGAN P) 0.1 % SOLN Place 1 drop into both eyes 2 (two) times daily.      [provider]  ipratropium-albuterol (DUONEB) 0.5-2.5 (3) MG/3ML SOLN Take 3 mLs by nebulization every 4 (four) hours as needed. Shortness of Breath    [provider]  latanoprost (XALATAN) 0.005 % ophthalmic solution Place 1 drop into both eyes at bedtime.      [provider]  levofloxacin (LEVAQUIN) 500 MG tablet Take 1 tablet (500 mg total) every other day by mouth. 07/06/17   Sinda Du, MD  oxyCODONE-acetaminophen (PERCOCET/ROXICET) 5-325 MG per tablet Take 1 tablet by mouth every 6 (six) hours as needed for severe pain. Patient not taking: Reported on 07/14/2017 04/06/15   Rolland Porter, MD    Allergies as of 07/14/2017 - Review Complete 07/14/2017  Allergen Reaction Noted  . Hydromorphone hcl Other (See Comments)     Family History  Problem  Relation Age of Onset  . Blindness Father   . Bone cancer Father   . Brain cancer Sister   . Alzheimer's disease Brother   . Colon cancer Neg Hx     Social History   Socioeconomic History  . Marital status: Divorced    Spouse name: Not on file  . Number of children: Not on file  . Years of education: Not on file  . Highest education level: Not on file  Social Needs  . Financial resource strain: Not on file  . Food insecurity - worry: Not on file  . Food insecurity - inability: Not on file  . Transportation needs - medical: Not on file  . Transportation needs - non-medical: Not on file  Occupational History  . Not on file  Tobacco Use  . Smoking status: Never Smoker  . Smokeless tobacco: Never Used  Substance and Sexual Activity   . Alcohol use: No  . Drug use: No  . Sexual activity: No  Other Topics Concern  . Not on file  Social History Narrative  . Not on file    Review of Systems: See HPI, otherwise negative ROS   Physical Exam: BP (!) 89/42 (BP Location: Right Arm)   Pulse 82   Temp 98.2 F (36.8 C) (Oral)   Resp 20   Ht 5\' 3"  (1.6 m)   Wt 174 lb 9.7 oz (79.2 kg)   SpO2 98%   BMI 30.93 kg/m  General:   Alert,  pleasant and cooperative in NAD Head:  Normocephalic and atraumatic. Neck:  Supple; Lungs:  Clear throughout to auscultation.    Heart:  Regular rate and rhythm. Abdomen:  Soft, nontender and nondistended. Normal bowel sounds, without guarding, and without rebound.   Neurologic:  Alert and  oriented x4;  grossly normal neurologically.  Impression/Plan:    CBD OBSTRUCTION/?STONE V. DISTAL CBD MASS  PLAN: Ercp/SPHINCTEROTOMY/POSSIBLE STONE EXTRACTION AND BILE DUCT BRUSHING/STENT PLACEMENT. DISCUSSED PROCEDURE, BENEFITS, & RISKS: < 1% chance of medication reaction, bleeding, perforation, or rupture of spleen/liver AND 5% RISK OF PANCREATITIS.

## 2017-07-17 ENCOUNTER — Encounter (HOSPITAL_COMMUNITY): Payer: Self-pay | Admitting: Gastroenterology

## 2017-07-17 DIAGNOSIS — I4891 Unspecified atrial fibrillation: Secondary | ICD-10-CM

## 2017-07-17 DIAGNOSIS — R945 Abnormal results of liver function studies: Secondary | ICD-10-CM

## 2017-07-17 LAB — GLUCOSE, CAPILLARY
GLUCOSE-CAPILLARY: 142 mg/dL — AB (ref 65–99)
GLUCOSE-CAPILLARY: 226 mg/dL — AB (ref 65–99)
Glucose-Capillary: 161 mg/dL — ABNORMAL HIGH (ref 65–99)
Glucose-Capillary: 216 mg/dL — ABNORMAL HIGH (ref 65–99)

## 2017-07-17 LAB — CBC WITH DIFFERENTIAL/PLATELET
BASOS ABS: 0 10*3/uL (ref 0.0–0.1)
BASOS PCT: 0 %
Eosinophils Absolute: 0 10*3/uL (ref 0.0–0.7)
Eosinophils Relative: 0 %
HCT: 36.2 % (ref 36.0–46.0)
HEMOGLOBIN: 11.8 g/dL — AB (ref 12.0–15.0)
LYMPHS PCT: 14 %
Lymphs Abs: 1.3 10*3/uL (ref 0.7–4.0)
MCH: 31.4 pg (ref 26.0–34.0)
MCHC: 32.6 g/dL (ref 30.0–36.0)
MCV: 96.3 fL (ref 78.0–100.0)
MONO ABS: 0.7 10*3/uL (ref 0.1–1.0)
MONOS PCT: 7 %
NEUTROS PCT: 79 %
Neutro Abs: 7 10*3/uL (ref 1.7–7.7)
Platelets: 185 10*3/uL (ref 150–400)
RBC: 3.76 MIL/uL — ABNORMAL LOW (ref 3.87–5.11)
RDW: 14.1 % (ref 11.5–15.5)
WBC: 9 10*3/uL (ref 4.0–10.5)

## 2017-07-17 LAB — COMPREHENSIVE METABOLIC PANEL
ALBUMIN: 2.8 g/dL — AB (ref 3.5–5.0)
ALT: 93 U/L — ABNORMAL HIGH (ref 14–54)
ANION GAP: 8 (ref 5–15)
AST: 51 U/L — AB (ref 15–41)
Alkaline Phosphatase: 182 U/L — ABNORMAL HIGH (ref 38–126)
BUN: 35 mg/dL — AB (ref 6–20)
CHLORIDE: 104 mmol/L (ref 101–111)
CO2: 23 mmol/L (ref 22–32)
Calcium: 8.4 mg/dL — ABNORMAL LOW (ref 8.9–10.3)
Creatinine, Ser: 1.62 mg/dL — ABNORMAL HIGH (ref 0.44–1.00)
GFR calc Af Amer: 32 mL/min — ABNORMAL LOW (ref >60)
GFR calc non Af Amer: 28 mL/min — ABNORMAL LOW (ref >60)
GLUCOSE: 180 mg/dL — AB (ref 65–99)
POTASSIUM: 4.4 mmol/L (ref 3.5–5.1)
SODIUM: 135 mmol/L (ref 135–145)
Total Bilirubin: 1.5 mg/dL — ABNORMAL HIGH (ref 0.3–1.2)
Total Protein: 5.1 g/dL — ABNORMAL LOW (ref 6.5–8.1)

## 2017-07-17 LAB — LIPASE, BLOOD: Lipase: 20 U/L (ref 11–51)

## 2017-07-17 NOTE — Progress Notes (Signed)
Subjective: She says she feels okay.  She tolerated full liquids.  Liver testing is mildly elevated from the last test.  She is asymptomatic from the atrial fibrillation  Objective: Vital signs in last 24 hours: Temp:  [97.5 F (36.4 C)-98.5 F (36.9 C)] 97.5 F (36.4 C) (11/21 0604) Pulse Rate:  [67-98] 67 (11/21 0604) Resp:  [13-22] 16 (11/21 0604) BP: (93-122)/(54-82) 105/59 (11/21 0604) SpO2:  [93 %-100 %] 98 % (11/21 0604) Weight change:  Last BM Date: 07/13/17  Intake/Output from previous day: 11/20 0701 - 11/21 0700 In: 920 [P.O.:120; I.V.:800] Out: -   PHYSICAL EXAM General appearance: alert, cooperative and no distress Resp: clear to auscultation bilaterally Cardio: Mildly irregular with a rate in the 60s GI: Mild right upper quadrant tenderness Extremities: extremities normal, atraumatic, no cyanosis or edema Very poor vision  Lab Results:  Results for orders placed or performed during the hospital encounter of 07/14/17 (from the past 48 hour(s))  Glucose, capillary     Status: Abnormal   Collection Time: 07/15/17 11:52 AM  Result Value Ref Range   Glucose-Capillary 119 (H) 65 - 99 mg/dL  Glucose, capillary     Status: Abnormal   Collection Time: 07/15/17  4:49 PM  Result Value Ref Range   Glucose-Capillary 187 (H) 65 - 99 mg/dL  Glucose, capillary     Status: Abnormal   Collection Time: 07/15/17  9:03 PM  Result Value Ref Range   Glucose-Capillary 149 (H) 65 - 99 mg/dL   Comment 1 Notify RN    Comment 2 Document in Chart   Glucose, capillary     Status: Abnormal   Collection Time: 07/16/17  7:21 AM  Result Value Ref Range   Glucose-Capillary 138 (H) 65 - 99 mg/dL   Comment 1 Notify RN    Comment 2 Document in Chart   Glucose, capillary     Status: Abnormal   Collection Time: 07/16/17  8:48 AM  Result Value Ref Range   Glucose-Capillary 164 (H) 65 - 99 mg/dL  Glucose, capillary     Status: Abnormal   Collection Time: 07/16/17 12:59 PM  Result Value  Ref Range   Glucose-Capillary 249 (H) 65 - 99 mg/dL  Glucose, capillary     Status: Abnormal   Collection Time: 07/16/17  4:41 PM  Result Value Ref Range   Glucose-Capillary 220 (H) 65 - 99 mg/dL  Glucose, capillary     Status: Abnormal   Collection Time: 07/16/17  9:01 PM  Result Value Ref Range   Glucose-Capillary 218 (H) 65 - 99 mg/dL  Comprehensive metabolic panel     Status: Abnormal   Collection Time: 07/17/17  4:09 AM  Result Value Ref Range   Sodium 135 135 - 145 mmol/L   Potassium 4.4 3.5 - 5.1 mmol/L   Chloride 104 101 - 111 mmol/L   CO2 23 22 - 32 mmol/L   Glucose, Bld 180 (H) 65 - 99 mg/dL   BUN 35 (H) 6 - 20 mg/dL   Creatinine, Ser 1.62 (H) 0.44 - 1.00 mg/dL   Calcium 8.4 (L) 8.9 - 10.3 mg/dL   Total Protein 5.1 (L) 6.5 - 8.1 g/dL   Albumin 2.8 (L) 3.5 - 5.0 g/dL   AST 51 (H) 15 - 41 U/L   ALT 93 (H) 14 - 54 U/L   Alkaline Phosphatase 182 (H) 38 - 126 U/L   Total Bilirubin 1.5 (H) 0.3 - 1.2 mg/dL   GFR calc non Af Amer 28 (  L) >60 mL/min   GFR calc Af Amer 32 (L) >60 mL/min    Comment: (NOTE) The eGFR has been calculated using the CKD EPI equation. This calculation has not been validated in all clinical situations. eGFR's persistently <60 mL/min signify possible Chronic Kidney Disease.    Anion gap 8 5 - 15  Lipase, blood     Status: None   Collection Time: 07/17/17  4:09 AM  Result Value Ref Range   Lipase 20 11 - 51 U/L  CBC with Differential/Platelet     Status: Abnormal   Collection Time: 07/17/17  4:09 AM  Result Value Ref Range   WBC 9.0 4.0 - 10.5 K/uL   RBC 3.76 (L) 3.87 - 5.11 MIL/uL   Hemoglobin 11.8 (L) 12.0 - 15.0 g/dL   HCT 36.2 36.0 - 46.0 %   MCV 96.3 78.0 - 100.0 fL   MCH 31.4 26.0 - 34.0 pg   MCHC 32.6 30.0 - 36.0 g/dL   RDW 14.1 11.5 - 15.5 %   Platelets 185 150 - 400 K/uL   Neutrophils Relative % 79 %   Neutro Abs 7.0 1.7 - 7.7 K/uL   Lymphocytes Relative 14 %   Lymphs Abs 1.3 0.7 - 4.0 K/uL   Monocytes Relative 7 %   Monocytes  Absolute 0.7 0.1 - 1.0 K/uL   Eosinophils Relative 0 %   Eosinophils Absolute 0.0 0.0 - 0.7 K/uL   Basophils Relative 0 %   Basophils Absolute 0.0 0.0 - 0.1 K/uL  Glucose, capillary     Status: Abnormal   Collection Time: 07/17/17  7:47 AM  Result Value Ref Range   Glucose-Capillary 161 (H) 65 - 99 mg/dL   Comment 1 Notify RN    Comment 2 Document in Chart     ABGS No results for input(s): PHART, PO2ART, TCO2, HCO3 in the last 72 hours.  Invalid input(s): PCO2 CULTURES No results found for this or any previous visit (from the past 240 hour(s)). Studies/Results: Ct Chest Wo Contrast  Result Date: 07/15/2017 CLINICAL DATA:  Followup abnormal MRCP suggesting possible aortic dissection. EXAM: CT CHEST WITHOUT CONTRAST TECHNIQUE: Multidetector CT imaging of the chest was performed following the standard protocol without IV contrast. COMPARISON:  MRCP 07/15/2017 FINDINGS: Cardiovascular: The heart is normal in size for age. No pericardial effusion. Advanced atherosclerotic calcifications involving the tortuous thoracic aorta. No obvious displaced intimal calcifications or other findings to definitely confirm the thoracic aortic dissection but the MR findings are quite convincing. There are three-vessel coronary artery calcifications. The pulmonary arteries are enlarged suggesting pulmonary hypertension. Mediastinum/Nodes: Small scattered mediastinal and hilar lymph nodes but no mass or adenopathy. The esophagus is grossly normal. Lungs/Pleura: Mild emphysematous changes but no acute pulmonary findings. Patchy sub solid nodular lesion in the right lower lobe on image number 85. This measures 13.5 x 10 mm. On the coronal and sagittal images this has more the appearance of a cluster of small nodules can could reflect focal area of inflammation or atypical infection could. Recommend followup noncontrast chest CT in 3 months to reassess. Left lower lobe scarring changes are noted. Upper Abdomen: No  significant upper abdominal findings other than the intra and extrahepatic biliary dilatation seen on the MRCP. Musculoskeletal: No breast masses, supraclavicular or axillary adenopathy. The bony structures are unremarkable. Scoliosis and degenerative changes are noted in the thoracic spine. IMPRESSION: 1. No obvious thoracic aortic dissection on this non contrasted CT scan but the MR findings are quite convincing. 2. Three-vessel  coronary artery calcifications. 3. Patchy nodular opacity in the right lower lobe as discussed above. Recommend follow-up noncontrast chest CT in 3 months. 4. No mediastinal or hilar mass or adenopathy. 5. Stable intra and extrahepatic biliary dilatation. Aortic Atherosclerosis (ICD10-I70.0) and Emphysema (ICD10-J43.9). Electronically Signed   By: Marijo Sanes M.D.   On: 07/15/2017 12:41   Mr Abdomen Mrcp Wo Contrast  Result Date: 07/15/2017 CLINICAL DATA:  83 year old with epigastric, periumbilical and right lower quadrant abdominal pain for 2 days. Biliary dilatation on CT. History of diabetes and chronic kidney disease. Mildly elevated liver function studies. EXAM: MRI ABDOMEN WITHOUT CONTRAST  (INCLUDING MRCP) TECHNIQUE: Multiplanar multisequence MR imaging of the abdomen was performed. Heavily T2-weighted images of the biliary and pancreatic ducts were obtained, and three-dimensional MRCP images were rendered by post processing. COMPARISON:  Abdominal CT 07/14/2017 and 03/30/2005. Chest CT 12/10/2004. FINDINGS: Despite efforts by the technologist and patient, mild motion artifact is present on today's exam and could not be eliminated. This reduces exam sensitivity and specificity. Lower chest:  The visualized lower chest appears unremarkable. Hepatobiliary: Allowing for the motion artifact and lack of contrast, the liver appears unremarkable aside from marked intra and extrahepatic biliary dilatation as seen on recent CT. The common hepatic duct measures up to 23 mm in diameter.  There is a large tubular shaped filling defect within the common bile duct which measures up to 4.3 cm in length on coronal image 81 of series 5 and up to 1.6 cm transverse (image 28 of series 17). This demonstrates heterogeneous areas of decreased T2 signal, especially inferiorly. No definite ampullary mass. Previous cholecystectomy. Pancreas: Atrophied with fatty replacement. No pancreatic ductal dilatation or surrounding inflammation. Spleen: Normal in size without focal abnormality. Adrenals/Urinary Tract: Both adrenal glands appear normal. Both kidneys demonstrate cortical thinning and cyst formation in the sinus fat. No hydronephrosis or suspicious cortical lesion. Stomach/Bowel: No evidence of bowel wall thickening, distention or surrounding inflammatory change.There is diffuse colonic diverticulosis. Vascular/Lymphatic: There are no enlarged abdominal lymph nodes. There is crescentic high T1 and high T2 signal along the right aspect of the distal thoracic aorta suspicious for aortic dissection. On the coronal images, this extends approximately 8.7 cm in length, without definite involvement of the aortic arch. This is incompletely evaluated by this noncontrast examination of the abdomen. Other: No ascites. Musculoskeletal: No acute or significant osseous findings. There are degenerative changes throughout the thoracolumbar spine associated with a mild scoliosis. IMPRESSION: 1. Marked intrahepatic and extrahepatic biliary dilatation with tubular filling defect in the common bile duct, likely a combination of gallstones and sludge. The degree of biliary dilatation suggests an underlying biliary stricture, and ERCP should be considered. No obvious ampullary mass. 2. Suspected dissection of the distal thoracic aorta, age indeterminate. This is incompletely visualized by the current examination. The patient's renal insufficiency likely precludes performance of contrast-enhanced CTA. Consider further evaluation  with noncontrast chest CT or MRA. 3. Bilateral renal cortical thinning.  No hydronephrosis. 4. These results will be called to the ordering clinician or representative by the Radiologist Assistant, and communication documented in the PACS or zVision Dashboard. Electronically Signed   By: Richardean Sale M.D.   On: 07/15/2017 10:40   Mr 3d Recon At Scanner  Result Date: 07/15/2017 CLINICAL DATA:  83 year old with epigastric, periumbilical and right lower quadrant abdominal pain for 2 days. Biliary dilatation on CT. History of diabetes and chronic kidney disease. Mildly elevated liver function studies. EXAM: MRI ABDOMEN WITHOUT CONTRAST  (INCLUDING MRCP)  TECHNIQUE: Multiplanar multisequence MR imaging of the abdomen was performed. Heavily T2-weighted images of the biliary and pancreatic ducts were obtained, and three-dimensional MRCP images were rendered by post processing. COMPARISON:  Abdominal CT 07/14/2017 and 03/30/2005. Chest CT 12/10/2004. FINDINGS: Despite efforts by the technologist and patient, mild motion artifact is present on today's exam and could not be eliminated. This reduces exam sensitivity and specificity. Lower chest:  The visualized lower chest appears unremarkable. Hepatobiliary: Allowing for the motion artifact and lack of contrast, the liver appears unremarkable aside from marked intra and extrahepatic biliary dilatation as seen on recent CT. The common hepatic duct measures up to 23 mm in diameter. There is a large tubular shaped filling defect within the common bile duct which measures up to 4.3 cm in length on coronal image 81 of series 5 and up to 1.6 cm transverse (image 28 of series 17). This demonstrates heterogeneous areas of decreased T2 signal, especially inferiorly. No definite ampullary mass. Previous cholecystectomy. Pancreas: Atrophied with fatty replacement. No pancreatic ductal dilatation or surrounding inflammation. Spleen: Normal in size without focal abnormality.  Adrenals/Urinary Tract: Both adrenal glands appear normal. Both kidneys demonstrate cortical thinning and cyst formation in the sinus fat. No hydronephrosis or suspicious cortical lesion. Stomach/Bowel: No evidence of bowel wall thickening, distention or surrounding inflammatory change.There is diffuse colonic diverticulosis. Vascular/Lymphatic: There are no enlarged abdominal lymph nodes. There is crescentic high T1 and high T2 signal along the right aspect of the distal thoracic aorta suspicious for aortic dissection. On the coronal images, this extends approximately 8.7 cm in length, without definite involvement of the aortic arch. This is incompletely evaluated by this noncontrast examination of the abdomen. Other: No ascites. Musculoskeletal: No acute or significant osseous findings. There are degenerative changes throughout the thoracolumbar spine associated with a mild scoliosis. IMPRESSION: 1. Marked intrahepatic and extrahepatic biliary dilatation with tubular filling defect in the common bile duct, likely a combination of gallstones and sludge. The degree of biliary dilatation suggests an underlying biliary stricture, and ERCP should be considered. No obvious ampullary mass. 2. Suspected dissection of the distal thoracic aorta, age indeterminate. This is incompletely visualized by the current examination. The patient's renal insufficiency likely precludes performance of contrast-enhanced CTA. Consider further evaluation with noncontrast chest CT or MRA. 3. Bilateral renal cortical thinning.  No hydronephrosis. 4. These results will be called to the ordering clinician or representative by the Radiologist Assistant, and communication documented in the PACS or zVision Dashboard. Electronically Signed   By: Richardean Sale M.D.   On: 07/15/2017 10:40   Dg Ercp Biliary & Pancreatic Ducts  Result Date: 07/16/2017 CLINICAL DATA:  Stone removal EXAM: ERCP TECHNIQUE: Multiple spot images obtained with the  fluoroscopic device and submitted for interpretation post-procedure. FLUOROSCOPY TIME:  Fluoroscopy Time:  9 minutes and 48 seconds Radiation Exposure Index (if provided by the fluoroscopic device): Number of Acquired Spot Images: 0 COMPARISON:  None. FINDINGS: Several images demonstrate cannulation of the common bile duct and balloon stone retrieval. The common bile duct remains markedly dilated. IMPRESSION: See above. These images were submitted for radiologic interpretation only. Please see the procedural report for the amount of contrast and the fluoroscopy time utilized. Electronically Signed   By: Marybelle Killings M.D.   On: 07/16/2017 13:09    Medications:  Prior to Admission:  Medications Prior to Admission  Medication Sig Dispense Refill Last Dose  . amLODipine (NORVASC) 5 MG tablet Take 5 mg by mouth daily.  07/14/2017 at Unknown time  . busPIRone (BUSPAR) 5 MG tablet Take 5 mg 2 (two) times daily as needed by mouth.   07/13/2017 at Unknown time  . cholecalciferol (VITAMIN D) 1000 units tablet Take 1,000 Units daily by mouth.   07/14/2017 at Unknown time  . dorzolamide (TRUSOPT) 2 % ophthalmic solution Place 1 drop into both eyes 2 (two) times daily.     07/14/2017 at Unknown time  . furosemide (LASIX) 20 MG tablet Take 20 mg daily as needed by mouth for fluid.   Past Month at Unknown time  . glipiZIDE (GLUCOTROL) 5 MG tablet Take 5 mg 2 (two) times daily by mouth.    07/14/2017 at Unknown time  . hydrocortisone cream 1 % Apply 2 (two) times daily topically. 30 g 0 Past Week at Unknown time  . omeprazole (PRILOSEC) 20 MG capsule Take 20 mg by mouth daily.     07/14/2017 at Unknown time  . pravastatin (PRAVACHOL) 40 MG tablet Take 40 mg by mouth daily.     07/14/2017 at Unknown time  . traMADol (ULTRAM) 50 MG tablet Take 1 tablet (50 mg total) by mouth every 6 (six) hours as needed for moderate pain. 30 tablet 0 07/13/2017 at Unknown time  . acetaminophen (TYLENOL) 500 MG tablet Take 500 mg  by mouth every 6 (six) hours as needed for mild pain or moderate pain.   Not Taking at Unknown time  . brimonidine (ALPHAGAN P) 0.1 % SOLN Place 1 drop into both eyes 2 (two) times daily.     Not Taking at Unknown time  . ipratropium-albuterol (DUONEB) 0.5-2.5 (3) MG/3ML SOLN Take 3 mLs by nebulization every 4 (four) hours as needed. Shortness of Breath   06/30/2017 at Unknown time  . latanoprost (XALATAN) 0.005 % ophthalmic solution Place 1 drop into both eyes at bedtime.     Not Taking at Unknown time  . levofloxacin (LEVAQUIN) 500 MG tablet Take 1 tablet (500 mg total) every other day by mouth. 5 tablet 0 07/12/2017  . oxyCODONE-acetaminophen (PERCOCET/ROXICET) 5-325 MG per tablet Take 1 tablet by mouth every 6 (six) hours as needed for severe pain. (Patient not taking: Reported on 07/14/2017) 20 tablet 0 Not Taking   Scheduled: . amLODipine  5 mg Oral Daily  . brimonidine  1 drop Both Eyes BID  . dorzolamide  1 drop Both Eyes BID  . insulin aspart  0-15 Units Subcutaneous TID WC  . insulin aspart  0-5 Units Subcutaneous QHS  . latanoprost  1 drop Both Eyes QHS  . LORazepam  1 mg Intravenous Once  . mouth rinse  15 mL Mouth Rinse BID  . pantoprazole  40 mg Oral Daily  . pravastatin  40 mg Oral Daily   Continuous:  WIO:[id number] **OR** acetaminophen, busPIRone, ipratropium-albuterol, morphine injection, ondansetron **OR** ondansetron (ZOFRAN) IV  Assesment: She had a stricture in the common bile duct and had sphincterotomy yesterday.  She is doing okay.  Liver function testing went up a little bit from previous.  She is tolerated full liquids okay.  She has atrial fib with well-controlled ventricular response.  Cardiology has seen her. Principal Problem:   Common bile duct dilatation Active Problems:   Type 2 diabetes mellitus ([facility])   COPD (chronic obstructive pulmonary disease) ([facility])   Glaucoma   Essential hypertension   Elevated LFTs   CKD (chronic kidney disease) stage 3, GFR  30-59 ml/min ([facility])   Choledocholithiasis    Plan: Continue treatments.  Will defer  to GI regarding advancement of diet and when she can go home.    LOS: 3 days   Merelyn Klump L 07/17/2017, 8:56 AM

## 2017-07-17 NOTE — Care Management (Signed)
Potential DC home tomorrow (Thanksgiving). CM provided pt with voucher for 30-day free Eliquis. Does not plan to begin Eliquis until 11/27 per MD recommendations. Dtr at bedside. No needs or concerns about DC plan home with self care.

## 2017-07-17 NOTE — Anesthesia Postprocedure Evaluation (Signed)
Anesthesia Post Note  Patient: [name]  Procedure(s) Performed: ENDOSCOPIC RETROGRADE CHOLANGIOPANCREATOGRAPHY (ERCP) BALLOON EXTRACTION SLUDGE/STONE REMOVAL (N/A ) SPHINCTEROTOMY BALLOON DILATION  Patient location during evaluation: Nursing Unit Anesthesia Type: General Level of consciousness: awake and alert and patient cooperative Pain management: pain level controlled Vital Signs Assessment: post-procedure vital signs reviewed and stable Respiratory status: spontaneous breathing, nonlabored ventilation and respiratory function stable Cardiovascular status: blood pressure returned to baseline Postop Assessment: no apparent nausea or vomiting Anesthetic complications: no     Last Vitals:  Vitals:   07/16/17 2126 07/17/17 0604  BP:  (!) 105/59  Pulse:  67  Resp:  16  Temp:  (!) 36.4 C  SpO2: 93% 98%    Last Pain:  Vitals:   07/17/17 0756  TempSrc:   PainSc: 4                  Lauramae Kneisley J

## 2017-07-17 NOTE — Care Management Important Message (Signed)
Important Message  Patient Details  Name: [name] MRN: [id number] Date of Birth: [birth year]   Medicare Important Message Given:  Yes    Sherald Barge, RN 07/17/2017, 2:30 PM

## 2017-07-17 NOTE — Consult Note (Signed)
Cardiology Consult    Patient ID: [name]; [id number]; 12-10-1928   Admit date: 07/14/2017 Date of Consult: 07/17/2017  Primary Care Provider: Sinda Du, MD Primary Cardiologist: New to [facility] - Dr. Harl Bowie  Patient Profile    [name] is a 83 y.o. female with past medical history of HTN, HLD, Type 2 DM, Stage 3 CKD, and COPD who is being seen today for the evaluation of atrial fibrillation at the request of Dr. Luan Pulling.   History of Present Illness    [name] was recently admitted to [facility] from 11/4- 07/05/2017 for CAP. Hospital course was overall uncomplicated. She did develop a rash which was though to be due to Shingles and this was appropriately treated.   She presented back on 07/14/2017 for evaluation of abdominal pain which was exacerbated with consuming food. CT Abdomen on admission showed a marked common bile duct dilatation, measuring up to 2.5 cm in diameter. MRCP confirmed a filling defect in the common bile duct and an ERCP was pursued yesterday afternoon and she was found to have choledocholithiasis with complete removal performed with biliary sphincterotomy and balloon extraction. During the procedure, she was noted to have gone into atrial fibrillation.   She has been monitored on telemetry overnight and HR has remained well-controlled in the mid-50's to 70's with occasional pauses up to 2.14 seconds overnight. Echo showed a preserved EF of 65-70% with no regional WMA. LA was noted to be severely dilated.   Chest CT was performed this admission as well and showed no evidence of a dissection. She was noted to have 2-vessel coronary calcifications with patchy nodular opacities noted along the RLL.   In talking with the patient today, she is unaware of any prior cardiac history. No known CAD or prior cardiac arrhythmias. She is unaware of her arrhythmia. Denies any recent chest pain, dyspnea on exertion, palpitations, orthopnea, or lower extremity  edema. Reports her abdominal pain has significantly improved following her procedure yesterday.   Past Medical History:  Diagnosis Date  . Acid reflux   . Anxiety   . CKD (chronic kidney disease)   . COPD (chronic obstructive pulmonary disease) ([place])   . Depressed   . Diabetes mellitus   . Glaucoma   . Hypercholesteremia   . Hypertension   . Legally blind in right eye, as defined in [place]   . Panic attacks   . Type 2 diabetes mellitus ([place])     Past Surgical History:  Procedure Laterality Date  . APPENDECTOMY    . BALLOON DILATION  07/16/2017   Procedure: BALLOON DILATION;  Surgeon: Danie Binder, MD;  [facility]: [facility];  Service: Endoscopy;;  . cataracts    . CHOLECYSTECTOMY    . ERCP N/A 07/16/2017   Procedure: ENDOSCOPIC RETROGRADE CHOLANGIOPANCREATOGRAPHY (ERCP) BALLOON EXTRACTION SLUDGE/STONE REMOVAL;  Surgeon: Danie Binder, MD;  Location: [facility];  Service: Endoscopy;  Laterality: N/A;  . ESOPHAGOGASTRODUODENOSCOPY  2006   Dr. Gala Romney: normal esophagus s/p empiric dilataton, small hiatal hernia  . HIP SURGERY     rod in leg as well  . SPHINCTEROTOMY  07/16/2017   Procedure: SPHINCTEROTOMY;  Surgeon: Danie Binder, MD;  [facility]: [facility];  Service: Endoscopy;;  . TUBAL LIGATION       Home Medications:  Prior to Admission medications   Medication Sig Start Date End Date Taking? Authorizing Provider  amLODipine (NORVASC) 5 MG tablet Take 5 mg by mouth daily.  Yes [provider]  busPIRone (BUSPAR) 5 MG tablet Take 5 mg 2 (two) times daily as needed by mouth.   Yes [provider]  cholecalciferol (VITAMIN D) 1000 units tablet Take 1,000 Units daily by mouth.   Yes [provider]  dorzolamide (TRUSOPT) 2 % ophthalmic solution Place 1 drop into both eyes 2 (two) times daily.     Yes [provider]  furosemide (LASIX) 20 MG tablet Take 20 mg daily as needed by mouth for fluid.   Yes [provider]    glipiZIDE (GLUCOTROL) 5 MG tablet Take 5 mg 2 (two) times daily by mouth.    Yes [provider]  hydrocortisone cream 1 % Apply 2 (two) times daily topically. 07/05/17  Yes Sinda Du, MD  omeprazole (PRILOSEC) 20 MG capsule Take 20 mg by mouth daily.     Yes [provider]  pravastatin (PRAVACHOL) 40 MG tablet Take 40 mg by mouth daily.     Yes [provider]  traMADol (ULTRAM) 50 MG tablet Take 1 tablet (50 mg total) by mouth every 6 (six) hours as needed for moderate pain. 04/05/15  Yes Dorie Rank, MD  acetaminophen (TYLENOL) 500 MG tablet Take 500 mg by mouth every 6 (six) hours as needed for mild pain or moderate pain.    [provider]  brimonidine (ALPHAGAN P) 0.1 % SOLN Place 1 drop into both eyes 2 (two) times daily.      [provider]  ipratropium-albuterol (DUONEB) 0.5-2.5 (3) MG/3ML SOLN Take 3 mLs by nebulization every 4 (four) hours as needed. Shortness of Breath    [provider]  latanoprost (XALATAN) 0.005 % ophthalmic solution Place 1 drop into both eyes at bedtime.      [provider]  levofloxacin (LEVAQUIN) 500 MG tablet Take 1 tablet (500 mg total) every other day by mouth. 07/06/17   Sinda Du, MD  oxyCODONE-acetaminophen (PERCOCET/ROXICET) 5-325 MG per tablet Take 1 tablet by mouth every 6 (six) hours as needed for severe pain. Patient not taking: Reported on 07/14/2017 04/06/15   Rolland Porter, MD    Inpatient Medications: Scheduled Meds: . amLODipine  5 mg Oral Daily  . brimonidine  1 drop Both Eyes BID  . dorzolamide  1 drop Both Eyes BID  . insulin aspart  0-15 Units Subcutaneous TID WC  . insulin aspart  0-5 Units Subcutaneous QHS  . latanoprost  1 drop Both Eyes QHS  . LORazepam  1 mg Intravenous Once  . mouth rinse  15 mL Mouth Rinse BID  . pantoprazole  40 mg Oral Daily  . pravastatin  40 mg Oral Daily   Continuous Infusions:  PRN Meds: acetaminophen **OR** acetaminophen,  busPIRone, ipratropium-albuterol, morphine injection, ondansetron **OR** ondansetron (ZOFRAN) IV  Allergies:    Allergies  Allergen Reactions  . Hydromorphone Hcl Other (See Comments)    Patient goes out of right state of mind.     Social History:   Social History   Socioeconomic History  . Marital status: Divorced    Spouse name: Not on file  . Number of children: Not on file  . Years of education: Not on file  . Highest education level: Not on file  Social Needs  . Financial resource strain: Not on file  . Food insecurity - worry: Not on file  . Food insecurity - inability: Not on file  . Transportation needs - medical: Not on file  . Transportation needs - non-medical: Not  on file  Occupational History  . Not on file  Tobacco Use  . Smoking status: Never Smoker  . Smokeless tobacco: Never Used  Substance and Sexual Activity  . Alcohol use: No  . Drug use: No  . Sexual activity: No  Other Topics Concern  . Not on file  Social History Narrative  . Not on file     Family History:    Family History  Problem Relation Age of Onset  . Blindness Father   . Bone cancer Father   . Brain cancer Sister   . Alzheimer's disease Brother   . Colon cancer Neg Hx       Review of Systems    General:  No chills, fever, night sweats or weight changes.  Cardiovascular:  No chest pain, dyspnea on exertion, edema, orthopnea, palpitations, paroxysmal nocturnal dyspnea. Dermatological: No rash, lesions/masses Respiratory: No cough, dyspnea Urologic: No hematuria, dysuria Abdominal:   No nausea, vomiting, diarrhea, bright red blood per rectum, melena, or hematemesis. Positive for abdominal pain.  Neurologic:  No visual changes, wkns, changes in mental status.  All other systems reviewed and are otherwise negative except as noted above.  Physical Exam/Data    Vitals:   07/16/17 1509 07/16/17 2102 07/16/17 2126 07/17/17 0604  BP:  (!) 93/54  (!) 105/59  Pulse:  93  67    Resp:  16  16  Temp:  (!) 97.5 F (36.4 C)  (!) 97.5 F (36.4 C)  TempSrc:  Oral  Oral  SpO2: 100% 96% 93% 98%  Weight:      Height:        Intake/Output Summary (Last 24 hours) at 07/17/2017 0906 Last data filed at 07/16/2017 2145 Gross per 24 hour  Intake 920 ml  Output -  Net 920 ml   Filed Weights   07/14/17 1256 07/14/17 1900  Weight: 179 lb (81.2 kg) 174 lb 9.7 oz (79.2 kg)   Body mass index is 30.93 kg/m.   General: Pleasant elderly Caucasian female appearing in NAD Psych: Normal affect. Neuro: Alert and oriented X 3. Moves all extremities spontaneously. HEENT: Normal  Neck: Supple without bruits or JVD. Lungs:  Resp regular and unlabored, CTA without wheezing or rales. Heart: Irregularly irregular no s3, s4, or murmurs. Abdomen: Soft, non-tender, non-distended, BS + x 4.  Extremities: No clubbing, cyanosis or edema. DP/PT/Radials 2+ and equal bilaterally.   EKG:  The EKG was personally reviewed and demonstrates: 11/20: NSR, HR 64 Telemetry:  Telemetry was personally reviewed and demonstrates: Atrial fibrillation,. HR in 50's - 70's.    Labs/Studies     Relevant CV Studies:  Echocardiogram: 07/16/2017 Study Conclusions  - Left ventricle: The cavity size was normal. Wall thickness was   increased in a pattern of severe LVH. Systolic function was   vigorous. The estimated ejection fraction was in the range of 65%   to 70%. Wall motion was normal; there were no regional wall   motion abnormalities. - Aortic valve: Valve area (VTI): 2.32 cm^2. Valve area (Vmax): 2.2   cm^2. Valve area (Vmean): 2.02 cm^2. - Left atrium: The atrium was severely dilated. - Technically difficult study. Echocontrast was used to enhance   visualization.  Laboratory Data:  Chemistry Recent Labs  Lab 07/14/17 1311 07/15/17 0614 07/17/17 0409  NA 134* 136 135  K 4.0 3.8 4.4  CL 103 102 104  CO2 22 25 23   GLUCOSE 367* 193* 180*  BUN 37* 40* 35*  CREATININE 1.75*  1.85* 1.62*  CALCIUM 8.6* 8.3* 8.4*  GFRNONAA 25* 24* 28*  GFRAA 29* 27* 32*  ANIONGAP 9 9 8     Recent Labs  Lab 07/14/17 1311 07/15/17 0614 07/17/17 0409  PROT 5.7* 5.5* 5.1*  ALBUMIN 3.3* 3.1* 2.8*  AST 72* 35 51*  ALT 108* 77* 93*  ALKPHOS 152* 123 182*  BILITOT 2.1* 1.7* 1.5*   Hematology Recent Labs  Lab 07/14/17 1311 07/15/17 0614 07/17/17 0409  WBC 10.5 9.6 9.0  RBC 4.39 3.94 3.76*  HGB 13.7 12.5 11.8*  HCT 40.4 37.5 36.2  MCV 92.0 95.2 96.3  MCH 31.2 31.7 31.4  MCHC 33.9 33.3 32.6  RDW 13.7 13.9 14.1  PLT 163 164 185   Cardiac EnzymesNo results for input(s): TROPONINI in the last 168 hours. No results for input(s): TROPIPOC in the last 168 hours.  BNPNo results for input(s): BNP, PROBNP in the last 168 hours.  DDimer No results for input(s): DDIMER in the last 168 hours.  Radiology/Studies:  Ct Abdomen Pelvis Wo Contrast  Result Date: 07/14/2017 CLINICAL DATA:  Epigastric, periumbilical and right lower quadrant pain for 2 days. EXAM: CT ABDOMEN AND PELVIS WITHOUT CONTRAST TECHNIQUE: Multidetector CT imaging of the abdomen and pelvis was performed following the standard protocol without IV contrast. COMPARISON:  CT abdomen dated 03/30/2005. FINDINGS: Lower chest: No acute findings. Mild focal scarring/atelectasis at the right lung base. Hepatobiliary: No focal liver abnormality is seen. There is marked common bile duct dilatation, measuring up to approximately 2.5 cm. Gallbladder is not seen, presumed cholecystectomy. Pancreas: Fatty infiltration of the pancreas. Otherwise unremarkable. Spleen: Normal in size without focal abnormality. Adrenals/Urinary Tract: Adrenal glands are unremarkable. Parapelvic renal cysts bilaterally. No renal stone or hydronephrosis. No ureteral or bladder calculi identified. Bladder is unremarkable, partially decompressed. Stomach/Bowel: Extensive diverticulosis throughout the colon but no focal inflammatory change to suggest acute  diverticulitis. Stomach is unremarkable. No dilated large or small bowel loops. Appendix is not seen but there are no inflammatory changes about the cecum to suggest acute appendicitis. Vascular/Lymphatic: Aortic atherosclerosis. No enlarged lymph nodes seen. Reproductive: Presumed hysterectomy. No adnexal mass or inflammatory change. Other: No free fluid or abscess collection. No free intraperitoneal air. Musculoskeletal: Mild degenerative change of the slightly scoliotic thoracolumbar spine. No acute or suspicious osseous finding. Superficial soft tissues are unremarkable. IMPRESSION: 1. Marked common bile duct dilatation, measuring up to 2.5 cm diameter. No obstructing stone identified. Recommend correlation with liver function tests. Consider MRCP for further characterization. 2. Extensive colonic diverticulosis without evidence of acute diverticulitis. 3. Aortic atherosclerosis. 4. Remainder of the abdomen and pelvis is unremarkable, as detailed above. No bowel obstruction or evidence of bowel wall inflammation. No free fluid. No evidence of acute solid organ abnormality. No renal or ureteral calculi. Electronically Signed   By: Franki Cabot M.D.   On: 07/14/2017 15:58   Ct Chest Wo Contrast  Result Date: 07/15/2017 CLINICAL DATA:  Followup abnormal MRCP suggesting possible aortic dissection. EXAM: CT CHEST WITHOUT CONTRAST TECHNIQUE: Multidetector CT imaging of the chest was performed following the standard protocol without IV contrast. COMPARISON:  MRCP 07/15/2017 FINDINGS: Cardiovascular: The heart is normal in size for age. No pericardial effusion. Advanced atherosclerotic calcifications involving the tortuous thoracic aorta. No obvious displaced intimal calcifications or other findings to definitely confirm the thoracic aortic dissection but the MR findings are quite convincing. There are three-vessel coronary artery calcifications. The pulmonary arteries are enlarged suggesting pulmonary  hypertension. Mediastinum/Nodes: Small scattered mediastinal and hilar lymph nodes  but no mass or adenopathy. The esophagus is grossly normal. Lungs/Pleura: Mild emphysematous changes but no acute pulmonary findings. Patchy sub solid nodular lesion in the right lower lobe on image number 85. This measures 13.5 x 10 mm. On the coronal and sagittal images this has more the appearance of a cluster of small nodules can could reflect focal area of inflammation or atypical infection could. Recommend followup noncontrast chest CT in 3 months to reassess. Left lower lobe scarring changes are noted. Upper Abdomen: No significant upper abdominal findings other than the intra and extrahepatic biliary dilatation seen on the MRCP. Musculoskeletal: No breast masses, supraclavicular or axillary adenopathy. The bony structures are unremarkable. Scoliosis and degenerative changes are noted in the thoracic spine. IMPRESSION: 1. No obvious thoracic aortic dissection on this non contrasted CT scan but the MR findings are quite convincing. 2. Three-vessel coronary artery calcifications. 3. Patchy nodular opacity in the right lower lobe as discussed above. Recommend follow-up noncontrast chest CT in 3 months. 4. No mediastinal or hilar mass or adenopathy. 5. Stable intra and extrahepatic biliary dilatation. Aortic Atherosclerosis (ICD10-I70.0) and Emphysema (ICD10-J43.9). Electronically Signed   By: Marijo Sanes M.D.   On: 07/15/2017 12:41   Mr Abdomen Mrcp Wo Contrast  Result Date: 07/15/2017 CLINICAL DATA:  83 year old with epigastric, periumbilical and right lower quadrant abdominal pain for 2 days. Biliary dilatation on CT. History of diabetes and chronic kidney disease. Mildly elevated liver function studies. EXAM: MRI ABDOMEN WITHOUT CONTRAST  (INCLUDING MRCP) TECHNIQUE: Multiplanar multisequence MR imaging of the abdomen was performed. Heavily T2-weighted images of the biliary and pancreatic ducts were obtained, and  three-dimensional MRCP images were rendered by post processing. COMPARISON:  Abdominal CT 07/14/2017 and 03/30/2005. Chest CT 12/10/2004. FINDINGS: Despite efforts by the technologist and patient, mild motion artifact is present on today's exam and could not be eliminated. This reduces exam sensitivity and specificity. Lower chest:  The visualized lower chest appears unremarkable. Hepatobiliary: Allowing for the motion artifact and lack of contrast, the liver appears unremarkable aside from marked intra and extrahepatic biliary dilatation as seen on recent CT. The common hepatic duct measures up to 23 mm in diameter. There is a large tubular shaped filling defect within the common bile duct which measures up to 4.3 cm in length on coronal image 81 of series 5 and up to 1.6 cm transverse (image 28 of series 17). This demonstrates heterogeneous areas of decreased T2 signal, especially inferiorly. No definite ampullary mass. Previous cholecystectomy. Pancreas: Atrophied with fatty replacement. No pancreatic ductal dilatation or surrounding inflammation. Spleen: Normal in size without focal abnormality. Adrenals/Urinary Tract: Both adrenal glands appear normal. Both kidneys demonstrate cortical thinning and cyst formation in the sinus fat. No hydronephrosis or suspicious cortical lesion. Stomach/Bowel: No evidence of bowel wall thickening, distention or surrounding inflammatory change.There is diffuse colonic diverticulosis. Vascular/Lymphatic: There are no enlarged abdominal lymph nodes. There is crescentic high T1 and high T2 signal along the right aspect of the distal thoracic aorta suspicious for aortic dissection. On the coronal images, this extends approximately 8.7 cm in length, without definite involvement of the aortic arch. This is incompletely evaluated by this noncontrast examination of the abdomen. Other: No ascites. Musculoskeletal: No acute or significant osseous findings. There are degenerative changes  throughout the thoracolumbar spine associated with a mild scoliosis. IMPRESSION: 1. Marked intrahepatic and extrahepatic biliary dilatation with tubular filling defect in the common bile duct, likely a combination of gallstones and sludge. The degree of biliary dilatation suggests  an underlying biliary stricture, and ERCP should be considered. No obvious ampullary mass. 2. Suspected dissection of the distal thoracic aorta, age indeterminate. This is incompletely visualized by the current examination. The patient's renal insufficiency likely precludes performance of contrast-enhanced CTA. Consider further evaluation with noncontrast chest CT or MRA. 3. Bilateral renal cortical thinning.  No hydronephrosis. 4. These results will be called to the ordering clinician or representative by the Radiologist Assistant, and communication documented in the PACS or zVision Dashboard. Electronically Signed   By: Richardean Sale M.D.   On: 07/15/2017 10:40   Mr 3d Recon At Scanner  Result Date: 07/15/2017 CLINICAL DATA:  83 year old with epigastric, periumbilical and right lower quadrant abdominal pain for 2 days. Biliary dilatation on CT. History of diabetes and chronic kidney disease. Mildly elevated liver function studies. EXAM: MRI ABDOMEN WITHOUT CONTRAST  (INCLUDING MRCP) TECHNIQUE: Multiplanar multisequence MR imaging of the abdomen was performed. Heavily T2-weighted images of the biliary and pancreatic ducts were obtained, and three-dimensional MRCP images were rendered by post processing. COMPARISON:  Abdominal CT 07/14/2017 and 03/30/2005. Chest CT 12/10/2004. FINDINGS: Despite efforts by the technologist and patient, mild motion artifact is present on today's exam and could not be eliminated. This reduces exam sensitivity and specificity. Lower chest:  The visualized lower chest appears unremarkable. Hepatobiliary: Allowing for the motion artifact and lack of contrast, the liver appears unremarkable aside from  marked intra and extrahepatic biliary dilatation as seen on recent CT. The common hepatic duct measures up to 23 mm in diameter. There is a large tubular shaped filling defect within the common bile duct which measures up to 4.3 cm in length on coronal image 81 of series 5 and up to 1.6 cm transverse (image 28 of series 17). This demonstrates heterogeneous areas of decreased T2 signal, especially inferiorly. No definite ampullary mass. Previous cholecystectomy. Pancreas: Atrophied with fatty replacement. No pancreatic ductal dilatation or surrounding inflammation. Spleen: Normal in size without focal abnormality. Adrenals/Urinary Tract: Both adrenal glands appear normal. Both kidneys demonstrate cortical thinning and cyst formation in the sinus fat. No hydronephrosis or suspicious cortical lesion. Stomach/Bowel: No evidence of bowel wall thickening, distention or surrounding inflammatory change.There is diffuse colonic diverticulosis. Vascular/Lymphatic: There are no enlarged abdominal lymph nodes. There is crescentic high T1 and high T2 signal along the right aspect of the distal thoracic aorta suspicious for aortic dissection. On the coronal images, this extends approximately 8.7 cm in length, without definite involvement of the aortic arch. This is incompletely evaluated by this noncontrast examination of the abdomen. Other: No ascites. Musculoskeletal: No acute or significant osseous findings. There are degenerative changes throughout the thoracolumbar spine associated with a mild scoliosis. IMPRESSION: 1. Marked intrahepatic and extrahepatic biliary dilatation with tubular filling defect in the common bile duct, likely a combination of gallstones and sludge. The degree of biliary dilatation suggests an underlying biliary stricture, and ERCP should be considered. No obvious ampullary mass. 2. Suspected dissection of the distal thoracic aorta, age indeterminate. This is incompletely visualized by the current  examination. The patient's renal insufficiency likely precludes performance of contrast-enhanced CTA. Consider further evaluation with noncontrast chest CT or MRA. 3. Bilateral renal cortical thinning.  No hydronephrosis. 4. These results will be called to the ordering clinician or representative by the Radiologist Assistant, and communication documented in the PACS or zVision Dashboard. Electronically Signed   By: Richardean Sale M.D.   On: 07/15/2017 10:40   Dg Ercp Biliary & Pancreatic Ducts  Result Date: 07/16/2017  CLINICAL DATA:  Stone removal EXAM: ERCP TECHNIQUE: Multiple spot images obtained with the fluoroscopic device and submitted for interpretation post-procedure. FLUOROSCOPY TIME:  Fluoroscopy Time:  9 minutes and 48 seconds Radiation Exposure Index (if provided by the fluoroscopic device): Number of Acquired Spot Images: 0 COMPARISON:  None. FINDINGS: Several images demonstrate cannulation of the common bile duct and balloon stone retrieval. The common bile duct remains markedly dilated. IMPRESSION: See above. These images were submitted for radiologic interpretation only. Please see the procedural report for the amount of contrast and the fluoroscopy time utilized. Electronically Signed   By: Marybelle Killings M.D.   On: 07/16/2017 13:09     Assessment & Plan    1. New-onset Atrial Fibrillation - the patient was in NSR upon admission but was found to have gone into atrial fibrillation during her procedure yesterday afternoon. No reported history of atrial fibrillation prior to this.  - she has been monitored on telemetry overnight and HR has remained well-controlled in the mid-50's to 70's with occasional pauses up to 2.14 seconds overnight. Not currently on any AV nodal blocking agents. With her well-controlled rate and episodes of bradycardia, would not initiate Cardizem or BB therapy at this time. Recommended checking HR with ambulation. Her echo did show a preserved EF of 65-70% with no  regional WMA but LA was noted to be severely dilated which raises concern that she might have been having prior episodes of AF but was unaware as she is asymptomatic with the rhythm.  - This patients CHA2DS2-VASc Score and unadjusted Ischemic Stroke Rate (% per year) is equal to 9.7 % stroke rate/year from a score of 6 (HTN, DM, Vascular - Coronary Calcifications by CT, Female, Age (2)). Per review from GI notes, could restart Heparin without a bolus if needed in 12-24 hours but no anti-coagulation due to recent sphincterotomy. Would recommend outpatient follow-up to see whether or not she remains in atrial fibrillation or experiences spontaneous conversion to NSR. With her high stroke risk, would recommend full-dose anticoagulation once safe from a GI perspective if she does not convert or has recurrence of the arrhythmia.   2. HTN - BP has been well-controlled at 93/54 - 122/82 within the past 24 hours.  - continue Amlodipine 5mg  daily.   3. HLD - followed by PCP. Remains on Pravastatin 40mg  daily.   4. Stage 3 CKD - baseline creatinine 1.5 - 1.6. Stable at 1.62 this AM.   5. Abdominal Pain/ Choledocholithiasis - ERCP was performed on 11/20 and she was found to have choledocholithiasis with complete removal performed with biliary sphincterotomy and balloon extraction.  - reports improvement in her symptoms since.  - GI following.    For questions or updates, please contact [place] Please consult [email or web address] for contact info under Cardiology/STEMI.  Signed, Erma Heritage, PA-C 07/17/2017, 9:06 AM Pager: [phone]   Attending note Patient seen and discussed with PA Ahmed Prima, I agree with her documentation above. New onset afibt his admission s/p ERCP. SHe does not require av nodal agents at this time. Start oral anticoagulation eliquis 2.5mg  bid when ok from cardiac standpoint. MR abdomen/pelvis suggests dissection of distal descending aorta not seen on CT, both images  were noncontrasted. Essentially unclear at this time, consider contrast study pending Cr, if not improved could consider TEE. BP's are well controlled.    Carlyle Dolly MD

## 2017-07-17 NOTE — Progress Notes (Signed)
Subjective:  Patient with vague epigastric pain. Tolerating her breakfast. Feels much better.   Objective: Vital signs in last 24 hours: Temp:  [97.5 F (36.4 C)-98.5 F (36.9 C)] 97.5 F (36.4 C) (11/21 0604) Pulse Rate:  [67-98] 67 (11/21 0604) Resp:  [13-22] 16 (11/21 0604) BP: (93-122)/(54-82) 105/59 (11/21 0604) SpO2:  [93 %-100 %] 98 % (11/21 0604) Last BM Date: 07/13/17 General:   Alert,  Well-developed, well-nourished, pleasant and cooperative in NAD Head:  Normocephalic and atraumatic. Eyes:  Sclera clear, no icterus.  Abdomen:  Soft, mild epigastric tenderness and nondistended.  Normal bowel sounds, without guarding, and without rebound.   Extremities:  Without clubbing, deformity or edema. Neurologic:  Alert and  oriented x4;  grossly normal neurologically. Skin:  Intact without significant lesions or rashes. Psych:  Alert and cooperative. Normal mood and affect.  Intake/Output from previous day: 11/20 0701 - 11/21 0700 In: 920 [P.O.:120; I.V.:800] Out: -  Intake/Output this shift: No intake/output data recorded.  Lab Results: CBC Recent Labs    07/14/17 1311 07/15/17 0614 07/17/17 0409  WBC 10.5 9.6 9.0  HGB 13.7 12.5 11.8*  HCT 40.4 37.5 36.2  MCV 92.0 95.2 96.3  PLT 163 164 185   BMET Recent Labs    07/14/17 1311 07/15/17 0614 07/17/17 0409  NA 134* 136 135  K 4.0 3.8 4.4  CL 103 102 104  CO2 22 25 23   GLUCOSE 367* 193* 180*  BUN 37* 40* 35*  CREATININE 1.75* 1.85* 1.62*  CALCIUM 8.6* 8.3* 8.4*   LFTs Recent Labs    07/14/17 1311 07/15/17 0614 07/17/17 0409  BILITOT 2.1* 1.7* 1.5*  ALKPHOS 152* 123 182*  AST 72* 35 51*  ALT 108* 77* 93*  PROT 5.7* 5.5* 5.1*  ALBUMIN 3.3* 3.1* 2.8*   Recent Labs    07/14/17 1311 07/17/17 0409  LIPASE 18 20   PT/INR No results for input(s): LABPROT, INR in the last 72 hours.    Imaging Studies: Ct Abdomen Pelvis Wo Contrast  Result Date: 07/14/2017 CLINICAL DATA:  Epigastric,  periumbilical and right lower quadrant pain for 2 days. EXAM: CT ABDOMEN AND PELVIS WITHOUT CONTRAST TECHNIQUE: Multidetector CT imaging of the abdomen and pelvis was performed following the standard protocol without IV contrast. COMPARISON:  CT abdomen dated 03/30/2005. FINDINGS: Lower chest: No acute findings. Mild focal scarring/atelectasis at the right lung base. Hepatobiliary: No focal liver abnormality is seen. There is marked common bile duct dilatation, measuring up to approximately 2.5 cm. Gallbladder is not seen, presumed cholecystectomy. Pancreas: Fatty infiltration of the pancreas. Otherwise unremarkable. Spleen: Normal in size without focal abnormality. Adrenals/Urinary Tract: Adrenal glands are unremarkable. Parapelvic renal cysts bilaterally. No renal stone or hydronephrosis. No ureteral or bladder calculi identified. Bladder is unremarkable, partially decompressed. Stomach/Bowel: Extensive diverticulosis throughout the colon but no focal inflammatory change to suggest acute diverticulitis. Stomach is unremarkable. No dilated large or small bowel loops. Appendix is not seen but there are no inflammatory changes about the cecum to suggest acute appendicitis. Vascular/Lymphatic: Aortic atherosclerosis. No enlarged lymph nodes seen. Reproductive: Presumed hysterectomy. No adnexal mass or inflammatory change. Other: No free fluid or abscess collection. No free intraperitoneal air. Musculoskeletal: Mild degenerative change of the slightly scoliotic thoracolumbar spine. No acute or suspicious osseous finding. Superficial soft tissues are unremarkable. IMPRESSION: 1. Marked common bile duct dilatation, measuring up to 2.5 cm diameter. No obstructing stone identified. Recommend correlation with liver function tests. Consider MRCP for further characterization. 2. Extensive colonic diverticulosis  without evidence of acute diverticulitis. 3. Aortic atherosclerosis. 4. Remainder of the abdomen and pelvis is  unremarkable, as detailed above. No bowel obstruction or evidence of bowel wall inflammation. No free fluid. No evidence of acute solid organ abnormality. No renal or ureteral calculi. Electronically Signed   By: Franki Cabot M.D.   On: 07/14/2017 15:58   Dg Chest 2 View  Result Date: 06/30/2017 CLINICAL DATA:  Cough congestion and fever EXAM: CHEST  2 VIEW COMPARISON:  07/17/2011 FINDINGS: Streaky opacity at the lingula. Hyperinflation. No pleural effusion. Mild cardiomegaly with aortic atherosclerosis. No pneumothorax. Possible distal resection of right clavicle. IMPRESSION: 1. Streaky opacity at the lingula, may reflect atelectasis scar or infiltrate 2. Mild cardiomegaly Electronically Signed   By: Donavan Foil M.D.   On: 06/30/2017 19:01   Ct Chest Wo Contrast  Result Date: 07/15/2017 CLINICAL DATA:  Followup abnormal MRCP suggesting possible aortic dissection. EXAM: CT CHEST WITHOUT CONTRAST TECHNIQUE: Multidetector CT imaging of the chest was performed following the standard protocol without IV contrast. COMPARISON:  MRCP 07/15/2017 FINDINGS: Cardiovascular: The heart is normal in size for age. No pericardial effusion. Advanced atherosclerotic calcifications involving the tortuous thoracic aorta. No obvious displaced intimal calcifications or other findings to definitely confirm the thoracic aortic dissection but the MR findings are quite convincing. There are three-vessel coronary artery calcifications. The pulmonary arteries are enlarged suggesting pulmonary hypertension. Mediastinum/Nodes: Small scattered mediastinal and hilar lymph nodes but no mass or adenopathy. The esophagus is grossly normal. Lungs/Pleura: Mild emphysematous changes but no acute pulmonary findings. Patchy sub solid nodular lesion in the right lower lobe on image number 85. This measures 13.5 x 10 mm. On the coronal and sagittal images this has more the appearance of a cluster of small nodules can could reflect focal area of  inflammation or atypical infection could. Recommend followup noncontrast chest CT in 3 months to reassess. Left lower lobe scarring changes are noted. Upper Abdomen: No significant upper abdominal findings other than the intra and extrahepatic biliary dilatation seen on the MRCP. Musculoskeletal: No breast masses, supraclavicular or axillary adenopathy. The bony structures are unremarkable. Scoliosis and degenerative changes are noted in the thoracic spine. IMPRESSION: 1. No obvious thoracic aortic dissection on this non contrasted CT scan but the MR findings are quite convincing. 2. Three-vessel coronary artery calcifications. 3. Patchy nodular opacity in the right lower lobe as discussed above. Recommend follow-up noncontrast chest CT in 3 months. 4. No mediastinal or hilar mass or adenopathy. 5. Stable intra and extrahepatic biliary dilatation. Aortic Atherosclerosis (ICD10-I70.0) and Emphysema (ICD10-J43.9). Electronically Signed   By: Marijo Sanes M.D.   On: 07/15/2017 12:41   Mr Abdomen Mrcp Wo Contrast  Result Date: 07/15/2017 CLINICAL DATA:  83 year old with epigastric, periumbilical and right lower quadrant abdominal pain for 2 days. Biliary dilatation on CT. History of diabetes and chronic kidney disease. Mildly elevated liver function studies. EXAM: MRI ABDOMEN WITHOUT CONTRAST  (INCLUDING MRCP) TECHNIQUE: Multiplanar multisequence MR imaging of the abdomen was performed. Heavily T2-weighted images of the biliary and pancreatic ducts were obtained, and three-dimensional MRCP images were rendered by post processing. COMPARISON:  Abdominal CT 07/14/2017 and 03/30/2005. Chest CT 12/10/2004. FINDINGS: Despite efforts by the technologist and patient, mild motion artifact is present on today's exam and could not be eliminated. This reduces exam sensitivity and specificity. Lower chest:  The visualized lower chest appears unremarkable. Hepatobiliary: Allowing for the motion artifact and lack of contrast,  the liver appears unremarkable aside from marked intra  and extrahepatic biliary dilatation as seen on recent CT. The common hepatic duct measures up to 23 mm in diameter. There is a large tubular shaped filling defect within the common bile duct which measures up to 4.3 cm in length on coronal image 81 of series 5 and up to 1.6 cm transverse (image 28 of series 17). This demonstrates heterogeneous areas of decreased T2 signal, especially inferiorly. No definite ampullary mass. Previous cholecystectomy. Pancreas: Atrophied with fatty replacement. No pancreatic ductal dilatation or surrounding inflammation. Spleen: Normal in size without focal abnormality. Adrenals/Urinary Tract: Both adrenal glands appear normal. Both kidneys demonstrate cortical thinning and cyst formation in the sinus fat. No hydronephrosis or suspicious cortical lesion. Stomach/Bowel: No evidence of bowel wall thickening, distention or surrounding inflammatory change.There is diffuse colonic diverticulosis. Vascular/Lymphatic: There are no enlarged abdominal lymph nodes. There is crescentic high T1 and high T2 signal along the right aspect of the distal thoracic aorta suspicious for aortic dissection. On the coronal images, this extends approximately 8.7 cm in length, without definite involvement of the aortic arch. This is incompletely evaluated by this noncontrast examination of the abdomen. Other: No ascites. Musculoskeletal: No acute or significant osseous findings. There are degenerative changes throughout the thoracolumbar spine associated with a mild scoliosis. IMPRESSION: 1. Marked intrahepatic and extrahepatic biliary dilatation with tubular filling defect in the common bile duct, likely a combination of gallstones and sludge. The degree of biliary dilatation suggests an underlying biliary stricture, and ERCP should be considered. No obvious ampullary mass. 2. Suspected dissection of the distal thoracic aorta, age indeterminate. This is  incompletely visualized by the current examination. The patient's renal insufficiency likely precludes performance of contrast-enhanced CTA. Consider further evaluation with noncontrast chest CT or MRA. 3. Bilateral renal cortical thinning.  No hydronephrosis. 4. These results will be called to the ordering clinician or representative by the Radiologist Assistant, and communication documented in the PACS or zVision Dashboard. Electronically Signed   By: Richardean Sale M.D.   On: 07/15/2017 10:40   Mr 3d Recon At Scanner  Result Date: 07/15/2017 CLINICAL DATA:  83 year old with epigastric, periumbilical and right lower quadrant abdominal pain for 2 days. Biliary dilatation on CT. History of diabetes and chronic kidney disease. Mildly elevated liver function studies. EXAM: MRI ABDOMEN WITHOUT CONTRAST  (INCLUDING MRCP) TECHNIQUE: Multiplanar multisequence MR imaging of the abdomen was performed. Heavily T2-weighted images of the biliary and pancreatic ducts were obtained, and three-dimensional MRCP images were rendered by post processing. COMPARISON:  Abdominal CT 07/14/2017 and 03/30/2005. Chest CT 12/10/2004. FINDINGS: Despite efforts by the technologist and patient, mild motion artifact is present on today's exam and could not be eliminated. This reduces exam sensitivity and specificity. Lower chest:  The visualized lower chest appears unremarkable. Hepatobiliary: Allowing for the motion artifact and lack of contrast, the liver appears unremarkable aside from marked intra and extrahepatic biliary dilatation as seen on recent CT. The common hepatic duct measures up to 23 mm in diameter. There is a large tubular shaped filling defect within the common bile duct which measures up to 4.3 cm in length on coronal image 81 of series 5 and up to 1.6 cm transverse (image 28 of series 17). This demonstrates heterogeneous areas of decreased T2 signal, especially inferiorly. No definite ampullary mass. Previous  cholecystectomy. Pancreas: Atrophied with fatty replacement. No pancreatic ductal dilatation or surrounding inflammation. Spleen: Normal in size without focal abnormality. Adrenals/Urinary Tract: Both adrenal glands appear normal. Both kidneys demonstrate cortical thinning and cyst formation  in the sinus fat. No hydronephrosis or suspicious cortical lesion. Stomach/Bowel: No evidence of bowel wall thickening, distention or surrounding inflammatory change.There is diffuse colonic diverticulosis. Vascular/Lymphatic: There are no enlarged abdominal lymph nodes. There is crescentic high T1 and high T2 signal along the right aspect of the distal thoracic aorta suspicious for aortic dissection. On the coronal images, this extends approximately 8.7 cm in length, without definite involvement of the aortic arch. This is incompletely evaluated by this noncontrast examination of the abdomen. Other: No ascites. Musculoskeletal: No acute or significant osseous findings. There are degenerative changes throughout the thoracolumbar spine associated with a mild scoliosis. IMPRESSION: 1. Marked intrahepatic and extrahepatic biliary dilatation with tubular filling defect in the common bile duct, likely a combination of gallstones and sludge. The degree of biliary dilatation suggests an underlying biliary stricture, and ERCP should be considered. No obvious ampullary mass. 2. Suspected dissection of the distal thoracic aorta, age indeterminate. This is incompletely visualized by the current examination. The patient's renal insufficiency likely precludes performance of contrast-enhanced CTA. Consider further evaluation with noncontrast chest CT or MRA. 3. Bilateral renal cortical thinning.  No hydronephrosis. 4. These results will be called to the ordering clinician or representative by the Radiologist Assistant, and communication documented in the PACS or zVision Dashboard. Electronically Signed   By: Richardean Sale M.D.   On:  07/15/2017 10:40   Dg Ercp Biliary & Pancreatic Ducts  Result Date: 07/16/2017 CLINICAL DATA:  Stone removal EXAM: ERCP TECHNIQUE: Multiple spot images obtained with the fluoroscopic device and submitted for interpretation post-procedure. FLUOROSCOPY TIME:  Fluoroscopy Time:  9 minutes and 48 seconds Radiation Exposure Index (if provided by the fluoroscopic device): Number of Acquired Spot Images: 0 COMPARISON:  None. FINDINGS: Several images demonstrate cannulation of the common bile duct and balloon stone retrieval. The common bile duct remains markedly dilated. IMPRESSION: See above. These images were submitted for radiologic interpretation only. Please see the procedural report for the amount of contrast and the fluoroscopy time utilized. Electronically Signed   By: Marybelle Killings M.D.   On: 07/16/2017 13:09  [2 weeks]   Assessment: Very pleasant 83 year old female presenting with right-sided abdominal pain for the past week and found to have new onset elevated LFTs and marked common bile duct dilatation up to 2.5 cm on non-contrast CT. MRCP confirmed marked intra/extrahepatic biliary dilation with filing defect. ERCP performed yesterday showed biliary papillary stenosis, filling defect c/w a stone and sludge, entire biliary tree was dilated. C/p biliary sphincterotomy and balloon extraction, stone removal.   Clinically continues to improve. Her LFTs are overall minimally improved from admission but slight increase since yesterday.   Incidental finding of suspected dissection of distal thoracic aorta, age indeterminate on MRCP. Chest CT without contrast given renal insufficiency showed no obvious thoracic aortic dissection. Will require f/u chest ct in 3 months for RLL patchy nodular opacity.   During procedure yesterday she was in Afib. Cardiology consult pending for today.     Plan: 1. Avoid ASA and NSAIDs for 24 hours after ERCP. Heparin can be restarted 12-24 hours after procedure without  bolus. NO anticoagulation given recent sphincterotomy.  2. Appreciate cardiology input. MRCP convincing for dissection but not apparent on noncontrast chest CT. Would like for cardiology to weigh in on this matter as well.  3. Hopefully will see downward trend in LFTs. Recheck in AM if still hospitalized or first of next week.   Laureen Ochs. Bernarda Caffey [facility] [phone] 11/21/20189:07 AM  LOS: 3 days

## 2017-07-17 NOTE — Addendum Note (Signed)
Addendum  created 07/17/17 0854 by Charmaine Downs, CRNA   Sign clinical note

## 2017-07-18 LAB — MAGNESIUM: Magnesium: 1.6 mg/dL — ABNORMAL LOW (ref 1.7–2.4)

## 2017-07-18 LAB — HEPATIC FUNCTION PANEL
ALBUMIN: 2.9 g/dL — AB (ref 3.5–5.0)
ALK PHOS: 159 U/L — AB (ref 38–126)
ALT: 65 U/L — ABNORMAL HIGH (ref 14–54)
AST: 25 U/L (ref 15–41)
BILIRUBIN TOTAL: 1.2 mg/dL (ref 0.3–1.2)
Bilirubin, Direct: 0.3 mg/dL (ref 0.1–0.5)
Indirect Bilirubin: 0.9 mg/dL (ref 0.3–0.9)
TOTAL PROTEIN: 5.3 g/dL — AB (ref 6.5–8.1)

## 2017-07-18 LAB — GLUCOSE, CAPILLARY: GLUCOSE-CAPILLARY: 138 mg/dL — AB (ref 65–99)

## 2017-07-18 LAB — TSH: TSH: 2.304 u[IU]/mL (ref 0.350–4.500)

## 2017-07-18 NOTE — Discharge Summary (Signed)
Physician Discharge Summary  Patient ID: [name] MRN: [id number] DOB/AGE: 81-Jul-1930 81 y.o. Primary Care Physician:Juliyah Mergen, Percell Miller, MD Admit date: 07/14/2017 Discharge date: 07/18/2017    Discharge Diagnoses:   Principal Problem:   Common bile duct dilatation Active Problems:   Type 2 diabetes mellitus (HCC)   COPD (chronic obstructive pulmonary disease) (HCC)   Glaucoma   Essential hypertension   Elevated LFTs   CKD (chronic kidney disease) stage 3, GFR 30-59 ml/min (HCC)   Choledocholithiasis  Atrial fibrillation Allergies as of 07/18/2017      Reactions   Hydromorphone Hcl Other (See Comments)   Patient goes out of right state of mind.       Medication List    TAKE these medications   acetaminophen 500 MG tablet Commonly known as:  TYLENOL Take 500 mg by mouth every 6 (six) hours as needed for mild pain or moderate pain.   amLODipine 5 MG tablet Commonly known as:  NORVASC Take 5 mg by mouth daily.   brimonidine 0.1 % Soln Commonly known as:  ALPHAGAN P Place 1 drop into both eyes 2 (two) times daily.   busPIRone 5 MG tablet Commonly known as:  BUSPAR Take 5 mg 2 (two) times daily as needed by mouth.   cholecalciferol 1000 units tablet Commonly known as:  VITAMIN D Take 1,000 Units daily by mouth.   dorzolamide 2 % ophthalmic solution Commonly known as:  TRUSOPT Place 1 drop into both eyes 2 (two) times daily.   furosemide 20 MG tablet Commonly known as:  LASIX Take 20 mg daily as needed by mouth for fluid.   glipiZIDE 5 MG tablet Commonly known as:  GLUCOTROL Take 5 mg 2 (two) times daily by mouth.   hydrocortisone cream 1 % Apply 2 (two) times daily topically.   ipratropium-albuterol 0.5-2.5 (3) MG/3ML Soln Commonly known as:  DUONEB Take 3 mLs by nebulization every 4 (four) hours as needed. Shortness of Breath   latanoprost 0.005 % ophthalmic solution Commonly known as:  XALATAN Place 1 drop into both eyes at bedtime.    levofloxacin 500 MG tablet Commonly known as:  LEVAQUIN Take 1 tablet (500 mg total) every other day by mouth.   omeprazole 20 MG capsule Commonly known as:  PRILOSEC Take 20 mg by mouth daily.   oxyCODONE-acetaminophen 5-325 MG tablet Commonly known as:  PERCOCET/ROXICET Take 1 tablet by mouth every 6 (six) hours as needed for severe pain.   pravastatin 40 MG tablet Commonly known as:  PRAVACHOL Take 40 mg by mouth daily.   traMADol 50 MG tablet Commonly known as:  ULTRAM Take 1 tablet (50 mg total) by mouth every 6 (six) hours as needed for moderate pain.       Discharged Condition: Improved    Consults: GI/cardiology  Significant Diagnostic Studies: Ct Abdomen Pelvis Wo Contrast  Result Date: 07/14/2017 CLINICAL DATA:  Epigastric, periumbilical and right lower quadrant pain for 2 days. EXAM: CT ABDOMEN AND PELVIS WITHOUT CONTRAST TECHNIQUE: Multidetector CT imaging of the abdomen and pelvis was performed following the standard protocol without IV contrast. COMPARISON:  CT abdomen dated 03/30/2005. FINDINGS: Lower chest: No acute findings. Mild focal scarring/atelectasis at the right lung base. Hepatobiliary: No focal liver abnormality is seen. There is marked common bile duct dilatation, measuring up to approximately 2.5 cm. Gallbladder is not seen, presumed cholecystectomy. Pancreas: Fatty infiltration of the pancreas. Otherwise unremarkable. Spleen: Normal in size without focal abnormality. Adrenals/Urinary Tract: Adrenal glands are unremarkable. Parapelvic renal  cysts bilaterally. No renal stone or hydronephrosis. No ureteral or bladder calculi identified. Bladder is unremarkable, partially decompressed. Stomach/Bowel: Extensive diverticulosis throughout the colon but no focal inflammatory change to suggest acute diverticulitis. Stomach is unremarkable. No dilated large or small bowel loops. Appendix is not seen but there are no inflammatory changes about the cecum to suggest  acute appendicitis. Vascular/Lymphatic: Aortic atherosclerosis. No enlarged lymph nodes seen. Reproductive: Presumed hysterectomy. No adnexal mass or inflammatory change. Other: No free fluid or abscess collection. No free intraperitoneal air. Musculoskeletal: Mild degenerative change of the slightly scoliotic thoracolumbar spine. No acute or suspicious osseous finding. Superficial soft tissues are unremarkable. IMPRESSION: 1. Marked common bile duct dilatation, measuring up to 2.5 cm diameter. No obstructing stone identified. Recommend correlation with liver function tests. Consider MRCP for further characterization. 2. Extensive colonic diverticulosis without evidence of acute diverticulitis. 3. Aortic atherosclerosis. 4. Remainder of the abdomen and pelvis is unremarkable, as detailed above. No bowel obstruction or evidence of bowel wall inflammation. No free fluid. No evidence of acute solid organ abnormality. No renal or ureteral calculi. Electronically Signed   By: Franki Cabot M.D.   On: 07/14/2017 15:58   Dg Chest 2 View  Result Date: 06/30/2017 CLINICAL DATA:  Cough congestion and fever EXAM: CHEST  2 VIEW COMPARISON:  07/17/2011 FINDINGS: Streaky opacity at the lingula. Hyperinflation. No pleural effusion. Mild cardiomegaly with aortic atherosclerosis. No pneumothorax. Possible distal resection of right clavicle. IMPRESSION: 1. Streaky opacity at the lingula, may reflect atelectasis scar or infiltrate 2. Mild cardiomegaly Electronically Signed   By: Donavan Foil M.D.   On: 06/30/2017 19:01   Ct Chest Wo Contrast  Result Date: 07/15/2017 CLINICAL DATA:  Followup abnormal MRCP suggesting possible aortic dissection. EXAM: CT CHEST WITHOUT CONTRAST TECHNIQUE: Multidetector CT imaging of the chest was performed following the standard protocol without IV contrast. COMPARISON:  MRCP 07/15/2017 FINDINGS: Cardiovascular: The heart is normal in size for age. No pericardial effusion. Advanced  atherosclerotic calcifications involving the tortuous thoracic aorta. No obvious displaced intimal calcifications or other findings to definitely confirm the thoracic aortic dissection but the MR findings are quite convincing. There are three-vessel coronary artery calcifications. The pulmonary arteries are enlarged suggesting pulmonary hypertension. Mediastinum/Nodes: Small scattered mediastinal and hilar lymph nodes but no mass or adenopathy. The esophagus is grossly normal. Lungs/Pleura: Mild emphysematous changes but no acute pulmonary findings. Patchy sub solid nodular lesion in the right lower lobe on image number 85. This measures 13.5 x 10 mm. On the coronal and sagittal images this has more the appearance of a cluster of small nodules can could reflect focal area of inflammation or atypical infection could. Recommend followup noncontrast chest CT in 3 months to reassess. Left lower lobe scarring changes are noted. Upper Abdomen: No significant upper abdominal findings other than the intra and extrahepatic biliary dilatation seen on the MRCP. Musculoskeletal: No breast masses, supraclavicular or axillary adenopathy. The bony structures are unremarkable. Scoliosis and degenerative changes are noted in the thoracic spine. IMPRESSION: 1. No obvious thoracic aortic dissection on this non contrasted CT scan but the MR findings are quite convincing. 2. Three-vessel coronary artery calcifications. 3. Patchy nodular opacity in the right lower lobe as discussed above. Recommend follow-up noncontrast chest CT in 3 months. 4. No mediastinal or hilar mass or adenopathy. 5. Stable intra and extrahepatic biliary dilatation. Aortic Atherosclerosis (ICD10-I70.0) and Emphysema (ICD10-J43.9). Electronically Signed   By: Marijo Sanes M.D.   On: 07/15/2017 12:41   Mr Abdomen Mrcp  Wo Contrast  Result Date: 07/15/2017 CLINICAL DATA:  83 year old with epigastric, periumbilical and right lower quadrant abdominal pain for 2  days. Biliary dilatation on CT. History of diabetes and chronic kidney disease. Mildly elevated liver function studies. EXAM: MRI ABDOMEN WITHOUT CONTRAST  (INCLUDING MRCP) TECHNIQUE: Multiplanar multisequence MR imaging of the abdomen was performed. Heavily T2-weighted images of the biliary and pancreatic ducts were obtained, and three-dimensional MRCP images were rendered by post processing. COMPARISON:  Abdominal CT 07/14/2017 and 03/30/2005. Chest CT 12/10/2004. FINDINGS: Despite efforts by the technologist and patient, mild motion artifact is present on today's exam and could not be eliminated. This reduces exam sensitivity and specificity. Lower chest:  The visualized lower chest appears unremarkable. Hepatobiliary: Allowing for the motion artifact and lack of contrast, the liver appears unremarkable aside from marked intra and extrahepatic biliary dilatation as seen on recent CT. The common hepatic duct measures up to 23 mm in diameter. There is a large tubular shaped filling defect within the common bile duct which measures up to 4.3 cm in length on coronal image 81 of series 5 and up to 1.6 cm transverse (image 28 of series 17). This demonstrates heterogeneous areas of decreased T2 signal, especially inferiorly. No definite ampullary mass. Previous cholecystectomy. Pancreas: Atrophied with fatty replacement. No pancreatic ductal dilatation or surrounding inflammation. Spleen: Normal in size without focal abnormality. Adrenals/Urinary Tract: Both adrenal glands appear normal. Both kidneys demonstrate cortical thinning and cyst formation in the sinus fat. No hydronephrosis or suspicious cortical lesion. Stomach/Bowel: No evidence of bowel wall thickening, distention or surrounding inflammatory change.There is diffuse colonic diverticulosis. Vascular/Lymphatic: There are no enlarged abdominal lymph nodes. There is crescentic high T1 and high T2 signal along the right aspect of the distal thoracic aorta  suspicious for aortic dissection. On the coronal images, this extends approximately 8.7 cm in length, without definite involvement of the aortic arch. This is incompletely evaluated by this noncontrast examination of the abdomen. Other: No ascites. Musculoskeletal: No acute or significant osseous findings. There are degenerative changes throughout the thoracolumbar spine associated with a mild scoliosis. IMPRESSION: 1. Marked intrahepatic and extrahepatic biliary dilatation with tubular filling defect in the common bile duct, likely a combination of gallstones and sludge. The degree of biliary dilatation suggests an underlying biliary stricture, and ERCP should be considered. No obvious ampullary mass. 2. Suspected dissection of the distal thoracic aorta, age indeterminate. This is incompletely visualized by the current examination. The patient's renal insufficiency likely precludes performance of contrast-enhanced CTA. Consider further evaluation with noncontrast chest CT or MRA. 3. Bilateral renal cortical thinning.  No hydronephrosis. 4. These results will be called to the ordering clinician or representative by the Radiologist Assistant, and communication documented in the PACS or zVision Dashboard. Electronically Signed   By: Richardean Sale M.D.   On: 07/15/2017 10:40   Mr 3d Recon At Scanner  Result Date: 07/15/2017 CLINICAL DATA:  83 year old with epigastric, periumbilical and right lower quadrant abdominal pain for 2 days. Biliary dilatation on CT. History of diabetes and chronic kidney disease. Mildly elevated liver function studies. EXAM: MRI ABDOMEN WITHOUT CONTRAST  (INCLUDING MRCP) TECHNIQUE: Multiplanar multisequence MR imaging of the abdomen was performed. Heavily T2-weighted images of the biliary and pancreatic ducts were obtained, and three-dimensional MRCP images were rendered by post processing. COMPARISON:  Abdominal CT 07/14/2017 and 03/30/2005. Chest CT 12/10/2004. FINDINGS: Despite  efforts by the technologist and patient, mild motion artifact is present on today's exam and could not be eliminated. This  reduces exam sensitivity and specificity. Lower chest:  The visualized lower chest appears unremarkable. Hepatobiliary: Allowing for the motion artifact and lack of contrast, the liver appears unremarkable aside from marked intra and extrahepatic biliary dilatation as seen on recent CT. The common hepatic duct measures up to 23 mm in diameter. There is a large tubular shaped filling defect within the common bile duct which measures up to 4.3 cm in length on coronal image 81 of series 5 and up to 1.6 cm transverse (image 28 of series 17). This demonstrates heterogeneous areas of decreased T2 signal, especially inferiorly. No definite ampullary mass. Previous cholecystectomy. Pancreas: Atrophied with fatty replacement. No pancreatic ductal dilatation or surrounding inflammation. Spleen: Normal in size without focal abnormality. Adrenals/Urinary Tract: Both adrenal glands appear normal. Both kidneys demonstrate cortical thinning and cyst formation in the sinus fat. No hydronephrosis or suspicious cortical lesion. Stomach/Bowel: No evidence of bowel wall thickening, distention or surrounding inflammatory change.There is diffuse colonic diverticulosis. Vascular/Lymphatic: There are no enlarged abdominal lymph nodes. There is crescentic high T1 and high T2 signal along the right aspect of the distal thoracic aorta suspicious for aortic dissection. On the coronal images, this extends approximately 8.7 cm in length, without definite involvement of the aortic arch. This is incompletely evaluated by this noncontrast examination of the abdomen. Other: No ascites. Musculoskeletal: No acute or significant osseous findings. There are degenerative changes throughout the thoracolumbar spine associated with a mild scoliosis. IMPRESSION: 1. Marked intrahepatic and extrahepatic biliary dilatation with tubular  filling defect in the common bile duct, likely a combination of gallstones and sludge. The degree of biliary dilatation suggests an underlying biliary stricture, and ERCP should be considered. No obvious ampullary mass. 2. Suspected dissection of the distal thoracic aorta, age indeterminate. This is incompletely visualized by the current examination. The patient's renal insufficiency likely precludes performance of contrast-enhanced CTA. Consider further evaluation with noncontrast chest CT or MRA. 3. Bilateral renal cortical thinning.  No hydronephrosis. 4. These results will be called to the ordering clinician or representative by the Radiologist Assistant, and communication documented in the PACS or zVision Dashboard. Electronically Signed   By: Richardean Sale M.D.   On: 07/15/2017 10:40   Dg Ercp Biliary & Pancreatic Ducts  Result Date: 07/16/2017 CLINICAL DATA:  Stone removal EXAM: ERCP TECHNIQUE: Multiple spot images obtained with the fluoroscopic device and submitted for interpretation post-procedure. FLUOROSCOPY TIME:  Fluoroscopy Time:  9 minutes and 48 seconds Radiation Exposure Index (if provided by the fluoroscopic device): Number of Acquired Spot Images: 0 COMPARISON:  None. FINDINGS: Several images demonstrate cannulation of the common bile duct and balloon stone retrieval. The common bile duct remains markedly dilated. IMPRESSION: See above. These images were submitted for radiologic interpretation only. Please see the procedural report for the amount of contrast and the fluoroscopy time utilized. Electronically Signed   By: Marybelle Killings M.D.   On: 07/16/2017 13:09    Lab Results: Basic Metabolic Panel: Recent Labs    07/17/17 0409 07/18/17 0447  NA 135  --   K 4.4  --   CL 104  --   CO2 23  --   GLUCOSE 180*  --   BUN 35*  --   CREATININE 1.62*  --   CALCIUM 8.4*  --   MG  --  1.6*   Liver Function Tests: Recent Labs    07/17/17 0409 07/18/17 0447  AST 51* 25  ALT 93*  65*  ALKPHOS 182* 159*  BILITOT  1.5* 1.2  PROT 5.1* 5.3*  ALBUMIN 2.8* 2.9*     CBC: Recent Labs    07/17/17 0409  WBC 9.0  NEUTROABS 7.0  HGB 11.8*  HCT 36.2  MCV 96.3  PLT 185    No results found for this or any previous visit (from the past 240 hour(s)).   Hospital Course: This is an 84 year old who is been in the hospital with pneumonia and then developed severe abdominal pain.  She came to the emergency department and was noted to have dilated bile duct.  She underwent ERCP because of a stricture noted on MRCP and had biliary sphincterotomy balloon extraction stone removal and is much better.  However she developed atrial fib during her procedure and has remained in atrial fib since then.  She is been able to tolerate a regular diet her liver function testing has cardiology consultation was obtained and it was felt that she would need to be anticoagulated but did not need any rate controlling agents.  Anticoagulation will have to be held off until 07/23/2017 and she will start Eliquis 2.5 mg twice daily  Discharge Exam: Blood pressure 103/61, pulse 77, temperature 98.4 F (36.9 C), temperature source Oral, resp. rate 18, height 5\' 3"  (1.6 m), weight 79.2 kg (174 lb 9.7 oz), SpO2 95 %. She is awake and alert.  No distress.  Her abdomen is soft.  Disposition: Home she will return to my office.  She has a coupon for a free month of Eliquis and will start that on the 27th.  Discharge Instructions    Diet - low sodium heart healthy   Complete by:  As directed    Increase activity slowly   Complete by:  As directed         Signed: Anias Bartol L   07/18/2017, 8:38 AM

## 2017-07-18 NOTE — Progress Notes (Signed)
Pt discharged home today per Dr. Hawkins. Pt's IV site D/C'd and WDL. Pt's VSS. Pt provided with home medication list, discharge instructions and prescriptions. Verbalized understanding. Pt left floor via WC in stable condition accompanied by NT. 

## 2017-07-18 NOTE — Progress Notes (Signed)
She feels much better and wants to go home.  She was able to tolerate regular diet yesterday.  No abdominal pain.  No other new complaints.  We will plan to discharge home today.  Her abdomen is soft with no tenderness and no masses.  LFTs are better

## 2017-07-24 ENCOUNTER — Encounter: Payer: Self-pay | Admitting: Gastroenterology

## 2017-07-24 ENCOUNTER — Telehealth: Payer: Self-pay | Admitting: Gastroenterology

## 2017-07-24 NOTE — Progress Notes (Signed)
Inpatient labs, HFP noted during hospitalization. Magnesium was not ordered by GI (ordered by cardiology) but inadvertently routed to [place]. See phone note dated 11/28. Will recheck to ensure improved/normal and if not, will provide supplementation with subsequent treatment to be deferred to PCP. Repeat HFP to be done this week as well and follow to normal.

## 2017-07-24 NOTE — Telephone Encounter (Signed)
PATIENT SCHEDULED  °

## 2017-07-24 NOTE — Telephone Encounter (Signed)
Stacey: please have patient return in 4-6 weeks for hospital follow-up.  Doris: can we see if patient has had any recent labs as an outpatient since discharge 11/22? Specifically LFTs and magnesium. If she hasn't, we need to check LFTs and Magnesium this week. (Magnesium was ordered by cardiology during inpatient stay and routed to [place] inadvertently. I just want to make sure we update this and will defer any further treatment to PCP).

## 2017-07-25 ENCOUNTER — Other Ambulatory Visit: Payer: Self-pay

## 2017-07-25 DIAGNOSIS — R7989 Other specified abnormal findings of blood chemistry: Secondary | ICD-10-CM

## 2017-07-25 DIAGNOSIS — R945 Abnormal results of liver function studies: Secondary | ICD-10-CM

## 2017-07-25 DIAGNOSIS — R790 Abnormal level of blood mineral: Secondary | ICD-10-CM

## 2017-07-25 NOTE — Telephone Encounter (Signed)
PT is aware to go to get labs tomorrow at [facility].

## 2017-07-25 NOTE — Telephone Encounter (Signed)
LMOM to call, that we need to order some labs.

## 2017-07-25 NOTE — Telephone Encounter (Signed)
LMOM to call.

## 2017-07-25 NOTE — Telephone Encounter (Signed)
FYI to AB.  

## 2017-07-26 DIAGNOSIS — R790 Abnormal level of blood mineral: Secondary | ICD-10-CM | POA: Diagnosis not present

## 2017-07-26 DIAGNOSIS — R945 Abnormal results of liver function studies: Secondary | ICD-10-CM | POA: Diagnosis not present

## 2017-07-27 LAB — HEPATIC FUNCTION PANEL
ALT: 13 IU/L (ref 0–32)
AST: 22 IU/L (ref 0–40)
Albumin: 3.8 g/dL (ref 3.5–4.7)
Alkaline Phosphatase: 113 IU/L (ref 39–117)
BILIRUBIN, DIRECT: 0.23 mg/dL (ref 0.00–0.40)
Bilirubin Total: 0.9 mg/dL (ref 0.0–1.2)
TOTAL PROTEIN: 6.1 g/dL (ref 6.0–8.5)

## 2017-07-27 LAB — MAGNESIUM: MAGNESIUM: 1.4 mg/dL — AB (ref 1.6–2.3)

## 2017-07-29 NOTE — Progress Notes (Signed)
LFTs are now completely normal. She has had low magnesium on several prior occasions over the past month. Supplementation with magnesium in individuals with renal impairment should be cautious. Please forward Magnesium labs to PCP. Somehow, this was originally drawn by cardiology but routed to [place], and we need to just keep things streamlined and have PCP aware of low magnesium. Please call PCP office and let nursing staff know, and fax over these most recent labs with this note. Thanks!

## 2017-07-30 NOTE — Progress Notes (Signed)
May use Miralax daily to BID. Call if this is not helpful.

## 2017-08-08 DIAGNOSIS — H401133 Primary open-angle glaucoma, bilateral, severe stage: Secondary | ICD-10-CM | POA: Diagnosis not present

## 2017-08-08 DIAGNOSIS — H1131 Conjunctival hemorrhage, right eye: Secondary | ICD-10-CM | POA: Diagnosis not present

## 2017-08-29 ENCOUNTER — Emergency Department (HOSPITAL_COMMUNITY): Payer: Medicare Other

## 2017-08-29 ENCOUNTER — Encounter (HOSPITAL_COMMUNITY): Payer: Self-pay | Admitting: *Deleted

## 2017-08-29 ENCOUNTER — Emergency Department (HOSPITAL_COMMUNITY)
Admission: EM | Admit: 2017-08-29 | Discharge: 2017-08-30 | Disposition: A | Discharge status: Home / Self Care | Payer: Medicare Other | Attending: Emergency Medicine | Admitting: Emergency Medicine

## 2017-08-29 DIAGNOSIS — E1122 Type 2 diabetes mellitus with diabetic chronic kidney disease: Secondary | ICD-10-CM | POA: Diagnosis not present

## 2017-08-29 DIAGNOSIS — J449 Chronic obstructive pulmonary disease, unspecified: Secondary | ICD-10-CM | POA: Insufficient documentation

## 2017-08-29 DIAGNOSIS — R600 Localized edema: Secondary | ICD-10-CM | POA: Diagnosis not present

## 2017-08-29 DIAGNOSIS — M7989 Other specified soft tissue disorders: Secondary | ICD-10-CM | POA: Diagnosis not present

## 2017-08-29 DIAGNOSIS — N183 Chronic kidney disease, stage 3 (moderate): Secondary | ICD-10-CM | POA: Insufficient documentation

## 2017-08-29 DIAGNOSIS — M79644 Pain in right finger(s): Secondary | ICD-10-CM

## 2017-08-29 DIAGNOSIS — M79645 Pain in left finger(s): Secondary | ICD-10-CM | POA: Diagnosis not present

## 2017-08-29 DIAGNOSIS — Z79899 Other long term (current) drug therapy: Secondary | ICD-10-CM | POA: Insufficient documentation

## 2017-08-29 DIAGNOSIS — I129 Hypertensive chronic kidney disease with stage 1 through stage 4 chronic kidney disease, or unspecified chronic kidney disease: Secondary | ICD-10-CM | POA: Diagnosis not present

## 2017-08-29 LAB — CBC WITH DIFFERENTIAL/PLATELET
Basophils Absolute: 0.1 10*3/uL (ref 0.0–0.1)
Basophils Relative: 1 %
EOS ABS: 0.4 10*3/uL (ref 0.0–0.7)
EOS PCT: 5 %
HCT: 37.7 % (ref 36.0–46.0)
Hemoglobin: 12.2 g/dL (ref 12.0–15.0)
LYMPHS ABS: 2.1 10*3/uL (ref 0.7–4.0)
Lymphocytes Relative: 30 %
MCH: 30.2 pg (ref 26.0–34.0)
MCHC: 32.4 g/dL (ref 30.0–36.0)
MCV: 93.3 fL (ref 78.0–100.0)
MONOS PCT: 7 %
Monocytes Absolute: 0.5 10*3/uL (ref 0.1–1.0)
Neutro Abs: 4 10*3/uL (ref 1.7–7.7)
Neutrophils Relative %: 57 %
PLATELETS: 291 10*3/uL (ref 150–400)
RBC: 4.04 MIL/uL (ref 3.87–5.11)
RDW: 15.3 % (ref 11.5–15.5)
WBC: 7.1 10*3/uL (ref 4.0–10.5)

## 2017-08-29 MED ORDER — MORPHINE SULFATE (PF) 2 MG/ML IV SOLN
2.0000 mg | Freq: Once | INTRAVENOUS | Status: AC
  Administered 2017-08-29: 2 mg via INTRAVENOUS
  Filled 2017-08-29: qty 1

## 2017-08-29 MED ORDER — CLINDAMYCIN PHOSPHATE 300 MG/50ML IV SOLN
300.0000 mg | Freq: Once | INTRAVENOUS | Status: AC
  Administered 2017-08-29: 300 mg via INTRAVENOUS
  Filled 2017-08-29: qty 50

## 2017-08-29 NOTE — ED Triage Notes (Signed)
Pt says that she has a cyst in the left hand middle finger. She reports that she started having pain, swelling and redness in the finger that extends to the hand today. No meds PTA. No drainage or fevers. The patient denies any type of injury.

## 2017-08-30 DIAGNOSIS — M79645 Pain in left finger(s): Secondary | ICD-10-CM | POA: Diagnosis not present

## 2017-08-30 LAB — BASIC METABOLIC PANEL
ANION GAP: 11 (ref 5–15)
BUN: 27 mg/dL — ABNORMAL HIGH (ref 6–20)
CHLORIDE: 106 mmol/L (ref 101–111)
CO2: 22 mmol/L (ref 22–32)
CREATININE: 1.36 mg/dL — AB (ref 0.44–1.00)
Calcium: 8.9 mg/dL (ref 8.9–10.3)
GFR calc non Af Amer: 34 mL/min — ABNORMAL LOW (ref >60)
GFR, EST AFRICAN AMERICAN: 40 mL/min — AB (ref >60)
Glucose, Bld: 192 mg/dL — ABNORMAL HIGH (ref 65–99)
Potassium: 4.1 mmol/L (ref 3.5–5.1)
SODIUM: 139 mmol/L (ref 135–145)

## 2017-08-30 MED ORDER — MORPHINE SULFATE (PF) 2 MG/ML IV SOLN
1.0000 mg | Freq: Once | INTRAVENOUS | Status: AC
  Administered 2017-08-30: 1 mg via INTRAVENOUS
  Filled 2017-08-30: qty 1

## 2017-08-30 MED ORDER — HYDROCODONE-ACETAMINOPHEN 5-325 MG PO TABS: ORAL_TABLET | ORAL | 0 refills | Status: DC

## 2017-08-30 MED ORDER — SULFAMETHOXAZOLE-TRIMETHOPRIM 800-160 MG PO TABS: 1.0000 | ORAL_TABLET | Freq: Two times a day (BID) | ORAL | 0 refills | Status: AC

## 2017-08-30 NOTE — ED Provider Notes (Signed)
[facility] EMERGENCY DEPARTMENT Provider Note   CSN: [id number] Arrival date & time: 08/29/17  2029     History   Chief Complaint Chief Complaint  Patient presents with  . Hand Pain    HPI [name] is a 84 y.o. female.  HPI  [name] is a 84 y.o. female who presents to the Emergency Department for a cyst to her left middle finger.  She states that she has known about the cyst on her finger for some time and was advised by her primary provider to follow-up with a hand specialist, but she never did.  She also notes irregularity to the nail of the finger.  She states that she has noticed swelling and redness to the distal end of her finger for 2 days and states the redness has extended down her finger today.  She states the pain is localized to the distal joint of her finger and is associated with palpation and with finger movement.  She denies wrist pain or known injury, drainage or swelling along the cuticle.  She states the swelling has improved after application of ice packs.     Past Medical History:  Diagnosis Date  . Acid reflux   . Anxiety   . CKD (chronic kidney disease)   . COPD (chronic obstructive pulmonary disease) ([place])   . Depressed   . Diabetes mellitus   . Glaucoma   . Hypercholesteremia   . Hypertension   . Legally blind in right eye, as defined in [place]   . Panic attacks   . Type 2 diabetes mellitus [facility])     Patient Active Problem List   Diagnosis Date Noted  . Choledocholithiasis   . Common bile duct dilatation 07/14/2017  . Essential hypertension 07/14/2017  . Elevated LFTs 07/14/2017  . CKD (chronic kidney disease) stage 3, GFR 30-59 ml/min (HCC) 07/14/2017  . CAP (community acquired pneumonia) 06/30/2017  . Hypercholesteremia 06/30/2017  . Glaucoma 06/30/2017  . Anxiety 06/30/2017  . Acid reflux 06/30/2017  . Osteoarthritis 07/21/2011  . Altered mental status 07/21/2011  . COPD (chronic obstructive pulmonary disease) ([place])  07/21/2011  . UTI (lower urinary tract infection) 07/21/2011  . TOTAL HIP FOLLOW-UP 04/14/2008  . HIP PAIN 03/17/2008  . SCIATICA 03/17/2008  . CUBITAL TUNNEL SYNDROME 02/10/2008  . KNEE, ARTHRITIS, DEGEN./OSTEO 02/10/2008  . KNEE PAIN 02/10/2008  . NECK PAIN 12/24/2007  . SHOULDER PAIN 11/17/2007  . RUPTURE ROTATOR CUFF 11/17/2007  . Type 2 diabetes mellitus ([place]) 05/13/2007    Past Surgical History:  Procedure Laterality Date  . APPENDECTOMY    . BALLOON DILATION  07/16/2017   Procedure: BALLOON DILATION;  Surgeon: Danie Binder, MD;  Location: [facility] END[facility];  Service: Endoscopy;;  . cataracts    . CHOLECYSTECTOMY    . ERCP N/A 07/16/2017   Procedure: ENDOSCOPIC RETROGRADE CHOLANGIOPANCREATOGRAPHY (ERCP) BALLOON EXTRACTION SLUDGE/STONE REMOVAL;  Surgeon: Danie Binder, MD;  Location: [facility] ENDO SUITE;  Service: Endoscopy;  Laterality: N/A;  . ESOPHAGOGASTRODUODENOSCOPY  2006   Dr. Gala Romney: normal esophagus s/p empiric dilataton, small hiatal hernia  . HIP SURGERY     rod in leg as well  . SPHINCTEROTOMY  07/16/2017   Procedure: SPHINCTEROTOMY;  Surgeon: Danie Binder, MD;  Location: [facility] END[facility];  Service: Endoscopy;;  . TUBAL LIGATION      OB History    No data available       Home Medications    Prior to Admission medications  Medication Sig Start Date End Date Taking? Authorizing Provider  acetaminophen (TYLENOL) 500 MG tablet Take 500 mg by mouth every 6 (six) hours as needed for mild pain or moderate pain.    [provider]  amLODipine (NORVASC) 5 MG tablet Take 5 mg by mouth daily.      [provider]  brimonidine (ALPHAGAN P) 0.1 % SOLN Place 1 drop into both eyes 2 (two) times daily.      [provider]  busPIRone (BUSPAR) 5 MG tablet Take 5 mg 2 (two) times daily as needed by mouth.    [provider]  cholecalciferol (VITAMIN D) 1000 units tablet Take 1,000 Units daily by mouth.    [provider]    dorzolamide (TRUSOPT) 2 % ophthalmic solution Place 1 drop into both eyes 2 (two) times daily.      [provider]  furosemide (LASIX) 20 MG tablet Take 20 mg daily as needed by mouth for fluid.    [provider]  glipiZIDE (GLUCOTROL) 5 MG tablet Take 5 mg 2 (two) times daily by mouth.     [provider]  HYDROcodone-acetaminophen (NORCO/VICODIN) 5-325 MG tablet Take one tab po q 4 hrs prn pain 08/30/17   Tonesha Tsou, PA-C  hydrocortisone cream 1 % Apply 2 (two) times daily topically. 07/05/17   Sinda Du, MD  ipratropium-albuterol (DUONEB) 0.5-2.5 (3) MG/3ML SOLN Take 3 mLs by nebulization every 4 (four) hours as needed. Shortness of Breath    [provider]  latanoprost (XALATAN) 0.005 % ophthalmic solution Place 1 drop into both eyes at bedtime.      [provider]  levofloxacin (LEVAQUIN) 500 MG tablet Take 1 tablet (500 mg total) every other day by mouth. 07/06/17   Sinda Du, MD  omeprazole (PRILOSEC) 20 MG capsule Take 20 mg by mouth daily.      [provider]  oxyCODONE-acetaminophen (PERCOCET/ROXICET) 5-325 MG per tablet Take 1 tablet by mouth every 6 (six) hours as needed for severe pain. Patient not taking: Reported on 07/14/2017 04/06/15   Rolland Porter, MD  pravastatin (PRAVACHOL) 40 MG tablet Take 40 mg by mouth daily.      [provider]  sulfamethoxazole-trimethoprim (BACTRIM DS,SEPTRA DS) 800-160 MG tablet Take 1 tablet by mouth 2 (two) times daily for 7 days. 08/30/17 09/06/17  Tauriel Scronce, PA-C  traMADol (ULTRAM) 50 MG tablet Take 1 tablet (50 mg total) by mouth every 6 (six) hours as needed for moderate pain. 04/05/15   Dorie Rank, MD    Family History Family History  Problem Relation Age of Onset  . Blindness Father   . Bone cancer Father   . Brain cancer Sister   . Alzheimer's disease Brother   . Colon cancer Neg Hx     Social History Social History   Tobacco Use  . Smoking status:  Never Smoker  . Smokeless tobacco: Never Used  Substance Use Topics  . Alcohol use: No  . Drug use: No     Allergies   Hydromorphone hcl   Review of Systems Review of Systems  Constitutional: Negative for chills and fever.  Musculoskeletal: Positive for arthralgias (left middle finger pain and swelling) and joint swelling.  Skin: Negative for color change (redness of the finger) and wound.  Neurological: Negative for weakness and numbness.  All other systems reviewed and are negative.    Physical Exam Updated Vital Signs BP 133/80 (BP Location: Right Arm)   Pulse 100  Temp 98.8 F (37.1 C) (Oral)   Resp 17   Ht 5\' 3"  (1.6 m)   Wt 79.4 kg (175 lb)   SpO2 96%   BMI 31.00 kg/m   Physical Exam  Constitutional: She is oriented to person, place, and time. She appears well-developed and well-nourished. No distress.  HENT:  Head: Atraumatic.  Mouth/Throat: Oropharynx is clear and moist.  Cardiovascular: Normal rate and intact distal pulses.  Pulmonary/Chest: Effort normal and breath sounds normal. No respiratory distress.  Musculoskeletal: She exhibits edema and tenderness. She exhibits no deformity.  Mild edema of the left middle finger distal to the PIP joint.   ttp of the radial aspect of the DIP joint, no fluctuance.  No open wounds or swelling or fluctuance surrounding the nail.  Nail ridging present and Heberden's nodes present   Neurological: She is alert and oriented to person, place, and time. No sensory deficit.  Skin: Skin is warm. Capillary refill takes less than 2 seconds.  Psychiatric: She has a normal mood and affect.  Nursing note and vitals reviewed.    ED Treatments / Results  Labs (all labs ordered are listed, but only abnormal results are displayed) Labs Reviewed  BASIC METABOLIC PANEL - Abnormal; Notable for the following components:      Result Value   Glucose, Bld 192 (*)    BUN 27 (*)    Creatinine, Ser 1.36 (*)    GFR calc non Af Amer 34  (*)    GFR calc Af Amer 40 (*)    All other components within normal limits  CBC WITH DIFFERENTIAL/PLATELET    EKG  EKG Interpretation None       Radiology Dg Finger Middle Left  Result Date: 08/29/2017 CLINICAL DATA:  Third digit pain and swelling, no known injury, initial encounter EXAM: LEFT MIDDLE FINGER 2+V COMPARISON:  06/09/2010 FINDINGS: Degenerative changes are noted in the distal interphalangeal joint progressed in the interval from the prior exam. Generalized soft tissue swelling is noted. No definitive fracture is seen. IMPRESSION: Progressive degenerative changes in the DIP joint with generalized soft tissue swelling. Electronically Signed   By: Inez Catalina M.D.   On: 08/29/2017 21:34    Procedures Procedures (including critical care time)  Medications Ordered in ED Medications  morphine 2 MG/ML injection 2 mg (2 mg Intravenous Given 08/29/17 2353)  clindamycin (CLEOCIN) IVPB 300 mg (0 mg Intravenous Stopped 08/30/17 0102)  morphine 2 MG/ML injection 1 mg (1 mg Intravenous Given 08/30/17 0030)     Initial Impression / Assessment and Plan / ED Course  I have reviewed the triage vital signs and the nursing notes.  Pertinent labs & imaging results that were available during my care of the patient were reviewed by me and considered in my medical decision making (see chart for details).     Pt with improving edema and erythema of the left middle finger.  NV intact.   No concerning symptoms for paronychia no tenderness of the extensor tendon pain focal at the DIP joint.  Likely inflammatory.  Patient reports history of "cyst" of the finger.  No obvious abscess.  Patient reports pain has improved, erythema appears to be improving after IV antibiotics.  She appears stable for discharge.  Patient's family prefers follow-up with Dr. Fredna Dow, referral information provided.  Return precautions discussed.   Final Clinical Impressions(s) / ED Diagnoses   Final diagnoses:  Pain of  finger of right hand    ED Discharge Orders  Ordered    sulfamethoxazole-trimethoprim (BACTRIM DS,SEPTRA DS) 800-160 MG tablet  2 times daily     08/30/17 0100    HYDROcodone-acetaminophen (NORCO/VICODIN) 5-325 MG tablet     08/30/17 0100       Kem Parkinson, PA-C 08/30/17 0128    Mesner, Corene Cornea, MD 08/30/17 0164

## 2017-08-30 NOTE — Discharge Instructions (Signed)
Elevate your hand when possible.  Call one of the hand doctors listed to arrange a follow-up appt.  Return to ER for any worsening symptoms such as increasing pain, swelling and redness

## 2017-09-02 MED FILL — Hydrocodone-Acetaminophen Tab 5-325 MG: ORAL | Qty: 6 | Status: AC

## 2017-09-03 DIAGNOSIS — M19042 Primary osteoarthritis, left hand: Secondary | ICD-10-CM | POA: Diagnosis not present

## 2017-09-03 DIAGNOSIS — M25842 Other specified joint disorders, left hand: Secondary | ICD-10-CM | POA: Diagnosis not present

## 2017-09-05 ENCOUNTER — Other Ambulatory Visit: Payer: Self-pay | Admitting: Orthopedic Surgery

## 2017-09-06 DIAGNOSIS — N183 Chronic kidney disease, stage 3 (moderate): Secondary | ICD-10-CM | POA: Diagnosis not present

## 2017-09-06 DIAGNOSIS — D509 Iron deficiency anemia, unspecified: Secondary | ICD-10-CM | POA: Diagnosis not present

## 2017-09-06 DIAGNOSIS — R809 Proteinuria, unspecified: Secondary | ICD-10-CM | POA: Diagnosis not present

## 2017-09-06 DIAGNOSIS — E559 Vitamin D deficiency, unspecified: Secondary | ICD-10-CM | POA: Diagnosis not present

## 2017-09-06 DIAGNOSIS — I10 Essential (primary) hypertension: Secondary | ICD-10-CM | POA: Diagnosis not present

## 2017-09-06 DIAGNOSIS — Z79899 Other long term (current) drug therapy: Secondary | ICD-10-CM | POA: Diagnosis not present

## 2017-09-11 DIAGNOSIS — N184 Chronic kidney disease, stage 4 (severe): Secondary | ICD-10-CM | POA: Diagnosis not present

## 2017-09-11 DIAGNOSIS — I10 Essential (primary) hypertension: Secondary | ICD-10-CM | POA: Diagnosis not present

## 2017-09-11 DIAGNOSIS — N179 Acute kidney failure, unspecified: Secondary | ICD-10-CM | POA: Diagnosis not present

## 2017-09-11 DIAGNOSIS — R809 Proteinuria, unspecified: Secondary | ICD-10-CM | POA: Diagnosis not present

## 2017-09-11 DIAGNOSIS — Z79899 Other long term (current) drug therapy: Secondary | ICD-10-CM | POA: Diagnosis not present

## 2017-09-13 ENCOUNTER — Ambulatory Visit: Payer: Medicare Other | Admitting: Gastroenterology

## 2017-09-23 ENCOUNTER — Encounter (HOSPITAL_BASED_OUTPATIENT_CLINIC_OR_DEPARTMENT_OTHER): Payer: Self-pay | Admitting: *Deleted

## 2017-09-23 ENCOUNTER — Other Ambulatory Visit: Payer: Self-pay

## 2017-09-26 ENCOUNTER — Encounter (HOSPITAL_BASED_OUTPATIENT_CLINIC_OR_DEPARTMENT_OTHER)
Admission: RE | Disposition: A | Discharge status: Home / Self Care | Payer: Self-pay | Source: Ambulatory Visit | Attending: Orthopedic Surgery

## 2017-09-26 ENCOUNTER — Encounter (HOSPITAL_BASED_OUTPATIENT_CLINIC_OR_DEPARTMENT_OTHER): Payer: Self-pay | Admitting: Anesthesiology

## 2017-09-26 ENCOUNTER — Ambulatory Visit (HOSPITAL_BASED_OUTPATIENT_CLINIC_OR_DEPARTMENT_OTHER): Payer: Medicare Other | Admitting: Anesthesiology

## 2017-09-26 ENCOUNTER — Other Ambulatory Visit: Payer: Self-pay

## 2017-09-26 ENCOUNTER — Ambulatory Visit (HOSPITAL_BASED_OUTPATIENT_CLINIC_OR_DEPARTMENT_OTHER)
Admission: RE | Admit: 2017-09-26 | Discharge: 2017-09-26 | Disposition: A | Discharge status: Home / Self Care | Payer: Medicare Other | Source: Ambulatory Visit | Attending: Orthopedic Surgery | Admitting: Orthopedic Surgery

## 2017-09-26 DIAGNOSIS — F419 Anxiety disorder, unspecified: Secondary | ICD-10-CM | POA: Insufficient documentation

## 2017-09-26 DIAGNOSIS — L729 Follicular cyst of the skin and subcutaneous tissue, unspecified: Secondary | ICD-10-CM | POA: Diagnosis not present

## 2017-09-26 DIAGNOSIS — I129 Hypertensive chronic kidney disease with stage 1 through stage 4 chronic kidney disease, or unspecified chronic kidney disease: Secondary | ICD-10-CM | POA: Diagnosis not present

## 2017-09-26 DIAGNOSIS — J449 Chronic obstructive pulmonary disease, unspecified: Secondary | ICD-10-CM | POA: Insufficient documentation

## 2017-09-26 DIAGNOSIS — E1122 Type 2 diabetes mellitus with diabetic chronic kidney disease: Secondary | ICD-10-CM | POA: Insufficient documentation

## 2017-09-26 DIAGNOSIS — F329 Major depressive disorder, single episode, unspecified: Secondary | ICD-10-CM | POA: Insufficient documentation

## 2017-09-26 DIAGNOSIS — N183 Chronic kidney disease, stage 3 (moderate): Secondary | ICD-10-CM | POA: Diagnosis not present

## 2017-09-26 DIAGNOSIS — Z7984 Long term (current) use of oral hypoglycemic drugs: Secondary | ICD-10-CM | POA: Insufficient documentation

## 2017-09-26 DIAGNOSIS — M85642 Other cyst of bone, left hand: Secondary | ICD-10-CM | POA: Insufficient documentation

## 2017-09-26 DIAGNOSIS — M67442 Ganglion, left hand: Secondary | ICD-10-CM | POA: Diagnosis not present

## 2017-09-26 DIAGNOSIS — Z79899 Other long term (current) drug therapy: Secondary | ICD-10-CM | POA: Insufficient documentation

## 2017-09-26 DIAGNOSIS — E7800 Pure hypercholesterolemia, unspecified: Secondary | ICD-10-CM | POA: Diagnosis not present

## 2017-09-26 DIAGNOSIS — M151 Heberden's nodes (with arthropathy): Secondary | ICD-10-CM | POA: Diagnosis not present

## 2017-09-26 DIAGNOSIS — I4891 Unspecified atrial fibrillation: Secondary | ICD-10-CM | POA: Insufficient documentation

## 2017-09-26 DIAGNOSIS — K219 Gastro-esophageal reflux disease without esophagitis: Secondary | ICD-10-CM | POA: Diagnosis not present

## 2017-09-26 DIAGNOSIS — N189 Chronic kidney disease, unspecified: Secondary | ICD-10-CM | POA: Diagnosis not present

## 2017-09-26 DIAGNOSIS — M25742 Osteophyte, left hand: Secondary | ICD-10-CM | POA: Diagnosis not present

## 2017-09-26 DIAGNOSIS — H548 Legal blindness, as defined in USA: Secondary | ICD-10-CM | POA: Insufficient documentation

## 2017-09-26 DIAGNOSIS — Z885 Allergy status to narcotic agent status: Secondary | ICD-10-CM | POA: Insufficient documentation

## 2017-09-26 DIAGNOSIS — M19042 Primary osteoarthritis, left hand: Secondary | ICD-10-CM | POA: Diagnosis not present

## 2017-09-26 HISTORY — PX: MASS EXCISION: SHX2000

## 2017-09-26 LAB — POCT I-STAT, CHEM 8
BUN: 25 mg/dL — ABNORMAL HIGH (ref 6–20)
Calcium, Ion: 1.16 mmol/L (ref 1.15–1.40)
Chloride: 107 mmol/L (ref 101–111)
Creatinine, Ser: 1.4 mg/dL — ABNORMAL HIGH (ref 0.44–1.00)
Glucose, Bld: 119 mg/dL — ABNORMAL HIGH (ref 65–99)
HCT: 40 % (ref 36.0–46.0)
Hemoglobin: 13.6 g/dL (ref 12.0–15.0)
Potassium: 4.6 mmol/L (ref 3.5–5.1)
Sodium: 141 mmol/L (ref 135–145)
TCO2: 22 mmol/L (ref 22–32)

## 2017-09-26 LAB — GLUCOSE, CAPILLARY: Glucose-Capillary: 110 mg/dL — ABNORMAL HIGH (ref 65–99)

## 2017-09-26 SURGERY — EXCISION MASS
Anesthesia: Monitor Anesthesia Care | Site: Finger | Laterality: Left

## 2017-09-26 MED ORDER — CEFAZOLIN SODIUM 1 G IJ SOLR
INTRAMUSCULAR | Status: AC
  Filled 2017-09-26: qty 20

## 2017-09-26 MED ORDER — ACETAMINOPHEN 325 MG PO TABS: 325.0000 mg | ORAL_TABLET | ORAL | Status: DC | PRN

## 2017-09-26 MED ORDER — FENTANYL CITRATE (PF) 100 MCG/2ML IJ SOLN: 25.0000 ug | INTRAMUSCULAR | Status: DC | PRN

## 2017-09-26 MED ORDER — FENTANYL CITRATE (PF) 100 MCG/2ML IJ SOLN: 50.0000 ug | INTRAMUSCULAR | Status: DC | PRN

## 2017-09-26 MED ORDER — PROPOFOL 10 MG/ML IV BOLUS
INTRAVENOUS | Status: AC
  Filled 2017-09-26: qty 20

## 2017-09-26 MED ORDER — CEFAZOLIN SODIUM-DEXTROSE 2-4 GM/100ML-% IV SOLN
2.0000 g | INTRAVENOUS | Status: AC
  Administered 2017-09-26: 2 g via INTRAVENOUS

## 2017-09-26 MED ORDER — MIDAZOLAM HCL 2 MG/2ML IJ SOLN: 1.0000 mg | INTRAMUSCULAR | Status: DC | PRN

## 2017-09-26 MED ORDER — ACETAMINOPHEN 160 MG/5ML PO SOLN: 325.0000 mg | ORAL | Status: DC | PRN

## 2017-09-26 MED ORDER — BUPIVACAINE HCL (PF) 0.25 % IJ SOLN
INTRAMUSCULAR | Status: DC | PRN
  Administered 2017-09-26: 10 mL

## 2017-09-26 MED ORDER — HYDROCODONE-ACETAMINOPHEN 5-325 MG PO TABS: ORAL_TABLET | ORAL | 0 refills | Status: DC

## 2017-09-26 MED ORDER — CHLORHEXIDINE GLUCONATE 4 % EX LIQD: 60.0000 mL | Freq: Once | CUTANEOUS | Status: DC

## 2017-09-26 MED ORDER — SODIUM CHLORIDE 0.9 % IJ SOLN
INTRAMUSCULAR | Status: AC
  Filled 2017-09-26: qty 10

## 2017-09-26 MED ORDER — ONDANSETRON HCL 4 MG/2ML IJ SOLN
INTRAMUSCULAR | Status: AC
  Filled 2017-09-26: qty 2

## 2017-09-26 MED ORDER — ONDANSETRON HCL 4 MG/2ML IJ SOLN
INTRAMUSCULAR | Status: DC | PRN
  Administered 2017-09-26: 4 mg via INTRAVENOUS

## 2017-09-26 MED ORDER — SCOPOLAMINE 1 MG/3DAYS TD PT72: 1.0000 | MEDICATED_PATCH | Freq: Once | TRANSDERMAL | Status: DC | PRN

## 2017-09-26 MED ORDER — FENTANYL CITRATE (PF) 100 MCG/2ML IJ SOLN
INTRAMUSCULAR | Status: DC | PRN
  Administered 2017-09-26 (×2): 25 ug via INTRAVENOUS

## 2017-09-26 MED ORDER — LIDOCAINE HCL (PF) 0.5 % IJ SOLN
INTRAMUSCULAR | Status: DC | PRN
  Administered 2017-09-26: 30 mL via INTRAVENOUS

## 2017-09-26 MED ORDER — LACTATED RINGERS IV SOLN
INTRAVENOUS | Status: DC
  Administered 2017-09-26 (×2): via INTRAVENOUS

## 2017-09-26 MED ORDER — FENTANYL CITRATE (PF) 100 MCG/2ML IJ SOLN
INTRAMUSCULAR | Status: AC
  Filled 2017-09-26: qty 2

## 2017-09-26 MED ORDER — OXYCODONE HCL 5 MG PO TABS: 5.0000 mg | ORAL_TABLET | Freq: Once | ORAL | Status: DC | PRN

## 2017-09-26 MED ORDER — OXYCODONE HCL 5 MG/5ML PO SOLN: 5.0000 mg | Freq: Once | ORAL | Status: DC | PRN

## 2017-09-26 SURGICAL SUPPLY — 37 items
BLADE SURG 15 STRL LF DISP TIS (BLADE) ×2 IMPLANT
BLADE SURG 15 STRL SS (BLADE) ×4
BNDG CMPR 9X4 STRL LF SNTH (GAUZE/BANDAGES/DRESSINGS) ×1
BNDG COHESIVE 1X5 TAN STRL LF (GAUZE/BANDAGES/DRESSINGS) ×1 IMPLANT
BNDG ESMARK 4X9 LF (GAUZE/BANDAGES/DRESSINGS) ×1 IMPLANT
CHLORAPREP W/TINT 26ML (MISCELLANEOUS) ×2 IMPLANT
CORD BIPOLAR FORCEPS 12FT (ELECTRODE) ×2 IMPLANT
COVER BACK TABLE 60X90IN (DRAPES) ×2 IMPLANT
COVER MAYO STAND STRL (DRAPES) ×2 IMPLANT
CUFF TOURNIQUET SINGLE 18IN (TOURNIQUET CUFF) ×2 IMPLANT
DRAPE EXTREMITY T 121X128X90 (DRAPE) ×2 IMPLANT
DRAPE SURG 17X23 STRL (DRAPES) ×2 IMPLANT
GAUZE SPONGE 4X4 12PLY STRL (GAUZE/BANDAGES/DRESSINGS) ×2 IMPLANT
GAUZE XEROFORM 1X8 LF (GAUZE/BANDAGES/DRESSINGS) ×2 IMPLANT
GLOVE BIO SURGEON STRL SZ7.5 (GLOVE) ×2 IMPLANT
GLOVE BIOGEL M STRL SZ7.5 (GLOVE) ×1 IMPLANT
GLOVE BIOGEL PI IND STRL 7.0 (GLOVE) IMPLANT
GLOVE BIOGEL PI IND STRL 8 (GLOVE) ×1 IMPLANT
GLOVE BIOGEL PI INDICATOR 7.0 (GLOVE) ×2
GLOVE BIOGEL PI INDICATOR 8 (GLOVE) ×2
GLOVE SURG SS PI 7.0 STRL IVOR (GLOVE) ×1 IMPLANT
GOWN STRL REUS W/ TWL LRG LVL3 (GOWN DISPOSABLE) ×1 IMPLANT
GOWN STRL REUS W/TWL LRG LVL3 (GOWN DISPOSABLE) ×4
GOWN STRL REUS W/TWL XL LVL3 (GOWN DISPOSABLE) ×2 IMPLANT
NDL HYPO 25X1 1.5 SAFETY (NEEDLE) ×1 IMPLANT
NEEDLE HYPO 25X1 1.5 SAFETY (NEEDLE) ×2 IMPLANT
NS IRRIG 1000ML POUR BTL (IV SOLUTION) ×2 IMPLANT
PACK BASIN DAY SURGERY FS (CUSTOM PROCEDURE TRAY) ×2 IMPLANT
PADDING CAST ABS 4INX4YD NS (CAST SUPPLIES) ×1
PADDING CAST ABS COTTON 4X4 ST (CAST SUPPLIES) ×1 IMPLANT
SPLINT FINGER 3.25 911903 (SOFTGOODS) ×1 IMPLANT
STOCKINETTE 4X48 STRL (DRAPES) ×2 IMPLANT
SUT ETHILON 4 0 PS 2 18 (SUTURE) ×2 IMPLANT
SYR BULB 3OZ (MISCELLANEOUS) ×2 IMPLANT
SYR CONTROL 10ML LL (SYRINGE) ×2 IMPLANT
TOWEL OR 17X24 6PK STRL BLUE (TOWEL DISPOSABLE) ×4 IMPLANT
UNDERPAD 30X30 (UNDERPADS AND DIAPERS) ×2 IMPLANT

## 2017-09-26 NOTE — Anesthesia Postprocedure Evaluation (Signed)
Anesthesia Post Note  Patient: [name]  Procedure(s) Performed: EXCISION MUCOID CYST WITH DISTAL INTERPHALANGEAL JOINT ARTHROTOMY OF LEFT MIDDLE FINGER (Left Finger)     Patient location during evaluation: PACU Anesthesia Type: MAC and Bier Block Level of consciousness: awake and alert Pain management: pain level controlled Vital Signs Assessment: post-procedure vital signs reviewed and stable Respiratory status: spontaneous breathing, nonlabored ventilation, respiratory function stable and patient connected to nasal cannula oxygen Cardiovascular status: stable and blood pressure returned to baseline Postop Assessment: no apparent nausea or vomiting Anesthetic complications: no    Last Vitals:  Vitals:   09/26/17 1045 09/26/17 1115  BP: 140/88 132/67  Pulse: 79 94  Resp: 12 16  Temp:  36.4 C  SpO2: 94% 96%    Last Pain:  Vitals:   09/26/17 1115  TempSrc:   PainSc: 5                  Judea Riches

## 2017-09-26 NOTE — Discharge Instructions (Addendum)

## 2017-09-26 NOTE — Brief Op Note (Signed)
09/26/2017  10:33 AM  PATIENT:  [name]  84 y.o. female  PRE-OPERATIVE DIAGNOSIS:  MUCOID CYST OF LEFT MIDDLE FINGER  M67.4, M19.042  POST-OPERATIVE DIAGNOSIS:  MUCOID CYST OF LEFT MIDDLE FINGER  M67.4, M19.042  PROCEDURE:  Procedure(s): EXCISION MUCOID CYST WITH DISTAL INTERPHALANGEAL JOINT ARTHROTOMY OF LEFT MIDDLE FINGER (Left)  SURGEON:  Surgeon(s) and Role:    Leanora Cover, MD - Primary  PHYSICIAN ASSISTANT:   ASSISTANTS: none   ANESTHESIA:   Bier block with sedation  EBL:  3 mL   BLOOD ADMINISTERED:none  DRAINS: none   LOCAL MEDICATIONS USED:  MARCAINE     SPECIMEN:  Source of Specimen:  left long finger  DISPOSITION OF SPECIMEN:  PATHOLOGY  COUNTS:  YES  TOURNIQUET:   Total Tourniquet Time Documented: Forearm (Left) - 28 minutes Total: Forearm (Left) - 28 minutes   DICTATION: .Other Dictation: Dictation Number [id number]  PLAN OF CARE: Discharge to home after PACU  PATIENT DISPOSITION:  PACU - hemodynamically stable.

## 2017-09-26 NOTE — H&P (Signed)
[name] is an 84 y.o. female.   Chief Complaint: left long finger mucoid cyst HPI: 84 yo rhd female present with daughter.  Has noted mass left long finger x 4 months.  Nail deformity present.  She wishes to have excision of mucoid cyst and dip joint debridement.  Allergies:  Allergies  Allergen Reactions  . Hydromorphone Hcl Other (See Comments)    Patient goes out of right state of mind.     Past Medical History:  Diagnosis Date  . Acid reflux   . Anxiety   . CKD (chronic kidney disease)   . COPD (chronic obstructive pulmonary disease) ([place])   . Depressed   . Diabetes mellitus   . Glaucoma   . Hypercholesteremia   . Hypertension   . Legally blind in right eye, as defined in [place]   . Panic attacks   . Type 2 diabetes mellitus ([place])     Past Surgical History:  Procedure Laterality Date  . APPENDECTOMY    . BALLOON DILATION  07/16/2017   Procedure: BALLOON DILATION;  Surgeon: Danie Binder, MD;  Location[facility];  Service: Endoscopy;;  . cataracts    . CHOLECYSTECTOMY    . ERCP N/A 07/16/2017   Procedure: ENDOSCOPIC RETROGRADE CHOLANGIOPANCREATOGRAPHY (ERCP) BALLOON EXTRACTION SLUDGE/STONE REMOVAL;  Surgeon: Danie Binder, MD;  Location: [facility];  Service: Endoscopy;  Laterality: N/A;  . ESOPHAGOGASTRODUODENOSCOPY  2006   Dr. Gala Romney: normal esophagus s/p empiric dilataton, small hiatal hernia  . HIP SURGERY     rod in leg as well  . SPHINCTEROTOMY  07/16/2017   Procedure: SPHINCTEROTOMY;  Surgeon: Danie Binder, MD;  [facility];  Service: Endoscopy;;  . TUBAL LIGATION      Family History: Family History  Problem Relation Age of Onset  . Blindness Father   . Bone cancer Father   . Brain cancer Sister   . Alzheimer's disease Brother   . Colon cancer Neg Hx     Social History:   reports that  has never smoked. she has never used smokeless tobacco. She reports that she does not drink alcohol or use  drugs.  Medications: Medications Prior to Admission  Medication Sig Dispense Refill  . amLODipine (NORVASC) 5 MG tablet Take 5 mg by mouth daily.      . busPIRone (BUSPAR) 5 MG tablet Take 5 mg 2 (two) times daily as needed by mouth.    . cholecalciferol (VITAMIN D) 1000 units tablet Take 1,000 Units daily by mouth.    . Difluprednate (DUREZOL OP) Apply to eye.    . furosemide (LASIX) 20 MG tablet Take 20 mg daily as needed by mouth for fluid.    Marland Kitchen glipiZIDE (GLUCOTROL) 5 MG tablet Take 5 mg 2 (two) times daily by mouth.     . hydrocortisone cream 1 % Apply 2 (two) times daily topically. 30 g 0  . omeprazole (PRILOSEC) 20 MG capsule Take 20 mg by mouth daily.      . pravastatin (PRAVACHOL) 40 MG tablet Take 40 mg by mouth daily.      . traMADol (ULTRAM) 50 MG tablet Take 1 tablet (50 mg total) by mouth every 6 (six) hours as needed for moderate pain. 30 tablet 0  . acetaminophen (TYLENOL) 500 MG tablet Take 500 mg by mouth every 6 (six) hours as needed for mild pain or moderate pain.      No results found for this or any previous visit (  from the past 48 hour(s)).  No results found.   A comprehensive review of systems was negative except for: Hematologic/lymphatic: positive for bleeding and easy bruising  Blood pressure 127/90, pulse 79, temperature 97.6 F (36.4 C), temperature source Oral, resp. rate 18, height 5\' 3"  (1.6 m), weight 82.1 kg (181 lb), SpO2 100 %.  General appearance: alert, cooperative and appears stated age Head: Normocephalic, without obvious abnormality, atraumatic Neck: supple, symmetrical, trachea midline Resp: clear to auscultation bilaterally Cardio: regular rate and rhythm GI: non-tender Extremities: Intact sensation and capillary refill all digits.  +epl/fpl/io.  No wounds.  Pulses: 2+ and symmetric Skin: Skin color, texture, turgor normal. No rashes or lesions Neurologic: Grossly normal Incision/Wound:none  Assessment/Plan Left long finger mucoid  cyst and dip joint arthritis.  Non operative and operative treatment options were discussed with the patient and patient wishes to proceed with operative treatment. Risks, benefits, and alternatives of surgery were discussed and the patient agrees with the plan of care.   [name] R 09/26/2017, 9:33 AM

## 2017-09-26 NOTE — Anesthesia Procedure Notes (Signed)
Anesthesia Regional Block: Bier block (IV Regional)   Pre-Anesthetic Checklist: ,, timeout performed, Correct Patient, Correct Site, Correct Laterality, Correct Procedure, Correct Position, site marked, Risks and benefits discussed,  Surgical consent,  Pre-op evaluation,  At surgeon's request and post-op pain management  Laterality: Left  Prep: chloraprep        Procedures:,,,,,, Esmarch exsanguination,,  Narrative:  Start time: 09/26/2017 10:05 AM End time: 09/26/2017 10:07 AM  Events: blood aspirated,,,,,,,,,,

## 2017-09-26 NOTE — Anesthesia Preprocedure Evaluation (Signed)
Anesthesia Evaluation  Patient identified by MRN, date of birth, ID band Patient awake    Reviewed: Allergy & Precautions, NPO status , Patient's Chart, lab work & pertinent test results  History of Anesthesia Complications Negative for: history of anesthetic complications  Airway Mallampati: II  TM Distance: >3 FB Neck ROM: Full    Dental  (+) Teeth Intact, Dental Advisory Given   Pulmonary pneumonia, COPD,     + decreased breath sounds      Cardiovascular hypertension, Pt. on medications + dysrhythmias Atrial Fibrillation  Rhythm:Irregular Rate:Normal     Neuro/Psych PSYCHIATRIC DISORDERS Anxiety Depression  Neuromuscular disease    GI/Hepatic GERD  Medicated and Controlled,  Endo/Other  diabetes, Type 2, Oral Hypoglycemic Agents  Renal/GU Renal InsufficiencyRenal disease     Musculoskeletal  (+) Arthritis ,   Abdominal   Peds  Hematology negative hematology ROS (+)   Anesthesia Other Findings Patient diagnosed in Nov '18 with afib and remains in afib on visit today. Cardiology saw patient in Nov and recommended out patient f/u to discuss anticoagulation given her annual stroke risk. The patient arrives today with her daughter. After discussion with patient, daughter, and Dr Fredna Dow the decision was made to complete today's procedure and the daughter will arrange immediate f/u with cardiology to avoid further delay in anticoagulants should they be warranted. They understand the risk of stroke with continued delay in care and wish to proceed with today's surgery   Reproductive/Obstetrics                             Anesthesia Physical Anesthesia Plan  ASA: III  Anesthesia Plan: MAC and Bier Block and Bier Block-LIDOCAINE ONLY   Post-op Pain Management:    Induction:   PONV Risk Score and Plan: 2 and Treatment may vary due to age or medical condition  Airway Management Planned: Nasal  Cannula  Additional Equipment:   Intra-op Plan:   Post-operative Plan:   Informed Consent: I have reviewed the patients History and Physical, chart, labs and discussed the procedure including the risks, benefits and alternatives for the proposed anesthesia with the patient or authorized representative who has indicated his/her understanding and acceptance.   Dental advisory given  Plan Discussed with: CRNA and Surgeon  Anesthesia Plan Comments:         Anesthesia Quick Evaluation

## 2017-09-26 NOTE — Transfer of Care (Signed)
Immediate Anesthesia Transfer of Care Note  Patient: [name]  Procedure(s) Performed: EXCISION MUCOID CYST WITH DISTAL INTERPHALANGEAL JOINT ARTHROTOMY OF LEFT MIDDLE FINGER (Left Finger)  Patient Location: PACU  Anesthesia Type:Bier block  Level of Consciousness: awake and patient cooperative  Airway & Oxygen Therapy: Patient Spontanous Breathing and Patient connected to face mask oxygen  Post-op Assessment: Report given to RN and Post -op Vital signs reviewed and stable  Post vital signs: Reviewed and stable  Last Vitals:  Vitals:   09/26/17 0731  BP: 127/90  Pulse: 79  Resp: 18  Temp: 36.4 C  SpO2: 100%    Last Pain:  Vitals:   09/26/17 0731  TempSrc: Oral         Complications: No apparent anesthesia complications

## 2017-09-26 NOTE — Op Note (Signed)
NAMECAILAH, REACH NO.:  [id number]  MEDICAL RECORD NO.:  [id number]  LOCATION:                                 FACILITY:  PHYSICIAN:  Leanora Cover, MD        DATE OF BIRTH:  [birth year]  DATE OF PROCEDURE:  09/26/2017 DATE OF DISCHARGE:                              OPERATIVE REPORT   PREOPERATIVE DIAGNOSIS:  Left long finger mucoid cyst and distal interphalangeal joint arthritis.  POSTOPERATIVE DIAGNOSIS:  Left long finger mucoid cyst and distal interphalangeal joint arthritis.  PROCEDURE:  Right long finger excision of mucoid cyst and debridement of DIP joint including osteophyte from middle and distal phalanges.  SURGEON:  Leanora Cover, MD.  ASSISTANT:  None.  ANESTHESIA:  Bier block with sedation.  IV FLUIDS:  Per anesthesia flow sheet.  ESTIMATED BLOOD LOSS:  Minimal.  COMPLICATIONS:  None.  SPECIMENS:  Left long finger cyst to Pathology.  TOURNIQUET TIME:  28 minutes.  DISPOSITION:  Stable to PACU.  INDICATIONS:  Ms. Baum is an 84 year old female, who has noted a mass in the left long finger for approximately 4 months.  She wished to have this removed and the DIP joint debrided.  Risks, benefits, and alternatives of surgery were discussed including the risk of blood loss; infection; damage to nerves, vessels, tendons, ligaments, bone; failure of surgery; need for additional surgery; complications with wound healing; continued pain; and recurrence of mass.  She voiced understanding of these risks and elected to proceed.  OPERATIVE COURSE:  After being identified preoperatively by myself, the patient and I agreed upon procedure and site of procedure.  Surgical site was marked.  Risks, benefits, and alternatives of the surgery were reviewed and they wished to proceed.  Surgical consent had been signed. She was given IV Ancef as preoperative antibiotic prophylaxis.  She was transported to the operating room, placed on the operating room  table in supine position with left upper extremity on arm board.  Bier block anesthesia was induced by anesthesiologist.  Left upper extremity was prepped and draped in normal sterile orthopedic fashion.  Surgical pause was performed between surgeons, anesthesia, and operating room staff, and all were in agreement as to the patient, procedure, and site of procedure.  Tourniquet at the proximal aspect of the forearm had been inflated for the Bier block.  Incision was made in a hockey-stick shape at the DIP joint of the left long finger.  This was carried into subcutaneous tissues by spreading technique.  The mass was identified. It was removed.  There was a large portion of mass underneath the extensor tendon, which was also sent.  There was an osteophyte on the dorsal aspect of the middle phalanx, which was removed with rongeurs. Large bony prominences of the base of the distal phalanx on both the radial and ulnar sides was also taken down with the rongeurs.  This provided better contour of the finger.  The wound was copiously irrigated with sterile saline and then closed with 4-0 nylon in a horizontal mattress fashion.  A digital block had been performed with 10 mL of 0.25% plain Marcaine to aid in postoperative analgesia.  The wound was dressed with sterile Xeroform, 4 x 4 and wrapped with a Coban dressing lightly.  An AlumaFoam splint was placed and wrapped lightly with Coban dressing.  Tourniquet was deflated at 28 minutes.  Fingertips were pink with brisk capillary refill after deflation of tourniquet. The operative drapes were broken down, the patient was awoken from anesthesia safely.  She was transferred back to the stretcher and taken to PACU in stable condition.  I will see her back in the office in 1 week for postoperative followup.  I gave her prescription for Norco 5/325 one to two p.o. q.6 hours p.r.n. pain, dispensed #20.  She states she has taken this in the past without  problems.     Leanora Cover, MD     KK/MEDQ  D:  09/26/2017  T:  09/26/2017  Job:  [id number]

## 2017-09-26 NOTE — Op Note (Signed)
[id number] 

## 2017-09-30 ENCOUNTER — Encounter (HOSPITAL_BASED_OUTPATIENT_CLINIC_OR_DEPARTMENT_OTHER): Payer: Self-pay | Admitting: Orthopedic Surgery

## 2017-10-01 DIAGNOSIS — N184 Chronic kidney disease, stage 4 (severe): Secondary | ICD-10-CM | POA: Diagnosis not present

## 2017-10-01 DIAGNOSIS — Z79899 Other long term (current) drug therapy: Secondary | ICD-10-CM | POA: Diagnosis not present

## 2017-10-01 DIAGNOSIS — I10 Essential (primary) hypertension: Secondary | ICD-10-CM | POA: Diagnosis not present

## 2017-10-08 NOTE — Progress Notes (Deleted)
Cardiology Office Note    Date:  10/08/2017   ID:  [name], DOB August 21, 1929, MRN [id number]  PCP:  Sinda Du, MD  Cardiologist: No primary care provider on file.    No chief complaint on file.   History of Present Illness:    [name] is a 84 y.o. female HTN, HLD, Type 2 DM, Stage 3 CKD, and COPD who presents to the office today for routine follow-up.   She was recently evaluated by Dr. Harl Bowie in 06/2017 during a hospitalization for abdominal pain at which time she was found to have choledocholithiasis with complete removal performed with biliary sphincterectomy and balloon extraction via ERCP.  During the procedure, she was noted to have gone into atrial fibrillation.  Cardiology was consulted and an echocardiogram was performed which showed a preserved EF of 65-70%, no regional wall motion abnormalities, and severe dilation of the left atrium.  She was not started on AV nodal blocking agents as her heart rate was in the mid 50s-70s and she was noted to have occasional pauses up to 2.14 seconds. It was recommended she be started on Eliquis 2.5 mg twice daily once cleared by GI (cleared to start on 07/23/2017 if clinically indicated). She was noted to have a dissection of the distal thoracic aorta, age indeterminate, but this was not noted on CT. It was recommended to consider a repeat contrast CT as an outpatient if kidney function allows.     Past Medical History:  Diagnosis Date  . Acid reflux   . Anxiety   . CKD (chronic kidney disease)   . COPD (chronic obstructive pulmonary disease) ([place])   . Depressed   . Diabetes mellitus   . Glaucoma   . Hypercholesteremia   . Hypertension   . Legally blind in right eye, as defined in [place]   . Panic attacks   . Type 2 diabetes mellitus ([place])     Past Surgical History:  Procedure Laterality Date  . APPENDECTOMY    . BALLOON DILATION  07/16/2017   Procedure: BALLOON DILATION;  Surgeon: Danie Binder, MD;  [facility];  Service: Endoscopy;;  . cataracts    . CHOLECYSTECTOMY    . ERCP N/A 07/16/2017   Procedure: ENDOSCOPIC RETROGRADE CHOLANGIOPANCREATOGRAPHY (ERCP) BALLOON EXTRACTION SLUDGE/STONE REMOVAL;  Surgeon: Danie Binder, MD;  [facility];  Service: Endoscopy;  Laterality: N/A;  . ESOPHAGOGASTRODUODENOSCOPY  2006   Dr. Gala Romney: normal esophagus s/p empiric dilataton, small hiatal hernia  . HIP SURGERY     rod in leg as well  . MASS EXCISION Left 09/26/2017   Procedure: EXCISION MUCOID CYST WITH DISTAL INTERPHALANGEAL JOINT ARTHROTOMY OF LEFT MIDDLE FINGER;  Surgeon: Leanora Cover, MD;  Location: [facility];  Service: Orthopedics;  Laterality: Left;  . SPHINCTEROTOMY  07/16/2017   Procedure: SPHINCTEROTOMY;  Surgeon: Danie Binder, MD;  [facility];  Service: Endoscopy;;  . TUBAL LIGATION      Current Medications: Outpatient Medications Prior to Visit  Medication Sig Dispense Refill  . acetaminophen (TYLENOL) 500 MG tablet Take 500 mg by mouth every 6 (six) hours as needed for mild pain or moderate pain.    Marland Kitchen amLODipine (NORVASC) 5 MG tablet Take 5 mg by mouth daily.      . busPIRone (BUSPAR) 5 MG tablet Take 5 mg 2 (two) times daily as needed by mouth.    . cholecalciferol (VITAMIN D) 1000 units tablet Take 1,000  Units daily by mouth.    . Difluprednate (DUREZOL OP) Apply to eye.    . furosemide (LASIX) 20 MG tablet Take 20 mg daily as needed by mouth for fluid.    Marland Kitchen glipiZIDE (GLUCOTROL) 5 MG tablet Take 5 mg 2 (two) times daily by mouth.     Marland Kitchen HYDROcodone-acetaminophen (NORCO) 5-325 MG tablet 1-2 tabs po q6 hours prn pain 20 tablet 0  . hydrocortisone cream 1 % Apply 2 (two) times daily topically. 30 g 0  . omeprazole (PRILOSEC) 20 MG capsule Take 20 mg by mouth daily.      . pravastatin (PRAVACHOL) 40 MG tablet Take 40 mg by mouth daily.      . traMADol (ULTRAM) 50 MG tablet Take 1 tablet (50 mg total) by mouth every 6 (six) hours as  needed for moderate pain. 30 tablet 0   No facility-administered medications prior to visit.      Allergies:   Hydromorphone hcl   Social History   Socioeconomic History  . Marital status: Divorced    Spouse name: Not on file  . Number of children: Not on file  . Years of education: Not on file  . Highest education level: Not on file  Social Needs  . Financial resource strain: Not on file  . Food insecurity - worry: Not on file  . Food insecurity - inability: Not on file  . Transportation needs - medical: Not on file  . Transportation needs - non-medical: Not on file  Occupational History  . Not on file  Tobacco Use  . Smoking status: Never Smoker  . Smokeless tobacco: Never Used  Substance and Sexual Activity  . Alcohol use: No  . Drug use: No  . Sexual activity: No  Other Topics Concern  . Not on file  Social History Narrative  . Not on file     Family History:  The patient's ***family history includes Alzheimer's disease in her brother; Blindness in her father; Bone cancer in her father; Brain cancer in her sister.   Review of Systems:   Please see the history of present illness.     General:  No chills, fever, night sweats or weight changes.  Cardiovascular:  No chest pain, dyspnea on exertion, edema, orthopnea, palpitations, paroxysmal nocturnal dyspnea. Dermatological: No rash, lesions/masses Respiratory: No cough, dyspnea Urologic: No hematuria, dysuria Abdominal:   No nausea, vomiting, diarrhea, bright red blood per rectum, melena, or hematemesis Neurologic:  No visual changes, wkns, changes in mental status. All other systems reviewed and are otherwise negative except as noted above.   Physical Exam:    VS:  There were no vitals taken for this visit.   General: Well developed, well nourished,female appearing in no acute distress. Head: Normocephalic, atraumatic, sclera non-icteric, no xanthomas, nares are without discharge.  Neck: No carotid bruits.  JVD not elevated.  Lungs: Respirations regular and unlabored, without wheezes or rales.  Heart: ***Regular rate and rhythm. No S3 or S4.  No murmur, no rubs, or gallops appreciated. Abdomen: Soft, non-tender, non-distended with normoactive bowel sounds. No hepatomegaly. No rebound/guarding. No obvious abdominal masses. Msk:  Strength and tone appear normal for age. No joint deformities or effusions. Extremities: No clubbing or cyanosis. No edema.  Distal pedal pulses are 2+ bilaterally. Neuro: Alert and oriented X 3. Moves all extremities spontaneously. No focal deficits noted. Psych:  Responds to questions appropriately with a normal affect. Skin: No rashes or lesions noted  Wt Readings from Last 3 Encounters:  09/26/17 181 lb (82.1 kg)  08/29/17 175 lb (79.4 kg)  07/14/17 174 lb 9.7 oz (79.2 kg)        Studies/Labs Reviewed:   EKG:  EKG is*** ordered today.  The ekg ordered today demonstrates ***  Recent Labs: 07/18/2017: TSH 2.304 07/26/2017: ALT 13; Magnesium 1.4 08/29/2017: Platelets 291 09/26/2017: BUN 25; Creatinine, Ser 1.40; Hemoglobin 13.6; Potassium 4.6; Sodium 141   Lipid Panel No results found for: CHOL, TRIG, HDL, CHOLHDL, VLDL, LDLCALC, LDLDIRECT  Additional studies/ records that were reviewed today include:   Echocardiogram: 07/16/2017 Study Conclusions  - Left ventricle: The cavity size was normal. Wall thickness was   increased in a pattern of severe LVH. Systolic function was   vigorous. The estimated ejection fraction was in the range of 65%   to 70%. Wall motion was normal; there were no regional wall   motion abnormalities. - Aortic valve: Valve area (VTI): 2.32 cm^2. Valve area (Vmax): 2.2   cm^2. Valve area (Vmean): 2.02 cm^2. - Left atrium: The atrium was severely dilated. - Technically difficult study. Echocontrast was used to enhance   visualization.  Assessment:    No diagnosis found.   Plan:   In order of problems listed  above:  1. ***    Medication Adjustments/Labs and Tests Ordered: Current medicines are reviewed at length with the patient today.  Concerns regarding medicines are outlined above.  Medication changes, Labs and Tests ordered today are listed in the Patient Instructions below. There are no Patient Instructions on file for this visit.   Signed, Erma Heritage, PA-C  10/08/2017 1:10 PM    [facility] [address] [place], [place] [address] Phone: [phone]

## 2017-10-10 ENCOUNTER — Ambulatory Visit: Payer: Medicare Other | Admitting: Student

## 2017-10-14 DIAGNOSIS — I10 Essential (primary) hypertension: Secondary | ICD-10-CM | POA: Diagnosis not present

## 2017-10-14 DIAGNOSIS — N184 Chronic kidney disease, stage 4 (severe): Secondary | ICD-10-CM | POA: Diagnosis not present

## 2017-10-14 DIAGNOSIS — Z79899 Other long term (current) drug therapy: Secondary | ICD-10-CM | POA: Diagnosis not present

## 2017-10-16 DIAGNOSIS — N183 Chronic kidney disease, stage 3 (moderate): Secondary | ICD-10-CM | POA: Diagnosis not present

## 2017-10-16 DIAGNOSIS — E1129 Type 2 diabetes mellitus with other diabetic kidney complication: Secondary | ICD-10-CM | POA: Diagnosis not present

## 2017-10-16 DIAGNOSIS — I10 Essential (primary) hypertension: Secondary | ICD-10-CM | POA: Diagnosis not present

## 2017-10-16 DIAGNOSIS — I509 Heart failure, unspecified: Secondary | ICD-10-CM | POA: Diagnosis not present

## 2017-10-28 ENCOUNTER — Ambulatory Visit: Payer: Medicare Other | Admitting: Gastroenterology

## 2017-10-28 ENCOUNTER — Telehealth: Payer: Self-pay | Admitting: Gastroenterology

## 2017-10-28 ENCOUNTER — Encounter: Payer: Self-pay | Admitting: Gastroenterology

## 2017-10-28 NOTE — Telephone Encounter (Signed)
PATIENT WAS A NO SHOW AND LETTER SENT  °

## 2017-11-06 DIAGNOSIS — M19042 Primary osteoarthritis, left hand: Secondary | ICD-10-CM | POA: Insufficient documentation

## 2017-12-27 ENCOUNTER — Encounter: Payer: Self-pay | Admitting: Gastroenterology

## 2017-12-27 ENCOUNTER — Ambulatory Visit (INDEPENDENT_AMBULATORY_CARE_PROVIDER_SITE_OTHER): Payer: Medicare Other | Admitting: Gastroenterology

## 2017-12-27 ENCOUNTER — Other Ambulatory Visit: Payer: Self-pay

## 2017-12-27 VITALS — BP 134/87 | HR 78 | Temp 97.1°F | Ht 63.0 in | Wt 175.4 lb

## 2017-12-27 DIAGNOSIS — K8050 Calculus of bile duct without cholangitis or cholecystitis without obstruction: Secondary | ICD-10-CM | POA: Diagnosis not present

## 2017-12-27 DIAGNOSIS — I4891 Unspecified atrial fibrillation: Secondary | ICD-10-CM

## 2017-12-27 NOTE — Assessment & Plan Note (Addendum)
84 year old female s/p ERCP in Nov 2018, doing well now. LFTs normalized post-procedure. Completely asymptomatic. Will follow-up prn.  As of note, found to have new onset afib while inpatient. Unfortunately, she inadvertently no-showed her follow-up appt with cardiology. We will refer again. I note she will likely need anticoagulation. This is acceptable from a GI standpoint, especially as her procedure was months ago.

## 2017-12-27 NOTE — Patient Instructions (Signed)
I am glad you are doing well!  Please call us if any further issues.   We are getting you back in to see Dr. Harl Bowie.  It was a pleasure to see you today. I strive to create trusting relationships with patients to provide genuine, compassionate, and quality care. I value your feedback. If you receive a survey regarding your visit,  I greatly appreciate you taking time to fill this out.   Annitta Needs, PhD, ANP-BC [facility]

## 2017-12-27 NOTE — Progress Notes (Signed)
Primary Care Physician:  Sinda Du, MD  Primary GI: Dr. Gala Romney   Chief Complaint  Patient presents with  . HFU    from [place] stay in 06/2017. Doing okay.     HPI:   [name] is a 84 y.o. female presenting today with a history of choledocholithiasis in Nov 2018, hospitalized and undergoing ERCP with   biliary papillary stenosis, filling defect c/w a stone and sludge, entire biliary tree was dilated. biliary sphincterotomy and balloon extraction, stone removal. Follow-up LFTs as outpatient were normal.  She has no abdominal pain, N/V, fever/chills. Had episode with constipation but took OTC agents with good response. Now doing well. Her only concern is that she was found to have new onset afib while inpatient and has had difficulty getting in to see cardiology. I looked back and saw that she actually had an appt Feb 2019 with Bernerd Pho, PA, but she no-showed this. Patient and family member state they were not aware of appt. We will try to facilitate an appt for her. No chest pain, shortness of breath fatigue, denies palpitations.   Past Medical History:  Diagnosis Date  . Acid reflux   . Anxiety   . CKD (chronic kidney disease)   . COPD (chronic obstructive pulmonary disease) ([place])   . Depressed   . Diabetes mellitus   . Glaucoma   . Hypercholesteremia   . Hypertension   . Legally blind in right eye, as defined in [place]   . Panic attacks   . Type 2 diabetes mellitus ([place])     Past Surgical History:  Procedure Laterality Date  . APPENDECTOMY    . BALLOON DILATION  07/16/2017   Procedure: BALLOON DILATION;  Surgeon: Danie Binder, MD;  [facility];  Service: Endoscopy;;  . cataracts    . CHOLECYSTECTOMY    . ERCP N/A 07/16/2017   biliary papillary stenosis, filling defect c/w a stone and sludge, entire biliary tree dilated. completed biliary sphincterotomy and balloon extraction, stone removal  . ESOPHAGOGASTRODUODENOSCOPY  2006   Dr.  Gala Romney: normal esophagus s/p empiric dilataton, small hiatal hernia  . HIP SURGERY     rod in leg as well  . MASS EXCISION Left 09/26/2017   Procedure: EXCISION MUCOID CYST WITH DISTAL INTERPHALANGEAL JOINT ARTHROTOMY OF LEFT MIDDLE FINGER;  Surgeon: Leanora Cover, MD;  Location: [facility];  Service: Orthopedics;  Laterality: Left;  . SPHINCTEROTOMY  07/16/2017   Procedure: SPHINCTEROTOMY;  Surgeon: Danie Binder, MD;  [facility];  Service: Endoscopy;;  . TUBAL LIGATION      Current Outpatient Medications  Medication Sig Dispense Refill  . acetaminophen (TYLENOL) 500 MG tablet Take 500 mg by mouth every 6 (six) hours as needed for mild pain or moderate pain.    Marland Kitchen amLODipine (NORVASC) 5 MG tablet Take 5 mg by mouth daily.      . busPIRone (BUSPAR) 5 MG tablet Take 5 mg 2 (two) times daily as needed by mouth.    . cholecalciferol (VITAMIN D) 1000 units tablet Take 1,000 Units daily by mouth.    . Difluprednate (DUREZOL OP) Apply to eye daily.     . furosemide (LASIX) 20 MG tablet Take 20 mg daily as needed by mouth for fluid.    Marland Kitchen glipiZIDE (GLUCOTROL) 5 MG tablet Take 5 mg 2 (two) times daily by mouth.     Marland Kitchen omeprazole (PRILOSEC) 20 MG capsule Take 20 mg  by mouth daily.      . pravastatin (PRAVACHOL) 40 MG tablet Take 40 mg by mouth daily.      . traMADol (ULTRAM) 50 MG tablet Take 1 tablet (50 mg total) by mouth every 6 (six) hours as needed for moderate pain. 30 tablet 0   No current facility-administered medications for this visit.     Allergies as of 12/27/2017 - Review Complete 12/27/2017  Allergen Reaction Noted  . Hydromorphone hcl Other (See Comments)     Family History  Problem Relation Age of Onset  . Blindness Father   . Bone cancer Father   . Brain cancer Sister   . Alzheimer's disease Brother   . Colon cancer Neg Hx     Social History   Socioeconomic History  . Marital status: Divorced    Spouse name: Not on file  . Number of  children: Not on file  . Years of education: Not on file  . Highest education level: Not on file  Occupational History  . Not on file  Social Needs  . Financial resource strain: Not on file  . Food insecurity:    Worry: Not on file    Inability: Not on file  . Transportation needs:    Medical: Not on file    Non-medical: Not on file  Tobacco Use  . Smoking status: Never Smoker  . Smokeless tobacco: Never Used  Substance and Sexual Activity  . Alcohol use: No  . Drug use: No  . Sexual activity: Never  Lifestyle  . Physical activity:    Days per week: Not on file    Minutes per session: Not on file  . Stress: Not on file  Relationships  . Social connections:    Talks on phone: Not on file    Gets together: Not on file    Attends religious service: Not on file    Active member of club or organization: Not on file    Attends meetings of clubs or organizations: Not on file    Relationship status: Not on file  Other Topics Concern  . Not on file  Social History Narrative  . Not on file    Review of Systems: As mentioned in HPI   Physical Exam: BP 134/87   Pulse 78   Temp (!) 97.1 F (36.2 C) (Oral)   Ht 5\' 3"  (1.6 m)   Wt 175 lb 6.4 oz (79.6 kg)   BMI 31.07 kg/m  General:   Alert and oriented. No distress noted. Pleasant and cooperative.  Head:  Normocephalic and atraumatic. Eyes:  Conjuctiva clear without scleral icterus. Mouth:  Oral mucosa pink and moist.  Cardiac: irregularly irregular, no obvious murmur Abdomen:  +BS, soft, non-tender and non-distended. No rebound or guarding. No HSM or masses noted. Extremities:  Without edema. Neurologic:  Alert and  oriented x4 Psych:  Alert and cooperative. Normal mood and affect.  Lab Results  Component Value Date   ALT 13 07/26/2017   AST 22 07/26/2017   ALKPHOS 113 07/26/2017   BILITOT 0.9 07/26/2017

## 2017-12-30 NOTE — Progress Notes (Signed)
CC'ED TO PCP 

## 2018-01-08 ENCOUNTER — Encounter: Payer: Self-pay | Admitting: Cardiovascular Disease

## 2018-02-10 DIAGNOSIS — N184 Chronic kidney disease, stage 4 (severe): Secondary | ICD-10-CM | POA: Diagnosis not present

## 2018-02-10 DIAGNOSIS — Z79899 Other long term (current) drug therapy: Secondary | ICD-10-CM | POA: Diagnosis not present

## 2018-02-10 DIAGNOSIS — E559 Vitamin D deficiency, unspecified: Secondary | ICD-10-CM | POA: Diagnosis not present

## 2018-02-10 DIAGNOSIS — R809 Proteinuria, unspecified: Secondary | ICD-10-CM | POA: Diagnosis not present

## 2018-02-10 DIAGNOSIS — N183 Chronic kidney disease, stage 3 (moderate): Secondary | ICD-10-CM | POA: Diagnosis not present

## 2018-02-10 DIAGNOSIS — D509 Iron deficiency anemia, unspecified: Secondary | ICD-10-CM | POA: Diagnosis not present

## 2018-02-10 DIAGNOSIS — I10 Essential (primary) hypertension: Secondary | ICD-10-CM | POA: Diagnosis not present

## 2018-02-19 DIAGNOSIS — R809 Proteinuria, unspecified: Secondary | ICD-10-CM | POA: Diagnosis not present

## 2018-02-19 DIAGNOSIS — N184 Chronic kidney disease, stage 4 (severe): Secondary | ICD-10-CM | POA: Diagnosis not present

## 2018-02-19 DIAGNOSIS — I509 Heart failure, unspecified: Secondary | ICD-10-CM | POA: Diagnosis not present

## 2018-02-19 DIAGNOSIS — I10 Essential (primary) hypertension: Secondary | ICD-10-CM | POA: Diagnosis not present

## 2018-04-23 DIAGNOSIS — E1165 Type 2 diabetes mellitus with hyperglycemia: Secondary | ICD-10-CM | POA: Diagnosis not present

## 2018-04-23 DIAGNOSIS — J449 Chronic obstructive pulmonary disease, unspecified: Secondary | ICD-10-CM | POA: Diagnosis not present

## 2018-04-23 DIAGNOSIS — I10 Essential (primary) hypertension: Secondary | ICD-10-CM | POA: Diagnosis not present

## 2018-04-23 DIAGNOSIS — E1121 Type 2 diabetes mellitus with diabetic nephropathy: Secondary | ICD-10-CM | POA: Diagnosis not present

## 2018-04-24 DIAGNOSIS — I10 Essential (primary) hypertension: Secondary | ICD-10-CM | POA: Diagnosis not present

## 2018-04-24 DIAGNOSIS — E1121 Type 2 diabetes mellitus with diabetic nephropathy: Secondary | ICD-10-CM | POA: Diagnosis not present

## 2018-04-24 DIAGNOSIS — E1165 Type 2 diabetes mellitus with hyperglycemia: Secondary | ICD-10-CM | POA: Diagnosis not present

## 2018-04-24 DIAGNOSIS — J449 Chronic obstructive pulmonary disease, unspecified: Secondary | ICD-10-CM | POA: Diagnosis not present

## 2018-05-20 DIAGNOSIS — I10 Essential (primary) hypertension: Secondary | ICD-10-CM | POA: Diagnosis not present

## 2018-05-20 DIAGNOSIS — D509 Iron deficiency anemia, unspecified: Secondary | ICD-10-CM | POA: Diagnosis not present

## 2018-05-20 DIAGNOSIS — E559 Vitamin D deficiency, unspecified: Secondary | ICD-10-CM | POA: Diagnosis not present

## 2018-05-20 DIAGNOSIS — N183 Chronic kidney disease, stage 3 (moderate): Secondary | ICD-10-CM | POA: Diagnosis not present

## 2018-05-20 DIAGNOSIS — Z79899 Other long term (current) drug therapy: Secondary | ICD-10-CM | POA: Diagnosis not present

## 2018-05-20 DIAGNOSIS — R809 Proteinuria, unspecified: Secondary | ICD-10-CM | POA: Diagnosis not present

## 2018-05-28 DIAGNOSIS — R809 Proteinuria, unspecified: Secondary | ICD-10-CM | POA: Diagnosis not present

## 2018-05-28 DIAGNOSIS — E875 Hyperkalemia: Secondary | ICD-10-CM | POA: Diagnosis not present

## 2018-05-28 DIAGNOSIS — N184 Chronic kidney disease, stage 4 (severe): Secondary | ICD-10-CM | POA: Diagnosis not present

## 2018-05-28 DIAGNOSIS — N2581 Secondary hyperparathyroidism of renal origin: Secondary | ICD-10-CM | POA: Diagnosis not present

## 2018-06-18 DIAGNOSIS — Z23 Encounter for immunization: Secondary | ICD-10-CM | POA: Diagnosis not present

## 2018-07-21 DIAGNOSIS — E1121 Type 2 diabetes mellitus with diabetic nephropathy: Secondary | ICD-10-CM | POA: Diagnosis not present

## 2018-07-21 DIAGNOSIS — N390 Urinary tract infection, site not specified: Secondary | ICD-10-CM | POA: Diagnosis not present

## 2018-07-21 DIAGNOSIS — J449 Chronic obstructive pulmonary disease, unspecified: Secondary | ICD-10-CM | POA: Diagnosis not present

## 2018-07-21 DIAGNOSIS — N184 Chronic kidney disease, stage 4 (severe): Secondary | ICD-10-CM | POA: Diagnosis not present

## 2018-07-28 DIAGNOSIS — H401133 Primary open-angle glaucoma, bilateral, severe stage: Secondary | ICD-10-CM | POA: Diagnosis not present

## 2018-07-28 DIAGNOSIS — Z961 Presence of intraocular lens: Secondary | ICD-10-CM | POA: Diagnosis not present

## 2018-08-07 ENCOUNTER — Emergency Department (HOSPITAL_COMMUNITY)
Admission: EM | Admit: 2018-08-07 | Discharge: 2018-08-07 | Disposition: A | Discharge status: Home / Self Care | Payer: Medicare Other | Attending: Emergency Medicine | Admitting: Emergency Medicine

## 2018-08-07 ENCOUNTER — Emergency Department (HOSPITAL_COMMUNITY): Payer: Medicare Other

## 2018-08-07 ENCOUNTER — Other Ambulatory Visit: Payer: Self-pay

## 2018-08-07 DIAGNOSIS — E119 Type 2 diabetes mellitus without complications: Secondary | ICD-10-CM | POA: Diagnosis not present

## 2018-08-07 DIAGNOSIS — Z96641 Presence of right artificial hip joint: Secondary | ICD-10-CM | POA: Insufficient documentation

## 2018-08-07 DIAGNOSIS — J449 Chronic obstructive pulmonary disease, unspecified: Secondary | ICD-10-CM | POA: Diagnosis not present

## 2018-08-07 DIAGNOSIS — I129 Hypertensive chronic kidney disease with stage 1 through stage 4 chronic kidney disease, or unspecified chronic kidney disease: Secondary | ICD-10-CM | POA: Diagnosis not present

## 2018-08-07 DIAGNOSIS — M25551 Pain in right hip: Secondary | ICD-10-CM | POA: Diagnosis not present

## 2018-08-07 DIAGNOSIS — Z471 Aftercare following joint replacement surgery: Secondary | ICD-10-CM | POA: Diagnosis not present

## 2018-08-07 DIAGNOSIS — N189 Chronic kidney disease, unspecified: Secondary | ICD-10-CM | POA: Insufficient documentation

## 2018-08-07 LAB — CBC
HEMATOCRIT: 43 % (ref 36.0–46.0)
Hemoglobin: 13.7 g/dL (ref 12.0–15.0)
MCH: 30.2 pg (ref 26.0–34.0)
MCHC: 31.9 g/dL (ref 30.0–36.0)
MCV: 94.7 fL (ref 80.0–100.0)
Platelets: 234 10*3/uL (ref 150–400)
RBC: 4.54 MIL/uL (ref 3.87–5.11)
RDW: 13.6 % (ref 11.5–15.5)
WBC: 7.5 10*3/uL (ref 4.0–10.5)
nRBC: 0 % (ref 0.0–0.2)

## 2018-08-07 LAB — COMPREHENSIVE METABOLIC PANEL
ALT: 8 U/L (ref 0–44)
AST: 15 U/L (ref 15–41)
Albumin: 4.1 g/dL (ref 3.5–5.0)
Alkaline Phosphatase: 64 U/L (ref 38–126)
Anion gap: 6 (ref 5–15)
BUN: 30 mg/dL — AB (ref 8–23)
CHLORIDE: 107 mmol/L (ref 98–111)
CO2: 25 mmol/L (ref 22–32)
CREATININE: 1.56 mg/dL — AB (ref 0.44–1.00)
Calcium: 9.1 mg/dL (ref 8.9–10.3)
GFR calc Af Amer: 34 mL/min — ABNORMAL LOW (ref >60)
GFR calc non Af Amer: 30 mL/min — ABNORMAL LOW (ref >60)
GLUCOSE: 148 mg/dL — AB (ref 70–99)
POTASSIUM: 4.9 mmol/L (ref 3.5–5.1)
SODIUM: 138 mmol/L (ref 135–145)
TOTAL PROTEIN: 7.2 g/dL (ref 6.5–8.1)
Total Bilirubin: 0.7 mg/dL (ref 0.3–1.2)

## 2018-08-07 LAB — SEDIMENTATION RATE: SED RATE: 5 mm/h (ref 0–22)

## 2018-08-07 MED ORDER — HYDROCODONE-ACETAMINOPHEN 5-325 MG PO TABS
1.0000 | ORAL_TABLET | Freq: Once | ORAL | Status: AC
  Administered 2018-08-07: 1 via ORAL
  Filled 2018-08-07: qty 1

## 2018-08-07 MED ORDER — METHOCARBAMOL 500 MG PO TABS
ORAL_TABLET | ORAL | Status: AC
  Filled 2018-08-07: qty 1

## 2018-08-07 MED ORDER — METHOCARBAMOL 500 MG PO TABS
500.0000 mg | ORAL_TABLET | Freq: Once | ORAL | Status: AC
  Administered 2018-08-07: 500 mg via ORAL

## 2018-08-07 MED ORDER — METHOCARBAMOL 500 MG PO TABS: 500.0000 mg | ORAL_TABLET | Freq: Three times a day (TID) | ORAL | 0 refills | Status: DC | PRN

## 2018-08-07 NOTE — ED Provider Notes (Signed)
[facility] Emergency Department Provider Note MRN:  [id number]  Arrival date & time: 08/07/18     Chief Complaint   Hip Pain   History of Present Illness   [name] is a 84 y.o. year-old female with a history of diabetes, COPD, CKD, hip replacement presenting to the ED with chief complaint of hip pain.  Patient has been having mild right hip discomfort for 2 weeks.  2 nights ago, patient was laying in bed and when she tried to rollover she felt a sudden onset pain in her right hip.  Pain is severe, worse with motion, sharp.  Denies fever, no headache or vision change, no chest pain or shortness of breath, no abdominal pain.  No recent trauma.  Review of Systems  A complete 10 system review of systems was obtained and all systems are negative except as noted in the HPI and PMH.   Patient's Health History    Past Medical History:  Diagnosis Date  . Acid reflux   . Anxiety   . CKD (chronic kidney disease)   . COPD (chronic obstructive pulmonary disease) ([place])   . Depressed   . Diabetes mellitus   . Glaucoma   . Hypercholesteremia   . Hypertension   . Legally blind in right eye, as defined in [place]   . Panic attacks   . Type 2 diabetes mellitus ([place])     Past Surgical History:  Procedure Laterality Date  . APPENDECTOMY    . BALLOON DILATION  07/16/2017   Procedure: BALLOON DILATION;  Surgeon: Danie Binder, MD;  [facility];  Service: Endoscopy;;  . cataracts    . CHOLECYSTECTOMY    . ERCP N/A 07/16/2017   biliary papillary stenosis, filling defect c/w a stone and sludge, entire biliary tree dilated. completed biliary sphincterotomy and balloon extraction, stone removal  . ESOPHAGOGASTRODUODENOSCOPY  2006   Dr. Gala Romney: normal esophagus s/p empiric dilataton, small hiatal hernia  . HIP SURGERY     rod in leg as well  . MASS EXCISION Left 09/26/2017   Procedure: EXCISION MUCOID CYST WITH DISTAL INTERPHALANGEAL JOINT ARTHROTOMY OF LEFT  MIDDLE FINGER;  Surgeon: Leanora Cover, MD;  Location: [facility];  Service: Orthopedics;  Laterality: Left;  . SPHINCTEROTOMY  07/16/2017   Procedure: SPHINCTEROTOMY;  Surgeon: Danie Binder, MD;  [facility];  Service: Endoscopy;;  . TUBAL LIGATION      Family History  Problem Relation Age of Onset  . Blindness Father   . Bone cancer Father   . Brain cancer Sister   . Alzheimer's disease Brother   . Colon cancer Neg Hx     Social History   Socioeconomic History  . Marital status: Divorced    Spouse name: Not on file  . Number of children: Not on file  . Years of education: Not on file  . Highest education level: Not on file  Occupational History  . Not on file  Social Needs  . Financial resource strain: Not on file  . Food insecurity:    Worry: Not on file    Inability: Not on file  . Transportation needs:    Medical: Not on file    Non-medical: Not on file  Tobacco Use  . Smoking status: Never Smoker  . Smokeless tobacco: Never Used  Substance and Sexual Activity  . Alcohol use: No  . Drug use: No  . Sexual activity: Never  Lifestyle  .  Physical activity:    Days per week: Not on file    Minutes per session: Not on file  . Stress: Not on file  Relationships  . Social connections:    Talks on phone: Not on file    Gets together: Not on file    Attends religious service: Not on file    Active member of club or organization: Not on file    Attends meetings of clubs or organizations: Not on file    Relationship status: Not on file  . Intimate partner violence:    Fear of current or ex partner: Not on file    Emotionally abused: Not on file    Physically abused: Not on file    Forced sexual activity: Not on file  Other Topics Concern  . Not on file  Social History Narrative  . Not on file     Physical Exam  Vital Signs and Nursing Notes reviewed Vitals:   08/07/18 1912  BP: 131/83  Pulse: 63  Resp: 18  Temp: 97.8 F (36.6  C)  SpO2: 97%    CONSTITUTIONAL: Chronically ill-appearing, NAD NEURO:  Alert and oriented x 3, no focal deficits EYES:  eyes equal and reactive ENT/NECK:  no LAD, no JVD CARDIO: Regular rate, well-perfused, normal S1 and S2 PULM:  CTAB no wheezing or rhonchi GI/GU:  normal bowel sounds, non-distended, non-tender MSK/SPINE:  No gross deformities, no edema; tenderness palpation to the right hip with decreased range of motion due to pain SKIN:  no rash, atraumatic PSYCH:  Appropriate speech and behavior  Diagnostic and Interventional Summary    EKG Interpretation  Date/Time:    Ventricular Rate:    PR Interval:    QRS Duration:   QT Interval:    QTC Calculation:   R Axis:     Text Interpretation:        Labs Reviewed  COMPREHENSIVE METABOLIC PANEL - Abnormal; Notable for the following components:      Result Value   Glucose, Bld 148 (*)    BUN 30 (*)    Creatinine, Ser 1.56 (*)    GFR calc non Af Amer 30 (*)    GFR calc Af Amer 34 (*)    All other components within normal limits  CBC  SEDIMENTATION RATE  C-REACTIVE PROTEIN    DG Hip Unilat W or Wo Pelvis 2-3 Views Right  Final Result      Medications  HYDROcodone-acetaminophen (NORCO/VICODIN) 5-325 MG per tablet 1 tablet (1 tablet Oral Given 08/07/18 1953)     Procedures Critical Care  ED Course and Medical Decision Making  I have reviewed the triage vital signs and the nursing notes.  Pertinent labs & imaging results that were available during my care of the patient were reviewed by me and considered in my medical decision making (see below for details).  Concern for periprosthetic fracture versus dislocation in this 84 year old female with sudden onset right hip pain while rolling over in bed.  No fever, no skin changes at the hip, seems inconsistent with septic joint.  X-ray pending.  X-ray unremarkable.  Labs reveal no leukocytosis, normal ESR.  Patient is now able to range the joint and walk, although  with moderate discomfort.  Safe for discharge with close follow-up with her orthopedic surgeon.  After the discussed management above, the patient was determined to be safe for discharge.  The patient was in agreement with this plan and all questions regarding their care were answered.  ED return precautions were discussed and the patient will return to the ED with any significant worsening of condition.  Barth Kirks. Sedonia Small, [place] [email or web address]  Final Clinical Impressions(s) / ED Diagnoses     ICD-10-CM   1. Right hip pain M25.551     ED Discharge Orders         Ordered    methocarbamol (ROBAXIN) 500 MG tablet  Every 8 hours PRN     08/07/18 2256             Maudie Flakes, MD 08/07/18 2309

## 2018-08-07 NOTE — ED Triage Notes (Signed)
Pt C/O right hip pain that started on Tuesday. Pt denies any fall or injury to the hip. Pt states the pain is worse when applying pressure or moving the hip.

## 2018-08-07 NOTE — Discharge Instructions (Addendum)
You were evaluated in the Emergency Department and after careful evaluation, we did not find any emergent condition requiring admission or further testing in the hospital.  Your symptoms today seem to be due to muscle strain or spasm of the hip.  Your x-rays did not reveal any broken bones.  Please follow-up with your surgeon for closer evaluation.  Use Tylenol or ibuprofen at home for pain.  If your pain is keeping you from sleeping, you can try the muscle relaxer medication provided.  Please return to the Emergency Department if you experience any worsening of your condition.  We encourage you to follow up with a primary care provider.  Thank you for allowing [place] to be a part of your care.

## 2018-08-08 LAB — C-REACTIVE PROTEIN: CRP: 1.4 mg/dL — AB (ref <1.0)

## 2018-09-02 ENCOUNTER — Ambulatory Visit: Payer: Medicare Other | Admitting: Orthopedic Surgery

## 2018-09-03 DIAGNOSIS — Z79899 Other long term (current) drug therapy: Secondary | ICD-10-CM | POA: Diagnosis not present

## 2018-09-03 DIAGNOSIS — D509 Iron deficiency anemia, unspecified: Secondary | ICD-10-CM | POA: Diagnosis not present

## 2018-09-03 DIAGNOSIS — N183 Chronic kidney disease, stage 3 (moderate): Secondary | ICD-10-CM | POA: Diagnosis not present

## 2018-09-03 DIAGNOSIS — R809 Proteinuria, unspecified: Secondary | ICD-10-CM | POA: Diagnosis not present

## 2018-09-03 DIAGNOSIS — I10 Essential (primary) hypertension: Secondary | ICD-10-CM | POA: Diagnosis not present

## 2018-09-03 DIAGNOSIS — E559 Vitamin D deficiency, unspecified: Secondary | ICD-10-CM | POA: Diagnosis not present

## 2018-09-10 DIAGNOSIS — D649 Anemia, unspecified: Secondary | ICD-10-CM | POA: Diagnosis not present

## 2018-09-10 DIAGNOSIS — I10 Essential (primary) hypertension: Secondary | ICD-10-CM | POA: Diagnosis not present

## 2018-09-10 DIAGNOSIS — R809 Proteinuria, unspecified: Secondary | ICD-10-CM | POA: Diagnosis not present

## 2018-09-10 DIAGNOSIS — N184 Chronic kidney disease, stage 4 (severe): Secondary | ICD-10-CM | POA: Diagnosis not present

## 2018-09-10 DIAGNOSIS — E1129 Type 2 diabetes mellitus with other diabetic kidney complication: Secondary | ICD-10-CM | POA: Diagnosis not present

## 2018-09-10 DIAGNOSIS — I509 Heart failure, unspecified: Secondary | ICD-10-CM | POA: Diagnosis not present

## 2018-09-17 ENCOUNTER — Ambulatory Visit (INDEPENDENT_AMBULATORY_CARE_PROVIDER_SITE_OTHER): Payer: Medicare Other

## 2018-09-17 ENCOUNTER — Encounter: Payer: Self-pay | Admitting: Orthopedic Surgery

## 2018-09-17 ENCOUNTER — Ambulatory Visit (INDEPENDENT_AMBULATORY_CARE_PROVIDER_SITE_OTHER): Payer: Medicare Other | Admitting: Orthopedic Surgery

## 2018-09-17 VITALS — BP 134/89 | HR 94 | Ht 63.0 in | Wt 180.0 lb

## 2018-09-17 DIAGNOSIS — Z96641 Presence of right artificial hip joint: Secondary | ICD-10-CM

## 2018-09-17 DIAGNOSIS — M5410 Radiculopathy, site unspecified: Secondary | ICD-10-CM

## 2018-09-17 NOTE — Progress Notes (Signed)
NEW PATIENT OFFICE VISIT  Chief Complaint  Patient presents with  . Back Pain  . Hip Pain    right    85 yo female s/p r tha 15 yrs ago presents with "right hip pain". Went to ER started on Robaxin, but Renal specialist told her to take it as needed   C/o actual dull aching severe lower back pain radiates to upper thigh x 1 month.    Review of Systems  Constitutional: Negative for chills and fever.  Musculoskeletal: Positive for back pain.  Skin: Negative.   Neurological: Negative for tingling, sensory change, focal weakness and weakness.     Past Medical History:  Diagnosis Date  . Acid reflux   . Anxiety   . CKD (chronic kidney disease)   . COPD (chronic obstructive pulmonary disease) ([place])   . Depressed   . Diabetes mellitus   . Glaucoma   . Hypercholesteremia   . Hypertension   . Legally blind in right eye, as defined in [place]   . Panic attacks   . Type 2 diabetes mellitus ([place])     Past Surgical History:  Procedure Laterality Date  . APPENDECTOMY    . BALLOON DILATION  07/16/2017   Procedure: BALLOON DILATION;  Surgeon: Danie Binder, MD;  [facility];  Service: Endoscopy;;  . cataracts    . CHOLECYSTECTOMY    . ERCP N/A 07/16/2017   biliary papillary stenosis, filling defect c/w a stone and sludge, entire biliary tree dilated. completed biliary sphincterotomy and balloon extraction, stone removal  . ESOPHAGOGASTRODUODENOSCOPY  2006   Dr. Gala Romney: normal esophagus s/p empiric dilataton, small hiatal hernia  . HIP SURGERY     rod in leg as well  . MASS EXCISION Left 09/26/2017   Procedure: EXCISION MUCOID CYST WITH DISTAL INTERPHALANGEAL JOINT ARTHROTOMY OF LEFT MIDDLE FINGER;  Surgeon: Leanora Cover, MD;  Location: [facility];  Service: Orthopedics;  Laterality: Left;  . SPHINCTEROTOMY  07/16/2017   Procedure: SPHINCTEROTOMY;  Surgeon: Danie Binder, MD;  [facility];  Service: Endoscopy;;  . TUBAL LIGATION       Family History  Problem Relation Age of Onset  . Blindness Father   . Bone cancer Father   . Brain cancer Sister   . Alzheimer's disease Brother   . Colon cancer Neg Hx    Social History   Tobacco Use  . Smoking status: Never Smoker  . Smokeless tobacco: Never Used  Substance Use Topics  . Alcohol use: No  . Drug use: No    Allergies  Allergen Reactions  . Hydromorphone Hcl Other (See Comments)    Patient goes out of right state of mind.     Current Meds  Medication Sig  . acetaminophen (TYLENOL) 500 MG tablet Take 500 mg by mouth every 6 (six) hours as needed for mild pain or moderate pain.  Marland Kitchen amLODipine (NORVASC) 5 MG tablet Take 5 mg by mouth daily.    . busPIRone (BUSPAR) 10 MG tablet   . cholecalciferol (VITAMIN D) 1000 units tablet Take 1,000 Units daily by mouth.  . Difluprednate (DUREZOL OP) Apply to eye daily.   . furosemide (LASIX) 20 MG tablet Take 20 mg daily as needed by mouth for fluid.  Marland Kitchen glipiZIDE (GLUCOTROL) 5 MG tablet Take 5 mg 2 (two) times daily by mouth.   . methocarbamol (ROBAXIN) 500 MG tablet Take 1 tablet (500 mg total) by mouth every 8 (eight) hours as needed  for muscle spasms.  Marland Kitchen omeprazole (PRILOSEC) 20 MG capsule Take 20 mg by mouth daily.    . pravastatin (PRAVACHOL) 40 MG tablet Take 40 mg by mouth daily.    . traMADol (ULTRAM) 50 MG tablet Take 1 tablet (50 mg total) by mouth every 6 (six) hours as needed for moderate pain.    BP 134/89   Pulse 94   Ht 5\' 3"  (1.6 m)   Wt 180 lb (81.6 kg)   BMI 31.89 kg/m   Physical Exam Vitals signs and nursing note reviewed. Exam conducted with a chaperone present.  Constitutional:      General: She is not in acute distress.    Appearance: Normal appearance. She is not ill-appearing.  Neurological:     Mental Status: She is alert and oriented to person, place, and time.     Gait: Gait abnormal.     Comments: Requires a walker   Psychiatric:        Mood and Affect: Mood normal.         Behavior: Behavior normal.        Thought Content: Thought content normal.        Judgment: Judgment normal.     Right Hip Exam  Right hip exam is normal.   Tenderness  The patient is experiencing tenderness in the greater trochanter.  Range of Motion  The patient has normal right hip ROM.  Muscle Strength  The patient has normal right hip strength.  Tests  FABER: negative  Other  Erythema: absent Sensation: normal Pulse: present  Comments:  HIP STABILITY NORMAL    Left Hip Exam  Left hip exam is normal.  Tenderness  The patient is experiencing no tenderness.   Range of Motion  The patient has normal left hip ROM.  Muscle Strength  The patient has normal left hip strength.   Tests  FABER: negative  Other  Erythema: absent Sensation: normal Pulse: present  Comments:  HIP STABILITY NORMAL    Back Exam   Tenderness  The patient is experiencing tenderness in the lumbar.  Range of Motion  Extension: abnormal  Flexion: abnormal   Muscle Strength  Right Quadriceps:  5/5  Left Quadriceps:  5/5   Reflexes  Patellar: normal Babinski's sign: normal         MEDICAL DECISION SECTION  Xrays were done at [facility]-hip and pelvis: I see no abnormality seen in the hip prosthesis.  L spine:   No diagnosis found.  PLAN: (Rx., injectx, surgery, frx, mri/ct) PT Tramadol Robaxin as needed   No orders of the defined types were placed in this encounter.   Arther Abbott, MD  09/17/2018 8:54 AM

## 2018-09-17 NOTE — Patient Instructions (Addendum)
[facility] wil call you to arrange appointment   Take robaxin and tramadol for pain  Use a heating pad as needed

## 2018-09-17 NOTE — Addendum Note (Signed)
Addended byCandice Camp on: 09/17/2018 09:27 AM   Modules accepted: Orders

## 2018-10-02 ENCOUNTER — Telehealth (HOSPITAL_COMMUNITY): Payer: Self-pay | Admitting: Physical Therapy

## 2018-10-02 ENCOUNTER — Ambulatory Visit (HOSPITAL_COMMUNITY): Payer: Medicare Other | Admitting: Physical Therapy

## 2018-10-02 NOTE — Telephone Encounter (Signed)
pt's dtr called to cancel the appt due to the weather.

## 2018-10-08 ENCOUNTER — Other Ambulatory Visit: Payer: Self-pay

## 2018-10-08 ENCOUNTER — Ambulatory Visit (HOSPITAL_COMMUNITY): Payer: Medicare Other | Attending: Orthopedic Surgery | Admitting: Physical Therapy

## 2018-10-08 ENCOUNTER — Encounter (HOSPITAL_COMMUNITY): Payer: Self-pay | Admitting: Physical Therapy

## 2018-10-08 DIAGNOSIS — M545 Low back pain, unspecified: Secondary | ICD-10-CM

## 2018-10-08 DIAGNOSIS — R29898 Other symptoms and signs involving the musculoskeletal system: Secondary | ICD-10-CM | POA: Diagnosis not present

## 2018-10-08 DIAGNOSIS — G8929 Other chronic pain: Secondary | ICD-10-CM

## 2018-10-08 NOTE — Therapy (Signed)
[facility] [place], [place], [address] Phone: [phone]   Fax:  [phone]  Physical Therapy Evaluation  Patient Details  Name: [name] MRN: [id number] Date of Birth: Jul 29, 1929 Referring Provider (PT): Carole Civil, MD   Encounter Date: 10/08/2018  PT End of Session - 10/08/18 1541    Visit Number  1    Number of Visits  8    Date for PT Re-Evaluation  11/05/18    Authorization Type  Medicare primary; Medicaid secondary    Authorization Time Period  10/08/18 - 11/05/18    Authorization - Visit Number  1    Authorization - Number of Visits  10    PT Start Time  7026    PT Stop Time  1345    PT Time Calculation (min)  40 min    Activity Tolerance  Patient tolerated treatment well    Behavior During Therapy  [facility] for tasks assessed/performed       Past Medical History:  Diagnosis Date  . Acid reflux   . Anxiety   . CKD (chronic kidney disease)   . COPD (chronic obstructive pulmonary disease) ([place])   . Depressed   . Diabetes mellitus   . Glaucoma   . Hypercholesteremia   . Hypertension   . Legally blind in right eye, as defined in [place]   . Panic attacks   . Type 2 diabetes mellitus ([place])     Past Surgical History:  Procedure Laterality Date  . APPENDECTOMY    . BALLOON DILATION  07/16/2017   Procedure: BALLOON DILATION;  Surgeon: Danie Binder, MD;  [facility];  Service: Endoscopy;;  . cataracts    . CHOLECYSTECTOMY    . ERCP N/A 07/16/2017   biliary papillary stenosis, filling defect c/w a stone and sludge, entire biliary tree dilated. completed biliary sphincterotomy and balloon extraction, stone removal  . ESOPHAGOGASTRODUODENOSCOPY  2006   Dr. Gala Romney: normal esophagus s/p empiric dilataton, small hiatal hernia  . HIP SURGERY     rod in leg as well  . MASS EXCISION Left 09/26/2017   Procedure: EXCISION MUCOID CYST WITH DISTAL INTERPHALANGEAL JOINT ARTHROTOMY OF LEFT MIDDLE  FINGER;  Surgeon: Leanora Cover, MD;  Location: [facility];  Service: Orthopedics;  Laterality: Left;  . SPHINCTEROTOMY  07/16/2017   Procedure: SPHINCTEROTOMY;  Surgeon: Danie Binder, MD;  [facility];  Service: Endoscopy;;  . TUBAL LIGATION      There were no vitals filed for this visit.   Subjective Assessment - 10/08/18 1256    Subjective  Patient reported that her back and right leg have been hurting for about 3 months now. Patient stated occasionally she has numbness down to her foot. Patient denied any changes in her bowel and bladder function. Patient stated the physician told her the pain is coming from her back. Patient reported that she has been using the walker since 2006 following her right total hip replacement because her balance is decreased. Patient stated that the worst her back pain has been over the last week is an 8/10. She stated she has trouble seeing and cannot see out of her right eye and can only see partially out of the left eye.     Pertinent History  Right total hip arthroplasty 2006    Limitations  Standing;Walking;Sitting;House hold activities    How long can you sit comfortably?  5-6 minutes    How  long can you stand comfortably?  5 minutes    How long can you walk comfortably?  5 minutes    Diagnostic tests  From chart review: X-ray lumbar scoliosis DDD facet arthritis, Osteopenia spondylosis lumbar osteopenia; X-ray right hip: S/P right THA    Patient Stated Goals  Decreased pain    Currently in Pain?  Yes    Pain Score  5     Pain Location  Back    Pain Orientation  Right;Lower    Pain Descriptors / Indicators  Aching    Pain Type  Chronic pain    Pain Radiating Towards  Radiates down the right leg    Pain Onset  More than a month ago    Pain Frequency  Intermittent    Aggravating Factors   Sitting a long time    Pain Relieving Factors  Moving    Effect of Pain on Daily Activities  Moderately affects         [facility] PT  Assessment - 10/08/18 0001      Assessment   Medical Diagnosis  Back pain with right-sided radiculopathy; H/O total hip arthroplasty, right    Referring Provider (PT)  Carole Civil, MD    Onset Date/Surgical Date  --   3 months   Next MD Visit  --   Unknown   Prior Therapy  Yes, after hip replacement      Precautions   Precautions  None      Restrictions   Weight Bearing Restrictions  No      Balance Screen   Has the patient fallen in the past 6 months  No    Has the patient had a decrease in activity level because of a fear of falling?   Yes    Is the patient reluctant to leave their home because of a fear of falling?   No      Home Environment   Living Environment  Private residence    Living Arrangements  Other relatives    Type of Home  Mobile home    Home Access  Ramped entrance    Home Layout  One level    [place] - 2 wheels      Prior Function   Level of Independence  Independent with household mobility with device;Independent with basic ADLs    Vocation  Retired      Charity fundraiser Status  Within Functional Limits for tasks assessed      Observation/Other Assessments   Focus on Therapeutic Outcomes (FOTO)   46% limited      ROM / Strength   AROM / PROM / Strength  Strength;AROM      AROM   AROM Assessment Site  Lumbar    Lumbar Flexion  75% limited    Lumbar Extension  75% limited    Lumbar - Right Side Bend  75% limited    Lumbar - Left Side Bend  75% limited    Lumbar - Right Rotation  75% limited    Lumbar - Left Rotation  75% limited      Strength   Strength Assessment Site  Hip;Knee;Ankle    Right/Left Hip  Right;Left    Right Hip Flexion  3+/5    Right Hip Extension  3+/5    Right Hip ABduction  3+/5    Left Hip Flexion  3+/5    Left Hip Extension  3+/5    Left Hip ABduction  3+/5    Right/Left Knee  Right;Left    Right Knee Flexion  4-/5    Right Knee Extension  4-/5    Left Knee Flexion  4-/5    Left  Knee Extension  4-/5    Right/Left Ankle  Right;Left    Right Ankle Dorsiflexion  4+/5    Left Ankle Dorsiflexion  4+/5      Flexibility   Soft Tissue Assessment /Muscle Length  yes    Hamstrings  50% limited      Palpation   Palpation comment  Patient seated, therapist palpating down patient's spine: Patient with reported tenderness around L4-5 and into right lateral gluteals and lumbar paraspinals. Noted muscular restrictions in right gluteals.       Special Tests    Special Tests  Lumbar    Lumbar Tests  Slump Test      Slump test   Comment  Negative bilaterally      Balance   Balance Assessed  Yes      Static Standing Balance   Static Standing - Balance Support  No upper extremity supported    Static Standing Balance -  Activities   Single Leg Stance - Right Leg;Single Leg Stance - Left Leg    Static Standing - Comment/# of Minutes  0 seconds bilaterally                Objective measurements completed on examination: See above findings.      [facility] Adult PT Treatment/Exercise - 10/08/18 0001      Exercises   Exercises  Knee/Hip      Knee/Hip Exercises: Stretches   Passive Hamstring Stretch  Right;1 rep;10 seconds    Passive Hamstring Stretch Limitations  Demonstrated understanding for HEP             PT Education - 10/08/18 1541    Education Details  Educated patient on examination findings, plan of care, and HEP.     Person(s) Educated  Patient    Methods  Explanation;Handout    Comprehension  Verbalized understanding       PT Short Term Goals - 10/08/18 1545      PT SHORT TERM GOAL #1   Title  Patient will report understanding and regular compliance with HEP in order to improve strength, balance, flexibility and lumbar mobility.     Time  2    Period  Weeks    Status  New    Target Date  10/22/18      PT SHORT TERM GOAL #2   Title  Patient will report ability to stand for at least 10 minutes in order to do dishes without having to sit.      Time  2    Period  Weeks    Status  New    Target Date  10/22/18        PT Long Term Goals - 10/08/18 1548      PT LONG TERM GOAL #1   Title  Patient will report that her back pain has not exceeded a 3/10 over the course of a 1 week period indicating improved tolerance to daily activities.     Time  4    Period  Weeks    Status  New    Target Date  11/05/18      PT LONG TERM GOAL #2   Title  Patient will demonstrate improvement of at least 1/2 MMT strength grade in all deficient muscle groups in  order to improve ability to ambulate and perform other functional activities.     Time  4    Period  Weeks    Status  New    Target Date  11/05/18      PT LONG TERM GOAL #3   Title  Patient will demonstrate an improvement of at least 10% on FOTO indicating improved percieved functional mobility.     Time  4    Period  Weeks    Status  New    Target Date  11/05/18             Plan - 10/08/18 1556    Clinical Impression Statement  Patient is an 85 year old female who presented to outpatient physical therapy with primary complaints of back pain which radiates down her right leg of at least 3 months duration. Upon examination noted decreased lower extremity strength and decreased lumbar ROM. In addition, patient demonstrated decreased balance and stability with single limb stance. Noted with palpation muscular restrictions in the right hip as well as tenderness to palpation in the lumbar spine. Patient would benefit from skilled physical therapy in order to address the abovementioned deficits and improve her functional mobility.     History and Personal Factors relevant to plan of care:  COPD, HTN, Osteoporosis, Poor vision    Clinical Presentation  Stable    Clinical Presentation due to:  FOTO, MMT, clinical judgement    Clinical Decision Making  Moderate    Rehab Potential  Fair    Clinical Impairments Affecting Rehab Potential  Positive: Motivated; Negative: comorbidities     PT Frequency  2x / week    PT Duration  4 weeks    PT Treatment/Interventions  ADLs/Self Care Home Management;DME Instruction;Gait training;Stair training;Functional mobility training;Therapeutic activities;Therapeutic exercise;Balance training;Neuromuscular re-education;Patient/family education;Orthotic Fit/Training;Manual techniques;Passive range of motion;Dry needling;Energy conservation;Taping    PT Next Visit Plan  Review eval, goals, and HEP. Focus on lumbar ROM and core strengthening and progress to functional lower extremity strengthening. Careful with manual limited or no joint mobs due to osteoporosis. STM to right gluteals   PT Home Exercise Plan  10/08/18: Hamstring stretch 3x30'' seated 1x/day    Consulted and Agree with Plan of Care  Patient       Patient will benefit from skilled therapeutic intervention in order to improve the following deficits and impairments:  Abnormal gait, Decreased balance, Decreased mobility, Difficulty walking, Decreased activity tolerance, Decreased strength, Increased fascial restricitons, Impaired flexibility, Postural dysfunction, Pain  Visit Diagnosis: Chronic low back pain, unspecified back pain laterality, unspecified whether sciatica present  Other symptoms and signs involving the musculoskeletal system     Problem List Patient Active Problem List   Diagnosis Date Noted  . Choledocholithiasis   . Common bile duct dilatation 07/14/2017  . Essential hypertension 07/14/2017  . Elevated LFTs 07/14/2017  . CKD (chronic kidney disease) stage 3, GFR 30-59 ml/min ([facility]) 07/14/2017  . CAP (community acquired pneumonia) 06/30/2017  . Hypercholesteremia 06/30/2017  . Glaucoma 06/30/2017  . Anxiety 06/30/2017  . Acid reflux 06/30/2017  . Osteoarthritis 07/21/2011  . Altered mental status 07/21/2011  . COPD (chronic obstructive pulmonary disease) ([place]) 07/21/2011  . UTI (lower urinary tract infection) 07/21/2011  . TOTAL HIP FOLLOW-UP 04/14/2008   . HIP PAIN 03/17/2008  . SCIATICA 03/17/2008  . CUBITAL TUNNEL SYNDROME 02/10/2008  . KNEE, ARTHRITIS, DEGEN./OSTEO 02/10/2008  . KNEE PAIN 02/10/2008  . NECK PAIN 12/24/2007  . SHOULDER PAIN 11/17/2007  . RUPTURE  ROTATOR CUFF 11/17/2007  . Type 2 diabetes mellitus ([place]) 05/13/2007   Clarene Critchley PT, DPT 4:02 PM, 10/08/18 [place], [place], [address] Phone: [phone]   Fax:  [phone]  Name: [name] MRN: [id number] Date of Birth: [birth year]

## 2018-10-08 NOTE — Patient Instructions (Signed)
Hamstring Stretch    Sit on a chair NOT an exercise ball. Inhale and straighten spine. Exhale and lean forward toward extended leg. Hold position for __30_ seconds while breathing. Inhale and come back to center. Repeat with other leg extended. Repeat 3 times, alternating legs. Do _1-2__ times per day.  Copyright  VHI. All rights reserved.

## 2018-10-10 ENCOUNTER — Encounter (HOSPITAL_COMMUNITY): Payer: Self-pay | Admitting: Physical Therapy

## 2018-10-10 ENCOUNTER — Ambulatory Visit (HOSPITAL_COMMUNITY): Payer: Medicare Other | Admitting: Physical Therapy

## 2018-10-10 DIAGNOSIS — G8929 Other chronic pain: Secondary | ICD-10-CM

## 2018-10-10 DIAGNOSIS — M545 Low back pain, unspecified: Secondary | ICD-10-CM

## 2018-10-10 DIAGNOSIS — R29898 Other symptoms and signs involving the musculoskeletal system: Secondary | ICD-10-CM | POA: Diagnosis not present

## 2018-10-10 NOTE — Therapy (Signed)
[place], [place], [address] Phone: [phone]   Fax:  [phone]  Physical Therapy Treatment  Patient Details  Name: [name] MRN: [id number] Date of Birth: [birth year] Referring Provider (PT): Carole Civil, MD   Encounter Date: 10/10/2018  PT End of Session - 10/10/18 1130    Visit Number  2    Number of Visits  8    Date for PT Re-Evaluation  11/05/18    Authorization Type  Medicare primary; Medicaid secondary    Authorization Time Period  10/08/18 - 11/05/18    Authorization - Visit Number  2    Authorization - Number of Visits  10    PT Start Time  1120    PT Stop Time  1158    PT Time Calculation (min)  38 min    Activity Tolerance  Patient tolerated treatment well    Behavior During Therapy  [facility] for tasks assessed/performed       Past Medical History:  Diagnosis Date  . Acid reflux   . Anxiety   . CKD (chronic kidney disease)   . COPD (chronic obstructive pulmonary disease) ([place])   . Depressed   . Diabetes mellitus   . Glaucoma   . Hypercholesteremia   . Hypertension   . Legally blind in right eye, as defined in [place]   . Panic attacks   . Type 2 diabetes mellitus ([place])     Past Surgical History:  Procedure Laterality Date  . APPENDECTOMY    . BALLOON DILATION  07/16/2017   Procedure: BALLOON DILATION;  Surgeon: Danie Binder, MD;  [facility];  Service: Endoscopy;;  . cataracts    . CHOLECYSTECTOMY    . ERCP N/A 07/16/2017   biliary papillary stenosis, filling defect c/w a stone and sludge, entire biliary tree dilated. completed biliary sphincterotomy and balloon extraction, stone removal  . ESOPHAGOGASTRODUODENOSCOPY  2006   Dr. Gala Romney: normal esophagus s/p empiric dilataton, small hiatal hernia  . HIP SURGERY     rod in leg as well  . MASS EXCISION Left 09/26/2017   Procedure: EXCISION MUCOID CYST WITH DISTAL INTERPHALANGEAL JOINT ARTHROTOMY OF LEFT MIDDLE  FINGER;  Surgeon: Leanora Cover, MD;  Location: [place];  Service: Orthopedics;  Laterality: Left;  . SPHINCTEROTOMY  07/16/2017   Procedure: SPHINCTEROTOMY;  Surgeon: Danie Binder, MD;  [facility];  Service: Endoscopy;;  . TUBAL LIGATION      There were no vitals filed for this visit.  Subjective Assessment - 10/10/18 1128    Subjective  Patient stated she has tried to do her exercise at home. She reported that her back has been aching today.     Pertinent History  Right total hip arthroplasty 2006    Limitations  Standing;Walking;Sitting;House hold activities    How long can you sit comfortably?  5-6 minutes    How long can you stand comfortably?  5 minutes    How long can you walk comfortably?  5 minutes    Diagnostic tests  From chart review: X-ray lumbar scoliosis DDD facet arthritis, Osteopenia spondylosis lumbar osteopenia; X-ray right hip: S/P right THA    Patient Stated Goals  Decreased pain    Currently in Pain?  Yes    Pain Score  9     Pain Location  Back    Pain Orientation  Mid    Pain Descriptors / Indicators  Aching  Pain Onset  More than a month ago         [facility] PT Assessment - 10/10/18 0001      Posture/Postural Control   Posture/Postural Control  Postural limitations    Postural Limitations  Rounded Shoulders;Forward head;Increased thoracic kyphosis                   [facility] Adult PT Treatment/Exercise - 10/10/18 0001      Exercises   Exercises  Knee/Hip;Lumbar      Lumbar Exercises: Stretches   Lower Trunk Rotation  10 seconds;Other (comment)    Lower Trunk Rotation Limitations  10 repetitions      Lumbar Exercises: Seated   Other Seated Lumbar Exercises  Posterior shoulder rolls 10x with tactile and verbal cues    Other Seated Lumbar Exercises  Scapular retraction x 15 2'' holds. Upright sitting posture 2 x 1 minute. Rows 3'' holds x 10       Lumbar Exercises: Supine   Ab Set  10 reps    AB Set  Limitations  5''    Bent Knee Raise  20 reps;2 seconds    Bent Knee Raise Limitations  Alternating legs    Bridge  10 reps;2 seconds      Knee/Hip Exercises: Stretches   Passive Hamstring Stretch  Right;Left;3 reps;30 seconds    Passive Hamstring Stretch Limitations  Seated with verbal and tactile cues for form             PT Education - 10/10/18 1129    Education Details  Educated patient on purpose and technique of interventions throughout session.     Person(s) Educated  Patient    Methods  Explanation    Comprehension  Verbalized understanding       PT Short Term Goals - 10/10/18 1121      PT SHORT TERM GOAL #1   Title  Patient will report understanding and regular compliance with HEP in order to improve strength, balance, flexibility and lumbar mobility.     Time  2    Period  Weeks    Status  On-going    Target Date  10/22/18      PT SHORT TERM GOAL #2   Title  Patient will report ability to stand for at least 10 minutes in order to do dishes without having to sit.     Time  2    Period  Weeks    Status  On-going    Target Date  10/22/18        PT Long Term Goals - 10/10/18 1122      PT LONG TERM GOAL #1   Title  Patient will report that her back pain has not exceeded a 3/10 over the course of a 1 week period indicating improved tolerance to daily activities.     Time  4    Period  Weeks    Status  On-going      PT LONG TERM GOAL #2   Title  Patient will demonstrate improvement of at least 1/2 MMT strength grade in all deficient muscle groups in order to improve ability to ambulate and perform other functional activities.     Time  4    Period  Weeks    Status  On-going      PT LONG TERM GOAL #3   Title  Patient will demonstrate an improvement of at least 10% on FOTO indicating improved percieved functional mobility.  Time  4    Period  Weeks    Status  On-going            Plan - 10/10/18 1138    Clinical Impression Statement  Began  with reviewing patient's evaluation and goals with patient giving her consent to the goals set for her. Then reviewed patient's HEP. Then worked on lumbar stretching to improve lumbar mobility. Then worked on core strengthening exercises. Then performed seated exercises to improve patient's posture. Patient would benefit from continued skilled physical therapy in order to continue progressing towards functional goals.     Rehab Potential  Fair    Clinical Impairments Affecting Rehab Potential  Positive: Motivated; Negative: comorbidities    PT Frequency  2x / week    PT Duration  4 weeks    PT Treatment/Interventions  ADLs/Self Care Home Management;DME Instruction;Gait training;Stair training;Functional mobility training;Therapeutic activities;Therapeutic exercise;Balance training;Neuromuscular re-education;Patient/family education;Orthotic Fit/Training;Manual techniques;Passive range of motion;Dry needling;Energy conservation;Taping    PT Next Visit Plan  Focus on lumbar ROM (H/O right THA) and core strengthening and progress to functional lower extremity strengthening.     PT Home Exercise Plan  10/08/18: Hamstring stretch 3x30'' seated 1x/day    Consulted and Agree with Plan of Care  Patient       Patient will benefit from skilled therapeutic intervention in order to improve the following deficits and impairments:  Abnormal gait, Decreased balance, Decreased mobility, Difficulty walking, Decreased activity tolerance, Decreased strength, Increased fascial restricitons, Impaired flexibility, Postural dysfunction, Pain  Visit Diagnosis: Chronic low back pain, unspecified back pain laterality, unspecified whether sciatica present  Other symptoms and signs involving the musculoskeletal system     Problem List Patient Active Problem List   Diagnosis Date Noted  . Choledocholithiasis   . Common bile duct dilatation 07/14/2017  . Essential hypertension 07/14/2017  . Elevated LFTs 07/14/2017  .  CKD (chronic kidney disease) stage 3, GFR 30-59 ml/min (HCC) 07/14/2017  . CAP (community acquired pneumonia) 06/30/2017  . Hypercholesteremia 06/30/2017  . Glaucoma 06/30/2017  . Anxiety 06/30/2017  . Acid reflux 06/30/2017  . Osteoarthritis 07/21/2011  . Altered mental status 07/21/2011  . COPD (chronic obstructive pulmonary disease) ([place]) 07/21/2011  . UTI (lower urinary tract infection) 07/21/2011  . TOTAL HIP FOLLOW-UP 04/14/2008  . HIP PAIN 03/17/2008  . SCIATICA 03/17/2008  . CUBITAL TUNNEL SYNDROME 02/10/2008  . KNEE, ARTHRITIS, DEGEN./OSTEO 02/10/2008  . KNEE PAIN 02/10/2008  . NECK PAIN 12/24/2007  . SHOULDER PAIN 11/17/2007  . RUPTURE ROTATOR CUFF 11/17/2007  . Type 2 diabetes mellitus ([place]) 05/13/2007   Clarene Critchley PT, DPT 12:01 PM, 10/10/18 [place] [address] [place], [place], [address] Phone: [phone]   Fax:  [phone]  Name: [name] MRN: [id number] Date of Birth: [birth year]

## 2018-10-10 NOTE — Patient Instructions (Signed)
Bridge    Lie back, legs bent. Inhale, pressing hips up. Keeping ribs in, lengthen lower back. Exhale, rolling down along spine from top. Repeat _10___ times. Do _1___ sessions per day.  http://pm.exer.us/55   Copyright  VHI. All rights reserved.

## 2018-10-14 ENCOUNTER — Ambulatory Visit (HOSPITAL_COMMUNITY): Payer: Medicare Other

## 2018-10-14 ENCOUNTER — Encounter (HOSPITAL_COMMUNITY): Payer: Self-pay

## 2018-10-14 DIAGNOSIS — M545 Low back pain, unspecified: Secondary | ICD-10-CM

## 2018-10-14 DIAGNOSIS — R29898 Other symptoms and signs involving the musculoskeletal system: Secondary | ICD-10-CM | POA: Diagnosis not present

## 2018-10-14 DIAGNOSIS — G8929 Other chronic pain: Secondary | ICD-10-CM

## 2018-10-14 NOTE — Therapy (Signed)
[place], [place], [address] Phone: [phone]   Fax:  [phone]  Physical Therapy Treatment  Patient Details  Name: [name] MRN: [id number] Date of Birth: 04-Jan-1929 Referring Provider (PT): Carole Civil, MD   Encounter Date: 10/14/2018  PT End of Session - 10/14/18 1124    Visit Number  3    Number of Visits  8    Date for PT Re-Evaluation  11/05/18    Authorization Type  Medicare primary; Medicaid secondary    Authorization Time Period  10/08/18 - 11/05/18    Authorization - Visit Number  3    Authorization - Number of Visits  10    PT Start Time  1118    PT Stop Time  1158    PT Time Calculation (min)  40 min    Activity Tolerance  Patient tolerated treatment well    Behavior During Therapy  [facility] for tasks assessed/performed       Past Medical History:  Diagnosis Date  . Acid reflux   . Anxiety   . CKD (chronic kidney disease)   . COPD (chronic obstructive pulmonary disease) ([place])   . Depressed   . Diabetes mellitus   . Glaucoma   . Hypercholesteremia   . Hypertension   . Legally blind in right eye, as defined in [place]   . Panic attacks   . Type 2 diabetes mellitus ([place])     Past Surgical History:  Procedure Laterality Date  . APPENDECTOMY    . BALLOON DILATION  07/16/2017   Procedure: BALLOON DILATION;  Surgeon: Danie Binder, MD;  [facility];  Service: Endoscopy;;  . cataracts    . CHOLECYSTECTOMY    . ERCP N/A 07/16/2017   biliary papillary stenosis, filling defect c/w a stone and sludge, entire biliary tree dilated. completed biliary sphincterotomy and balloon extraction, stone removal  . ESOPHAGOGASTRODUODENOSCOPY  2006   Dr. Gala Romney: normal esophagus s/p empiric dilataton, small hiatal hernia  . HIP SURGERY     rod in leg as well  . MASS EXCISION Left 09/26/2017   Procedure: EXCISION MUCOID CYST WITH DISTAL INTERPHALANGEAL JOINT ARTHROTOMY OF LEFT MIDDLE  FINGER;  Surgeon: Leanora Cover, MD;  Location: [place];  Service: Orthopedics;  Laterality: Left;  . SPHINCTEROTOMY  07/16/2017   Procedure: SPHINCTEROTOMY;  Surgeon: Danie Binder, MD;  [facility];  Service: Endoscopy;;  . TUBAL LIGATION      There were no vitals filed for this visit.  Subjective Assessment - 10/14/18 1122    Subjective  Pt stated she has achey pain across her lower back, pain scale 9/10    Pertinent History  Right total hip arthroplasty 2006    Patient Stated Goals  Decreased pain    Currently in Pain?  Yes    Pain Score  7     Pain Location  Back    Pain Orientation  Lower    Pain Descriptors / Indicators  Aching    Pain Type  Chronic pain    Pain Onset  More than a month ago    Aggravating Factors   Sitting a long time    Pain Relieving Factors  Moving    Effect of Pain on Daily Activities  Moderately affects                       [facility] Adult PT Treatment/Exercise -  10/14/18 0001      Lumbar Exercises: Stretches   Active Hamstring Stretch  Right;Left;2 reps;30 seconds    Active Hamstring Stretch Limitations  supine with hands behind knee    Lower Trunk Rotation  10 seconds;5 reps    Lower Trunk Rotation Limitations  5x10"      Lumbar Exercises: Seated   Sit to Stand  Limitations    Sit to Stand Limitations  increased height to 23in, limited by weakness and knee pain    Other Seated Lumbar Exercises  Posterior shoulder rolls 10x with tactile and verbal cues    Other Seated Lumbar Exercises  Sitting tall;  Wback 10x; RTB rows 10x 2      Lumbar Exercises: Supine   Bent Knee Raise  20 reps;2 seconds    Bent Knee Raise Limitations  Alternating legs    Bridge  10 reps;2 seconds      Lumbar Exercises: Sidelying   Clam  5 reps;3 seconds    Clam Limitations  pain Rt hip                PT Short Term Goals - 10/10/18 1121      PT SHORT TERM GOAL #1   Title  Patient will report understanding and  regular compliance with HEP in order to improve strength, balance, flexibility and lumbar mobility.     Time  2    Period  Weeks    Status  On-going    Target Date  10/22/18      PT SHORT TERM GOAL #2   Title  Patient will report ability to stand for at least 10 minutes in order to do dishes without having to sit.     Time  2    Period  Weeks    Status  On-going    Target Date  10/22/18        PT Long Term Goals - 10/10/18 1122      PT LONG TERM GOAL #1   Title  Patient will report that her back pain has not exceeded a 3/10 over the course of a 1 week period indicating improved tolerance to daily activities.     Time  4    Period  Weeks    Status  On-going      PT LONG TERM GOAL #2   Title  Patient will demonstrate improvement of at least 1/2 MMT strength grade in all deficient muscle groups in order to improve ability to ambulate and perform other functional activities.     Time  4    Period  Weeks    Status  On-going      PT LONG TERM GOAL #3   Title  Patient will demonstrate an improvement of at least 10% on FOTO indicating improved percieved functional mobility.     Time  4    Period  Weeks    Status  On-going            Plan - 10/14/18 1207    Clinical Impression Statement  Session focus on core and proximal strengthening as well as stretches to address lumbar mobility.  Some cueing required to improve abdominal contractions and incorporated breathing.  Added clams for gluteal strengthening, pt reports some pain wiht Rt sidelying.  EOS reports of LBP reduced and no pain in hip following position changes,      Rehab Potential  Fair    Clinical Impairments Affecting Rehab Potential  Positive: Motivated; Negative: comorbidities  PT Frequency  2x / week    PT Duration  4 weeks    PT Treatment/Interventions  ADLs/Self Care Home Management;DME Instruction;Gait training;Stair training;Functional mobility training;Therapeutic activities;Therapeutic exercise;Balance  training;Neuromuscular re-education;Patient/family education;Orthotic Fit/Training;Manual techniques;Passive range of motion;Dry needling;Energy conservation;Taping    PT Next Visit Plan  Focus on lumbar ROM (H/O right THA) and core strengthening and progress to functional lower extremity strengthening.     PT Home Exercise Plan  10/08/18: Hamstring stretch 3x30'' seated 1x/day       Patient will benefit from skilled therapeutic intervention in order to improve the following deficits and impairments:  Abnormal gait, Decreased balance, Decreased mobility, Difficulty walking, Decreased activity tolerance, Decreased strength, Increased fascial restricitons, Impaired flexibility, Postural dysfunction, Pain  Visit Diagnosis: Chronic low back pain, unspecified back pain laterality, unspecified whether sciatica present  Other symptoms and signs involving the musculoskeletal system     Problem List Patient Active Problem List   Diagnosis Date Noted  . Choledocholithiasis   . Common bile duct dilatation 07/14/2017  . Essential hypertension 07/14/2017  . Elevated LFTs 07/14/2017  . CKD (chronic kidney disease) stage 3, GFR 30-59 ml/min (HCC) 07/14/2017  . CAP (community acquired pneumonia) 06/30/2017  . Hypercholesteremia 06/30/2017  . Glaucoma 06/30/2017  . Anxiety 06/30/2017  . Acid reflux 06/30/2017  . Osteoarthritis 07/21/2011  . Altered mental status 07/21/2011  . COPD (chronic obstructive pulmonary disease) ([place]) 07/21/2011  . UTI (lower urinary tract infection) 07/21/2011  . TOTAL HIP FOLLOW-UP 04/14/2008  . HIP PAIN 03/17/2008  . SCIATICA 03/17/2008  . CUBITAL TUNNEL SYNDROME 02/10/2008  . KNEE, ARTHRITIS, DEGEN./OSTEO 02/10/2008  . KNEE PAIN 02/10/2008  . NECK PAIN 12/24/2007  . SHOULDER PAIN 11/17/2007  . RUPTURE ROTATOR CUFF 11/17/2007  . Type 2 diabetes mellitus ([place]) 05/13/2007   Ihor Austin, [place]; [place]  Aldona Lento 10/14/2018, 12:10  PM  [place], [place], [address] Phone: [phone]   Fax:  [phone]  Name: [name] MRN: [id number] Date of Birth: Jul 01, 1929

## 2018-10-16 ENCOUNTER — Ambulatory Visit (HOSPITAL_COMMUNITY): Payer: Medicare Other | Admitting: Physical Therapy

## 2018-10-16 ENCOUNTER — Encounter (HOSPITAL_COMMUNITY): Payer: Self-pay | Admitting: Physical Therapy

## 2018-10-16 DIAGNOSIS — G8929 Other chronic pain: Secondary | ICD-10-CM

## 2018-10-16 DIAGNOSIS — M545 Low back pain, unspecified: Secondary | ICD-10-CM

## 2018-10-16 DIAGNOSIS — R29898 Other symptoms and signs involving the musculoskeletal system: Secondary | ICD-10-CM

## 2018-10-16 NOTE — Therapy (Signed)
[place], [place], [address] Phone: [phone]   Fax:  [phone]  Physical Therapy Treatment  Patient Details  Name: [name] MRN: [id number] Date of Birth: 1929-07-17 Referring Provider (PT): Carole Civil, MD   Encounter Date: 10/16/2018  PT End of Session - 10/16/18 1324    Visit Number  4    Number of Visits  8    Date for PT Re-Evaluation  11/05/18    Authorization Type  Medicare primary; Medicaid secondary    Authorization Time Period  10/08/18 - 11/05/18    Authorization - Visit Number  4    Authorization - Number of Visits  10    PT Start Time  1300    PT Stop Time  1340    PT Time Calculation (min)  40 min    Activity Tolerance  Patient tolerated treatment well    Behavior During Therapy  [facility] for tasks assessed/performed       Past Medical History:  Diagnosis Date  . Acid reflux   . Anxiety   . CKD (chronic kidney disease)   . COPD (chronic obstructive pulmonary disease) ([place])   . Depressed   . Diabetes mellitus   . Glaucoma   . Hypercholesteremia   . Hypertension   . Legally blind in right eye, as defined in [place]   . Panic attacks   . Type 2 diabetes mellitus ([place])     Past Surgical History:  Procedure Laterality Date  . APPENDECTOMY    . BALLOON DILATION  07/16/2017   Procedure: BALLOON DILATION;  Surgeon: Danie Binder, MD;  [facility];  Service: Endoscopy;;  . cataracts    . CHOLECYSTECTOMY    . ERCP N/A 07/16/2017   biliary papillary stenosis, filling defect c/w a stone and sludge, entire biliary tree dilated. completed biliary sphincterotomy and balloon extraction, stone removal  . ESOPHAGOGASTRODUODENOSCOPY  2006   Dr. Gala Romney: normal esophagus s/p empiric dilataton, small hiatal hernia  . HIP SURGERY     rod in leg as well  . MASS EXCISION Left 09/26/2017   Procedure: EXCISION MUCOID CYST WITH DISTAL INTERPHALANGEAL JOINT ARTHROTOMY OF LEFT MIDDLE  FINGER;  Surgeon: Leanora Cover, MD;  Location: [place];  Service: Orthopedics;  Laterality: Left;  . SPHINCTEROTOMY  07/16/2017   Procedure: SPHINCTEROTOMY;  Surgeon: Danie Binder, MD;  [facility];  Service: Endoscopy;;  . TUBAL LIGATION      There were no vitals filed for this visit.  Subjective Assessment - 10/16/18 1304    Subjective  Patient reported that her left knee is bothering her, but not her back.     Pertinent History  Right total hip arthroplasty 2006    Patient Stated Goals  Decreased pain    Currently in Pain?  Yes    Pain Score  7     Pain Location  Knee    Pain Orientation  Left    Pain Descriptors / Indicators  Other (Comment)   Catching                      [facility] Adult PT Treatment/Exercise - 10/16/18 0001      Lumbar Exercises: Stretches   Active Hamstring Stretch  Right;Left;2 reps;30 seconds    Active Hamstring Stretch Limitations  supine with hands behind knee    Lower Trunk Rotation  10 seconds;5 reps    Lower  Trunk Rotation Limitations  5x10"      Lumbar Exercises: Seated   Sit to Stand  Limitations    Sit to Stand Limitations  increased height to 24in, limited by weakness and knee pain x 4 repetitions    Other Seated Lumbar Exercises  Posterior shoulder rolls 10x with tactile and verbal cues    Other Seated Lumbar Exercises  Abdominal set with marching 20x 2'' holds. Sitting tall;  Wback 10x; RTB rows 10x 2      Lumbar Exercises: Supine   Bridge  10 reps;2 seconds    Straight Leg Raise  10 reps;2 seconds      Lumbar Exercises: Sidelying   Clam  5 reps;3 seconds               PT Short Term Goals - 10/10/18 1121      PT SHORT TERM GOAL #1   Title  Patient will report understanding and regular compliance with HEP in order to improve strength, balance, flexibility and lumbar mobility.     Time  2    Period  Weeks    Status  On-going    Target Date  10/22/18      PT SHORT TERM GOAL #2    Title  Patient will report ability to stand for at least 10 minutes in order to do dishes without having to sit.     Time  2    Period  Weeks    Status  On-going    Target Date  10/22/18        PT Long Term Goals - 10/10/18 1122      PT LONG TERM GOAL #1   Title  Patient will report that her back pain has not exceeded a 3/10 over the course of a 1 week period indicating improved tolerance to daily activities.     Time  4    Period  Weeks    Status  On-going      PT LONG TERM GOAL #2   Title  Patient will demonstrate improvement of at least 1/2 MMT strength grade in all deficient muscle groups in order to improve ability to ambulate and perform other functional activities.     Time  4    Period  Weeks    Status  On-going      PT LONG TERM GOAL #3   Title  Patient will demonstrate an improvement of at least 10% on FOTO indicating improved percieved functional mobility.     Time  4    Period  Weeks    Status  On-going            Plan - 10/16/18 1351    Clinical Impression Statement  This session progressed patient from supine marching with abdominal set to seated marching with abdominal set. This session also added supine straight leg raises. Limited sit to stands due to patient's knee pain. Elevated mat height with sit to stands, however, patient continued to complain of left knee pain and therefore limited the sit to stands this session.     Rehab Potential  Fair    Clinical Impairments Affecting Rehab Potential  Positive: Motivated; Negative: comorbidities    PT Frequency  2x / week    PT Duration  4 weeks    PT Treatment/Interventions  ADLs/Self Care Home Management;DME Instruction;Gait training;Stair training;Functional mobility training;Therapeutic activities;Therapeutic exercise;Balance training;Neuromuscular re-education;Patient/family education;Orthotic Fit/Training;Manual techniques;Passive range of motion;Dry needling;Energy conservation;Taping    PT Next Visit Plan  Focus on lumbar ROM (H/O right THA) and core strengthening and progress to functional lower extremity strengthening.     PT Home Exercise Plan  10/08/18: Hamstring stretch 3x30'' seated 1x/day       Patient will benefit from skilled therapeutic intervention in order to improve the following deficits and impairments:  Abnormal gait, Decreased balance, Decreased mobility, Difficulty walking, Decreased activity tolerance, Decreased strength, Increased fascial restricitons, Impaired flexibility, Postural dysfunction, Pain  Visit Diagnosis: Chronic low back pain, unspecified back pain laterality, unspecified whether sciatica present  Other symptoms and signs involving the musculoskeletal system     Problem List Patient Active Problem List   Diagnosis Date Noted  . Choledocholithiasis   . Common bile duct dilatation 07/14/2017  . Essential hypertension 07/14/2017  . Elevated LFTs 07/14/2017  . CKD (chronic kidney disease) stage 3, GFR 30-59 ml/min (HCC) 07/14/2017  . CAP (community acquired pneumonia) 06/30/2017  . Hypercholesteremia 06/30/2017  . Glaucoma 06/30/2017  . Anxiety 06/30/2017  . Acid reflux 06/30/2017  . Osteoarthritis 07/21/2011  . Altered mental status 07/21/2011  . COPD (chronic obstructive pulmonary disease) ([place]) 07/21/2011  . UTI (lower urinary tract infection) 07/21/2011  . TOTAL HIP FOLLOW-UP 04/14/2008  . HIP PAIN 03/17/2008  . SCIATICA 03/17/2008  . CUBITAL TUNNEL SYNDROME 02/10/2008  . KNEE, ARTHRITIS, DEGEN./OSTEO 02/10/2008  . KNEE PAIN 02/10/2008  . NECK PAIN 12/24/2007  . SHOULDER PAIN 11/17/2007  . RUPTURE ROTATOR CUFF 11/17/2007  . Type 2 diabetes mellitus ([place]) 05/13/2007   Clarene Critchley PT, DPT 1:53 PM, 10/16/18 [place] [address] [place], [place], [address] Phone: [phone]   Fax:  [phone]  Name: [name] MRN: [id number] Date of Birth: [birth year]

## 2018-10-21 ENCOUNTER — Encounter (HOSPITAL_COMMUNITY): Payer: Medicare Other

## 2018-10-21 DIAGNOSIS — I129 Hypertensive chronic kidney disease with stage 1 through stage 4 chronic kidney disease, or unspecified chronic kidney disease: Secondary | ICD-10-CM | POA: Diagnosis not present

## 2018-10-21 DIAGNOSIS — N184 Chronic kidney disease, stage 4 (severe): Secondary | ICD-10-CM | POA: Diagnosis not present

## 2018-10-21 DIAGNOSIS — J449 Chronic obstructive pulmonary disease, unspecified: Secondary | ICD-10-CM | POA: Diagnosis not present

## 2018-10-21 DIAGNOSIS — Z23 Encounter for immunization: Secondary | ICD-10-CM | POA: Diagnosis not present

## 2018-10-21 DIAGNOSIS — E1121 Type 2 diabetes mellitus with diabetic nephropathy: Secondary | ICD-10-CM | POA: Diagnosis not present

## 2018-10-22 ENCOUNTER — Ambulatory Visit (HOSPITAL_COMMUNITY): Payer: Medicare Other | Admitting: Physical Therapy

## 2018-10-22 ENCOUNTER — Encounter (HOSPITAL_COMMUNITY): Payer: Self-pay | Admitting: Physical Therapy

## 2018-10-22 DIAGNOSIS — R29898 Other symptoms and signs involving the musculoskeletal system: Secondary | ICD-10-CM

## 2018-10-22 DIAGNOSIS — M545 Low back pain, unspecified: Secondary | ICD-10-CM

## 2018-10-22 DIAGNOSIS — G8929 Other chronic pain: Secondary | ICD-10-CM

## 2018-10-22 NOTE — Therapy (Signed)
[place], [place], [address] Phone: [phone]   Fax:  [phone]  Physical Therapy Treatment  Patient Details  Name: [name] MRN: [id number] Date of Birth: 07/02/1929 Referring Provider (PT): Carole Civil, MD   Encounter Date: 10/22/2018  PT End of Session - 10/22/18 1122    Visit Number  5    Number of Visits  8    Date for PT Re-Evaluation  11/05/18    Authorization Type  Medicare primary; Medicaid secondary    Authorization Time Period  10/08/18 - 11/05/18    Authorization - Visit Number  5    Authorization - Number of Visits  10    PT Start Time  1117    PT Stop Time  1156    PT Time Calculation (min)  39 min    Activity Tolerance  Patient tolerated treatment well    Behavior During Therapy  [facility] for tasks assessed/performed       Past Medical History:  Diagnosis Date  . Acid reflux   . Anxiety   . CKD (chronic kidney disease)   . COPD (chronic obstructive pulmonary disease) ([place])   . Depressed   . Diabetes mellitus   . Glaucoma   . Hypercholesteremia   . Hypertension   . Legally blind in right eye, as defined in [place]   . Panic attacks   . Type 2 diabetes mellitus ([place])     Past Surgical History:  Procedure Laterality Date  . APPENDECTOMY    . BALLOON DILATION  07/16/2017   Procedure: BALLOON DILATION;  Surgeon: Danie Binder, MD;  [facility];  Service: Endoscopy;;  . cataracts    . CHOLECYSTECTOMY    . ERCP N/A 07/16/2017   biliary papillary stenosis, filling defect c/w a stone and sludge, entire biliary tree dilated. completed biliary sphincterotomy and balloon extraction, stone removal  . ESOPHAGOGASTRODUODENOSCOPY  2006   Dr. Gala Romney: normal esophagus s/p empiric dilataton, small hiatal hernia  . HIP SURGERY     rod in leg as well  . MASS EXCISION Left 09/26/2017   Procedure: EXCISION MUCOID CYST WITH DISTAL INTERPHALANGEAL JOINT ARTHROTOMY OF LEFT MIDDLE  FINGER;  Surgeon: Leanora Cover, MD;  Location: [place];  Service: Orthopedics;  Laterality: Left;  . SPHINCTEROTOMY  07/16/2017   Procedure: SPHINCTEROTOMY;  Surgeon: Danie Binder, MD;  [facility];  Service: Endoscopy;;  . TUBAL LIGATION      There were no vitals filed for this visit.  Subjective Assessment - 10/22/18 1121    Subjective  Patient reported that her left knee is bothering her today.     Pertinent History  Right total hip arthroplasty 2006    Patient Stated Goals  Decreased pain    Currently in Pain?  Yes    Pain Score  4     Pain Location  Knee    Pain Orientation  Left    Pain Descriptors / Indicators  Other (Comment)   Catching   Pain Type  Chronic pain                       [facility] Adult PT Treatment/Exercise - 10/22/18 0001      Lumbar Exercises: Stretches   Active Hamstring Stretch  Right;Left;2 reps;30 seconds    Active Hamstring Stretch Limitations  supine with strap    Lower Trunk Rotation  10 seconds;5 reps  Lower Trunk Rotation Limitations  5x10"      Lumbar Exercises: Seated   Other Seated Lumbar Exercises  Posterior shoulder rolls 10x with tactile and verbal cues and breathing incoporated    Other Seated Lumbar Exercises  Abdominal set with marching 20x 2'' holds. Sitting tall;  Wback 10x; RTB rows 10x 2      Lumbar Exercises: Supine   Straight Leg Raise  10 reps;2 seconds      Lumbar Exercises: Sidelying   Clam  10 reps;3 seconds      Knee/Hip Exercises: Standing   Heel Raises  Both;1 set;10 reps    Heel Raises Limitations  Heels and toes with UE support    Hip Abduction  AROM;Stengthening;Right;Left;1 set;10 reps;Knee straight    Abduction Limitations  Bilateral UE assistance    Hip Extension  Stengthening;Right;Left;1 set;10 reps;Knee straight    Extension Limitations  Bilateral upper extremity assistance    Functional Squat  1 set;10 reps    Functional Squat Limitations  mini-squat with  bilateral upper extremity assistance (modified from sit to stands due to knee pain)    Other Standing Knee Exercises  Sidestepping inside parallel bars with bilateral UE support x3 roundtrips             PT Education - 10/22/18 1200    Education Details  Educated on purpose and technique of interventions throughout session.     Person(s) Educated  Patient    Methods  Explanation;Tactile cues;Verbal cues    Comprehension  Verbalized understanding       PT Short Term Goals - 10/10/18 1121      PT SHORT TERM GOAL #1   Title  Patient will report understanding and regular compliance with HEP in order to improve strength, balance, flexibility and lumbar mobility.     Time  2    Period  Weeks    Status  On-going    Target Date  10/22/18      PT SHORT TERM GOAL #2   Title  Patient will report ability to stand for at least 10 minutes in order to do dishes without having to sit.     Time  2    Period  Weeks    Status  On-going    Target Date  10/22/18        PT Long Term Goals - 10/10/18 1122      PT LONG TERM GOAL #1   Title  Patient will report that her back pain has not exceeded a 3/10 over the course of a 1 week period indicating improved tolerance to daily activities.     Time  4    Period  Weeks    Status  On-going      PT LONG TERM GOAL #2   Title  Patient will demonstrate improvement of at least 1/2 MMT strength grade in all deficient muscle groups in order to improve ability to ambulate and perform other functional activities.     Time  4    Period  Weeks    Status  On-going      PT LONG TERM GOAL #3   Title  Patient will demonstrate an improvement of at least 10% on FOTO indicating improved percieved functional mobility.     Time  4    Period  Weeks    Status  On-going            Plan - 10/22/18 1203    Clinical Impression Statement  This  session continued with established plan of care. Working on lower extremity strengthening this session increasing  repetitions with clams, standing hip extension and abduction, and sidestepping inside parallel bars. Modified to patient performing mini-squats instead of sit to stands to decrease force and pain in patient's left knee. Patient did not report an increase in pain throughout session. Educated patient on delayed onset muscle soreness.     Rehab Potential  Fair    Clinical Impairments Affecting Rehab Potential  Positive: Motivated; Negative: comorbidities    PT Frequency  2x / week    PT Duration  4 weeks    PT Treatment/Interventions  ADLs/Self Care Home Management;DME Instruction;Gait training;Stair training;Functional mobility training;Therapeutic activities;Therapeutic exercise;Balance training;Neuromuscular re-education;Patient/family education;Orthotic Fit/Training;Manual techniques;Passive range of motion;Dry needling;Energy conservation;Taping    PT Next Visit Plan  Focus on lumbar ROM (H/O right THA) and core strengthening and progress to functional lower extremity strengthening. Resume sit to stands if patient's knee has decreased pain.     PT Home Exercise Plan  10/08/18: Hamstring stretch 3x30'' seated 1x/day       Patient will benefit from skilled therapeutic intervention in order to improve the following deficits and impairments:  Abnormal gait, Decreased balance, Decreased mobility, Difficulty walking, Decreased activity tolerance, Decreased strength, Increased fascial restricitons, Impaired flexibility, Postural dysfunction, Pain  Visit Diagnosis: Chronic low back pain, unspecified back pain laterality, unspecified whether sciatica present  Other symptoms and signs involving the musculoskeletal system     Problem List Patient Active Problem List   Diagnosis Date Noted  . Choledocholithiasis   . Common bile duct dilatation 07/14/2017  . Essential hypertension 07/14/2017  . Elevated LFTs 07/14/2017  . CKD (chronic kidney disease) stage 3, GFR 30-59 ml/min (HCC) 07/14/2017  . CAP  (community acquired pneumonia) 06/30/2017  . Hypercholesteremia 06/30/2017  . Glaucoma 06/30/2017  . Anxiety 06/30/2017  . Acid reflux 06/30/2017  . Osteoarthritis 07/21/2011  . Altered mental status 07/21/2011  . COPD (chronic obstructive pulmonary disease) ([place]) 07/21/2011  . UTI (lower urinary tract infection) 07/21/2011  . TOTAL HIP FOLLOW-UP 04/14/2008  . HIP PAIN 03/17/2008  . SCIATICA 03/17/2008  . CUBITAL TUNNEL SYNDROME 02/10/2008  . KNEE, ARTHRITIS, DEGEN./OSTEO 02/10/2008  . KNEE PAIN 02/10/2008  . NECK PAIN 12/24/2007  . SHOULDER PAIN 11/17/2007  . RUPTURE ROTATOR CUFF 11/17/2007  . Type 2 diabetes mellitus ([place]) 05/13/2007   Clarene Critchley PT, DPT 12:04 PM, 10/22/18 [place], [place], [address] Phone: [phone]   Fax:  [phone]  Name: [name] MRN: [id number] Date of Birth: December 03, 1928

## 2018-10-23 ENCOUNTER — Encounter (HOSPITAL_COMMUNITY): Payer: Medicare Other | Admitting: Physical Therapy

## 2018-10-24 ENCOUNTER — Encounter (HOSPITAL_COMMUNITY): Payer: Self-pay | Admitting: Physical Therapy

## 2018-10-24 ENCOUNTER — Ambulatory Visit (HOSPITAL_COMMUNITY): Payer: Medicare Other | Admitting: Physical Therapy

## 2018-10-24 DIAGNOSIS — R29898 Other symptoms and signs involving the musculoskeletal system: Secondary | ICD-10-CM

## 2018-10-24 DIAGNOSIS — M545 Low back pain: Secondary | ICD-10-CM | POA: Diagnosis not present

## 2018-10-24 DIAGNOSIS — G8929 Other chronic pain: Secondary | ICD-10-CM

## 2018-10-24 NOTE — Patient Instructions (Signed)
Bridge    Lie back, legs bent. Inhale, pressing hips up. Keeping ribs in, lengthen lower back. Exhale, rolling down along spine from top. Repeat __10__ times. Do _1___ sessions per day.  http://pm.exer.us/55   Copyright  VHI. All rights reserved.  Abduction: Clam (Eccentric) - Side-Lying    Lie on side with knees bent. Lift top knee, keeping feet together. Keep trunk steady. Slowly lower for 3-5 seconds. _10__ reps per set, __1_ sets per day, _7__ days per week.   http://ecce.exer.us/65   Copyright  VHI. All rights reserved.

## 2018-10-24 NOTE — Therapy (Signed)
[place], [place], [address] Phone: [phone]   Fax:  [phone]  Physical Therapy Treatment  Patient Details  Name: [name] MRN: [id number] Date of Birth: 1928-11-20 Referring Provider (PT): Carole Civil, MD   Encounter Date: 10/24/2018  PT End of Session - 10/24/18 1126    Visit Number  6    Number of Visits  8    Date for PT Re-Evaluation  11/05/18    Authorization Type  Medicare primary; Medicaid secondary    Authorization Time Period  10/08/18 - 11/05/18    Authorization - Visit Number  6    Authorization - Number of Visits  10    PT Start Time  1112    PT Stop Time  1152    PT Time Calculation (min)  40 min    Equipment Utilized During Treatment  Gait belt    Activity Tolerance  Patient tolerated treatment well    Behavior During Therapy  [facility] for tasks assessed/performed       Past Medical History:  Diagnosis Date  . Acid reflux   . Anxiety   . CKD (chronic kidney disease)   . COPD (chronic obstructive pulmonary disease) ([place])   . Depressed   . Diabetes mellitus   . Glaucoma   . Hypercholesteremia   . Hypertension   . Legally blind in right eye, as defined in [place]   . Panic attacks   . Type 2 diabetes mellitus ([place])     Past Surgical History:  Procedure Laterality Date  . APPENDECTOMY    . BALLOON DILATION  07/16/2017   Procedure: BALLOON DILATION;  Surgeon: Danie Binder, MD;  [facility];  Service: Endoscopy;;  . cataracts    . CHOLECYSTECTOMY    . ERCP N/A 07/16/2017   biliary papillary stenosis, filling defect c/w a stone and sludge, entire biliary tree dilated. completed biliary sphincterotomy and balloon extraction, stone removal  . ESOPHAGOGASTRODUODENOSCOPY  2006   Dr. Gala Romney: normal esophagus s/p empiric dilataton, small hiatal hernia  . HIP SURGERY     rod in leg as well  . MASS EXCISION Left 09/26/2017   Procedure: EXCISION MUCOID CYST WITH DISTAL  INTERPHALANGEAL JOINT ARTHROTOMY OF LEFT MIDDLE FINGER;  Surgeon: Leanora Cover, MD;  Location: [place];  Service: Orthopedics;  Laterality: Left;  . SPHINCTEROTOMY  07/16/2017   Procedure: SPHINCTEROTOMY;  Surgeon: Danie Binder, MD;  [facility];  Service: Endoscopy;;  . TUBAL LIGATION      There were no vitals filed for this visit.  Subjective Assessment - 10/24/18 1115    Subjective  Patient stated that her left knee bothered her last night, but she is not having pain currently.     Pertinent History  Right total hip arthroplasty 2006    Patient Stated Goals  Decreased pain    Currently in Pain?  No/denies                       [facility] Adult PT Treatment/Exercise - 10/24/18 1123      Lumbar Exercises: Seated   Other Seated Lumbar Exercises  Thoracic excursion: flexion/extension, sidebending left/right, rotation left/right x 5 each      Lumbar Exercises: Supine   Straight Leg Raise  10 reps;2 seconds      Lumbar Exercises: Sidelying   Clam  10 reps;3 seconds      Knee/Hip Exercises:  Standing   Other Standing Knee Exercises  Standing marching x 20 alternating legs with bilateral UE support. Sidestepping inside parallel bars with bilateral UE support x3 roundtrips    Other Standing Knee Exercises  Sit to stand from elevated mat x 5 with cues for form      Knee/Hip Exercises: Supine   Bridges  Strengthening;Both;1 set;10 reps               PT Short Term Goals - 10/10/18 1121      PT SHORT TERM GOAL #1   Title  Patient will report understanding and regular compliance with HEP in order to improve strength, balance, flexibility and lumbar mobility.     Time  2    Period  Weeks    Status  On-going    Target Date  10/22/18      PT SHORT TERM GOAL #2   Title  Patient will report ability to stand for at least 10 minutes in order to do dishes without having to sit.     Time  2    Period  Weeks    Status  On-going    Target  Date  10/22/18        PT Long Term Goals - 10/10/18 1122      PT LONG TERM GOAL #1   Title  Patient will report that her back pain has not exceeded a 3/10 over the course of a 1 week period indicating improved tolerance to daily activities.     Time  4    Period  Weeks    Status  On-going      PT LONG TERM GOAL #2   Title  Patient will demonstrate improvement of at least 1/2 MMT strength grade in all deficient muscle groups in order to improve ability to ambulate and perform other functional activities.     Time  4    Period  Weeks    Status  On-going      PT LONG TERM GOAL #3   Title  Patient will demonstrate an improvement of at least 10% on FOTO indicating improved percieved functional mobility.     Time  4    Period  Weeks    Status  On-going            Plan - 10/24/18 1202    Clinical Impression Statement  Continued with established plan of care. Resumed sit to stands this session as patient's left knee pain was not as significant this session. Bridges and clams added to patient's HEP. This session added thoracic excursions to improve upper trunk mobility, and also added standing marching to improve balance and hip flexor strength. Patient reported no increase in pain at end of session.     Rehab Potential  Fair    Clinical Impairments Affecting Rehab Potential  Positive: Motivated; Negative: comorbidities    PT Frequency  2x / week    PT Duration  4 weeks    PT Treatment/Interventions  ADLs/Self Care Home Management;DME Instruction;Gait training;Stair training;Functional mobility training;Therapeutic activities;Therapeutic exercise;Balance training;Neuromuscular re-education;Patient/family education;Orthotic Fit/Training;Manual techniques;Passive range of motion;Dry needling;Energy conservation;Taping    PT Next Visit Plan  Focus on lumbar ROM (H/O right THA) and core strengthening and progress to functional lower extremity strengthening. Continue functional strengthening  and standing lower extremity strengthening.     PT Home Exercise Plan  10/08/18: Hamstring stretch 3x30'' seated 1x/day       Patient will benefit from skilled therapeutic intervention in order  to improve the following deficits and impairments:  Abnormal gait, Decreased balance, Decreased mobility, Difficulty walking, Decreased activity tolerance, Decreased strength, Increased fascial restricitons, Impaired flexibility, Postural dysfunction, Pain  Visit Diagnosis: Chronic low back pain, unspecified back pain laterality, unspecified whether sciatica present  Other symptoms and signs involving the musculoskeletal system     Problem List Patient Active Problem List   Diagnosis Date Noted  . Choledocholithiasis   . Common bile duct dilatation 07/14/2017  . Essential hypertension 07/14/2017  . Elevated LFTs 07/14/2017  . CKD (chronic kidney disease) stage 3, GFR 30-59 ml/min (HCC) 07/14/2017  . CAP (community acquired pneumonia) 06/30/2017  . Hypercholesteremia 06/30/2017  . Glaucoma 06/30/2017  . Anxiety 06/30/2017  . Acid reflux 06/30/2017  . Osteoarthritis 07/21/2011  . Altered mental status 07/21/2011  . COPD (chronic obstructive pulmonary disease) ([place]) 07/21/2011  . UTI (lower urinary tract infection) 07/21/2011  . TOTAL HIP FOLLOW-UP 04/14/2008  . HIP PAIN 03/17/2008  . SCIATICA 03/17/2008  . CUBITAL TUNNEL SYNDROME 02/10/2008  . KNEE, ARTHRITIS, DEGEN./OSTEO 02/10/2008  . KNEE PAIN 02/10/2008  . NECK PAIN 12/24/2007  . SHOULDER PAIN 11/17/2007  . RUPTURE ROTATOR CUFF 11/17/2007  . Type 2 diabetes mellitus ([place]) 05/13/2007   Clarene Critchley PT, DPT 12:03 PM, 10/24/18 [place] [address] [place], [place], [address] Phone: [phone]   Fax:  [phone]  Name: [name] MRN: [id number] Date of Birth: 11/08/1928

## 2018-10-28 ENCOUNTER — Encounter (HOSPITAL_COMMUNITY): Payer: Self-pay | Admitting: Physical Therapy

## 2018-10-28 ENCOUNTER — Ambulatory Visit (HOSPITAL_COMMUNITY): Payer: Medicare Other | Attending: Orthopedic Surgery | Admitting: Physical Therapy

## 2018-10-28 DIAGNOSIS — M545 Low back pain, unspecified: Secondary | ICD-10-CM

## 2018-10-28 DIAGNOSIS — G8929 Other chronic pain: Secondary | ICD-10-CM | POA: Diagnosis not present

## 2018-10-28 DIAGNOSIS — R29898 Other symptoms and signs involving the musculoskeletal system: Secondary | ICD-10-CM

## 2018-10-28 NOTE — Therapy (Signed)
[place], [place], [address] Phone: [phone]   Fax:  [phone]  Physical Therapy Treatment  Patient Details  Name: [name] MRN: [id number] Date of Birth: 12-27-1928 Referring Provider (PT): Carole Civil, MD   Encounter Date: 10/28/2018  PT End of Session - 10/28/18 1044    Visit Number  7    Number of Visits  8    Date for PT Re-Evaluation  11/05/18    Authorization Type  Medicare primary; Medicaid secondary    Authorization Time Period  10/08/18 - 11/05/18    Authorization - Visit Number  7    Authorization - Number of Visits  10    PT Start Time  6387    PT Stop Time  1113    PT Time Calculation (min)  38 min    Equipment Utilized During Treatment  Gait belt    Activity Tolerance  Patient tolerated treatment well    Behavior During Therapy  WFL for tasks assessed/performed       Past Medical History:  Diagnosis Date  . Acid reflux   . Anxiety   . CKD (chronic kidney disease)   . COPD (chronic obstructive pulmonary disease) ([place])   . Depressed   . Diabetes mellitus   . Glaucoma   . Hypercholesteremia   . Hypertension   . Legally blind in right eye, as defined in [place]   . Panic attacks   . Type 2 diabetes mellitus ([place])     Past Surgical History:  Procedure Laterality Date  . APPENDECTOMY    . BALLOON DILATION  07/16/2017   Procedure: BALLOON DILATION;  Surgeon: Danie Binder, MD;  [facility];  Service: Endoscopy;;  . cataracts    . CHOLECYSTECTOMY    . ERCP N/A 07/16/2017   biliary papillary stenosis, filling defect c/w a stone and sludge, entire biliary tree dilated. completed biliary sphincterotomy and balloon extraction, stone removal  . ESOPHAGOGASTRODUODENOSCOPY  2006   Dr. Gala Romney: normal esophagus s/p empiric dilataton, small hiatal hernia  . HIP SURGERY     rod in leg as well  . MASS EXCISION Left 09/26/2017   Procedure: EXCISION MUCOID CYST WITH DISTAL  INTERPHALANGEAL JOINT ARTHROTOMY OF LEFT MIDDLE FINGER;  Surgeon: Leanora Cover, MD;  Location: [place];  Service: Orthopedics;  Laterality: Left;  . SPHINCTEROTOMY  07/16/2017   Procedure: SPHINCTEROTOMY;  Surgeon: Danie Binder, MD;  [facility];  Service: Endoscopy;;  . TUBAL LIGATION      There were no vitals filed for this visit.  Subjective Assessment - 10/28/18 1042    Subjective  Patient denied pain today. She stated she feels she is doing better as she is able to wash her back now, when she wasn't able to previously.     Pertinent History  Right total hip arthroplasty 2006    Patient Stated Goals  Decreased pain    Currently in Pain?  No/denies                       [facility] Adult PT Treatment/Exercise - 10/28/18 0001      Lumbar Exercises: Seated   Other Seated Lumbar Exercises  Thoracic excursion: flexion/extension, sidebending left/right, rotation left/right x 5 each    Other Seated Lumbar Exercises  Abdominal set with marching 20x 2'' holds. Sitting tall;  Wback 10x; RTB rows 10x 2  Lumbar Exercises: Supine   Straight Leg Raise  10 reps;2 seconds      Lumbar Exercises: Sidelying   Clam  10 reps;3 seconds      Knee/Hip Exercises: Standing   Functional Squat  1 set;10 reps    Functional Squat Limitations  mini-squat with bilateral upper extremity assistance (modified from sit to stands due to knee pain)    Other Standing Knee Exercises  Hip extension/abduction x 10 each LE. Standing marching x 20 alternating legs with bilateral UE support. Sidestepping inside parallel bars with bilateral UE support x3 roundtrips    Other Standing Knee Exercises  Sit to stand from elevated mat x 5 with cues for form      Knee/Hip Exercises: Supine   Bridges  Strengthening;Both;1 set;10 reps             PT Education - 10/28/18 1044    Education Details  Educated on purpose and technique of interventions throughout session.      Person(s) Educated  Patient    Methods  Explanation;Demonstration;Verbal cues;Tactile cues    Comprehension  Verbalized understanding       PT Short Term Goals - 10/10/18 1121      PT SHORT TERM GOAL #1   Title  Patient will report understanding and regular compliance with HEP in order to improve strength, balance, flexibility and lumbar mobility.     Time  2    Period  Weeks    Status  On-going    Target Date  10/22/18      PT SHORT TERM GOAL #2   Title  Patient will report ability to stand for at least 10 minutes in order to do dishes without having to sit.     Time  2    Period  Weeks    Status  On-going    Target Date  10/22/18        PT Long Term Goals - 10/10/18 1122      PT LONG TERM GOAL #1   Title  Patient will report that her back pain has not exceeded a 3/10 over the course of a 1 week period indicating improved tolerance to daily activities.     Time  4    Period  Weeks    Status  On-going      PT LONG TERM GOAL #2   Title  Patient will demonstrate improvement of at least 1/2 MMT strength grade in all deficient muscle groups in order to improve ability to ambulate and perform other functional activities.     Time  4    Period  Weeks    Status  On-going      PT LONG TERM GOAL #3   Title  Patient will demonstrate an improvement of at least 10% on FOTO indicating improved percieved functional mobility.     Time  4    Period  Weeks    Status  On-going            Plan - 10/28/18 1100    Clinical Impression Statement  Continued with established plan of care. Patient with reported improved functional mobility. This session added standing hip abduction and hip extension strengthening. Patient continued to require cueing and demonstration for exercises throughout session. Plan to re-assess patient next session.     Rehab Potential  Fair    Clinical Impairments Affecting Rehab Potential  Positive: Motivated; Negative: comorbidities    PT Frequency  2x / week  PT Duration  4 weeks    PT Treatment/Interventions  ADLs/Self Care Home Management;DME Instruction;Gait training;Stair training;Functional mobility training;Therapeutic activities;Therapeutic exercise;Balance training;Neuromuscular re-education;Patient/family education;Orthotic Fit/Training;Manual techniques;Passive range of motion;Dry needling;Energy conservation;Taping    PT Next Visit Plan  Focus on lumbar ROM (H/O right THA) and core strengthening and progress to functional lower extremity strengthening. Continue functional strengthening and standing lower extremity strengthening.     PT Home Exercise Plan  10/08/18: Hamstring stretch 3x30'' seated 1x/day; Bridges and clams 1x10 each 1x/day       Patient will benefit from skilled therapeutic intervention in order to improve the following deficits and impairments:  Abnormal gait, Decreased balance, Decreased mobility, Difficulty walking, Decreased activity tolerance, Decreased strength, Increased fascial restricitons, Impaired flexibility, Postural dysfunction, Pain  Visit Diagnosis: Chronic low back pain, unspecified back pain laterality, unspecified whether sciatica present  Other symptoms and signs involving the musculoskeletal system     Problem List Patient Active Problem List   Diagnosis Date Noted  . Choledocholithiasis   . Common bile duct dilatation 07/14/2017  . Essential hypertension 07/14/2017  . Elevated LFTs 07/14/2017  . CKD (chronic kidney disease) stage 3, GFR 30-59 ml/min (HCC) 07/14/2017  . CAP (community acquired pneumonia) 06/30/2017  . Hypercholesteremia 06/30/2017  . Glaucoma 06/30/2017  . Anxiety 06/30/2017  . Acid reflux 06/30/2017  . Osteoarthritis 07/21/2011  . Altered mental status 07/21/2011  . COPD (chronic obstructive pulmonary disease) ([place]) 07/21/2011  . UTI (lower urinary tract infection) 07/21/2011  . TOTAL HIP FOLLOW-UP 04/14/2008  . HIP PAIN 03/17/2008  . SCIATICA 03/17/2008  . CUBITAL  TUNNEL SYNDROME 02/10/2008  . KNEE, ARTHRITIS, DEGEN./OSTEO 02/10/2008  . KNEE PAIN 02/10/2008  . NECK PAIN 12/24/2007  . SHOULDER PAIN 11/17/2007  . RUPTURE ROTATOR CUFF 11/17/2007  . Type 2 diabetes mellitus ([place]) 05/13/2007   Clarene Critchley PT, DPT 11:16 AM, 10/28/18 [place], [place], [address] Phone: [phone]   Fax:  [phone]  Name: MARGARETANN ABATE MRN: [id number] Date of Birth: 1929-01-06

## 2018-10-30 ENCOUNTER — Ambulatory Visit (HOSPITAL_COMMUNITY): Payer: Medicare Other | Admitting: Physical Therapy

## 2018-10-30 ENCOUNTER — Encounter (HOSPITAL_COMMUNITY): Payer: Self-pay | Admitting: Physical Therapy

## 2018-10-30 DIAGNOSIS — G8929 Other chronic pain: Secondary | ICD-10-CM

## 2018-10-30 DIAGNOSIS — M545 Low back pain: Principal | ICD-10-CM

## 2018-10-30 DIAGNOSIS — R29898 Other symptoms and signs involving the musculoskeletal system: Secondary | ICD-10-CM | POA: Diagnosis not present

## 2018-10-30 NOTE — Patient Instructions (Signed)
(  Home) Squat: (Assist)    Using supports, arms close to body, squat by dropping hips back as if sitting in a chair. With countertop for support.  Repeat __10__ times per set. Do _1___ sets per session. Do __7__ sessions per week.  Copyright  VHI. All rights reserved.

## 2018-10-30 NOTE — Therapy (Signed)
[facility] [address] [place], [place], [address] Phone: [phone]   Fax:  [phone]  Physical Therapy Treatment / Progress Note / Discharge Summary  Patient Details  Name: [name] MRN: [id number] Date of Birth: 1929/01/26 Referring Provider (PT): Carole Civil, MD   Encounter Date: 10/30/2018   Progress Note Reporting Period 10/08/18 to 10/30/18  See note below for Objective Data and Assessment of Progress/Goals.    PHYSICAL THERAPY DISCHARGE SUMMARY  Visits from Start of Care: 8  Current functional level related to goals / functional outcomes: See below   Remaining deficits: See below   Education / Equipment: Educated on HEP and falls prevention tips Plan: Patient agrees to discharge.  Patient goals were met. Patient is being discharged due to meeting the stated rehab goals.  ?????           PT End of Session - 10/30/18 1120    Visit Number  8    Number of Visits  8    Date for PT Re-Evaluation  11/05/18    Authorization Type  Medicare primary; Medicaid secondary    Authorization Time Period  10/08/18 - 11/05/18    Authorization - Visit Number  8    Authorization - Number of Visits  10    PT Start Time  1115    PT Stop Time  1140   Some time unbilled for re-assessment   PT Time Calculation (min)  25 min    Equipment Utilized During Treatment  Gait belt    Activity Tolerance  Patient tolerated treatment well    Behavior During Therapy  WFL for tasks assessed/performed       Past Medical History:  Diagnosis Date  . Acid reflux   . Anxiety   . CKD (chronic kidney disease)   . COPD (chronic obstructive pulmonary disease) ([place])   . Depressed   . Diabetes mellitus   . Glaucoma   . Hypercholesteremia   . Hypertension   . Legally blind in right eye, as defined in [place]   . Panic attacks   . Type 2 diabetes mellitus ([place])     Past Surgical History:  Procedure Laterality Date  . APPENDECTOMY     . BALLOON DILATION  07/16/2017   Procedure: BALLOON DILATION;  Surgeon: Danie Binder, MD;  [facility];  Service: Endoscopy;;  . cataracts    . CHOLECYSTECTOMY    . ERCP N/A 07/16/2017   biliary papillary stenosis, filling defect c/w a stone and sludge, entire biliary tree dilated. completed biliary sphincterotomy and balloon extraction, stone removal  . ESOPHAGOGASTRODUODENOSCOPY  2006   Dr. Gala Romney: normal esophagus s/p empiric dilataton, small hiatal hernia  . HIP SURGERY     rod in leg as well  . MASS EXCISION Left 09/26/2017   Procedure: EXCISION MUCOID CYST WITH DISTAL INTERPHALANGEAL JOINT ARTHROTOMY OF LEFT MIDDLE FINGER;  Surgeon: Leanora Cover, MD;  Location: [facility];  Service: Orthopedics;  Laterality: Left;  . SPHINCTEROTOMY  07/16/2017   Procedure: SPHINCTEROTOMY;  Surgeon: Danie Binder, MD;  [facility];  Service: Endoscopy;;  . TUBAL LIGATION      There were no vitals filed for this visit.  Subjective Assessment - 10/30/18 1119    Subjective  Patient reported she is pleased with her progress in therapy and feels ready to be done.     Pertinent History  Right total hip arthroplasty 2006  How long can you sit comfortably?  30 minutes    How long can you stand comfortably?  10 minutes with walker    How long can you walk comfortably?  10 minutes with walker    Patient Stated Goals  Decreased pain    Currently in Pain?  No/denies         [facility] PT Assessment - 10/30/18 0001      Assessment   Medical Diagnosis  Back pain with right-sided radiculopathy; H/O total hip arthroplasty, right      Observation/Other Assessments   Focus on Therapeutic Outcomes (FOTO)   35% limited   was 46% limited     Strength   Right Hip Flexion  4-/5   was 3+   Right Hip Extension  4-/5   was 3+   Right Hip ABduction  4-/5   was 3+   Left Hip Flexion  4-/5   was 3+   Left Hip Extension  4-/5   was 3+   Left Hip ABduction  4-/5    was 3+   Right Knee Flexion  4+/5   was 4-   Right Knee Extension  4+/5   was 4-   Left Knee Flexion  4+/5   was 4-   Left Knee Extension  4+/5   was 4-   Right Ankle Dorsiflexion  5/5   was 4+   Left Ankle Dorsiflexion  5/5   was 4+                          PT Education - 10/30/18 1142    Education Details  Educated patient and patient's family on examination findings, about updated HEP, and about reducing falls risk.     Person(s) Educated  Patient;Caregiver(s)    Methods  Explanation;Verbal cues;Handout    Comprehension  Verbalized understanding       PT Short Term Goals - 10/30/18 1143      PT SHORT TERM GOAL #1   Title  Patient will report understanding and regular compliance with HEP in order to improve strength, balance, flexibility and lumbar mobility.     Time  2    Period  Weeks    Status  Achieved    Target Date  10/22/18      PT SHORT TERM GOAL #2   Title  Patient will report ability to stand for at least 10 minutes in order to do dishes without having to sit.     Time  2    Period  Weeks    Status  Achieved    Target Date  10/22/18        PT Long Term Goals - 10/30/18 1144      PT LONG TERM GOAL #1   Title  Patient will report that her back pain has not exceeded a 3/10 over the course of a 1 week period indicating improved tolerance to daily activities.     Time  4    Period  Weeks    Status  Achieved      PT LONG TERM GOAL #2   Title  Patient will demonstrate improvement of at least 1/2 MMT strength grade in all deficient muscle groups in order to improve ability to ambulate and perform other functional activities.     Baseline  See MMT.     Time  4    Period  Weeks    Status  Achieved  PT LONG TERM GOAL #3   Title  Patient will demonstrate an improvement of at least 10% on FOTO indicating improved percieved functional mobility.     Baseline  See objective measures    Time  4    Period  Weeks    Status  Achieved             Plan - 10/30/18 1143    Clinical Impression Statement  This session performed a re-assessment of patient's progress towards goals, patient achieved all short term and long term goals. Patient reported that she is not having back pain any more. She does report some knee pain intermittently. Educated patient on an updated HEP as well as about fall prevention techniques. Patient is to be discharged at this time as she has met all goals and as patient is pleased with current functional ability in regards to her back. Educated patient to discuss with her physician if she is having continued knee pain about possible treatment options.   Rehab Potential  Fair    Clinical Impairments Affecting Rehab Potential  Positive: Motivated; Negative: comorbidities    PT Frequency  2x / week    PT Duration  4 weeks    PT Treatment/Interventions  ADLs/Self Care Home Management;DME Instruction;Gait training;Stair training;Functional mobility training;Therapeutic activities;Therapeutic exercise;Balance training;Neuromuscular re-education;Patient/family education;Orthotic Fit/Training;Manual techniques;Passive range of motion;Dry needling;Energy conservation;Taping    PT Next Visit Plan  Focus on lumbar ROM (H/O right THA) and core strengthening and progress to functional lower extremity strengthening. Continue functional strengthening and standing lower extremity strengthening.     PT Home Exercise Plan  10/08/18: Hamstring stretch 3x30'' seated 1x/day; Bridges and clams 1x10 each 1x/day; 10/30/18: Functional squats 1x10 at countertop 1x/day    Consulted and Agree with Plan of Care  Patient       Patient will benefit from skilled therapeutic intervention in order to improve the following deficits and impairments:  Abnormal gait, Decreased balance, Decreased mobility, Difficulty walking, Decreased activity tolerance, Decreased strength, Increased fascial restricitons, Impaired flexibility, Postural  dysfunction, Pain  Visit Diagnosis: Chronic low back pain, unspecified back pain laterality, unspecified whether sciatica present  Other symptoms and signs involving the musculoskeletal system     Problem List Patient Active Problem List   Diagnosis Date Noted  . Choledocholithiasis   . Common bile duct dilatation 07/14/2017  . Essential hypertension 07/14/2017  . Elevated LFTs 07/14/2017  . CKD (chronic kidney disease) stage 3, GFR 30-59 ml/min (HCC) 07/14/2017  . CAP (community acquired pneumonia) 06/30/2017  . Hypercholesteremia 06/30/2017  . Glaucoma 06/30/2017  . Anxiety 06/30/2017  . Acid reflux 06/30/2017  . Osteoarthritis 07/21/2011  . Altered mental status 07/21/2011  . COPD (chronic obstructive pulmonary disease) ([place]) 07/21/2011  . UTI (lower urinary tract infection) 07/21/2011  . TOTAL HIP FOLLOW-UP 04/14/2008  . HIP PAIN 03/17/2008  . SCIATICA 03/17/2008  . CUBITAL TUNNEL SYNDROME 02/10/2008  . KNEE, ARTHRITIS, DEGEN./OSTEO 02/10/2008  . KNEE PAIN 02/10/2008  . NECK PAIN 12/24/2007  . SHOULDER PAIN 11/17/2007  . RUPTURE ROTATOR CUFF 11/17/2007  . Type 2 diabetes mellitus ([place]) 05/13/2007   Clarene Critchley PT, DPT 11:51 AM, 10/30/18 [place], [place], [address] Phone: [phone]   Fax:  [phone]  Name: [name] MRN: [id number] Date of Birth: [birth year]

## 2018-11-16 ENCOUNTER — Telehealth: Payer: Self-pay | Admitting: Orthopedic Surgery

## 2018-11-16 NOTE — Telephone Encounter (Signed)
camcelled the appt  COVID19

## 2018-11-17 ENCOUNTER — Ambulatory Visit: Payer: Medicare Other | Admitting: Orthopedic Surgery

## 2019-01-17 IMAGING — CT CT ABD-PELV W/O CM
2 of 4 series · 16 of 46 positions shown, 18 images · non-contrast
Comparison: CT abdomen dated 03/30/2005.

CLINICAL DATA: Epigastric, periumbilical and right lower quadrant
pain for 2 days.

EXAM:
CT ABDOMEN AND PELVIS WITHOUT CONTRAST
TECHNIQUE: Multidetector CT imaging of the abdomen and pelvis was performed
following the standard protocol without IV contrast.

[Series 2: axial (person_name) (person_name) · axial · 0.87mm/px · z∈[-656,-261]mm · 13 of 87 slices shown, 15 images]
[im 4/87  soft-tissue]
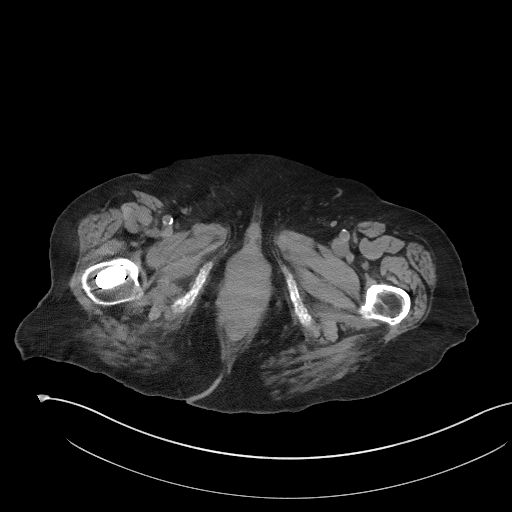
[im 4/87  bone]
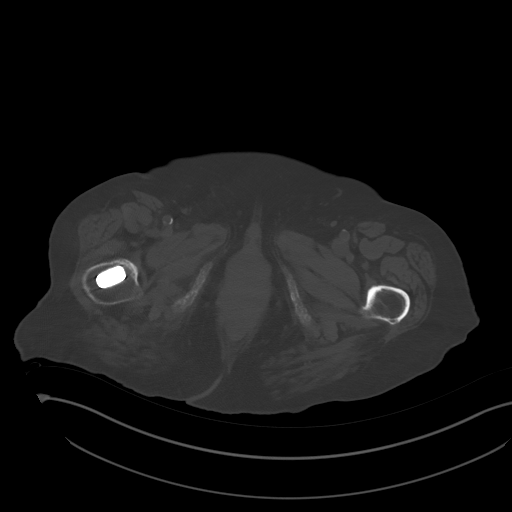
[im 12/87  soft-tissue]
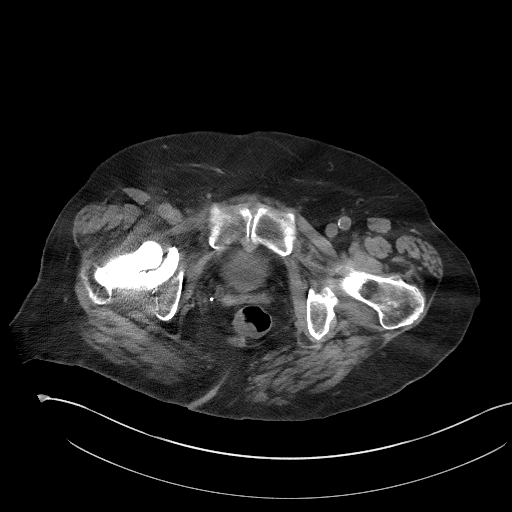
[im 19/87  soft-tissue]
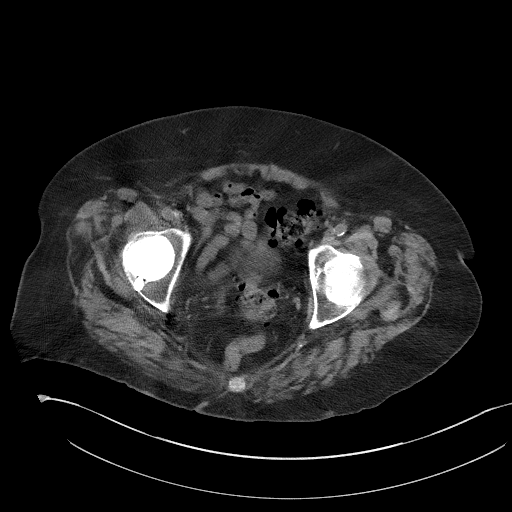
[im 23/87  soft-tissue]
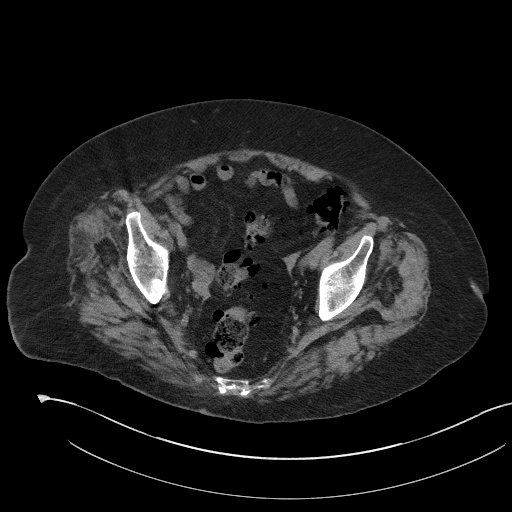
[im 30/87  soft-tissue]
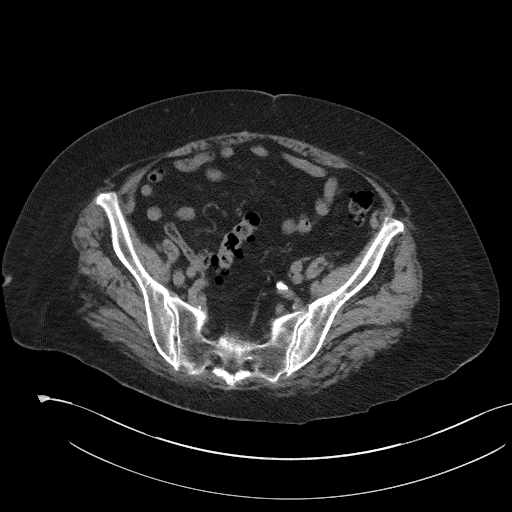
[im 38/87  soft-tissue]
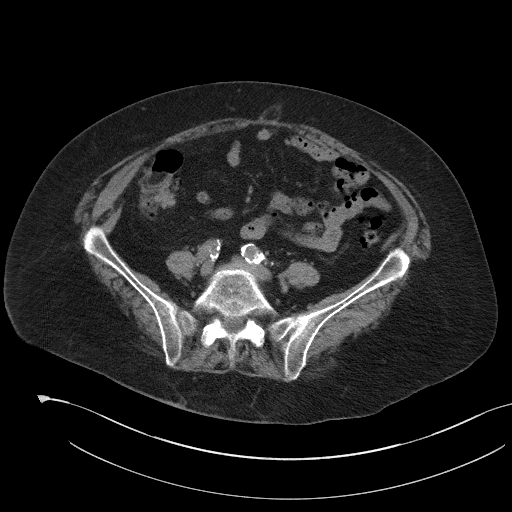
[im 45/87  soft-tissue]
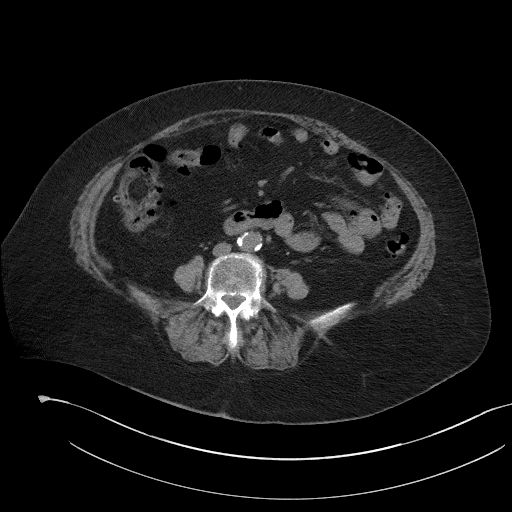
[im 49/87  soft-tissue]
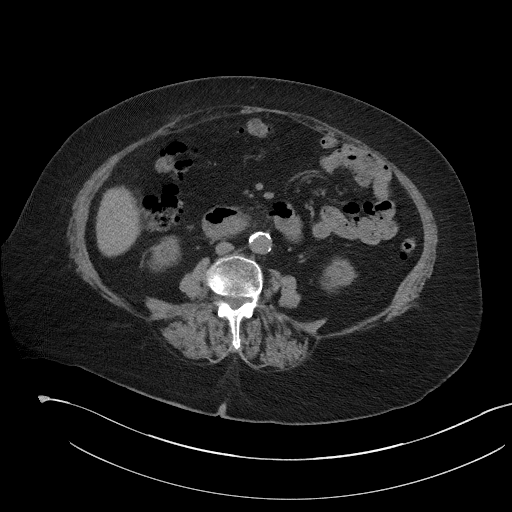
[im 57/87  soft-tissue]
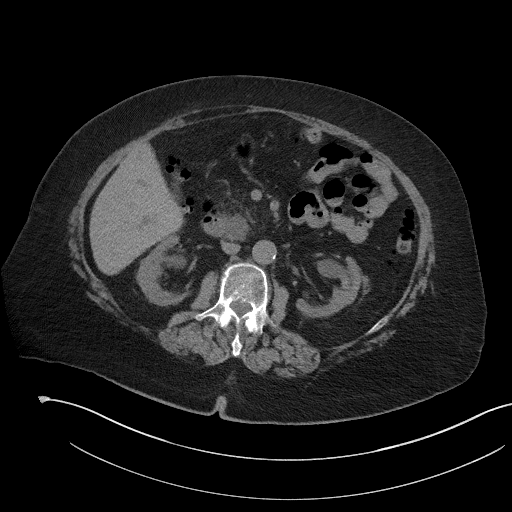
[im 57/87  bone]
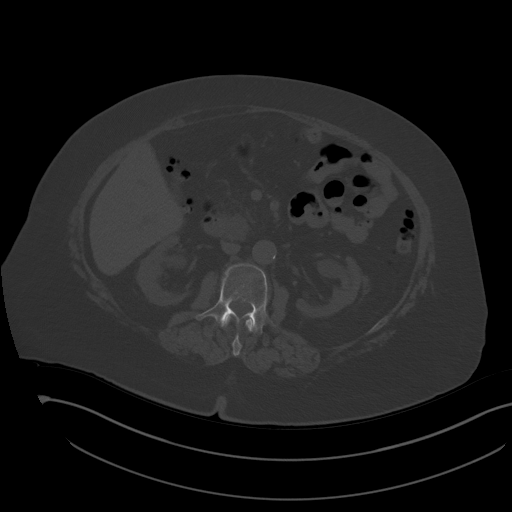
[im 64/87  soft-tissue]
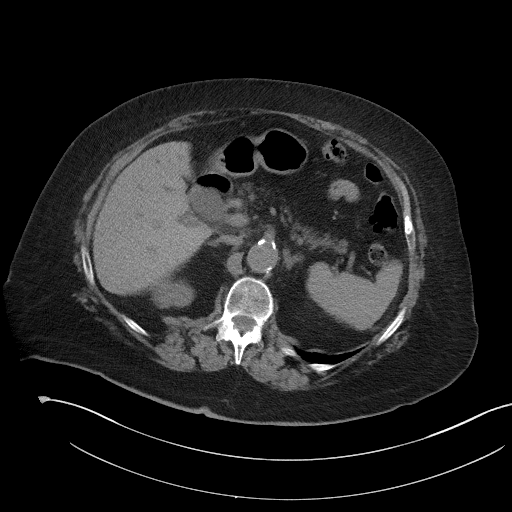
[im 68/87  soft-tissue]
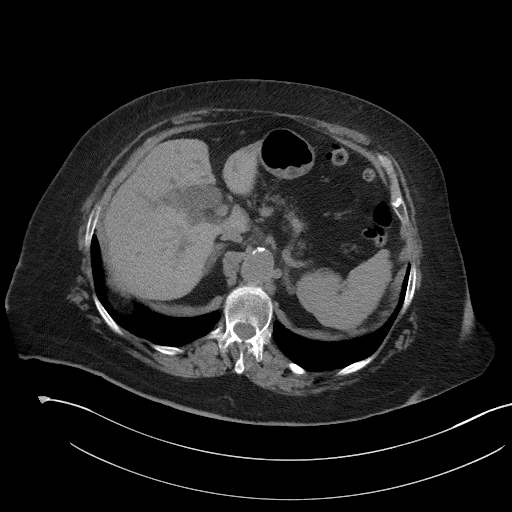
[im 75/87  soft-tissue]
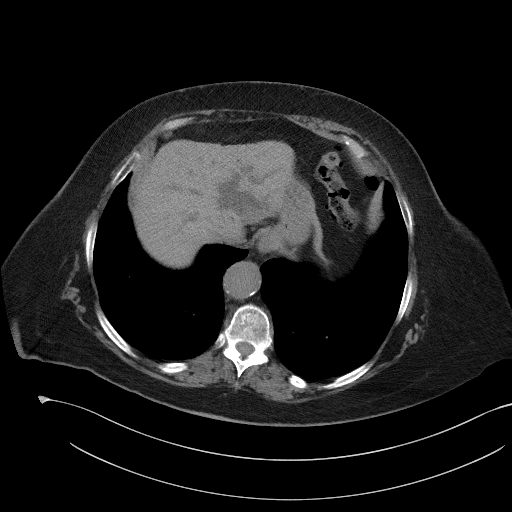
[im 83/87  soft-tissue]
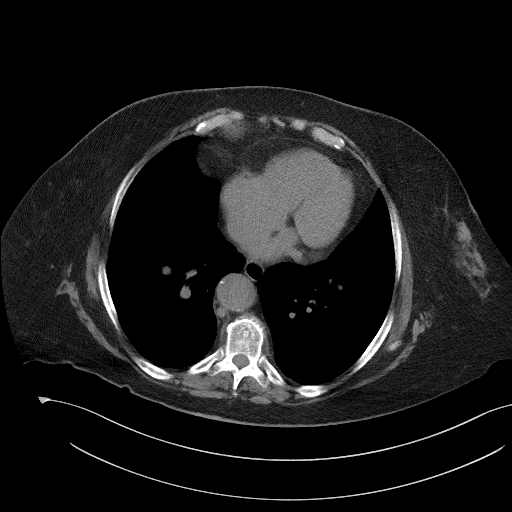

[Series 5: coronal st · coronal · 0.84mm/px · 3 of 98 slices shown]
[im 33/98  soft-tissue]
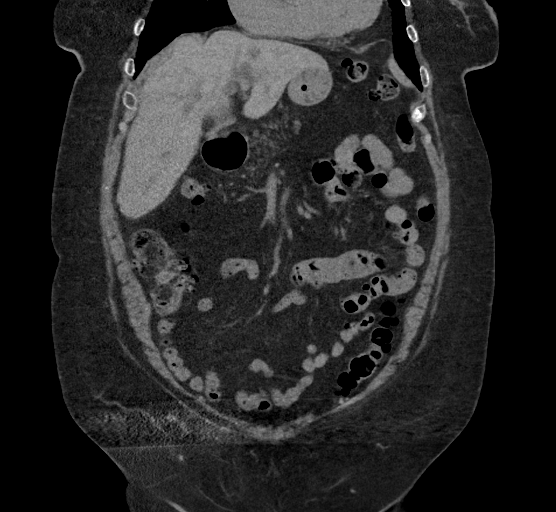
[im 44/98  soft-tissue]
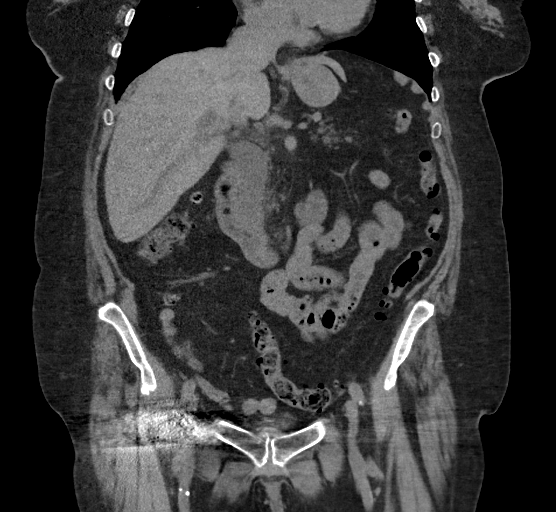
[im 54/98  soft-tissue]
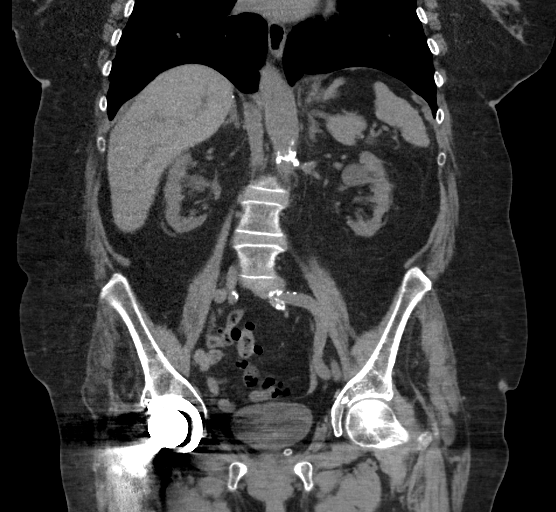

[16 of 46 positions shown; findings below may reference images not displayed]

FINDINGS: Lower chest: No acute findings. Mild focal scarring/atelectasis at
the right lung base.

Hepatobiliary: No focal liver abnormality is seen. There is marked
common bile duct dilatation, measuring up to approximately 2.5 cm.
Gallbladder is not seen, presumed cholecystectomy.

Pancreas: Fatty infiltration of the pancreas. Otherwise
unremarkable.

Spleen: Normal in size without focal abnormality.

Adrenals/Urinary Tract: Adrenal glands are unremarkable. Parapelvic
renal cysts bilaterally. No renal stone or hydronephrosis. No
ureteral or bladder calculi identified. Bladder is unremarkable,
partially decompressed.

Stomach/Bowel: Extensive diverticulosis throughout the colon but no
focal inflammatory change to suggest acute diverticulitis. Stomach
is unremarkable. No dilated large or small bowel loops. Appendix is
not seen but there are no inflammatory changes about the cecum to
suggest acute appendicitis.

Vascular/Lymphatic: Aortic atherosclerosis. No enlarged lymph nodes
seen.

Reproductive: Presumed hysterectomy. No adnexal mass or inflammatory
change.

Other: No free fluid or abscess collection. No free intraperitoneal
air.

Musculoskeletal: Mild degenerative change of the slightly scoliotic
thoracolumbar spine. No acute or suspicious osseous finding.
Superficial soft tissues are unremarkable.
IMPRESSION: 1. Marked common bile duct dilatation, measuring up to 2.5 cm
diameter. No obstructing stone identified. Recommend correlation
with liver function tests. Consider MRCP for further
characterization.
2. Extensive colonic diverticulosis without evidence of acute
diverticulitis.
3. Aortic atherosclerosis.
4. Remainder of the abdomen and pelvis is unremarkable, as detailed
above. No bowel obstruction or evidence of bowel wall inflammation.
No free fluid. No evidence of acute solid organ abnormality. No
renal or ureteral calculi.

## 2019-03-11 DIAGNOSIS — N390 Urinary tract infection, site not specified: Secondary | ICD-10-CM | POA: Diagnosis not present

## 2019-03-11 DIAGNOSIS — E1121 Type 2 diabetes mellitus with diabetic nephropathy: Secondary | ICD-10-CM | POA: Diagnosis not present

## 2019-03-11 DIAGNOSIS — N184 Chronic kidney disease, stage 4 (severe): Secondary | ICD-10-CM | POA: Diagnosis not present

## 2019-03-11 DIAGNOSIS — J449 Chronic obstructive pulmonary disease, unspecified: Secondary | ICD-10-CM | POA: Diagnosis not present

## 2019-03-17 DIAGNOSIS — E1165 Type 2 diabetes mellitus with hyperglycemia: Secondary | ICD-10-CM | POA: Diagnosis not present

## 2019-03-17 DIAGNOSIS — E785 Hyperlipidemia, unspecified: Secondary | ICD-10-CM | POA: Diagnosis not present

## 2019-03-17 DIAGNOSIS — M545 Low back pain: Secondary | ICD-10-CM | POA: Diagnosis not present

## 2019-03-17 DIAGNOSIS — J449 Chronic obstructive pulmonary disease, unspecified: Secondary | ICD-10-CM | POA: Diagnosis not present

## 2019-03-17 DIAGNOSIS — E119 Type 2 diabetes mellitus without complications: Secondary | ICD-10-CM | POA: Diagnosis not present

## 2019-03-17 DIAGNOSIS — N390 Urinary tract infection, site not specified: Secondary | ICD-10-CM | POA: Diagnosis not present

## 2019-03-30 ENCOUNTER — Other Ambulatory Visit: Payer: Self-pay

## 2019-10-13 ENCOUNTER — Ambulatory Visit (INDEPENDENT_AMBULATORY_CARE_PROVIDER_SITE_OTHER): Payer: Medicare Other

## 2019-10-13 ENCOUNTER — Other Ambulatory Visit: Payer: Self-pay

## 2019-10-13 ENCOUNTER — Ambulatory Visit
Admission: EM | Admit: 2019-10-13 | Discharge: 2019-10-13 | Disposition: A | Discharge status: Home / Self Care | Payer: Medicare Other | Attending: Emergency Medicine | Admitting: Emergency Medicine

## 2019-10-13 DIAGNOSIS — M79672 Pain in left foot: Secondary | ICD-10-CM | POA: Diagnosis not present

## 2019-10-13 DIAGNOSIS — L03119 Cellulitis of unspecified part of limb: Secondary | ICD-10-CM

## 2019-10-13 DIAGNOSIS — M19072 Primary osteoarthritis, left ankle and foot: Secondary | ICD-10-CM | POA: Diagnosis not present

## 2019-10-13 DIAGNOSIS — M7989 Other specified soft tissue disorders: Secondary | ICD-10-CM

## 2019-10-13 MED ORDER — TRAMADOL HCL 50 MG PO TABS: 50.0000 mg | ORAL_TABLET | Freq: Two times a day (BID) | ORAL | 0 refills | Status: AC | PRN

## 2019-10-13 MED ORDER — CEFTRIAXONE SODIUM 1 G IJ SOLR
1.0000 g | Freq: Once | INTRAMUSCULAR | Status: AC
  Administered 2019-10-13: 15:00:00 1 g via INTRAMUSCULAR

## 2019-10-13 MED ORDER — CLINDAMYCIN HCL 300 MG PO CAPS: 300.0000 mg | ORAL_CAPSULE | Freq: Four times a day (QID) | ORAL | 0 refills | Status: AC

## 2019-10-13 MED ORDER — ACETAMINOPHEN 325 MG PO TABS
650.0000 mg | ORAL_TABLET | Freq: Once | ORAL | Status: AC
  Administered 2019-10-13: 15:00:00 650 mg via ORAL

## 2019-10-13 NOTE — Discharge Instructions (Signed)
X-rays negative for fracture or dislocation We will treat for possible skin infection given physical exam findings 1 gram of rocephin given in office Prescribed clindamycin take as directed and to completion Rest, ice, and elevate Tylenol as needed for pain Go to the ED if you have any new or worsening symptoms such as increased pain, redness, swelling, discharge, high fever, night sweats, abdominal pain, etc..Marland Kitchen

## 2019-10-13 NOTE — ED Provider Notes (Signed)
[facility]   [id number] 10/13/19 Arrival Time: 7846  CC: LT foot pain  SUBJECTIVE: History from: patient. [name] is a 86 y.o. female complains of left foot pain x 2 days.  Denies a precipitating event or specific injury.  Localizes the pain to the top of foot.  Describes the pain as constant and burning in character.  Has tried a fluid pill with minimal relief.  Symptoms are made worse with bearing weight.  Denies similar symptoms in the past.  Complains of associated swelling, redness, warm to the touch, and decreased sensation.  Denies fever, chills, ecchymosis, weakness.  ROS: As per HPI.  All other pertinent ROS negative.     Past Medical History:  Diagnosis Date  . Acid reflux   . Anxiety   . CKD (chronic kidney disease)   . COPD (chronic obstructive pulmonary disease) ([place])   . Depressed   . Diabetes mellitus   . Glaucoma   . Hypercholesteremia   . Hypertension   . Legally blind in right eye, as defined in [place]   . Panic attacks   . Type 2 diabetes mellitus ([place])    Past Surgical History:  Procedure Laterality Date  . APPENDECTOMY    . BALLOON DILATION  07/16/2017   Procedure: BALLOON DILATION;  Surgeon: Danie Binder, MD;  [facility];  Service: Endoscopy;;  . cataracts    . CHOLECYSTECTOMY    . ERCP N/A 07/16/2017   biliary papillary stenosis, filling defect c/w a stone and sludge, entire biliary tree dilated. completed biliary sphincterotomy and balloon extraction, stone removal  . ESOPHAGOGASTRODUODENOSCOPY  2006   Dr. Gala Romney: normal esophagus s/p empiric dilataton, small hiatal hernia  . HIP SURGERY     rod in leg as well  . MASS EXCISION Left 09/26/2017   Procedure: EXCISION MUCOID CYST WITH DISTAL INTERPHALANGEAL JOINT ARTHROTOMY OF LEFT MIDDLE FINGER;  Surgeon: Leanora Cover, MD;  Location: [facility];  Service: Orthopedics;  Laterality: Left;  . SPHINCTEROTOMY  07/16/2017   Procedure: SPHINCTEROTOMY;  Surgeon:  Danie Binder, MD;  [facility];  Service: Endoscopy;;  . TUBAL LIGATION     Allergies  Allergen Reactions  . Hydromorphone Hcl Other (See Comments)    Patient goes out of right state of mind.    No current facility-administered medications on file prior to encounter.   Current Outpatient Medications on File Prior to Encounter  Medication Sig Dispense Refill  . acetaminophen (TYLENOL) 500 MG tablet Take 500 mg by mouth every 6 (six) hours as needed for mild pain or moderate pain.    Marland Kitchen amLODipine (NORVASC) 5 MG tablet Take 5 mg by mouth daily.      . busPIRone (BUSPAR) 10 MG tablet     . cholecalciferol (VITAMIN D) 1000 units tablet Take 1,000 Units daily by mouth.    . Difluprednate (DUREZOL OP) Apply to eye daily.     . furosemide (LASIX) 20 MG tablet Take 20 mg daily as needed by mouth for fluid.    Marland Kitchen glipiZIDE (GLUCOTROL) 5 MG tablet Take 5 mg 2 (two) times daily by mouth.     . methocarbamol (ROBAXIN) 500 MG tablet Take 1 tablet (500 mg total) by mouth every 8 (eight) hours as needed for muscle spasms. 30 tablet 0  . omeprazole (PRILOSEC) 20 MG capsule Take 20 mg by mouth daily.      . pravastatin (PRAVACHOL) 40 MG tablet Take 40 mg by mouth  daily.       Social History   Socioeconomic History  . Marital status: Divorced    Spouse name: Not on file  . Number of children: Not on file  . Years of education: Not on file  . Highest education level: Not on file  Occupational History  . Not on file  Tobacco Use  . Smoking status: Never Smoker  . Smokeless tobacco: Never Used  Substance and Sexual Activity  . Alcohol use: No  . Drug use: No  . Sexual activity: Never  Other Topics Concern  . Not on file  Social History Narrative  . Not on file   Social Determinants of Health   Financial Resource Strain:   . Difficulty of Paying Living Expenses: Not on file  Food Insecurity:   . Worried About Charity fundraiser in the Last Year: Not on file  . Ran Out of  Food in the Last Year: Not on file  Transportation Needs:   . Lack of Transportation (Medical): Not on file  . Lack of Transportation (Non-Medical): Not on file  Physical Activity:   . Days of Exercise per Week: Not on file  . Minutes of Exercise per Session: Not on file  Stress:   . Feeling of Stress : Not on file  Social Connections:   . Frequency of Communication with Friends and Family: Not on file  . Frequency of Social Gatherings with Friends and Family: Not on file  . Attends Religious Services: Not on file  . Active Member of Clubs or Organizations: Not on file  . Attends Archivist Meetings: Not on file  . Marital Status: Not on file  Intimate Partner Violence:   . Fear of Current or Ex-Partner: Not on file  . Emotionally Abused: Not on file  . Physically Abused: Not on file  . Sexually Abused: Not on file   Family History  Problem Relation Age of Onset  . Blindness Father   . Bone cancer Father   . Brain cancer Sister   . Alzheimer's disease Brother   . Colon cancer Neg Hx     OBJECTIVE:  Vitals:   10/13/19 1350  BP: (!) 158/103  Pulse: (!) 101  Resp: 18  Temp: 98 F (36.7 C)  SpO2: 96%    General appearance: ALERT; in no acute distress.  Head: NCAT Lungs: Normal respiratory effort CV: Dorsalis pedis, and posterior tibialis pulses 2+ and intact to LT foot; Cap refill < 3 seconds Musculoskeletal: LT foot Inspection: diffuse swelling about the ankle and proximal foot; mild overlying erythema, warm to the touch, no obvious open wounds or skin tears Palpation: Diffusely TTP over lateral ankle, proximal dorsal foot, proximal metatarsals especially over 5th MT, and plantar aspect of proximal foot ROM: LROM about the ankle Strength: deferred given discomfort Skin: warm and dry Neurologic: Sitting in wheelchair; Sensation decreased over LT foot Psychological: alert and cooperative; normal mood and affect  DIAGNOSTIC STUDIES:  DG Foot Complete  Left  Result Date: 10/13/2019 CLINICAL DATA:  Foot pain.  Difficulty bearing weight. EXAM: LEFT FOOT - COMPLETE 3+ VIEW COMPARISON:  02/11/2008. FINDINGS: Soft tissue swelling cannot be excluded. Diffuse severe osteopenia. Diffuse degenerative change. No acute bony abnormality identified. No evidence of fracture or dislocation. IMPRESSION: 1. Soft tissue swelling cannot be excluded. No acute bony or joint abnormality identified. No evidence of fracture or dislocation. 2.  Diffuse severe osteopenia.  Diffuse degenerative change. Electronically Signed   By: Marcello Moores  Register   On: 10/13/2019 14:19    X-rays negative for bony abnormalities including fracture, or dislocation.    I have reviewed the x-rays myself and the radiologist interpretation. I am in agreement with the radiologist interpretation.     ASSESSMENT & PLAN:  1. Left foot pain   2. Swelling of left foot   3. Cellulitis of foot     Meds ordered this encounter  Medications  . clindamycin (CLEOCIN) 300 MG capsule    Sig: Take 1 capsule (300 mg total) by mouth 4 (four) times daily for 10 days.    Dispense:  40 capsule    Refill:  0    Order Specific Question:   Supervising Provider    Answer:   Raylene Everts [[id number]]  . cefTRIAXone (ROCEPHIN) injection 1 g  . acetaminophen (TYLENOL) tablet 650 mg  . traMADol (ULTRAM) 50 MG tablet    Sig: Take 1 tablet (50 mg total) by mouth every 12 (twelve) hours as needed for severe pain.    Dispense:  15 tablet    Refill:  0    Order Specific Question:   Supervising Provider    Answer:   Raylene Everts [[id number]]   X-rays negative for fracture or dislocation We will treat for possible skin infection given physical exam findings 1 gram of rocephin given in office Prescribed clindamycin take as directed and to completion Rest, ice, and elevate Tylenol as needed for pain Tramadol sent to pharmacy for severe break-through pain Go to the ED if you have any new or worsening  symptoms such as increased pain, redness, swelling, discharge, high fever, night sweats, abdominal pain, etc...   Reviewed expectations re: course of current medical issues. Questions answered. Outlined signs and symptoms indicating need for more acute intervention. Patient verbalized understanding. After Visit Summary given.    Lestine Box, PA-C 10/13/19 1444

## 2019-10-13 NOTE — ED Triage Notes (Addendum)
Pt presents with c/o left  Foot pain that developed about 2 days ago, pt states that she has a burning pain on top of foot that radiates to bottom of foot and also radiates to shin , pt has difficulty bearing weight pt denies injury .

## 2019-11-19 DIAGNOSIS — F419 Anxiety disorder, unspecified: Secondary | ICD-10-CM | POA: Diagnosis not present

## 2019-11-19 DIAGNOSIS — I129 Hypertensive chronic kidney disease with stage 1 through stage 4 chronic kidney disease, or unspecified chronic kidney disease: Secondary | ICD-10-CM | POA: Diagnosis not present

## 2019-11-19 DIAGNOSIS — R600 Localized edema: Secondary | ICD-10-CM | POA: Diagnosis not present

## 2019-11-19 DIAGNOSIS — Z0189 Encounter for other specified special examinations: Secondary | ICD-10-CM | POA: Diagnosis not present

## 2019-11-19 DIAGNOSIS — Z136 Encounter for screening for cardiovascular disorders: Secondary | ICD-10-CM | POA: Diagnosis not present

## 2019-11-19 DIAGNOSIS — E1165 Type 2 diabetes mellitus with hyperglycemia: Secondary | ICD-10-CM | POA: Diagnosis not present

## 2019-11-19 DIAGNOSIS — E785 Hyperlipidemia, unspecified: Secondary | ICD-10-CM | POA: Diagnosis not present

## 2019-11-19 DIAGNOSIS — K219 Gastro-esophageal reflux disease without esophagitis: Secondary | ICD-10-CM | POA: Diagnosis not present

## 2019-11-19 DIAGNOSIS — G8929 Other chronic pain: Secondary | ICD-10-CM | POA: Diagnosis not present

## 2019-11-19 DIAGNOSIS — I4891 Unspecified atrial fibrillation: Secondary | ICD-10-CM | POA: Diagnosis not present

## 2019-11-26 DIAGNOSIS — E039 Hypothyroidism, unspecified: Secondary | ICD-10-CM | POA: Diagnosis not present

## 2019-11-26 DIAGNOSIS — E1165 Type 2 diabetes mellitus with hyperglycemia: Secondary | ICD-10-CM | POA: Diagnosis not present

## 2019-11-26 DIAGNOSIS — Z0000 Encounter for general adult medical examination without abnormal findings: Secondary | ICD-10-CM | POA: Diagnosis not present

## 2019-11-26 DIAGNOSIS — I129 Hypertensive chronic kidney disease with stage 1 through stage 4 chronic kidney disease, or unspecified chronic kidney disease: Secondary | ICD-10-CM | POA: Diagnosis not present

## 2019-11-26 DIAGNOSIS — E785 Hyperlipidemia, unspecified: Secondary | ICD-10-CM | POA: Diagnosis not present

## 2019-12-01 DIAGNOSIS — I129 Hypertensive chronic kidney disease with stage 1 through stage 4 chronic kidney disease, or unspecified chronic kidney disease: Secondary | ICD-10-CM | POA: Diagnosis not present

## 2019-12-01 DIAGNOSIS — N184 Chronic kidney disease, stage 4 (severe): Secondary | ICD-10-CM | POA: Diagnosis not present

## 2019-12-01 DIAGNOSIS — E1165 Type 2 diabetes mellitus with hyperglycemia: Secondary | ICD-10-CM | POA: Diagnosis not present

## 2019-12-01 DIAGNOSIS — F419 Anxiety disorder, unspecified: Secondary | ICD-10-CM | POA: Diagnosis not present

## 2019-12-01 DIAGNOSIS — R600 Localized edema: Secondary | ICD-10-CM | POA: Diagnosis not present

## 2019-12-01 DIAGNOSIS — E785 Hyperlipidemia, unspecified: Secondary | ICD-10-CM | POA: Diagnosis not present

## 2019-12-01 DIAGNOSIS — G8929 Other chronic pain: Secondary | ICD-10-CM | POA: Diagnosis not present

## 2019-12-01 DIAGNOSIS — K219 Gastro-esophageal reflux disease without esophagitis: Secondary | ICD-10-CM | POA: Diagnosis not present

## 2019-12-01 DIAGNOSIS — I4891 Unspecified atrial fibrillation: Secondary | ICD-10-CM | POA: Diagnosis not present

## 2019-12-08 DIAGNOSIS — N184 Chronic kidney disease, stage 4 (severe): Secondary | ICD-10-CM | POA: Diagnosis not present

## 2019-12-08 DIAGNOSIS — E785 Hyperlipidemia, unspecified: Secondary | ICD-10-CM | POA: Diagnosis not present

## 2019-12-08 DIAGNOSIS — R600 Localized edema: Secondary | ICD-10-CM | POA: Diagnosis not present

## 2019-12-08 DIAGNOSIS — K219 Gastro-esophageal reflux disease without esophagitis: Secondary | ICD-10-CM | POA: Diagnosis not present

## 2019-12-08 DIAGNOSIS — I129 Hypertensive chronic kidney disease with stage 1 through stage 4 chronic kidney disease, or unspecified chronic kidney disease: Secondary | ICD-10-CM | POA: Diagnosis not present

## 2019-12-08 DIAGNOSIS — E1165 Type 2 diabetes mellitus with hyperglycemia: Secondary | ICD-10-CM | POA: Diagnosis not present

## 2019-12-08 DIAGNOSIS — I4891 Unspecified atrial fibrillation: Secondary | ICD-10-CM | POA: Diagnosis not present

## 2019-12-08 DIAGNOSIS — F419 Anxiety disorder, unspecified: Secondary | ICD-10-CM | POA: Diagnosis not present

## 2019-12-08 DIAGNOSIS — G8929 Other chronic pain: Secondary | ICD-10-CM | POA: Diagnosis not present

## 2019-12-11 ENCOUNTER — Encounter: Payer: Self-pay | Admitting: *Deleted

## 2019-12-11 ENCOUNTER — Encounter: Payer: Self-pay | Admitting: Cardiology

## 2019-12-11 ENCOUNTER — Ambulatory Visit: Payer: Medicare Other | Admitting: Cardiology

## 2019-12-11 NOTE — Progress Notes (Unsigned)
Clinical Summary Ms. Grieves is a 86 y.o.female  1. Afib - new diagnosis during recent pcp visit - actually from chart review was in afib by EKG Jan 2019 - seein consultation 06/2017 with afib after ERCP   2. Dissection Aorta?? - noted by MRI but not CT, no dye was used.   Past Medical History:  Diagnosis Date  . Acid reflux   . Anxiety   . CKD (chronic kidney disease)   . COPD (chronic obstructive pulmonary disease) ([place])   . Depressed   . Diabetes mellitus   . Glaucoma   . Hypercholesteremia   . Hypertension   . Legally blind in right eye, as defined in [place]   . Panic attacks   . Type 2 diabetes mellitus (H[facility])      Allergies  Allergen Reactions  . Hydromorphone Hcl Other (See Comments)    Patient goes out of right state of mind.      Current Outpatient Medications  Medication Sig Dispense Refill  . acetaminophen (TYLENOL) 500 MG tablet Take 500 mg by mouth every 6 (six) hours as needed for mild pain or moderate pain.    Marland Kitchen amLODipine (NORVASC) 5 MG tablet Take 5 mg by mouth daily.      . busPIRone (BUSPAR) 10 MG tablet     . cholecalciferol (VITAMIN D) 1000 units tablet Take 1,000 Units daily by mouth.    . Difluprednate (DUREZOL OP) Apply to eye daily.     . furosemide (LASIX) 20 MG tablet Take 20 mg daily as needed by mouth for fluid.    Marland Kitchen glipiZIDE (GLUCOTROL) 5 MG tablet Take 5 mg 2 (two) times daily by mouth.     . methocarbamol (ROBAXIN) 500 MG tablet Take 1 tablet (500 mg total) by mouth every 8 (eight) hours as needed for muscle spasms. 30 tablet 0  . omeprazole (PRILOSEC) 20 MG capsule Take 20 mg by mouth daily.      . pravastatin (PRAVACHOL) 40 MG tablet Take 40 mg by mouth daily.      . traMADol (ULTRAM) 50 MG tablet Take 1 tablet (50 mg total) by mouth every 12 (twelve) hours as needed for severe pain. 15 tablet 0   No current facility-administered medications for this visit.     Past Surgical History:  Procedure Laterality Date  .  APPENDECTOMY    . BALLOON DILATION  07/16/2017   Procedure: BALLOON DILATION;  Surgeon: Danie Binder, MD;  [facility];  Service: Endoscopy;;  . cataracts    . CHOLECYSTECTOMY    . ERCP N/A 07/16/2017   biliary papillary stenosis, filling defect c/w a stone and sludge, entire biliary tree dilated. completed biliary sphincterotomy and balloon extraction, stone removal  . ESOPHAGOGASTRODUODENOSCOPY  2006   Dr. Gala Romney: normal esophagus s/p empiric dilataton, small hiatal hernia  . HIP SURGERY     rod in leg as well  . MASS EXCISION Left 09/26/2017   Procedure: EXCISION MUCOID CYST WITH DISTAL INTERPHALANGEAL JOINT ARTHROTOMY OF LEFT MIDDLE FINGER;  Surgeon: Leanora Cover, MD;  Location: [facility];  Service: Orthopedics;  Laterality: Left;  . SPHINCTEROTOMY  07/16/2017   Procedure: SPHINCTEROTOMY;  Surgeon: Danie Binder, MD;  [facility];  Service: Endoscopy;;  . TUBAL LIGATION       Allergies  Allergen Reactions  . Hydromorphone Hcl Other (See Comments)    Patient goes out of right state of mind.  Family History  Problem Relation Age of Onset  . Blindness Father   . Bone cancer Father   . Brain cancer Sister   . Alzheimer's disease Brother   . Colon cancer Neg Hx      Social History Ms. Stech reports that she has never smoked. She has never used smokeless tobacco. Ms. Huot reports no history of alcohol use.   Review of Systems CONSTITUTIONAL: No weight loss, fever, chills, weakness or fatigue.  HEENT: Eyes: No visual loss, blurred vision, double vision or yellow sclerae.No hearing loss, sneezing, congestion, runny nose or sore throat.  SKIN: No rash or itching.  CARDIOVASCULAR:  RESPIRATORY: No shortness of breath, cough or sputum.  GASTROINTESTINAL: No anorexia, nausea, vomiting or diarrhea. No abdominal pain or blood.  GENITOURINARY: No burning on urination, no polyuria NEUROLOGICAL: No headache, dizziness, syncope,  paralysis, ataxia, numbness or tingling in the extremities. No change in bowel or bladder control.  MUSCULOSKELETAL: No muscle, back pain, joint pain or stiffness.  LYMPHATICS: No enlarged nodes. No history of splenectomy.  PSYCHIATRIC: No history of depression or anxiety.  ENDOCRINOLOGIC: No reports of sweating, cold or heat intolerance. No polyuria or polydipsia.  Marland Kitchen   Physical Examination There were no vitals filed for this visit. There were no vitals filed for this visit.  Gen: resting comfortably, no acute distress HEENT: no scleral icterus, pupils equal round and reactive, no palptable cervical adenopathy,  CV Resp: Clear to auscultation bilaterally GI: abdomen is soft, non-tender, non-distended, normal bowel sounds, no hepatosplenomegaly MSK: extremities are warm, no edema.  Skin: warm, no rash Neuro:  no focal deficits Psych: appropriate affect   Diagnostic Studies     Assessment and Plan        Arnoldo Lenis, M.D., F.A.C.C.

## 2019-12-12 DIAGNOSIS — H548 Legal blindness, as defined in USA: Secondary | ICD-10-CM | POA: Diagnosis not present

## 2019-12-12 DIAGNOSIS — E1165 Type 2 diabetes mellitus with hyperglycemia: Secondary | ICD-10-CM | POA: Diagnosis not present

## 2019-12-12 DIAGNOSIS — K219 Gastro-esophageal reflux disease without esophagitis: Secondary | ICD-10-CM | POA: Diagnosis not present

## 2019-12-12 DIAGNOSIS — I4891 Unspecified atrial fibrillation: Secondary | ICD-10-CM | POA: Diagnosis not present

## 2019-12-12 DIAGNOSIS — I129 Hypertensive chronic kidney disease with stage 1 through stage 4 chronic kidney disease, or unspecified chronic kidney disease: Secondary | ICD-10-CM | POA: Diagnosis not present

## 2019-12-12 DIAGNOSIS — E785 Hyperlipidemia, unspecified: Secondary | ICD-10-CM | POA: Diagnosis not present

## 2019-12-12 DIAGNOSIS — E1122 Type 2 diabetes mellitus with diabetic chronic kidney disease: Secondary | ICD-10-CM | POA: Diagnosis not present

## 2019-12-12 DIAGNOSIS — Z794 Long term (current) use of insulin: Secondary | ICD-10-CM | POA: Diagnosis not present

## 2019-12-12 DIAGNOSIS — M25572 Pain in left ankle and joints of left foot: Secondary | ICD-10-CM | POA: Diagnosis not present

## 2019-12-12 DIAGNOSIS — N184 Chronic kidney disease, stage 4 (severe): Secondary | ICD-10-CM | POA: Diagnosis not present

## 2019-12-12 DIAGNOSIS — G8929 Other chronic pain: Secondary | ICD-10-CM | POA: Diagnosis not present

## 2019-12-12 DIAGNOSIS — Z7984 Long term (current) use of oral hypoglycemic drugs: Secondary | ICD-10-CM | POA: Diagnosis not present

## 2019-12-12 DIAGNOSIS — F419 Anxiety disorder, unspecified: Secondary | ICD-10-CM | POA: Diagnosis not present

## 2019-12-15 DIAGNOSIS — E1165 Type 2 diabetes mellitus with hyperglycemia: Secondary | ICD-10-CM | POA: Diagnosis not present

## 2019-12-15 DIAGNOSIS — I129 Hypertensive chronic kidney disease with stage 1 through stage 4 chronic kidney disease, or unspecified chronic kidney disease: Secondary | ICD-10-CM | POA: Diagnosis not present

## 2019-12-15 DIAGNOSIS — E1122 Type 2 diabetes mellitus with diabetic chronic kidney disease: Secondary | ICD-10-CM | POA: Diagnosis not present

## 2019-12-15 DIAGNOSIS — I4891 Unspecified atrial fibrillation: Secondary | ICD-10-CM | POA: Diagnosis not present

## 2019-12-15 DIAGNOSIS — N184 Chronic kidney disease, stage 4 (severe): Secondary | ICD-10-CM | POA: Diagnosis not present

## 2019-12-15 DIAGNOSIS — H548 Legal blindness, as defined in USA: Secondary | ICD-10-CM | POA: Diagnosis not present

## 2019-12-15 NOTE — Progress Notes (Signed)
Chief Complaint  Patient presents with  . Foot Pain    left foot pain swelling about 6 weeks ago     History 86 year old female diabetic presents with a history of 4 weeks to 6 weeks of pain and swelling in the left foot spontaneous no trauma.  Went to urgent care had x-rays which were negative started on antibiotics for 7 days.  The patient's daughter is with her today and indicates that she sees the foot swell sometimes and then it goes down and she is noticed it turns blue sometimes and red sometimes.  The patient is already in diabetic shoes.  Patient complains of pain in the midfoot distally in the MTP joints in her toes.  She has chronic venous stasis disease as well evidenced by skin changes in color changes consistent with fat  Review of systems noted for double vision some blindness heartburn hematuria flank pain constipation joint pain depression  Past Medical History:  Diagnosis Date  . Acid reflux   . Anxiety   . CKD (chronic kidney disease)   . COPD (chronic obstructive pulmonary disease) ([place])   . Depressed   . Diabetes mellitus   . Glaucoma   . Hypercholesteremia   . Hypertension   . Legally blind in right eye, as defined in [place]   . Panic attacks   . Type 2 diabetes mellitus ([place])    Past Surgical History:  Procedure Laterality Date  . APPENDECTOMY    . BALLOON DILATION  07/16/2017   Procedure: BALLOON DILATION;  Surgeon: Danie Binder, MD;  [facility];  Service: Endoscopy;;  . cataracts    . CHOLECYSTECTOMY    . ERCP N/A 07/16/2017   biliary papillary stenosis, filling defect c/w a stone and sludge, entire biliary tree dilated. completed biliary sphincterotomy and balloon extraction, stone removal  . ESOPHAGOGASTRODUODENOSCOPY  2006   Dr. Gala Romney: normal esophagus s/p empiric dilataton, small hiatal hernia  . HIP SURGERY     rod in leg as well  . MASS EXCISION Left 09/26/2017   Procedure: EXCISION MUCOID CYST WITH DISTAL INTERPHALANGEAL JOINT  ARTHROTOMY OF LEFT MIDDLE FINGER;  Surgeon: Leanora Cover, MD;  Location: [place];  Service: Orthopedics;  Laterality: Left;  . SPHINCTEROTOMY  07/16/2017   Procedure: SPHINCTEROTOMY;  Surgeon: Danie Binder, MD;  [facility];  Service: Endoscopy;;  . TUBAL LIGATION      BP 135/89   Pulse 92   Ht 5\' 1"  (1.549 m)   Wt 173 lb (78.5 kg)   BMI 32.69 kg/m   The patient is awake alert and oriented mood and affect is normal she ambulates with assistive device.  Her left foot this and the shin show discoloration from chronic venous stasis no atrophy ankle stable ankle motion normal she is tender in the midfoot with some swelling there is some ruborous color to the toes and some purplish color but the capillary refill is normal her sensation to soft touch is normal there is no streaking.  X-ray of the left foot shows severe osteopenia no fracture or dislocation  Mild hallux valgus    Encounter Diagnoses  Name Primary?  . Venous stasis dermatitis of left lower extremity Yes  . Cellulitis of foot   . Diabetic autonomic neuropathy associated with other specified diabetes mellitus ([place])    It is really unclear which of the 3 things she actually has regular treat her for a fall 3 with a compression stocking prescription  as she has had over-the-counter ones that did not work well  We will start Keflex for 14 days and will start gabapentin  Meds ordered this encounter  Medications  . cephALEXin (KEFLEX) 500 MG capsule    Sig: Take 1 capsule (500 mg total) by mouth 2 (two) times daily.    Dispense:  28 capsule    Refill:  0  . gabapentin (NEURONTIN) 100 MG capsule    Sig: Take 1 capsule (100 mg total) by mouth 3 (three) times daily.    Dispense:  90 capsule    Refill:  2   1 month follow up

## 2019-12-16 ENCOUNTER — Other Ambulatory Visit: Payer: Self-pay

## 2019-12-16 ENCOUNTER — Encounter: Payer: Self-pay | Admitting: Orthopedic Surgery

## 2019-12-16 ENCOUNTER — Ambulatory Visit (INDEPENDENT_AMBULATORY_CARE_PROVIDER_SITE_OTHER): Payer: Medicare Other | Admitting: Orthopedic Surgery

## 2019-12-16 VITALS — BP 135/89 | HR 92 | Ht 61.0 in | Wt 173.0 lb

## 2019-12-16 DIAGNOSIS — I872 Venous insufficiency (chronic) (peripheral): Secondary | ICD-10-CM

## 2019-12-16 DIAGNOSIS — L03119 Cellulitis of unspecified part of limb: Secondary | ICD-10-CM

## 2019-12-16 DIAGNOSIS — E1343 Other specified diabetes mellitus with diabetic autonomic (poly)neuropathy: Secondary | ICD-10-CM

## 2019-12-16 MED ORDER — GABAPENTIN 100 MG PO CAPS: 100.0000 mg | ORAL_CAPSULE | Freq: Three times a day (TID) | ORAL | 2 refills | Status: DC

## 2019-12-16 MED ORDER — CEPHALEXIN 500 MG PO CAPS: 500.0000 mg | ORAL_CAPSULE | Freq: Two times a day (BID) | ORAL | 0 refills | Status: DC

## 2019-12-16 NOTE — Patient Instructions (Addendum)
You may have venous stasis disease diabetic neuropathy or cellulitis causing the problems in the foot  Were going to get a prescription for support hose for the venous stasis disease  Again to start gabapentin for the neuropathy  In regard to put you on antibiotics for the cellulitis  He will come back in a month    Diabetic Neuropathy Diabetic neuropathy refers to nerve damage that is caused by diabetes (diabetes mellitus). Over time, people with diabetes can develop nerve damage throughout the body. There are several types of diabetic neuropathy:  Peripheral neuropathy. This is the most common type of diabetic neuropathy. It causes damage to nerves that carry signals between the spinal cord and other parts of the body (peripheral nerves). This usually affects nerves in the feet and legs first, and may eventually affect the hands and arms. The damage affects the ability to sense touch or temperature.  Autonomic neuropathy. This type causes damage to nerves that control involuntary functions (autonomic nerves). These nerves carry signals that control: ? Heartbeat. ? Body temperature. ? Blood pressure. ? Urination. ? Digestion. ? Sweating. ? Sexual function. ? Response to changing blood sugar (glucose) levels.  Focal neuropathy. This type of nerve damage affects one area of the body, such as an arm, a leg, or the face. The injury may involve one nerve or a small group of nerves. Focal neuropathy can be painful and unpredictable, and occurs most often in older adults with diabetes. This often develops suddenly, but usually improves over time and does not cause long-term problems.  Proximal neuropathy. This type of nerve damage affects the nerves of the thighs, hips, buttocks, or legs. It causes severe pain, weakness, and muscle death (atrophy), usually in the thigh muscles. It is more common among older men and people who have type 2 diabetes. The length of recovery time may vary. What  are the causes? Peripheral, autonomic, and focal neuropathies are caused by diabetes that is not well controlled with treatment. The cause of proximal neuropathy is not known, but it may be caused by inflammation related to uncontrolled blood glucose levels. What are the signs or symptoms? Peripheral neuropathy Peripheral neuropathy develops slowly over time. When the nerves of the feet and legs no longer work, you may experience:  Burning, stabbing, or aching pain in the legs or feet.  Pain or cramping in the legs or feet.  Loss of feeling (numbness) and inability to feel pressure or pain in the feet. This can lead to: ? Thick calluses or sores on areas of constant pressure. ? Ulcers. ? Reduced ability to feel temperature changes.  Foot deformities.  Muscle weakness.  Loss of balance or coordination. Autonomic neuropathy The symptoms of autonomic neuropathy vary depending on which nerves are affected. Symptoms may include:  Problems with digestion, such as: ? Nausea or vomiting. ? Poor appetite. ? Bloating. ? Diarrhea or constipation. ? Trouble swallowing. ? Losing weight without trying to.  Problems with the heart, blood and lungs, such as: ? Dizziness, especially when standing up. ? Fainting. ? Shortness of breath. ? Irregular heartbeat.  Bladder problems, such as: ? Trouble starting or stopping urination. ? Leaking urine. ? Trouble emptying the bladder. ? Urinary tract infections (UTIs).  Problems with other body functions, such as: ? Sweat. You may sweat too much or too little. ? Temperature. You might get hot easily. Or, you might feel cold more than usual. ? Sexual function. Men may not be able to get or maintain an  erection. Women may have vaginal dryness and difficulty with arousal. Focal neuropathy Symptoms affect only one area of the body. Common symptoms include:  Numbness.  Tingling.  Burning pain.  Prickling feeling.  Very sensitive  skin.  Weakness.  Inability to move (paralysis).  Muscle twitching.  Muscles getting smaller (wasting).  Poor coordination.  Double or blurred vision. Proximal neuropathy  Sudden, severe pain in the hip, thigh, or buttocks. Pain may spread from the back into the legs (sciatica).  Pain and numbness in the arms and legs.  Tingling.  Loss of bladder control or bowel control.  Weakness and wasting of thigh muscles.  Difficulty getting up from a seated position.  Abdominal swelling.  Unexplained weight loss. How is this diagnosed? Diagnosis usually involves reviewing your medical history and any symptoms you have. Diagnosis varies depending on the type of neuropathy your health care provider suspects. Peripheral neuropathy Your health care provider will check areas that are affected by your nervous system (neurologic exam), such as your reflexes, how you move, and what you can feel. You may have other tests, such as:  Blood tests.  Removal and examination of fluid that surrounds the spinal cord (lumbar puncture).  CT scan.  MRI.  A test to check the nerves that control muscles (electromyogram, EMG).  Tests of how quickly messages pass through your nerves (nerve conduction velocity tests).  Removal of a small piece of nerve to be examined under a microscope (biopsy). Autonomic neuropathy You may have tests, such as:  Tests to measure your blood pressure and heart rate. This may include monitoring you while you are safely secured to an exam table that moves you from a lying position to an upright position (table tilt test).  Breathing tests to check your lungs.  Tests to check how food moves through the digestive system (gastric emptying tests).  Blood, sweat, or urine tests.  Ultrasound of your bladder.  Spinal fluid tests. Focal neuropathy This condition may be diagnosed with:  A neurologic exam.  CT scan.  MRI.  EMG.  Nerve conduction velocity  tests. Proximal neuropathy There is no test to diagnose this type of neuropathy. You may have tests to rule out other possible causes of this type of neuropathy. Tests may include:  X-rays of your spine and lumbar region.  Lumbar puncture.  MRI. How is this treated? The goal of treatment is to keep nerve damage from getting worse. The most important part of treatment is keeping your blood glucose level and your A1C level within your target range by following your diabetes management plan. Over time, maintaining lower blood glucose levels helps lessen symptoms. In some cases, you may need prescription pain medicine. Follow these instructions at home:  Lifestyle   Do not use any products that contain nicotine or tobacco, such as cigarettes and e-cigarettes. If you need help quitting, ask your health care provider.  Be physically active every day. Include strength training and balance exercises.  Follow a healthy meal plan.  Work with your health care provider to manage your blood pressure. General instructions  Follow your diabetes management plan as directed. ? Check your blood glucose levels as directed by your health care provider. ? Keep your blood glucose in your target range as directed by your health care provider. ? Have your A1C level checked at least two times a year, or as often as told by your health care provider.  Take over the counter and prescription medicines only as told by  your health care provider. This includes insulin and diabetes medicine.  Do not drive or use heavy machinery while taking prescription pain medicines.  Check your skin and feet every day for cuts, bruises, redness, blisters, or sores.  Keep all follow up visits as told by your health care provider. This is important. Contact a health care provider if:  You have burning, stabbing, or aching pain in your legs or feet.  You are unable to feel pressure or pain in your feet.  You develop  problems with digestion, such as: ? Nausea. ? Vomiting. ? Bloating. ? Constipation. ? Diarrhea. ? Abdominal pain.  You have difficulty with urination, such as inability: ? To control when you urinate (incontinence). ? To completely empty the bladder (retention).  You have palpitations.  You feel dizzy, weak, or faint when you stand up. Get help right away if:  You cannot urinate.  You have sudden weakness or loss of coordination.  You have trouble speaking.  You have pain or pressure in your chest.  You have an irregular heart beat.  You have sudden inability to move a part of your body. Summary  Diabetic neuropathy refers to nerve damage that is caused by diabetes. It can affect nerves throughout the entire body, causing numbness and pain in the arms, legs, digestive tract, heart, and other body systems.  Keep your blood glucose level and your blood pressure in your target range, as directed by your health care provider. This can help prevent neuropathy from getting worse.  Check your skin and feet every day for cuts, bruises, redness, blisters, or sores.  Do not use any products that contain nicotine or tobacco, such as cigarettes and e-cigarettes. If you need help quitting, ask your health care provider. This information is not intended to replace advice given to you by your health care provider. Make sure you discuss any questions you have with your health care provider. Document Revised: 09/25/2017 Document Reviewed: 09/17/2016 Elsevier Patient Education  2020 [place].  Chronic Venous Insufficiency Chronic venous insufficiency is a condition where the leg veins cannot effectively pump blood from the legs to the heart. This happens when the vein walls are either stretched, weakened, or damaged, or when the valves inside the vein are damaged. With the right treatment, you should be able to continue with an active life. This condition is also called venous  stasis. What are the causes? Common causes of this condition include:  High blood pressure inside the veins (venous hypertension).  Sitting or standing too long, causing increased blood pressure in the leg veins.  A blood clot that blocks blood flow in a vein (deep vein thrombosis, DVT).  Inflammation of a vein (phlebitis) that causes a blood clot to form.  Tumors in the pelvis that cause blood to back up. What increases the risk? The following factors may make you more likely to develop this condition:  Having a family history of this condition.  Obesity.  Pregnancy.  Living without enough regular physical activity or exercise (sedentary lifestyle).  Smoking.  Having a job that requires long periods of standing or sitting in one place.  Being a certain age. Women in their 7s and 25s and men in their 66s are more likely to develop this condition. What are the signs or symptoms? Symptoms of this condition include:  Veins that are enlarged, bulging, or twisted (varicose veins).  Skin breakdown or ulcers.  Reddened skin or dark discoloration of skin on the leg between  the knee and ankle.  Brown, smooth, tight, and painful skin just above the ankle, usually on the inside of the leg (lipodermatosclerosis).  Swelling of the legs. How is this diagnosed? This condition may be diagnosed based on:  Your medical history.  A physical exam.  Tests, such as: ? A procedure that creates an image of a blood vessel and nearby organs and provides information about blood flow through the blood vessel (duplex ultrasound). ? A procedure that tests blood flow (plethysmography). ? A procedure that looks at the veins using X-ray and dye (venogram). How is this treated? The goals of treatment are to help you return to an active life and to minimize pain or disability. Treatment depends on the severity of your condition, and it may include:  Wearing compression stockings. These can help  relieve symptoms and help prevent your condition from getting worse. However, they do not cure the condition.  Sclerotherapy. This procedure involves an injection of a solution that shrinks damaged veins.  Surgery. This may involve: ? Removing a diseased vein (vein stripping). ? Cutting off blood flow through the vein (laser ablation surgery). ? Repairing or reconstructing a valve within the affected vein. Follow these instructions at home:      Wear compression stockings as told by your health care provider. These stockings help to prevent blood clots and reduce swelling in your legs.  Take over-the-counter and prescription medicines only as told by your health care provider.  Stay active by exercising, walking, or doing different activities. Ask your health care provider what activities are safe for you and how much exercise you need.  Drink enough fluid to keep your urine pale yellow.  Do not use any products that contain nicotine or tobacco, such as cigarettes, e-cigarettes, and chewing tobacco. If you need help quitting, ask your health care provider.  Keep all follow-up visits as told by your health care provider. This is important. Contact a health care provider if you:  Have redness, swelling, or more pain in the affected area.  See a red streak or line that goes up or down from the affected area.  Have skin breakdown or skin loss in the affected area, even if the breakdown is small.  Get an injury in the affected area. Get help right away if:  You get an injury and an open wound in the affected area.  You have: ? Severe pain that does not get better with medicine. ? Sudden numbness or weakness in the foot or ankle below the affected area. ? Trouble moving your foot or ankle. ? A fever. ? Worse or persistent symptoms. ? Chest pain. ? Shortness of breath. Summary  Chronic venous insufficiency is a condition where the leg veins cannot effectively pump blood from  the legs to the heart.  Chronic venous insufficiency occurs when the vein walls become stretched, weakened, or damaged, or when valves within the vein are damaged.  Treatment depends on how severe your condition is. It often involves wearing compression stockings and may involve having a procedure.  Make sure you stay active by exercising, walking, or doing different activities. Ask your health care provider what activities are safe for you and how much exercise you need. This information is not intended to replace advice given to you by your health care provider. Make sure you discuss any questions you have with your health care provider. Document Revised: 05/06/2018 Document Reviewed: 05/06/2018 Elsevier Patient Education  [place].

## 2019-12-22 DIAGNOSIS — N184 Chronic kidney disease, stage 4 (severe): Secondary | ICD-10-CM | POA: Diagnosis not present

## 2019-12-22 DIAGNOSIS — I4891 Unspecified atrial fibrillation: Secondary | ICD-10-CM | POA: Diagnosis not present

## 2019-12-22 DIAGNOSIS — I129 Hypertensive chronic kidney disease with stage 1 through stage 4 chronic kidney disease, or unspecified chronic kidney disease: Secondary | ICD-10-CM | POA: Diagnosis not present

## 2019-12-22 DIAGNOSIS — E1165 Type 2 diabetes mellitus with hyperglycemia: Secondary | ICD-10-CM | POA: Diagnosis not present

## 2019-12-22 DIAGNOSIS — E1122 Type 2 diabetes mellitus with diabetic chronic kidney disease: Secondary | ICD-10-CM | POA: Diagnosis not present

## 2019-12-22 DIAGNOSIS — H548 Legal blindness, as defined in USA: Secondary | ICD-10-CM | POA: Diagnosis not present

## 2019-12-28 DIAGNOSIS — I129 Hypertensive chronic kidney disease with stage 1 through stage 4 chronic kidney disease, or unspecified chronic kidney disease: Secondary | ICD-10-CM | POA: Diagnosis not present

## 2019-12-28 DIAGNOSIS — E1165 Type 2 diabetes mellitus with hyperglycemia: Secondary | ICD-10-CM | POA: Diagnosis not present

## 2019-12-28 DIAGNOSIS — I4891 Unspecified atrial fibrillation: Secondary | ICD-10-CM | POA: Diagnosis not present

## 2019-12-28 DIAGNOSIS — N184 Chronic kidney disease, stage 4 (severe): Secondary | ICD-10-CM | POA: Diagnosis not present

## 2019-12-28 DIAGNOSIS — E1122 Type 2 diabetes mellitus with diabetic chronic kidney disease: Secondary | ICD-10-CM | POA: Diagnosis not present

## 2019-12-28 DIAGNOSIS — H548 Legal blindness, as defined in USA: Secondary | ICD-10-CM | POA: Diagnosis not present

## 2019-12-29 ENCOUNTER — Encounter: Payer: Self-pay | Admitting: Cardiology

## 2019-12-29 ENCOUNTER — Ambulatory Visit (INDEPENDENT_AMBULATORY_CARE_PROVIDER_SITE_OTHER): Payer: Medicare Other | Admitting: Cardiology

## 2019-12-29 ENCOUNTER — Other Ambulatory Visit: Payer: Self-pay

## 2019-12-29 VITALS — BP 124/84 | HR 68 | Ht 63.0 in | Wt 179.0 lb

## 2019-12-29 DIAGNOSIS — I10 Essential (primary) hypertension: Secondary | ICD-10-CM

## 2019-12-29 NOTE — Patient Instructions (Signed)

## 2019-12-29 NOTE — Progress Notes (Signed)
Clinical Summary [name] is a 86 y.o.female seen as new consult, referred by Dr Nevada Crane for afib.   1. Afib - noted during 06/2017 admission after ERCP - did not require av nodal agents, was to start anticoag but does appear was never started.   - started xarelto 15mg  by pcp just recently after EKG showed afib again. CrCl caluated today is 32, she is on correct dosing of anticoag  2. Possible descending aortic dissection During 06/2017 admission suggestive findings on MRI but not present by CT, could not use contrast due to renal dysfunction - not further pursued - at her advancd age and comorbidities would not repeat any imaging to reassess   Past Medical History:  Diagnosis Date  . Acid reflux   . Anxiety   . CKD (chronic kidney disease)   . COPD (chronic obstructive pulmonary disease) ([place])   . Depressed   . Diabetes mellitus   . Glaucoma   . Hypercholesteremia   . Hypertension   . Legally blind in right eye, as defined in [place]   . Panic attacks   . Type 2 diabetes mellitus (H[facility])      Allergies  Allergen Reactions  . Hydromorphone Hcl Other (See Comments)    Patient goes out of right state of mind.      Current Outpatient Medications  Medication Sig Dispense Refill  . acetaminophen (TYLENOL) 500 MG tablet Take 500 mg by mouth every 6 (six) hours as needed for mild pain or moderate pain.    Marland Kitchen amLODipine (NORVASC) 5 MG tablet Take 5 mg by mouth daily.      . busPIRone (BUSPAR) 10 MG tablet     . cephALEXin (KEFLEX) 500 MG capsule Take 1 capsule (500 mg total) by mouth 2 (two) times daily. 28 capsule 0  . cholecalciferol (VITAMIN D) 1000 units tablet Take 1,000 Units daily by mouth.    . Difluprednate (DUREZOL OP) Apply to eye daily.     . furosemide (LASIX) 20 MG tablet Take 20 mg daily as needed by mouth for fluid.    Marland Kitchen gabapentin (NEURONTIN) 100 MG capsule Take 1 capsule (100 mg total) by mouth 3 (three) times daily. 90 capsule 2  . glipiZIDE (GLUCOTROL) 5  MG tablet Take 5 mg 2 (two) times daily by mouth.     . latanoprost (XALATAN) 0.005 % ophthalmic solution Place 1 drop into both eyes nightly.    . methocarbamol (ROBAXIN) 500 MG tablet Take 1 tablet (500 mg total) by mouth every 8 (eight) hours as needed for muscle spasms. 30 tablet 0  . omeprazole (PRILOSEC) 20 MG capsule Take 20 mg by mouth daily.      . pravastatin (PRAVACHOL) 40 MG tablet Take 40 mg by mouth daily.      . traMADol (ULTRAM) 50 MG tablet Take 1 tablet (50 mg total) by mouth every 12 (twelve) hours as needed for severe pain. 15 tablet 0  . TRESIBA FLEXTOUCH 100 UNIT/ML FlexTouch Pen     . XARELTO 15 MG TABS tablet Take 15 mg by mouth daily.     No current facility-administered medications for this visit.     Past Surgical History:  Procedure Laterality Date  . APPENDECTOMY    . BALLOON DILATION  07/16/2017   Procedure: BALLOON DILATION;  Surgeon: Danie Binder, MD;  [facility];  Service: Endoscopy;;  . cataracts    . CHOLECYSTECTOMY    . ERCP N/A 07/16/2017  biliary papillary stenosis, filling defect c/w a stone and sludge, entire biliary tree dilated. completed biliary sphincterotomy and balloon extraction, stone removal  . ESOPHAGOGASTRODUODENOSCOPY  2006   Dr. Gala Romney: normal esophagus s/p empiric dilataton, small hiatal hernia  . HIP SURGERY     rod in leg as well  . MASS EXCISION Left 09/26/2017   Procedure: EXCISION MUCOID CYST WITH DISTAL INTERPHALANGEAL JOINT ARTHROTOMY OF LEFT MIDDLE FINGER;  Surgeon: Leanora Cover, MD;  Location: [facility];  Service: Orthopedics;  Laterality: Left;  . SPHINCTEROTOMY  07/16/2017   Procedure: SPHINCTEROTOMY;  Surgeon: Danie Binder, MD;  [facility];  Service: Endoscopy;;  . TUBAL LIGATION       Allergies  Allergen Reactions  . Hydromorphone Hcl Other (See Comments)    Patient goes out of right state of mind.       Family History  Problem Relation Age of Onset  .  Blindness Father   . Bone cancer Father   . Brain cancer Sister   . Alzheimer's disease Brother   . Colon cancer Neg Hx      Social History [name] reports that she has never smoked. She has never used smokeless tobacco. [name] reports no history of alcohol use.   Review of Systems CONSTITUTIONAL: No weight loss, fever, chills, weakness or fatigue.  HEENT: Eyes: No visual loss, blurred vision, double vision or yellow sclerae.No hearing loss, sneezing, congestion, runny nose or sore throat.  SKIN: No rash or itching.  CARDIOVASCULAR: per hpi RESPIRATORY: No shortness of breath, cough or sputum.  GASTROINTESTINAL: No anorexia, nausea, vomiting or diarrhea. No abdominal pain or blood.  GENITOURINARY: No burning on urination, no polyuria NEUROLOGICAL: No headache, dizziness, syncope, paralysis, ataxia, numbness or tingling in the extremities. No change in bowel or bladder control.  MUSCULOSKELETAL: No muscle, back pain, joint pain or stiffness.  LYMPHATICS: No enlarged nodes. No history of splenectomy.  PSYCHIATRIC: No history of depression or anxiety.  ENDOCRINOLOGIC: No reports of sweating, cold or heat intolerance. No polyuria or polydipsia.  Marland Kitchen   Physical Examination Today's Vitals   12/29/19 1322  BP: 124/84  Pulse: 68  SpO2: 98%  Weight: 179 lb (81.2 kg)  Height: 5\' 3"  (1.6 m)   Body mass index is 31.71 kg/m.  Gen: resting comfortably, no acute distress HEENT: no scleral icterus, pupils equal round and reactive, no palptable cervical adenopathy,  CV: irreg, no m/r/g, no jvd Resp: Clear to auscultation bilaterally GI: abdomen is soft, non-tender, non-distended, normal bowel sounds, no hepatosplenomegaly MSK: extremities are warm, no edema.  Skin: warm, no rash Neuro:  no focal deficits Psych: appropriate affect     Assessment and Plan  1. Afib - diagnosed in 2018 during admission, does not appear she ever started anticoag as outpatient - seen again by  pcp recently, started on renally adjusted xarelto - she does not require av nodal agents, afib with slow VR, she has conduction disease that will be monitred at this time.  - continue to monitor at this time - severe LAE, with self rate controlled afib that is asymptomatic would not plan for DCCV trial.    F/u 6 months   Arnoldo Lenis, M.D.

## 2020-01-04 DIAGNOSIS — H548 Legal blindness, as defined in USA: Secondary | ICD-10-CM | POA: Diagnosis not present

## 2020-01-04 DIAGNOSIS — I4891 Unspecified atrial fibrillation: Secondary | ICD-10-CM | POA: Diagnosis not present

## 2020-01-04 DIAGNOSIS — I129 Hypertensive chronic kidney disease with stage 1 through stage 4 chronic kidney disease, or unspecified chronic kidney disease: Secondary | ICD-10-CM | POA: Diagnosis not present

## 2020-01-04 DIAGNOSIS — E1165 Type 2 diabetes mellitus with hyperglycemia: Secondary | ICD-10-CM | POA: Diagnosis not present

## 2020-01-04 DIAGNOSIS — E1122 Type 2 diabetes mellitus with diabetic chronic kidney disease: Secondary | ICD-10-CM | POA: Diagnosis not present

## 2020-01-04 DIAGNOSIS — N184 Chronic kidney disease, stage 4 (severe): Secondary | ICD-10-CM | POA: Diagnosis not present

## 2020-01-11 DIAGNOSIS — E785 Hyperlipidemia, unspecified: Secondary | ICD-10-CM | POA: Diagnosis not present

## 2020-01-11 DIAGNOSIS — E1122 Type 2 diabetes mellitus with diabetic chronic kidney disease: Secondary | ICD-10-CM | POA: Diagnosis not present

## 2020-01-11 DIAGNOSIS — Z7984 Long term (current) use of oral hypoglycemic drugs: Secondary | ICD-10-CM | POA: Diagnosis not present

## 2020-01-11 DIAGNOSIS — M25572 Pain in left ankle and joints of left foot: Secondary | ICD-10-CM | POA: Diagnosis not present

## 2020-01-11 DIAGNOSIS — K219 Gastro-esophageal reflux disease without esophagitis: Secondary | ICD-10-CM | POA: Diagnosis not present

## 2020-01-11 DIAGNOSIS — F419 Anxiety disorder, unspecified: Secondary | ICD-10-CM | POA: Diagnosis not present

## 2020-01-11 DIAGNOSIS — Z794 Long term (current) use of insulin: Secondary | ICD-10-CM | POA: Diagnosis not present

## 2020-01-11 DIAGNOSIS — I129 Hypertensive chronic kidney disease with stage 1 through stage 4 chronic kidney disease, or unspecified chronic kidney disease: Secondary | ICD-10-CM | POA: Diagnosis not present

## 2020-01-11 DIAGNOSIS — E1165 Type 2 diabetes mellitus with hyperglycemia: Secondary | ICD-10-CM | POA: Diagnosis not present

## 2020-01-11 DIAGNOSIS — G8929 Other chronic pain: Secondary | ICD-10-CM | POA: Diagnosis not present

## 2020-01-11 DIAGNOSIS — H548 Legal blindness, as defined in USA: Secondary | ICD-10-CM | POA: Diagnosis not present

## 2020-01-11 DIAGNOSIS — I4891 Unspecified atrial fibrillation: Secondary | ICD-10-CM | POA: Diagnosis not present

## 2020-01-11 DIAGNOSIS — N184 Chronic kidney disease, stage 4 (severe): Secondary | ICD-10-CM | POA: Diagnosis not present

## 2020-01-15 ENCOUNTER — Other Ambulatory Visit: Payer: Self-pay

## 2020-01-15 ENCOUNTER — Ambulatory Visit (INDEPENDENT_AMBULATORY_CARE_PROVIDER_SITE_OTHER): Payer: Medicare Other | Admitting: Orthopedic Surgery

## 2020-01-15 VITALS — BP 126/79 | HR 76 | Ht 63.0 in | Wt 179.0 lb

## 2020-01-15 DIAGNOSIS — L03119 Cellulitis of unspecified part of limb: Secondary | ICD-10-CM | POA: Diagnosis not present

## 2020-01-15 DIAGNOSIS — M19072 Primary osteoarthritis, left ankle and foot: Secondary | ICD-10-CM | POA: Diagnosis not present

## 2020-01-15 DIAGNOSIS — I872 Venous insufficiency (chronic) (peripheral): Secondary | ICD-10-CM | POA: Diagnosis not present

## 2020-01-15 NOTE — Progress Notes (Signed)
Chief Complaint  Patient presents with  . Foot Pain    Recheck on left foot.    86 year old female treating now for cellulitis with Keflex from chronic venous stasis disease and chronic left foot pain.  Patient has neuropathy from diabetes she has midfoot pain which is persisted despite having good diabetic shoes with good arch support.  Her cellulitis seems to be better with the Keflex her chronic venous stasis disease is stable  Her pain in the left foot remains quite significant  After discussing options we noted that tramadol is not helping her pain  We decided to try a cam walker short and see if that helps  Follow-up 6 weeks related to the pain in the foot  Continue stockings for stable chronic venous stasis disease  Cellulitis improved  Encounter Diagnoses  Name Primary?  . Venous stasis dermatitis of left lower extremity Yes  . Cellulitis of foot   . Arthritis of left foot

## 2020-01-21 DIAGNOSIS — M130 Polyarthritis, unspecified: Secondary | ICD-10-CM | POA: Diagnosis not present

## 2020-01-21 DIAGNOSIS — M25572 Pain in left ankle and joints of left foot: Secondary | ICD-10-CM | POA: Diagnosis not present

## 2020-01-21 DIAGNOSIS — R269 Unspecified abnormalities of gait and mobility: Secondary | ICD-10-CM | POA: Diagnosis not present

## 2020-01-21 DIAGNOSIS — E1140 Type 2 diabetes mellitus with diabetic neuropathy, unspecified: Secondary | ICD-10-CM | POA: Diagnosis not present

## 2020-01-21 DIAGNOSIS — H548 Legal blindness, as defined in USA: Secondary | ICD-10-CM | POA: Diagnosis not present

## 2020-01-26 DIAGNOSIS — N390 Urinary tract infection, site not specified: Secondary | ICD-10-CM | POA: Diagnosis not present

## 2020-02-10 DIAGNOSIS — G8929 Other chronic pain: Secondary | ICD-10-CM | POA: Diagnosis not present

## 2020-02-10 DIAGNOSIS — N184 Chronic kidney disease, stage 4 (severe): Secondary | ICD-10-CM | POA: Diagnosis not present

## 2020-02-10 DIAGNOSIS — K219 Gastro-esophageal reflux disease without esophagitis: Secondary | ICD-10-CM | POA: Diagnosis not present

## 2020-02-10 DIAGNOSIS — N390 Urinary tract infection, site not specified: Secondary | ICD-10-CM | POA: Diagnosis not present

## 2020-02-10 DIAGNOSIS — H548 Legal blindness, as defined in USA: Secondary | ICD-10-CM | POA: Diagnosis not present

## 2020-02-10 DIAGNOSIS — I4891 Unspecified atrial fibrillation: Secondary | ICD-10-CM | POA: Diagnosis not present

## 2020-02-10 DIAGNOSIS — M1990 Unspecified osteoarthritis, unspecified site: Secondary | ICD-10-CM | POA: Diagnosis not present

## 2020-02-10 DIAGNOSIS — J449 Chronic obstructive pulmonary disease, unspecified: Secondary | ICD-10-CM | POA: Diagnosis not present

## 2020-02-10 DIAGNOSIS — Z794 Long term (current) use of insulin: Secondary | ICD-10-CM | POA: Diagnosis not present

## 2020-02-10 DIAGNOSIS — E785 Hyperlipidemia, unspecified: Secondary | ICD-10-CM | POA: Diagnosis not present

## 2020-02-10 DIAGNOSIS — E1122 Type 2 diabetes mellitus with diabetic chronic kidney disease: Secondary | ICD-10-CM | POA: Diagnosis not present

## 2020-02-10 DIAGNOSIS — I129 Hypertensive chronic kidney disease with stage 1 through stage 4 chronic kidney disease, or unspecified chronic kidney disease: Secondary | ICD-10-CM | POA: Diagnosis not present

## 2020-02-10 DIAGNOSIS — F419 Anxiety disorder, unspecified: Secondary | ICD-10-CM | POA: Diagnosis not present

## 2020-02-10 DIAGNOSIS — E1165 Type 2 diabetes mellitus with hyperglycemia: Secondary | ICD-10-CM | POA: Diagnosis not present

## 2020-02-10 DIAGNOSIS — M25572 Pain in left ankle and joints of left foot: Secondary | ICD-10-CM | POA: Diagnosis not present

## 2020-02-10 DIAGNOSIS — H3530 Unspecified macular degeneration: Secondary | ICD-10-CM | POA: Diagnosis not present

## 2020-02-11 DIAGNOSIS — H548 Legal blindness, as defined in USA: Secondary | ICD-10-CM | POA: Diagnosis not present

## 2020-02-11 DIAGNOSIS — E1122 Type 2 diabetes mellitus with diabetic chronic kidney disease: Secondary | ICD-10-CM | POA: Diagnosis not present

## 2020-02-11 DIAGNOSIS — E1165 Type 2 diabetes mellitus with hyperglycemia: Secondary | ICD-10-CM | POA: Diagnosis not present

## 2020-02-11 DIAGNOSIS — N390 Urinary tract infection, site not specified: Secondary | ICD-10-CM | POA: Diagnosis not present

## 2020-02-11 DIAGNOSIS — I4891 Unspecified atrial fibrillation: Secondary | ICD-10-CM | POA: Diagnosis not present

## 2020-02-11 DIAGNOSIS — I129 Hypertensive chronic kidney disease with stage 1 through stage 4 chronic kidney disease, or unspecified chronic kidney disease: Secondary | ICD-10-CM | POA: Diagnosis not present

## 2020-02-17 ENCOUNTER — Ambulatory Visit: Payer: Medicare Other | Admitting: Family Medicine

## 2020-02-17 DIAGNOSIS — I129 Hypertensive chronic kidney disease with stage 1 through stage 4 chronic kidney disease, or unspecified chronic kidney disease: Secondary | ICD-10-CM | POA: Diagnosis not present

## 2020-02-17 DIAGNOSIS — E1165 Type 2 diabetes mellitus with hyperglycemia: Secondary | ICD-10-CM | POA: Diagnosis not present

## 2020-02-17 DIAGNOSIS — I4891 Unspecified atrial fibrillation: Secondary | ICD-10-CM | POA: Diagnosis not present

## 2020-02-17 DIAGNOSIS — E1122 Type 2 diabetes mellitus with diabetic chronic kidney disease: Secondary | ICD-10-CM | POA: Diagnosis not present

## 2020-02-17 DIAGNOSIS — H548 Legal blindness, as defined in USA: Secondary | ICD-10-CM | POA: Diagnosis not present

## 2020-02-17 DIAGNOSIS — N390 Urinary tract infection, site not specified: Secondary | ICD-10-CM | POA: Diagnosis not present

## 2020-02-23 DIAGNOSIS — I4891 Unspecified atrial fibrillation: Secondary | ICD-10-CM | POA: Diagnosis not present

## 2020-02-23 DIAGNOSIS — N390 Urinary tract infection, site not specified: Secondary | ICD-10-CM | POA: Diagnosis not present

## 2020-02-23 DIAGNOSIS — E1122 Type 2 diabetes mellitus with diabetic chronic kidney disease: Secondary | ICD-10-CM | POA: Diagnosis not present

## 2020-02-23 DIAGNOSIS — H548 Legal blindness, as defined in USA: Secondary | ICD-10-CM | POA: Diagnosis not present

## 2020-02-23 DIAGNOSIS — E1165 Type 2 diabetes mellitus with hyperglycemia: Secondary | ICD-10-CM | POA: Diagnosis not present

## 2020-02-23 DIAGNOSIS — I129 Hypertensive chronic kidney disease with stage 1 through stage 4 chronic kidney disease, or unspecified chronic kidney disease: Secondary | ICD-10-CM | POA: Diagnosis not present

## 2020-02-26 ENCOUNTER — Ambulatory Visit (INDEPENDENT_AMBULATORY_CARE_PROVIDER_SITE_OTHER): Payer: Medicare Other | Admitting: Orthopedic Surgery

## 2020-02-26 ENCOUNTER — Other Ambulatory Visit: Payer: Self-pay

## 2020-02-26 VITALS — BP 126/76 | HR 76 | Ht 63.0 in | Wt 179.0 lb

## 2020-02-26 DIAGNOSIS — E1343 Other specified diabetes mellitus with diabetic autonomic (poly)neuropathy: Secondary | ICD-10-CM

## 2020-02-26 DIAGNOSIS — I872 Venous insufficiency (chronic) (peripheral): Secondary | ICD-10-CM

## 2020-02-26 DIAGNOSIS — M25512 Pain in left shoulder: Secondary | ICD-10-CM

## 2020-02-26 NOTE — Progress Notes (Signed)
Chief Complaint  Patient presents with  . Follow-up    6 week recheck on left foot.   RE CHECK LEG SWELLING AND FOOT PAIN (LEFT) AND NEW ONSET RIGHT FOOT PAIN;  NEW PAIN LEFT SHOULDER AFTER EMS TRIED TO PICK HER UP; she indicates it is hard for her to raise her arm up in the air and reach across her chest   BP 126/76   Pulse 76   Ht 5\' 3"  (1.6 m)   Wt 179 lb (81.2 kg)   BMI 31.71 kg/m   Physical Exam Constitutional:      Appearance: She is normal weight.  Musculoskeletal:     Right lower leg: 1+ Edema present.     Left lower leg: 1+ Edema present.  Skin:    General: Skin is warm and dry.     Capillary Refill: Capillary refill takes less than 2 seconds.     Findings: No erythema or rash.  Neurological:     Mental Status: She is alert and oriented to person, place, and time.  Psychiatric:        Mood and Affect: Mood normal.        Thought Content: Thought content normal.    Left shoulder positive impingement sign tenderness in the rotator interval pain with internal/external rotation and forward elevation of 120 degrees  Recommend:  Procedure note the subacromial injection shoulder left   Verbal consent was obtained to inject the  Left   Shoulder  Timeout was completed to confirm the injection site is a subacromial space of the  left  shoulder  Medication used Depo-Medrol 40 mg and lidocaine 1% 3 cc  Anesthesia was provided by ethyl chloride  The injection was performed in the left  posterior subacromial space. After pinning the skin with alcohol and anesthetized the skin with ethyl chloride the subacromial space was injected using a 20-gauge needle. There were no complications  Sterile dressing was applied.   Recommend continue TED hose change tramadol to every 6

## 2020-03-01 DIAGNOSIS — E1122 Type 2 diabetes mellitus with diabetic chronic kidney disease: Secondary | ICD-10-CM | POA: Diagnosis not present

## 2020-03-01 DIAGNOSIS — E1165 Type 2 diabetes mellitus with hyperglycemia: Secondary | ICD-10-CM | POA: Diagnosis not present

## 2020-03-01 DIAGNOSIS — I4891 Unspecified atrial fibrillation: Secondary | ICD-10-CM | POA: Diagnosis not present

## 2020-03-01 DIAGNOSIS — I129 Hypertensive chronic kidney disease with stage 1 through stage 4 chronic kidney disease, or unspecified chronic kidney disease: Secondary | ICD-10-CM | POA: Diagnosis not present

## 2020-03-01 DIAGNOSIS — H548 Legal blindness, as defined in USA: Secondary | ICD-10-CM | POA: Diagnosis not present

## 2020-03-01 DIAGNOSIS — N390 Urinary tract infection, site not specified: Secondary | ICD-10-CM | POA: Diagnosis not present

## 2020-03-08 DIAGNOSIS — H548 Legal blindness, as defined in USA: Secondary | ICD-10-CM | POA: Diagnosis not present

## 2020-03-08 DIAGNOSIS — E1122 Type 2 diabetes mellitus with diabetic chronic kidney disease: Secondary | ICD-10-CM | POA: Diagnosis not present

## 2020-03-08 DIAGNOSIS — N390 Urinary tract infection, site not specified: Secondary | ICD-10-CM | POA: Diagnosis not present

## 2020-03-08 DIAGNOSIS — E1165 Type 2 diabetes mellitus with hyperglycemia: Secondary | ICD-10-CM | POA: Diagnosis not present

## 2020-03-08 DIAGNOSIS — I4891 Unspecified atrial fibrillation: Secondary | ICD-10-CM | POA: Diagnosis not present

## 2020-03-08 DIAGNOSIS — I129 Hypertensive chronic kidney disease with stage 1 through stage 4 chronic kidney disease, or unspecified chronic kidney disease: Secondary | ICD-10-CM | POA: Diagnosis not present

## 2020-03-11 DIAGNOSIS — E1165 Type 2 diabetes mellitus with hyperglycemia: Secondary | ICD-10-CM | POA: Diagnosis not present

## 2020-03-11 DIAGNOSIS — H548 Legal blindness, as defined in USA: Secondary | ICD-10-CM | POA: Diagnosis not present

## 2020-03-11 DIAGNOSIS — I4891 Unspecified atrial fibrillation: Secondary | ICD-10-CM | POA: Diagnosis not present

## 2020-03-11 DIAGNOSIS — N390 Urinary tract infection, site not specified: Secondary | ICD-10-CM | POA: Diagnosis not present

## 2020-03-11 DIAGNOSIS — F419 Anxiety disorder, unspecified: Secondary | ICD-10-CM | POA: Diagnosis not present

## 2020-03-11 DIAGNOSIS — H3530 Unspecified macular degeneration: Secondary | ICD-10-CM | POA: Diagnosis not present

## 2020-03-11 DIAGNOSIS — E1122 Type 2 diabetes mellitus with diabetic chronic kidney disease: Secondary | ICD-10-CM | POA: Diagnosis not present

## 2020-03-11 DIAGNOSIS — I129 Hypertensive chronic kidney disease with stage 1 through stage 4 chronic kidney disease, or unspecified chronic kidney disease: Secondary | ICD-10-CM | POA: Diagnosis not present

## 2020-03-11 DIAGNOSIS — M25572 Pain in left ankle and joints of left foot: Secondary | ICD-10-CM | POA: Diagnosis not present

## 2020-03-11 DIAGNOSIS — N184 Chronic kidney disease, stage 4 (severe): Secondary | ICD-10-CM | POA: Diagnosis not present

## 2020-03-11 DIAGNOSIS — G8929 Other chronic pain: Secondary | ICD-10-CM | POA: Diagnosis not present

## 2020-03-11 DIAGNOSIS — J449 Chronic obstructive pulmonary disease, unspecified: Secondary | ICD-10-CM | POA: Diagnosis not present

## 2020-03-11 DIAGNOSIS — E785 Hyperlipidemia, unspecified: Secondary | ICD-10-CM | POA: Diagnosis not present

## 2020-03-11 DIAGNOSIS — M1990 Unspecified osteoarthritis, unspecified site: Secondary | ICD-10-CM | POA: Diagnosis not present

## 2020-03-11 DIAGNOSIS — K219 Gastro-esophageal reflux disease without esophagitis: Secondary | ICD-10-CM | POA: Diagnosis not present

## 2020-03-11 DIAGNOSIS — Z794 Long term (current) use of insulin: Secondary | ICD-10-CM | POA: Diagnosis not present

## 2020-03-14 DIAGNOSIS — E1122 Type 2 diabetes mellitus with diabetic chronic kidney disease: Secondary | ICD-10-CM | POA: Diagnosis not present

## 2020-03-14 DIAGNOSIS — N184 Chronic kidney disease, stage 4 (severe): Secondary | ICD-10-CM | POA: Diagnosis not present

## 2020-03-14 DIAGNOSIS — J449 Chronic obstructive pulmonary disease, unspecified: Secondary | ICD-10-CM | POA: Diagnosis not present

## 2020-03-14 DIAGNOSIS — E785 Hyperlipidemia, unspecified: Secondary | ICD-10-CM | POA: Diagnosis not present

## 2020-03-14 DIAGNOSIS — I129 Hypertensive chronic kidney disease with stage 1 through stage 4 chronic kidney disease, or unspecified chronic kidney disease: Secondary | ICD-10-CM | POA: Diagnosis not present

## 2020-03-14 DIAGNOSIS — F419 Anxiety disorder, unspecified: Secondary | ICD-10-CM | POA: Diagnosis not present

## 2020-03-14 DIAGNOSIS — K219 Gastro-esophageal reflux disease without esophagitis: Secondary | ICD-10-CM | POA: Diagnosis not present

## 2020-03-14 DIAGNOSIS — E1165 Type 2 diabetes mellitus with hyperglycemia: Secondary | ICD-10-CM | POA: Diagnosis not present

## 2020-03-14 DIAGNOSIS — N390 Urinary tract infection, site not specified: Secondary | ICD-10-CM | POA: Diagnosis not present

## 2020-03-14 DIAGNOSIS — H548 Legal blindness, as defined in USA: Secondary | ICD-10-CM | POA: Diagnosis not present

## 2020-03-14 DIAGNOSIS — I4891 Unspecified atrial fibrillation: Secondary | ICD-10-CM | POA: Diagnosis not present

## 2020-03-14 DIAGNOSIS — M25572 Pain in left ankle and joints of left foot: Secondary | ICD-10-CM | POA: Diagnosis not present

## 2020-03-16 DIAGNOSIS — I129 Hypertensive chronic kidney disease with stage 1 through stage 4 chronic kidney disease, or unspecified chronic kidney disease: Secondary | ICD-10-CM | POA: Diagnosis not present

## 2020-03-16 DIAGNOSIS — H548 Legal blindness, as defined in USA: Secondary | ICD-10-CM | POA: Diagnosis not present

## 2020-03-16 DIAGNOSIS — E1165 Type 2 diabetes mellitus with hyperglycemia: Secondary | ICD-10-CM | POA: Diagnosis not present

## 2020-03-16 DIAGNOSIS — E1122 Type 2 diabetes mellitus with diabetic chronic kidney disease: Secondary | ICD-10-CM | POA: Diagnosis not present

## 2020-03-16 DIAGNOSIS — I4891 Unspecified atrial fibrillation: Secondary | ICD-10-CM | POA: Diagnosis not present

## 2020-03-16 DIAGNOSIS — N390 Urinary tract infection, site not specified: Secondary | ICD-10-CM | POA: Diagnosis not present

## 2020-03-17 DIAGNOSIS — I129 Hypertensive chronic kidney disease with stage 1 through stage 4 chronic kidney disease, or unspecified chronic kidney disease: Secondary | ICD-10-CM | POA: Diagnosis not present

## 2020-03-17 DIAGNOSIS — F419 Anxiety disorder, unspecified: Secondary | ICD-10-CM | POA: Diagnosis not present

## 2020-03-17 DIAGNOSIS — E1165 Type 2 diabetes mellitus with hyperglycemia: Secondary | ICD-10-CM | POA: Diagnosis not present

## 2020-03-17 DIAGNOSIS — R600 Localized edema: Secondary | ICD-10-CM | POA: Diagnosis not present

## 2020-03-17 DIAGNOSIS — E785 Hyperlipidemia, unspecified: Secondary | ICD-10-CM | POA: Diagnosis not present

## 2020-03-17 DIAGNOSIS — E875 Hyperkalemia: Secondary | ICD-10-CM | POA: Diagnosis not present

## 2020-03-17 DIAGNOSIS — K219 Gastro-esophageal reflux disease without esophagitis: Secondary | ICD-10-CM | POA: Diagnosis not present

## 2020-03-17 DIAGNOSIS — I4891 Unspecified atrial fibrillation: Secondary | ICD-10-CM | POA: Diagnosis not present

## 2020-03-17 DIAGNOSIS — N184 Chronic kidney disease, stage 4 (severe): Secondary | ICD-10-CM | POA: Diagnosis not present

## 2020-03-17 DIAGNOSIS — G8929 Other chronic pain: Secondary | ICD-10-CM | POA: Diagnosis not present

## 2020-03-22 ENCOUNTER — Ambulatory Visit (INDEPENDENT_AMBULATORY_CARE_PROVIDER_SITE_OTHER): Payer: Medicare Other | Admitting: Orthopedic Surgery

## 2020-03-22 ENCOUNTER — Other Ambulatory Visit: Payer: Self-pay

## 2020-03-22 ENCOUNTER — Encounter: Payer: Self-pay | Admitting: Orthopedic Surgery

## 2020-03-22 VITALS — BP 142/91 | HR 92 | Ht 63.0 in | Wt 179.0 lb

## 2020-03-22 DIAGNOSIS — E1343 Other specified diabetes mellitus with diabetic autonomic (poly)neuropathy: Secondary | ICD-10-CM | POA: Diagnosis not present

## 2020-03-22 DIAGNOSIS — I872 Venous insufficiency (chronic) (peripheral): Secondary | ICD-10-CM

## 2020-03-22 DIAGNOSIS — M25512 Pain in left shoulder: Secondary | ICD-10-CM | POA: Diagnosis not present

## 2020-03-22 DIAGNOSIS — G8929 Other chronic pain: Secondary | ICD-10-CM

## 2020-03-22 NOTE — Progress Notes (Signed)
[name]  03/22/2020  Body mass index is 31.71 kg/m.   S:  Chief Complaint  Patient presents with  . Foot Pain    left / worse today thinks d/c boot increased pain  . Shoulder Pain    left had injection last visit helped some painful again   86 year old female multiple joint pains and aches venous stasis disease gives her bilateral lower leg pain  She is using stockings we put her on gabapentin then tramadol which has helped her pain although she still complains of discomfort in the feet she is got good supportive shoes  Her left shoulder hurts her left arm hurts status post injection   O: BP (!) 142/91   Pulse 92   Ht 5\' 3"  (1.6 m)   Wt 179 lb (81.2 kg)   BMI 31.71 kg/m   Physical Exam  I see improved swelling in both feet especially on the left is tender on both sides no skin ulcers  Pain and decreased range of motion and pain with range of motion left shoulder and arm   MEDICAL DECISION MAKING  Encounter Diagnoses  Name Primary?  . Venous stasis dermatitis of left lower extremity Yes  . Diabetic autonomic neuropathy associated with other specified diabetes mellitus ([place])   . Chronic left shoulder pain       MANAGEMENT   Recommend topical arthritis cream for multiple areas of her body that hurt continue the Ultram and gabapentin  Continue stockings and elevation  Follow-up as needed  No orders of the defined types were placed in this encounter.     Arther Abbott, MD  03/22/2020 2:54 PM

## 2020-03-22 NOTE — Patient Instructions (Signed)
Use Aspercreme, for your foot and shoulder over the counter 2-3 times daily make sure you rub it in well each time you use it.   Stockings / elevation

## 2020-03-23 DIAGNOSIS — E1122 Type 2 diabetes mellitus with diabetic chronic kidney disease: Secondary | ICD-10-CM | POA: Diagnosis not present

## 2020-03-23 DIAGNOSIS — H548 Legal blindness, as defined in USA: Secondary | ICD-10-CM | POA: Diagnosis not present

## 2020-03-23 DIAGNOSIS — I129 Hypertensive chronic kidney disease with stage 1 through stage 4 chronic kidney disease, or unspecified chronic kidney disease: Secondary | ICD-10-CM | POA: Diagnosis not present

## 2020-03-23 DIAGNOSIS — I4891 Unspecified atrial fibrillation: Secondary | ICD-10-CM | POA: Diagnosis not present

## 2020-03-23 DIAGNOSIS — E1165 Type 2 diabetes mellitus with hyperglycemia: Secondary | ICD-10-CM | POA: Diagnosis not present

## 2020-03-23 DIAGNOSIS — N390 Urinary tract infection, site not specified: Secondary | ICD-10-CM | POA: Diagnosis not present

## 2020-03-28 DIAGNOSIS — E1122 Type 2 diabetes mellitus with diabetic chronic kidney disease: Secondary | ICD-10-CM | POA: Diagnosis not present

## 2020-03-28 DIAGNOSIS — H548 Legal blindness, as defined in USA: Secondary | ICD-10-CM | POA: Diagnosis not present

## 2020-03-28 DIAGNOSIS — I4891 Unspecified atrial fibrillation: Secondary | ICD-10-CM | POA: Diagnosis not present

## 2020-03-28 DIAGNOSIS — E1165 Type 2 diabetes mellitus with hyperglycemia: Secondary | ICD-10-CM | POA: Diagnosis not present

## 2020-03-28 DIAGNOSIS — I129 Hypertensive chronic kidney disease with stage 1 through stage 4 chronic kidney disease, or unspecified chronic kidney disease: Secondary | ICD-10-CM | POA: Diagnosis not present

## 2020-03-28 DIAGNOSIS — N390 Urinary tract infection, site not specified: Secondary | ICD-10-CM | POA: Diagnosis not present

## 2020-03-30 ENCOUNTER — Other Ambulatory Visit: Payer: Self-pay | Admitting: Orthopedic Surgery

## 2020-03-30 DIAGNOSIS — E1343 Other specified diabetes mellitus with diabetic autonomic (poly)neuropathy: Secondary | ICD-10-CM

## 2020-04-05 DIAGNOSIS — E1122 Type 2 diabetes mellitus with diabetic chronic kidney disease: Secondary | ICD-10-CM | POA: Diagnosis not present

## 2020-04-05 DIAGNOSIS — E1165 Type 2 diabetes mellitus with hyperglycemia: Secondary | ICD-10-CM | POA: Diagnosis not present

## 2020-04-05 DIAGNOSIS — I4891 Unspecified atrial fibrillation: Secondary | ICD-10-CM | POA: Diagnosis not present

## 2020-04-05 DIAGNOSIS — N390 Urinary tract infection, site not specified: Secondary | ICD-10-CM | POA: Diagnosis not present

## 2020-04-05 DIAGNOSIS — I129 Hypertensive chronic kidney disease with stage 1 through stage 4 chronic kidney disease, or unspecified chronic kidney disease: Secondary | ICD-10-CM | POA: Diagnosis not present

## 2020-04-05 DIAGNOSIS — H548 Legal blindness, as defined in USA: Secondary | ICD-10-CM | POA: Diagnosis not present

## 2020-04-10 ENCOUNTER — Other Ambulatory Visit: Payer: Self-pay

## 2020-04-10 ENCOUNTER — Emergency Department (HOSPITAL_COMMUNITY): Payer: Medicare Other

## 2020-04-10 ENCOUNTER — Encounter (HOSPITAL_COMMUNITY): Payer: Self-pay | Admitting: Emergency Medicine

## 2020-04-10 ENCOUNTER — Emergency Department (HOSPITAL_COMMUNITY)
Admission: EM | Admit: 2020-04-10 | Discharge: 2020-04-10 | Disposition: A | Discharge status: Home / Self Care | Payer: Medicare Other | Attending: Emergency Medicine | Admitting: Emergency Medicine

## 2020-04-10 DIAGNOSIS — Z79899 Other long term (current) drug therapy: Secondary | ICD-10-CM | POA: Insufficient documentation

## 2020-04-10 DIAGNOSIS — Y9389 Activity, other specified: Secondary | ICD-10-CM | POA: Insufficient documentation

## 2020-04-10 DIAGNOSIS — Y998 Other external cause status: Secondary | ICD-10-CM | POA: Insufficient documentation

## 2020-04-10 DIAGNOSIS — E1165 Type 2 diabetes mellitus with hyperglycemia: Secondary | ICD-10-CM | POA: Diagnosis not present

## 2020-04-10 DIAGNOSIS — R238 Other skin changes: Secondary | ICD-10-CM

## 2020-04-10 DIAGNOSIS — Y9289 Other specified places as the place of occurrence of the external cause: Secondary | ICD-10-CM | POA: Insufficient documentation

## 2020-04-10 DIAGNOSIS — N183 Chronic kidney disease, stage 3 unspecified: Secondary | ICD-10-CM | POA: Insufficient documentation

## 2020-04-10 DIAGNOSIS — S50852A Superficial foreign body of left forearm, initial encounter: Secondary | ICD-10-CM | POA: Diagnosis not present

## 2020-04-10 DIAGNOSIS — I129 Hypertensive chronic kidney disease with stage 1 through stage 4 chronic kidney disease, or unspecified chronic kidney disease: Secondary | ICD-10-CM | POA: Diagnosis not present

## 2020-04-10 DIAGNOSIS — W458XXA Other foreign body or object entering through skin, initial encounter: Secondary | ICD-10-CM | POA: Insufficient documentation

## 2020-04-10 DIAGNOSIS — M19032 Primary osteoarthritis, left wrist: Secondary | ICD-10-CM | POA: Diagnosis not present

## 2020-04-10 DIAGNOSIS — J449 Chronic obstructive pulmonary disease, unspecified: Secondary | ICD-10-CM | POA: Insufficient documentation

## 2020-04-10 DIAGNOSIS — S59912A Unspecified injury of left forearm, initial encounter: Secondary | ICD-10-CM | POA: Diagnosis not present

## 2020-04-10 NOTE — ED Provider Notes (Signed)
  Face-to-face evaluation   History: She presents for concern of foreign body in left arm, possibly piece of plastic present for 2 years.  She felt like it popped out yesterday and is concerned that something is still in there.  Physical exam: Alert elderly female who is calm comfortable.  Left dorsal forearm with small raised area, without open wound, drainage or focal tenderness.  No palpable foreign body.  X-ray reviewed, no visible foreign body.   Medical screening examination/treatment/procedure(s) were conducted as a shared visit with non-physician practitioner(s) and myself.  I personally evaluated the patient during the encounter    Daleen Bo, MD 04/10/20 2023

## 2020-04-10 NOTE — ED Provider Notes (Signed)
[facility] Provider Note   CSN: [id number] Arrival date & time: 04/10/20  1658     History Chief Complaint  Patient presents with  . Arm Pain    left    [name] is a 86 y.o. female.  HPI     [name] is a 86 y.o. female who presents to the Emergency Department requesting evaluation for possible foreign body of her mid left forearm.  She states that she had a piece of plastic that was embedded in her skin from an injury that occurred 2 years ago.  She states the area opened up last evening and she believes that she saw the piece of plastic but was unable to remove it.  She states the area is "sore" when touched.  She denies redness, swelling, drainage, fever or chills.  No numbness or weakness of her arm.   Past Medical History:  Diagnosis Date  . Acid reflux   . Anxiety   . CKD (chronic kidney disease)   . COPD (chronic obstructive pulmonary disease) ([place])   . Depressed   . Diabetes mellitus   . Glaucoma   . Hypercholesteremia   . Hypertension   . Legally blind in right eye, as defined in [place]   . Panic attacks   . Type 2 diabetes mellitus [facility])     Patient Active Problem List   Diagnosis Date Noted  . Osteoarthritis of finger of left hand 11/06/2017  . Choledocholithiasis   . Common bile duct dilatation 07/14/2017  . Essential hypertension 07/14/2017  . Elevated LFTs 07/14/2017  . CKD (chronic kidney disease) stage 3, GFR 30-59 ml/min 07/14/2017  . CAP (community acquired pneumonia) 06/30/2017  . Hypercholesteremia 06/30/2017  . Glaucoma 06/30/2017  . Anxiety 06/30/2017  . Acid reflux 06/30/2017  . Primary open angle glaucoma of both eyes, severe stage 06/03/2013  . Other and combined forms of senile cataract 09/10/2011  . Osteoarthritis 07/21/2011  . Altered mental status 07/21/2011  . COPD (chronic obstructive pulmonary disease) ([place]) 07/21/2011  . UTI (lower urinary tract infection) 07/21/2011  . Heart failure ([place])  07/09/2011  . Hyperkalemia 07/09/2011  . Type 2 diabetes mellitus with hyperglycemia ([place]) 07/09/2011  . Cataract 06/04/2011  . TOTAL HIP FOLLOW-UP 04/14/2008  . HIP PAIN 03/17/2008  . SCIATICA 03/17/2008  . CUBITAL TUNNEL SYNDROME 02/10/2008  . KNEE, ARTHRITIS, DEGEN./OSTEO 02/10/2008  . KNEE PAIN 02/10/2008  . NECK PAIN 12/24/2007  . SHOULDER PAIN 11/17/2007  . RUPTURE ROTATOR CUFF 11/17/2007  . Type 2 diabetes mellitus ([place]) 05/13/2007    Past Surgical History:  Procedure Laterality Date  . APPENDECTOMY    . BALLOON DILATION  07/16/2017   Procedure: BALLOON DILATION;  Surgeon: Danie Binder, MD;  [facility];  Service: Endoscopy;;  . cataracts    . CHOLECYSTECTOMY    . ERCP N/A 07/16/2017   biliary papillary stenosis, filling defect c/w a stone and sludge, entire biliary tree dilated. completed biliary sphincterotomy and balloon extraction, stone removal  . ESOPHAGOGASTRODUODENOSCOPY  2006   Dr. Gala Romney: normal esophagus s/p empiric dilataton, small hiatal hernia  . HIP SURGERY     rod in leg as well  . MASS EXCISION Left 09/26/2017   Procedure: EXCISION MUCOID CYST WITH DISTAL INTERPHALANGEAL JOINT ARTHROTOMY OF LEFT MIDDLE FINGER;  Surgeon: Leanora Cover, MD;  Location: [facility];  Service: Orthopedics;  Laterality: Left;  . SPHINCTEROTOMY  07/16/2017   Procedure: SPHINCTEROTOMY;  Surgeon: Barney Drain  L, MD;  [facility];  Service: Endoscopy;;  . TUBAL LIGATION       OB History   No obstetric history on file.     Family History  Problem Relation Age of Onset  . Blindness Father   . Bone cancer Father   . Brain cancer Sister   . Alzheimer's disease Brother   . Colon cancer Neg Hx     Social History   Tobacco Use  . Smoking status: Never Smoker  . Smokeless tobacco: Never Used  Vaping Use  . Vaping Use: Never used  Substance Use Topics  . Alcohol use: No  . Drug use: No    Home Medications Prior to Admission  medications   Medication Sig Start Date End Date Taking? Authorizing Provider  gabapentin (NEURONTIN) 100 MG capsule TAKE 1 CAPSULE BY MOUTH THREE TIMES A DAY 03/31/20   Carole Civil, MD  amLODipine (NORVASC) 5 MG tablet Take 5 mg by mouth daily.      [provider]  busPIRone (BUSPAR) 10 MG tablet  09/01/18   [provider]  Difluprednate ([place] OP) Apply to eye daily.     [provider]  furosemide (LASIX) 20 MG tablet Take 20 mg daily as needed by mouth for fluid.    [provider]  glipiZIDE (GLUCOTROL) 5 MG tablet Take 5 mg 2 (two) times daily by mouth.     [provider]  LOKELMA 10 g PACK packet Take 1 packet by mouth daily. 03/17/20   [provider]  omeprazole (PRILOSEC) 20 MG capsule Take 20 mg by mouth daily.      [provider]  pravastatin (PRAVACHOL) 40 MG tablet Take 40 mg by mouth daily.      [provider]  traMADol (ULTRAM) 50 MG tablet Take 1 tablet (50 mg total) by mouth every 12 (twelve) hours as needed for severe pain. 10/13/19   [place], [place], PA-C  TRESIBA FLEXTOUCH 100 UNIT/ML FlexTouch Pen  12/08/19   [provider]  XARELTO 15 MG TABS tablet Take 15 mg by mouth daily. 12/01/19   [provider]    Allergies    Hydromorphone hcl  Review of Systems   Review of Systems  Constitutional: Negative for chills and fever.  Musculoskeletal: Positive for arthralgias. Negative for joint swelling.  Skin: Positive for wound. Negative for color change.       Foreign body of the skin to the left forearm  Neurological: Negative for dizziness, weakness and numbness.    Physical Exam Updated Vital Signs BP 139/87   Pulse 79   Temp 98.5 F (36.9 C) (Oral)   Resp 16   Ht 5\' 3"  (1.6 m)   Wt 78.5 kg   SpO2 94%   BMI 30.65 kg/m   Physical Exam Vitals and nursing note reviewed.  Constitutional:      General: She is not in acute distress.    Appearance: Normal appearance.   Cardiovascular:     Rate and Rhythm: Normal rate and regular rhythm.     Pulses: Normal pulses.  Pulmonary:     Effort: Pulmonary effort is normal.     Breath sounds: Normal breath sounds.  Skin:    General: Skin is warm.     Capillary Refill: Capillary refill takes less than 2 seconds.     Comments: 2 to 3 mm scab to the mid left forearm.  No visual or palpable foreign body.  No edema,  drainage or erythema.  Neurological:     General: No focal deficit present.     Mental Status: She is alert.     ED Results / Procedures / Treatments   Labs (all labs ordered are listed, but only abnormal results are displayed) Labs Reviewed - No data to display  EKG None  Radiology DG Forearm Left  Result Date: 04/10/2020 CLINICAL DATA:  Possible foreign body left forearm from injury 2 years ago. EXAM: LEFT FOREARM - 2 VIEW COMPARISON:  None. FINDINGS: There is diffuse decreased bone mineralization. Mild degenerate changes over the radiocarpal joint and carpal bones. No focal bony abnormality. No fracture or dislocation. No radiopaque foreign body. IMPRESSION: Negative. Electronically Signed   By: Marin Olp M.D.   On: 04/10/2020 17:50    Procedures Procedures (including critical care time)  Medications Ordered in ED Medications - No data to display  ED Course  I have reviewed the triage vital signs and the nursing notes.  Pertinent labs & imaging results that were available during my care of the patient were reviewed by me and considered in my medical decision making (see chart for details).    MDM Rules/Calculators/A&P                          Patient well-appearing.  Nontoxic.  Vital signs reviewed.  Here with complaint of possible foreign body of the skin.  Neurovascularly intact.  Scab was removed and no foreign body palpated or seen with magnification.  Possible excoriated actinic keratoses.  X-ray negative for foreign body.  Patient reassured.   Final Clinical Impression(s)  / ED Diagnoses Final diagnoses:  Skin irritation    Rx / DC Orders ED Discharge Orders    None       Bufford Lope 04/10/20 2023    Daleen Bo, MD 04/10/20 [phone]

## 2020-04-10 NOTE — Discharge Instructions (Addendum)
Your x-ray and exam did not show evidence of a foreign body in the skin.  Clean the area with mild soap and water.  Keep it bandaged.  Follow-up with your primary doctor or with a dermatologist.

## 2020-04-10 NOTE — ED Triage Notes (Signed)
Pt reports having plastic in her left arm about 2 years ago.  Pt reports the plastic in left forearm.  Rates pain 9/10, soreness.

## 2020-04-10 NOTE — ED Notes (Signed)
ED Provider at bedside. 

## 2020-05-04 DIAGNOSIS — N189 Chronic kidney disease, unspecified: Secondary | ICD-10-CM | POA: Diagnosis not present

## 2020-05-04 DIAGNOSIS — E1122 Type 2 diabetes mellitus with diabetic chronic kidney disease: Secondary | ICD-10-CM | POA: Diagnosis not present

## 2020-05-04 DIAGNOSIS — E211 Secondary hyperparathyroidism, not elsewhere classified: Secondary | ICD-10-CM | POA: Diagnosis not present

## 2020-05-04 DIAGNOSIS — E1129 Type 2 diabetes mellitus with other diabetic kidney complication: Secondary | ICD-10-CM | POA: Diagnosis not present

## 2020-05-04 DIAGNOSIS — R809 Proteinuria, unspecified: Secondary | ICD-10-CM | POA: Diagnosis not present

## 2020-05-04 DIAGNOSIS — E875 Hyperkalemia: Secondary | ICD-10-CM | POA: Diagnosis not present

## 2020-05-12 ENCOUNTER — Emergency Department (HOSPITAL_COMMUNITY): Payer: Medicare Other

## 2020-05-12 ENCOUNTER — Other Ambulatory Visit: Payer: Self-pay

## 2020-05-12 ENCOUNTER — Emergency Department (HOSPITAL_COMMUNITY)
Admission: EM | Admit: 2020-05-12 | Discharge: 2020-05-12 | Disposition: A | Discharge status: Home / Self Care | Payer: Medicare Other | Attending: Emergency Medicine | Admitting: Emergency Medicine

## 2020-05-12 ENCOUNTER — Encounter (HOSPITAL_COMMUNITY): Payer: Self-pay | Admitting: Emergency Medicine

## 2020-05-12 DIAGNOSIS — I10 Essential (primary) hypertension: Secondary | ICD-10-CM | POA: Diagnosis not present

## 2020-05-12 DIAGNOSIS — J449 Chronic obstructive pulmonary disease, unspecified: Secondary | ICD-10-CM | POA: Insufficient documentation

## 2020-05-12 DIAGNOSIS — Z209 Contact with and (suspected) exposure to unspecified communicable disease: Secondary | ICD-10-CM | POA: Diagnosis not present

## 2020-05-12 DIAGNOSIS — Z7984 Long term (current) use of oral hypoglycemic drugs: Secondary | ICD-10-CM | POA: Diagnosis not present

## 2020-05-12 DIAGNOSIS — U071 COVID-19: Secondary | ICD-10-CM | POA: Diagnosis not present

## 2020-05-12 DIAGNOSIS — R0602 Shortness of breath: Secondary | ICD-10-CM | POA: Diagnosis not present

## 2020-05-12 DIAGNOSIS — I129 Hypertensive chronic kidney disease with stage 1 through stage 4 chronic kidney disease, or unspecified chronic kidney disease: Secondary | ICD-10-CM | POA: Insufficient documentation

## 2020-05-12 DIAGNOSIS — N183 Chronic kidney disease, stage 3 unspecified: Secondary | ICD-10-CM | POA: Insufficient documentation

## 2020-05-12 DIAGNOSIS — I517 Cardiomegaly: Secondary | ICD-10-CM | POA: Diagnosis not present

## 2020-05-12 DIAGNOSIS — E1165 Type 2 diabetes mellitus with hyperglycemia: Secondary | ICD-10-CM | POA: Insufficient documentation

## 2020-05-12 DIAGNOSIS — Z79899 Other long term (current) drug therapy: Secondary | ICD-10-CM | POA: Insufficient documentation

## 2020-05-12 DIAGNOSIS — R509 Fever, unspecified: Secondary | ICD-10-CM | POA: Diagnosis not present

## 2020-05-12 DIAGNOSIS — R069 Unspecified abnormalities of breathing: Secondary | ICD-10-CM | POA: Diagnosis not present

## 2020-05-12 DIAGNOSIS — E1122 Type 2 diabetes mellitus with diabetic chronic kidney disease: Secondary | ICD-10-CM | POA: Insufficient documentation

## 2020-05-12 DIAGNOSIS — Z23 Encounter for immunization: Secondary | ICD-10-CM | POA: Diagnosis not present

## 2020-05-12 LAB — COMPREHENSIVE METABOLIC PANEL
ALT: 10 U/L (ref 0–44)
AST: 18 U/L (ref 15–41)
Albumin: 4.2 g/dL (ref 3.5–5.0)
Alkaline Phosphatase: 94 U/L (ref 38–126)
Anion gap: 10 (ref 5–15)
BUN: 35 mg/dL — ABNORMAL HIGH (ref 8–23)
CO2: 24 mmol/L (ref 22–32)
Calcium: 8.6 mg/dL — ABNORMAL LOW (ref 8.9–10.3)
Chloride: 102 mmol/L (ref 98–111)
Creatinine, Ser: 1.77 mg/dL — ABNORMAL HIGH (ref 0.44–1.00)
GFR calc Af Amer: 29 mL/min — ABNORMAL LOW (ref >60)
GFR calc non Af Amer: 25 mL/min — ABNORMAL LOW (ref >60)
Glucose, Bld: 147 mg/dL — ABNORMAL HIGH (ref 70–99)
Potassium: 4 mmol/L (ref 3.5–5.1)
Sodium: 136 mmol/L (ref 135–145)
Total Bilirubin: 0.5 mg/dL (ref 0.3–1.2)
Total Protein: 7.6 g/dL (ref 6.5–8.1)

## 2020-05-12 LAB — CBC WITH DIFFERENTIAL/PLATELET
Abs Immature Granulocytes: 0.03 10*3/uL (ref 0.00–0.07)
Basophils Absolute: 0.1 10*3/uL (ref 0.0–0.1)
Basophils Relative: 1 %
Eosinophils Absolute: 0.1 10*3/uL (ref 0.0–0.5)
Eosinophils Relative: 1 %
HCT: 44 % (ref 36.0–46.0)
Hemoglobin: 14.3 g/dL (ref 12.0–15.0)
Immature Granulocytes: 0 %
Lymphocytes Relative: 13 %
Lymphs Abs: 1 10*3/uL (ref 0.7–4.0)
MCH: 30.4 pg (ref 26.0–34.0)
MCHC: 32.5 g/dL (ref 30.0–36.0)
MCV: 93.6 fL (ref 80.0–100.0)
Monocytes Absolute: 0.7 10*3/uL (ref 0.1–1.0)
Monocytes Relative: 9 %
Neutro Abs: 5.6 10*3/uL (ref 1.7–7.7)
Neutrophils Relative %: 76 %
Platelets: 265 10*3/uL (ref 150–400)
RBC: 4.7 MIL/uL (ref 3.87–5.11)
RDW: 14.3 % (ref 11.5–15.5)
WBC: 7.4 10*3/uL (ref 4.0–10.5)
nRBC: 0 % (ref 0.0–0.2)

## 2020-05-12 LAB — SARS CORONAVIRUS 2 BY RT PCR (HOSPITAL ORDER, PERFORMED IN ~~LOC~~ HOSPITAL LAB): SARS Coronavirus 2: POSITIVE — AB

## 2020-05-12 MED ORDER — SODIUM CHLORIDE 0.9 % IV SOLN: INTRAVENOUS | Status: DC | PRN

## 2020-05-12 MED ORDER — ALBUTEROL SULFATE HFA 108 (90 BASE) MCG/ACT IN AERS
2.0000 | INHALATION_SPRAY | Freq: Once | RESPIRATORY_TRACT | Status: AC
  Administered 2020-05-12: 2 via RESPIRATORY_TRACT
  Filled 2020-05-12: qty 6.7

## 2020-05-12 MED ORDER — METHYLPREDNISOLONE SODIUM SUCC 125 MG IJ SOLR: 125.0000 mg | Freq: Once | INTRAMUSCULAR | Status: DC | PRN

## 2020-05-12 MED ORDER — DIPHENHYDRAMINE HCL 50 MG/ML IJ SOLN: 50.0000 mg | Freq: Once | INTRAMUSCULAR | Status: DC | PRN

## 2020-05-12 MED ORDER — SODIUM CHLORIDE 0.9 % IV SOLN
1200.0000 mg | Freq: Once | INTRAVENOUS | Status: AC
  Administered 2020-05-12: 1200 mg via INTRAVENOUS
  Filled 2020-05-12: qty 10

## 2020-05-12 MED ORDER — FAMOTIDINE IN NACL 20-0.9 MG/50ML-% IV SOLN: 20.0000 mg | Freq: Once | INTRAVENOUS | Status: DC | PRN

## 2020-05-12 MED ORDER — ALBUTEROL SULFATE HFA 108 (90 BASE) MCG/ACT IN AERS: 2.0000 | INHALATION_SPRAY | Freq: Once | RESPIRATORY_TRACT | Status: DC | PRN

## 2020-05-12 MED ORDER — EPINEPHRINE 0.3 MG/0.3ML IJ SOAJ: 0.3000 mg | Freq: Once | INTRAMUSCULAR | Status: DC | PRN

## 2020-05-12 NOTE — ED Provider Notes (Signed)
[facility] EMERGENCY DEPARTMENT Provider Note   CSN: [id number] Arrival date & time: 05/12/20  0120     History Chief Complaint  Patient presents with   Fever    [name] is a 86 y.o. female.  Thermometer Given her different readings her pulse ox Given her different readings none of them were the same and she was unsure.  She had a bit of a cough thought she might have bronchitis so came here for further evaluation.  Apparently one of her children also has Covid she has been exposed but not significantly. No pain. No other complaints.    Fever Onset quality:  Gradual Duration:  2 days Timing:  Intermittent Progression:  Unchanged Chronicity:  Recurrent Relieved by:  None tried Worsened by:  Nothing Ineffective treatments:  None tried Associated symptoms: cough   Cough Associated symptoms: fever        Past Medical History:  Diagnosis Date   Acid reflux    Anxiety    CKD (chronic kidney disease)    COPD (chronic obstructive pulmonary disease) ([place])    Depressed    Diabetes mellitus    Glaucoma    Hypercholesteremia    Hypertension    Legally blind in right eye, as defined in [place]    Panic attacks    Type 2 diabetes mellitus ([place])     Patient Active Problem List   Diagnosis Date Noted   Osteoarthritis of finger of left hand 11/06/2017   Choledocholithiasis    Common bile duct dilatation 07/14/2017   Essential hypertension 07/14/2017   Elevated LFTs 07/14/2017   CKD (chronic kidney disease) stage 3, GFR 30-59 ml/min 07/14/2017   CAP (community acquired pneumonia) 06/30/2017   Hypercholesteremia 06/30/2017   Glaucoma 06/30/2017   Anxiety 06/30/2017   Acid reflux 06/30/2017   Primary open angle glaucoma of both eyes, severe stage 06/03/2013   Other and combined forms of senile cataract 09/10/2011   Osteoarthritis 07/21/2011   Altered mental status 07/21/2011   COPD (chronic obstructive pulmonary disease) ([place])  07/21/2011   UTI (lower urinary tract infection) 07/21/2011   Heart failure ([place]) 07/09/2011   Hyperkalemia 07/09/2011   Type 2 diabetes mellitus with hyperglycemia ([place]) 07/09/2011   Cataract 06/04/2011   TOTAL HIP FOLLOW-UP 04/14/2008   HIP PAIN 03/17/2008   SCIATICA 03/17/2008   CUBITAL TUNNEL SYNDROME 02/10/2008   KNEE, ARTHRITIS, DEGEN./OSTEO 02/10/2008   KNEE PAIN 02/10/2008   NECK PAIN 12/24/2007   SHOULDER PAIN 11/17/2007   RUPTURE ROTATOR CUFF 11/17/2007   Type 2 diabetes mellitus ([place]) 05/13/2007    Past Surgical History:  Procedure Laterality Date   APPENDECTOMY     BALLOON DILATION  07/16/2017   Procedure: BALLOON DILATION;  Surgeon: Danie Binder, MD;  Location[facility];  Service: Endoscopy;;   cataracts     CHOLECYSTECTOMY     ERCP N/A 07/16/2017   biliary papillary stenosis, filling defect c/w a stone and sludge, entire biliary tree dilated. completed biliary sphincterotomy and balloon extraction, stone removal   ESOPHAGOGASTRODUODENOSCOPY  2006   Dr. Gala Romney: normal esophagus s/p empiric dilataton, small hiatal hernia   HIP SURGERY     rod in leg as well   MASS EXCISION Left 09/26/2017   Procedure: EXCISION MUCOID CYST WITH DISTAL INTERPHALANGEAL JOINT ARTHROTOMY OF LEFT MIDDLE FINGER;  Surgeon: Leanora Cover, MD;  Location: [place];  Service: Orthopedics;  Laterality: Left;   SPHINCTEROTOMY  07/16/2017   Procedure: SPHINCTEROTOMY;  Surgeon: Oneida Alar,  Marga Melnick, MD;  Location: [facility];  Service: Endoscopy;;   TUBAL LIGATION       OB History   No obstetric history on file.     Family History  Problem Relation Age of Onset   Blindness Father    Bone cancer Father    Brain cancer Sister    Alzheimer's disease Brother    Colon cancer Neg Hx     Social History   Tobacco Use   Smoking status: Never Smoker   Smokeless tobacco: Never Used  Vaping Use   Vaping Use: Never used  Substance Use  Topics   Alcohol use: No   Drug use: No    Home Medications Prior to Admission medications   Medication Sig Start Date End Date Taking? Authorizing Provider  gabapentin (NEURONTIN) 100 MG capsule TAKE 1 CAPSULE BY MOUTH THREE TIMES A DAY 03/31/20   Carole Civil, MD  amLODipine (NORVASC) 5 MG tablet Take 5 mg by mouth daily.      [provider]  busPIRone (BUSPAR) 10 MG tablet  09/01/18   [provider]  Difluprednate ([place] [facility]) Apply to eye daily.     [provider]  furosemide (LASIX) 20 MG tablet Take 20 mg daily as needed by mouth for fluid.    [provider]  glipiZIDE (GLUCOTROL) 5 MG tablet Take 5 mg 2 (two) times daily by mouth.     [provider]  LOKELMA 10 g PACK packet Take 1 packet by mouth daily. 03/17/20   [provider]  omeprazole (PRILOSEC) 20 MG capsule Take 20 mg by mouth daily.      [provider]  pravastatin (PRAVACHOL) 40 MG tablet Take 40 mg by mouth daily.      [provider]  traMADol (ULTRAM) 50 MG tablet Take 1 tablet (50 mg total) by mouth every 12 (twelve) hours as needed for severe pain. 10/13/19   [place], [place], PA-C  TRESIBA FLEXTOUCH 100 UNIT/ML FlexTouch Pen  12/08/19   [provider]  XARELTO 15 MG TABS tablet Take 15 mg by mouth daily. 12/01/19   [provider]    Allergies    Hydromorphone hcl  Review of Systems   Review of Systems  Constitutional: Positive for fever.  Respiratory: Positive for cough.   All other systems reviewed and are negative.   Physical Exam Updated Vital Signs BP 121/78 (BP Location: Left Arm)    Pulse 80    Temp 98.3 F (36.8 C) (Oral)    Resp 19    SpO2 96%   Physical Exam Vitals and nursing note reviewed.  Constitutional:      Appearance: She is well-developed.  HENT:     Head: Normocephalic and atraumatic.     Nose: No congestion or rhinorrhea.     Mouth/Throat:     Mouth: Mucous membranes are moist.      Pharynx: Oropharynx is clear.  Eyes:     Pupils: Pupils are equal, round, and reactive to light.  Cardiovascular:     Rate and Rhythm: Normal rate and regular rhythm.  Pulmonary:     Effort: No respiratory distress.     Breath sounds: No stridor.  Abdominal:     General: There is no distension.  Musculoskeletal:        General: No swelling or deformity.     Cervical back: Normal range of motion.  Skin:    General: Skin is warm and dry.  Neurological:     General: No focal deficit present.     Mental Status: She is alert.     ED Results / Procedures / Treatments   Labs (all labs ordered are listed, but only abnormal results are displayed) Labs Reviewed  SARS CORONAVIRUS 2 BY RT PCR ([facility]) - Abnormal; Notable for the following components:      Result Value   SARS Coronavirus 2 POSITIVE (*)    All other components within normal limits  COMPREHENSIVE METABOLIC PANEL - Abnormal; Notable for the following components:   Glucose, Bld 147 (*)    BUN 35 (*)    Creatinine, Ser 1.77 (*)    Calcium 8.6 (*)    GFR calc non Af Amer 25 (*)    GFR calc Af Amer 29 (*)    All other components within normal limits  CBC WITH DIFFERENTIAL/PLATELET    EKG None  Radiology DG Chest Portable 1 View  Result Date: 05/12/2020 CLINICAL DATA:  Shortness of breath and fever EXAM: PORTABLE CHEST 1 VIEW COMPARISON:  06/30/2017 FINDINGS: No focal airspace disease or effusion. Cardiomegaly with aortic atherosclerosis. No pneumothorax. IMPRESSION: No active disease. Cardiomegaly. Electronically Signed   By: Donavan Foil M.D.   On: 05/12/2020 02:24    Procedures Procedures (including critical care time)  Medications Ordered in ED Medications  0.9 %  sodium chloride infusion (has no administration in time range)  diphenhydrAMINE (BENADRYL) injection 50 mg (has no administration in time range)  famotidine (PEPCID) IVPB 20 mg premix (has no  administration in time range)  methylPREDNISolone sodium succinate (SOLU-MEDROL) 125 mg/2 mL injection 125 mg (has no administration in time range)  albuterol (VENTOLIN HFA) 108 (90 Base) MCG/ACT inhaler 2 puff (has no administration in time range)  EPINEPHrine (EPI-PEN) injection 0.3 mg (has no administration in time range)  albuterol (VENTOLIN HFA) 108 (90 Base) MCG/ACT inhaler 2 puff (2 puffs Inhalation Given 05/12/20 0154)  casirivimab-imdevimab (REGEN-COV) 1,200 mg in sodium chloride 0.9 % 110 mL IVPB (0 mg Intravenous Stopped 05/12/20 0430)    ED Course  I have reviewed the triage vital signs and the nursing notes.  Pertinent labs & imaging results that were available during my care of the patient were reviewed by me and considered in my medical decision making (see chart for details).    MDM Rules/Calculators/A&P                         State she usually gets bronchitis and thinks that is possibly what this is. Afebrile here.luings mostly ok. Will eval for PNA/labs/covid.   COVID. otherwise appears well. MAB 2/2 comorbidities.    Final Clinical Impression(s) / ED Diagnoses Final diagnoses:  [id number]    Rx / DC Orders ED Discharge Orders    None       Gloyd Happ, Corene Cornea, MD 05/12/20 [phone]

## 2020-05-12 NOTE — ED Notes (Signed)
Date and time results received: 05/12/20 0257   Test: COVID Critical Value: POSITIVE  Name of Provider Notified: [place], MD

## 2020-05-12 NOTE — ED Triage Notes (Signed)
Pt from home via [facility]. Pt states her "oxygen kept going up and down on my meter at home." Pt reports her grandson was Dx with COVID and she has been in contact with him.

## 2020-05-13 DIAGNOSIS — U071 COVID-19: Secondary | ICD-10-CM | POA: Diagnosis not present

## 2020-05-18 DIAGNOSIS — R300 Dysuria: Secondary | ICD-10-CM | POA: Diagnosis not present

## 2020-05-18 DIAGNOSIS — U071 COVID-19: Secondary | ICD-10-CM | POA: Diagnosis not present

## 2020-05-19 DIAGNOSIS — J449 Chronic obstructive pulmonary disease, unspecified: Secondary | ICD-10-CM | POA: Diagnosis not present

## 2020-05-19 DIAGNOSIS — K219 Gastro-esophageal reflux disease without esophagitis: Secondary | ICD-10-CM | POA: Diagnosis not present

## 2020-05-19 DIAGNOSIS — E1165 Type 2 diabetes mellitus with hyperglycemia: Secondary | ICD-10-CM | POA: Diagnosis not present

## 2020-05-19 DIAGNOSIS — I4891 Unspecified atrial fibrillation: Secondary | ICD-10-CM | POA: Diagnosis not present

## 2020-05-19 DIAGNOSIS — F419 Anxiety disorder, unspecified: Secondary | ICD-10-CM | POA: Diagnosis not present

## 2020-05-19 DIAGNOSIS — N184 Chronic kidney disease, stage 4 (severe): Secondary | ICD-10-CM | POA: Diagnosis not present

## 2020-05-19 DIAGNOSIS — N390 Urinary tract infection, site not specified: Secondary | ICD-10-CM | POA: Diagnosis not present

## 2020-05-19 DIAGNOSIS — I129 Hypertensive chronic kidney disease with stage 1 through stage 4 chronic kidney disease, or unspecified chronic kidney disease: Secondary | ICD-10-CM | POA: Diagnosis not present

## 2020-05-19 DIAGNOSIS — H548 Legal blindness, as defined in USA: Secondary | ICD-10-CM | POA: Diagnosis not present

## 2020-05-19 DIAGNOSIS — E785 Hyperlipidemia, unspecified: Secondary | ICD-10-CM | POA: Diagnosis not present

## 2020-05-19 DIAGNOSIS — E1122 Type 2 diabetes mellitus with diabetic chronic kidney disease: Secondary | ICD-10-CM | POA: Diagnosis not present

## 2020-05-19 DIAGNOSIS — M25572 Pain in left ankle and joints of left foot: Secondary | ICD-10-CM | POA: Diagnosis not present

## 2020-06-12 ENCOUNTER — Emergency Department (HOSPITAL_COMMUNITY): Payer: Medicare Other

## 2020-06-12 ENCOUNTER — Inpatient Hospital Stay (HOSPITAL_COMMUNITY)
Admission: EM | Admit: 2020-06-12 | Discharge: 2020-06-16 | DRG: 481 | Disposition: A | Discharge status: Home Health | Payer: Medicare Other | Attending: Internal Medicine | Admitting: Internal Medicine

## 2020-06-12 DIAGNOSIS — I5032 Chronic diastolic (congestive) heart failure: Secondary | ICD-10-CM | POA: Diagnosis not present

## 2020-06-12 DIAGNOSIS — N1832 Chronic kidney disease, stage 3b: Secondary | ICD-10-CM | POA: Diagnosis not present

## 2020-06-12 DIAGNOSIS — Z820 Family history of epilepsy and other diseases of the nervous system: Secondary | ICD-10-CM | POA: Diagnosis not present

## 2020-06-12 DIAGNOSIS — I110 Hypertensive heart disease with heart failure: Secondary | ICD-10-CM | POA: Diagnosis not present

## 2020-06-12 DIAGNOSIS — S72142D Displaced intertrochanteric fracture of left femur, subsequent encounter for closed fracture with routine healing: Secondary | ICD-10-CM | POA: Diagnosis not present

## 2020-06-12 DIAGNOSIS — S72142A Displaced intertrochanteric fracture of left femur, initial encounter for closed fracture: Secondary | ICD-10-CM | POA: Diagnosis not present

## 2020-06-12 DIAGNOSIS — Z7901 Long term (current) use of anticoagulants: Secondary | ICD-10-CM

## 2020-06-12 DIAGNOSIS — E785 Hyperlipidemia, unspecified: Secondary | ICD-10-CM | POA: Diagnosis not present

## 2020-06-12 DIAGNOSIS — Z821 Family history of blindness and visual loss: Secondary | ICD-10-CM | POA: Diagnosis not present

## 2020-06-12 DIAGNOSIS — M545 Low back pain, unspecified: Secondary | ICD-10-CM | POA: Diagnosis not present

## 2020-06-12 DIAGNOSIS — M779 Enthesopathy, unspecified: Secondary | ICD-10-CM | POA: Diagnosis present

## 2020-06-12 DIAGNOSIS — E11649 Type 2 diabetes mellitus with hypoglycemia without coma: Secondary | ICD-10-CM | POA: Diagnosis present

## 2020-06-12 DIAGNOSIS — W19XXXA Unspecified fall, initial encounter: Secondary | ICD-10-CM | POA: Diagnosis not present

## 2020-06-12 DIAGNOSIS — E1165 Type 2 diabetes mellitus with hyperglycemia: Secondary | ICD-10-CM | POA: Diagnosis not present

## 2020-06-12 DIAGNOSIS — E1142 Type 2 diabetes mellitus with diabetic polyneuropathy: Secondary | ICD-10-CM | POA: Diagnosis present

## 2020-06-12 DIAGNOSIS — Z888 Allergy status to other drugs, medicaments and biological substances status: Secondary | ICD-10-CM

## 2020-06-12 DIAGNOSIS — E7800 Pure hypercholesterolemia, unspecified: Secondary | ICD-10-CM | POA: Diagnosis not present

## 2020-06-12 DIAGNOSIS — I482 Chronic atrial fibrillation, unspecified: Secondary | ICD-10-CM | POA: Diagnosis not present

## 2020-06-12 DIAGNOSIS — I10 Essential (primary) hypertension: Secondary | ICD-10-CM | POA: Diagnosis present

## 2020-06-12 DIAGNOSIS — Z7984 Long term (current) use of oral hypoglycemic drugs: Secondary | ICD-10-CM | POA: Diagnosis not present

## 2020-06-12 DIAGNOSIS — Z8616 Personal history of COVID-19: Secondary | ICD-10-CM | POA: Diagnosis not present

## 2020-06-12 DIAGNOSIS — R5381 Other malaise: Secondary | ICD-10-CM | POA: Diagnosis not present

## 2020-06-12 DIAGNOSIS — I509 Heart failure, unspecified: Secondary | ICD-10-CM | POA: Diagnosis not present

## 2020-06-12 DIAGNOSIS — I130 Hypertensive heart and chronic kidney disease with heart failure and stage 1 through stage 4 chronic kidney disease, or unspecified chronic kidney disease: Secondary | ICD-10-CM | POA: Diagnosis present

## 2020-06-12 DIAGNOSIS — Z419 Encounter for procedure for purposes other than remedying health state, unspecified: Secondary | ICD-10-CM

## 2020-06-12 DIAGNOSIS — Z79899 Other long term (current) drug therapy: Secondary | ICD-10-CM | POA: Diagnosis not present

## 2020-06-12 DIAGNOSIS — J449 Chronic obstructive pulmonary disease, unspecified: Secondary | ICD-10-CM | POA: Diagnosis present

## 2020-06-12 DIAGNOSIS — E559 Vitamin D deficiency, unspecified: Secondary | ICD-10-CM | POA: Diagnosis present

## 2020-06-12 DIAGNOSIS — M2550 Pain in unspecified joint: Secondary | ICD-10-CM | POA: Diagnosis not present

## 2020-06-12 DIAGNOSIS — E119 Type 2 diabetes mellitus without complications: Secondary | ICD-10-CM | POA: Diagnosis not present

## 2020-06-12 DIAGNOSIS — R52 Pain, unspecified: Secondary | ICD-10-CM

## 2020-06-12 DIAGNOSIS — S199XXA Unspecified injury of neck, initial encounter: Secondary | ICD-10-CM | POA: Diagnosis not present

## 2020-06-12 DIAGNOSIS — W010XXA Fall on same level from slipping, tripping and stumbling without subsequent striking against object, initial encounter: Secondary | ICD-10-CM | POA: Diagnosis not present

## 2020-06-12 DIAGNOSIS — F419 Anxiety disorder, unspecified: Secondary | ICD-10-CM | POA: Diagnosis present

## 2020-06-12 DIAGNOSIS — N183 Chronic kidney disease, stage 3 unspecified: Secondary | ICD-10-CM | POA: Diagnosis present

## 2020-06-12 DIAGNOSIS — M25562 Pain in left knee: Secondary | ICD-10-CM | POA: Diagnosis not present

## 2020-06-12 DIAGNOSIS — F410 Panic disorder [episodic paroxysmal anxiety] without agoraphobia: Secondary | ICD-10-CM | POA: Diagnosis present

## 2020-06-12 DIAGNOSIS — Z20822 Contact with and (suspected) exposure to covid-19: Secondary | ICD-10-CM | POA: Diagnosis present

## 2020-06-12 DIAGNOSIS — S0990XA Unspecified injury of head, initial encounter: Secondary | ICD-10-CM | POA: Diagnosis not present

## 2020-06-12 DIAGNOSIS — Z7401 Bed confinement status: Secondary | ICD-10-CM | POA: Diagnosis not present

## 2020-06-12 DIAGNOSIS — R0902 Hypoxemia: Secondary | ICD-10-CM | POA: Diagnosis not present

## 2020-06-12 DIAGNOSIS — H548 Legal blindness, as defined in USA: Secondary | ICD-10-CM | POA: Diagnosis present

## 2020-06-12 DIAGNOSIS — E1122 Type 2 diabetes mellitus with diabetic chronic kidney disease: Secondary | ICD-10-CM | POA: Diagnosis not present

## 2020-06-12 LAB — CBC WITH DIFFERENTIAL/PLATELET
Abs Immature Granulocytes: 0.04 10*3/uL (ref 0.00–0.07)
Basophils Absolute: 0.1 10*3/uL (ref 0.0–0.1)
Basophils Relative: 1 %
Eosinophils Absolute: 0.3 10*3/uL (ref 0.0–0.5)
Eosinophils Relative: 4 %
HCT: 39.8 % (ref 36.0–46.0)
Hemoglobin: 12.6 g/dL (ref 12.0–15.0)
Immature Granulocytes: 1 %
Lymphocytes Relative: 21 %
Lymphs Abs: 1.4 10*3/uL (ref 0.7–4.0)
MCH: 29.6 pg (ref 26.0–34.0)
MCHC: 31.7 g/dL (ref 30.0–36.0)
MCV: 93.4 fL (ref 80.0–100.0)
Monocytes Absolute: 0.4 10*3/uL (ref 0.1–1.0)
Monocytes Relative: 6 %
Neutro Abs: 4.6 10*3/uL (ref 1.7–7.7)
Neutrophils Relative %: 67 %
Platelets: 258 10*3/uL (ref 150–400)
RBC: 4.26 MIL/uL (ref 3.87–5.11)
RDW: 14.3 % (ref 11.5–15.5)
WBC: 6.7 10*3/uL (ref 4.0–10.5)
nRBC: 0 % (ref 0.0–0.2)

## 2020-06-12 LAB — BASIC METABOLIC PANEL
Anion gap: 9 (ref 5–15)
BUN: 23 mg/dL (ref 8–23)
CO2: 24 mmol/L (ref 22–32)
Calcium: 8.6 mg/dL — ABNORMAL LOW (ref 8.9–10.3)
Chloride: 103 mmol/L (ref 98–111)
Creatinine, Ser: 1.44 mg/dL — ABNORMAL HIGH (ref 0.44–1.00)
GFR, Estimated: 32 mL/min — ABNORMAL LOW (ref >60)
Glucose, Bld: 267 mg/dL — ABNORMAL HIGH (ref 70–99)
Potassium: 4.2 mmol/L (ref 3.5–5.1)
Sodium: 136 mmol/L (ref 135–145)

## 2020-06-12 LAB — I-STAT CHEM 8, ED
BUN: 27 mg/dL — ABNORMAL HIGH (ref 8–23)
Calcium, Ion: 1.09 mmol/L — ABNORMAL LOW (ref 1.15–1.40)
Chloride: 103 mmol/L (ref 98–111)
Creatinine, Ser: 1.4 mg/dL — ABNORMAL HIGH (ref 0.44–1.00)
Glucose, Bld: 255 mg/dL — ABNORMAL HIGH (ref 70–99)
HCT: 39 % (ref 36.0–46.0)
Hemoglobin: 13.3 g/dL (ref 12.0–15.0)
Potassium: 4.2 mmol/L (ref 3.5–5.1)
Sodium: 138 mmol/L (ref 135–145)
TCO2: 26 mmol/L (ref 22–32)

## 2020-06-12 MED ORDER — ACETAMINOPHEN 500 MG PO TABS: 1000.0000 mg | ORAL_TABLET | Freq: Once | ORAL | Status: DC

## 2020-06-12 MED ORDER — ACETAMINOPHEN 650 MG RE SUPP: 650.0000 mg | Freq: Four times a day (QID) | RECTAL | Status: DC | PRN

## 2020-06-12 MED ORDER — ACETAMINOPHEN 325 MG PO TABS: 650.0000 mg | ORAL_TABLET | Freq: Four times a day (QID) | ORAL | Status: DC | PRN

## 2020-06-12 MED ORDER — ONDANSETRON HCL 4 MG/2ML IJ SOLN
4.0000 mg | Freq: Once | INTRAMUSCULAR | Status: AC
  Administered 2020-06-12: 4 mg via INTRAVENOUS
  Filled 2020-06-12: qty 2

## 2020-06-12 MED ORDER — AMLODIPINE BESYLATE 5 MG PO TABS
5.0000 mg | ORAL_TABLET | Freq: Every day | ORAL | Status: DC
  Administered 2020-06-13 – 2020-06-16 (×4): 5 mg via ORAL
  Filled 2020-06-12 (×4): qty 1

## 2020-06-12 MED ORDER — FENTANYL CITRATE (PF) 100 MCG/2ML IJ SOLN: 50.0000 ug | Freq: Once | INTRAMUSCULAR | Status: DC

## 2020-06-12 MED ORDER — LACTATED RINGERS IV SOLN: INTRAVENOUS | Status: DC

## 2020-06-12 MED ORDER — FENTANYL CITRATE (PF) 100 MCG/2ML IJ SOLN
INTRAMUSCULAR | Status: AC
  Administered 2020-06-12: 25 ug via INTRAVENOUS
  Filled 2020-06-12: qty 2

## 2020-06-12 MED ORDER — FENTANYL CITRATE (PF) 100 MCG/2ML IJ SOLN
12.5000 ug | Freq: Once | INTRAMUSCULAR | Status: AC
  Administered 2020-06-12: 12.5 ug via INTRAVENOUS
  Filled 2020-06-12: qty 2

## 2020-06-12 MED ORDER — FENTANYL CITRATE (PF) 100 MCG/2ML IJ SOLN
12.5000 ug | Freq: Four times a day (QID) | INTRAMUSCULAR | Status: DC | PRN
  Administered 2020-06-13: 25 ug via INTRAVENOUS
  Filled 2020-06-12: qty 2

## 2020-06-12 MED ORDER — ACETAMINOPHEN 500 MG PO TABS
1000.0000 mg | ORAL_TABLET | Freq: Once | ORAL | Status: AC
  Administered 2020-06-13: 1000 mg via ORAL
  Filled 2020-06-12: qty 2

## 2020-06-12 MED ORDER — INSULIN ASPART 100 UNIT/ML ~~LOC~~ SOLN
0.0000 [IU] | SUBCUTANEOUS | Status: DC
  Administered 2020-06-13: 5 [IU] via SUBCUTANEOUS
  Administered 2020-06-13: 1 [IU] via SUBCUTANEOUS
  Administered 2020-06-13 (×2): 3 [IU] via SUBCUTANEOUS

## 2020-06-12 MED ORDER — LACTATED RINGERS IV BOLUS
500.0000 mL | Freq: Once | INTRAVENOUS | Status: AC
  Administered 2020-06-13: 500 mL via INTRAVENOUS

## 2020-06-12 MED ORDER — POLYETHYLENE GLYCOL 3350 17 G PO PACK: 17.0000 g | PACK | Freq: Every day | ORAL | Status: DC | PRN

## 2020-06-12 MED ORDER — FENTANYL CITRATE (PF) 100 MCG/2ML IJ SOLN: 12.5000 ug | Freq: Four times a day (QID) | INTRAMUSCULAR | Status: DC | PRN

## 2020-06-12 NOTE — Progress Notes (Signed)
   06/12/20 2117  Clinical Encounter Type  Visited With Patient  Visit Type Trauma  Referral From Nurse  Consult/Referral To Chaplain   Chaplain received Level 2 trauma alert while with actively dying Pt. Chaplain called the ED Bridge to let them know what the situation was and that if chaplain is needed, chaplain is available.  This note was prepared by Chaplain Resident, Dante Gang, MDiv. Chaplain remains available as needed through the on-call pager: [phone].

## 2020-06-12 NOTE — ED Provider Notes (Signed)
[facility] EMERGENCY DEPARTMENT Provider Note   CSN: [id number] Arrival date & time: 06/12/20  2100     History Chief Complaint  Patient presents with  . Fall    [name] is a 86 y.o. female.  86 yo F with a chief complaints of a fall.  Patient states that she was trying to get up from her bed to plug in her phone and she tripped over her bedding.  She struck the back of her head and landed on her low back.  Complaining of pain mostly to the left hip but also having some pain to the left medial low back and the back of her head.  She denies confusion denies vomiting.  The patient is on Xarelto for atrial fibrillation.  She was unable to get up from the floor due to the pain to the hip.  She denies chest pain or shortness of breath denies abdominal pain.  The history is provided by the patient.  Injury This is a new problem. The current episode started less than 1 hour ago. The problem occurs constantly. The problem has not changed since onset.Associated symptoms include headaches. Pertinent negatives include no chest pain, no abdominal pain and no shortness of breath. The symptoms are aggravated by bending and twisting. Nothing relieves the symptoms. She has tried nothing for the symptoms. The treatment provided no relief.       Past Medical History:  Diagnosis Date  . Acid reflux   . Anxiety   . CKD (chronic kidney disease)   . COPD (chronic obstructive pulmonary disease) ([place])   . Depressed   . Diabetes mellitus   . Glaucoma   . Hypercholesteremia   . Hypertension   . Legally blind in right eye, as defined in [place]   . Panic attacks   . Type 2 diabetes mellitus [facility])     Patient Active Problem List   Diagnosis Date Noted  . Closed comminuted intertrochanteric fracture of left femur ([place]) 06/12/2020  . Osteoarthritis of finger of left hand 11/06/2017  . Choledocholithiasis   . Common bile duct dilatation 07/14/2017  . Essential hypertension  07/14/2017  . Elevated LFTs 07/14/2017  . CKD (chronic kidney disease) stage 3, GFR 30-59 ml/min (HCC) 07/14/2017  . CAP (community acquired pneumonia) 06/30/2017  . Hypercholesteremia 06/30/2017  . Glaucoma 06/30/2017  . Anxiety 06/30/2017  . Acid reflux 06/30/2017  . Primary open angle glaucoma of both eyes, severe stage 06/03/2013  . Other and combined forms of senile cataract 09/10/2011  . Osteoarthritis 07/21/2011  . Altered mental status 07/21/2011  . COPD (chronic obstructive pulmonary disease) ([place]) 07/21/2011  . UTI (lower urinary tract infection) 07/21/2011  . Heart failure ([place]) 07/09/2011  . Hyperkalemia 07/09/2011  . Type 2 diabetes mellitus with hyperglycemia ([place]) 07/09/2011  . Cataract 06/04/2011  . TOTAL HIP FOLLOW-UP 04/14/2008  . HIP PAIN 03/17/2008  . SCIATICA 03/17/2008  . CUBITAL TUNNEL SYNDROME 02/10/2008  . KNEE, ARTHRITIS, DEGEN./OSTEO 02/10/2008  . KNEE PAIN 02/10/2008  . NECK PAIN 12/24/2007  . SHOULDER PAIN 11/17/2007  . RUPTURE ROTATOR CUFF 11/17/2007  . Type 2 diabetes mellitus ([place]) 05/13/2007    Past Surgical History:  Procedure Laterality Date  . APPENDECTOMY    . BALLOON DILATION  07/16/2017   Procedure: BALLOON DILATION;  Surgeon: Danie Binder, MD;  Location: [facility];  Service: Endoscopy;;  . cataracts    . CHOLECYSTECTOMY    . ERCP N/A 07/16/2017   biliary  papillary stenosis, filling defect c/w a stone and sludge, entire biliary tree dilated. completed biliary sphincterotomy and balloon extraction, stone removal  . ESOPHAGOGASTRODUODENOSCOPY  2006   Dr. Gala Romney: normal esophagus s/p empiric dilataton, small hiatal hernia  . HIP SURGERY     rod in leg as well  . MASS EXCISION Left 09/26/2017   Procedure: EXCISION MUCOID CYST WITH DISTAL INTERPHALANGEAL JOINT ARTHROTOMY OF LEFT MIDDLE FINGER;  Surgeon: Leanora Cover, MD;  Location: [facility];  Service: Orthopedics;  Laterality: Left;  . SPHINCTEROTOMY  07/16/2017    Procedure: SPHINCTEROTOMY;  Surgeon: Danie Binder, MD;  Location: [facility];  Service: Endoscopy;;  . TUBAL LIGATION       OB History   No obstetric history on file.     Family History  Problem Relation Age of Onset  . Blindness Father   . Bone cancer Father   . Brain cancer Sister   . Alzheimer's disease Brother   . Colon cancer Neg Hx     Social History   Tobacco Use  . Smoking status: Never Smoker  . Smokeless tobacco: Never Used  Vaping Use  . Vaping Use: Never used  Substance Use Topics  . Alcohol use: No  . Drug use: No    Home Medications Prior to Admission medications   Medication Sig Start Date End Date Taking? Authorizing Provider  gabapentin (NEURONTIN) 100 MG capsule TAKE 1 CAPSULE BY MOUTH THREE TIMES A DAY 03/31/20   Carole Civil, MD  amLODipine (NORVASC) 5 MG tablet Take 5 mg by mouth daily.      [provider]  busPIRone (BUSPAR) 10 MG tablet  09/01/18   [provider]  Difluprednate ([facility]) Apply to eye daily.     [provider]  furosemide (LASIX) 20 MG tablet Take 20 mg daily as needed by mouth for fluid.    [provider]  glipiZIDE (GLUCOTROL) 5 MG tablet Take 5 mg 2 (two) times daily by mouth.     [provider]  LOKELMA 10 g PACK packet Take 1 packet by mouth daily. 03/17/20   [provider]  omeprazole (PRILOSEC) 20 MG capsule Take 20 mg by mouth daily.      [provider]  pravastatin (PRAVACHOL) 40 MG tablet Take 40 mg by mouth daily.      [provider]  traMADol (ULTRAM) 50 MG tablet Take 1 tablet (50 mg total) by mouth every 12 (twelve) hours as needed for severe pain. 10/13/19   Wurst, [place], PA-C  TRESIBA FLEXTOUCH 100 UNIT/ML FlexTouch Pen  12/08/19   [provider]  XARELTO 15 MG TABS tablet Take 15 mg by mouth daily. 12/01/19   [provider]    Allergies    Hydromorphone hcl  Review of Systems   Review of Systems    Constitutional: Negative for chills and fever.  HENT: Negative for congestion and rhinorrhea.   Eyes: Negative for redness and visual disturbance.  Respiratory: Negative for shortness of breath and wheezing.   Cardiovascular: Negative for chest pain and palpitations.  Gastrointestinal: Negative for abdominal pain, nausea and vomiting.  Genitourinary: Negative for dysuria and urgency.  Musculoskeletal: Positive for arthralgias and myalgias.  Skin: Negative for pallor and wound.  Neurological: Positive for headaches. Negative for dizziness.    Physical Exam Updated Vital Signs BP (!) 146/84   Pulse 82   Temp 97.9 F (36.6 C) (Oral)   Ht 5\' 2"  (1.575 m)  Wt 85.7 kg   SpO2 97%   BMI 34.57 kg/m   Physical Exam Vitals and nursing note reviewed.  Constitutional:      General: She is not in acute distress.    Appearance: She is well-developed. She is not diaphoretic.  HENT:     Head: Normocephalic and atraumatic.  Eyes:     Pupils: Pupils are equal, round, and reactive to light.  Cardiovascular:     Rate and Rhythm: Normal rate and regular rhythm.     Heart sounds: No murmur heard.  No friction rub. No gallop.   Pulmonary:     Effort: Pulmonary effort is normal.     Breath sounds: No wheezing or rales.  Abdominal:     General: There is no distension.     Palpations: Abdomen is soft.     Tenderness: There is no abdominal tenderness.  Musculoskeletal:        General: Tenderness present.     Cervical back: Normal range of motion and neck supple.     Comments: Left lower extremity has shortening compared to the right.  Pulse motor and sensation are intact distally.  Pain with internal and external rotation of the hip as well as compression of the pelvis.  Mild pain about the mid L-spine without obvious step-off or deformity.  Skin:    General: Skin is warm and dry.  Neurological:     Mental Status: She is alert and oriented to person, place, and time.  Psychiatric:         Behavior: Behavior normal.     ED Results / Procedures / Treatments   Labs (all labs ordered are listed, but only abnormal results are displayed) Labs Reviewed  BASIC METABOLIC PANEL - Abnormal; Notable for the following components:      Result Value   Glucose, Bld 267 (*)    Creatinine, Ser 1.44 (*)    Calcium 8.6 (*)    GFR, Estimated 32 (*)    All other components within normal limits  I-STAT CHEM 8, ED - Abnormal; Notable for the following components:   BUN 27 (*)    Creatinine, Ser 1.40 (*)    Glucose, Bld 255 (*)    Calcium, Ion 1.09 (*)    All other components within normal limits  RESPIRATORY PANEL BY RT PCR (FLU A&B, COVID)  CBC WITH DIFFERENTIAL/PLATELET  HEMOGLOBIN A1C  CBC  BASIC METABOLIC PANEL    EKG EKG Interpretation  Date/Time:  Sunday June 12 2020 21:05:45 EDT Ventricular Rate:  84 PR Interval:    QRS Duration: 108 QT Interval:  389 QTC Calculation: 460 R Axis:   -8 Text Interpretation: Atrial fibrillation Low voltage, extremity and precordial leads No significant change since last tracing Confirmed by Deno Etienne [phone]) on 06/12/2020 9:09:04 PM   Radiology DG Chest 1 View  Result Date: 06/12/2020 CLINICAL DATA:  Low back pain recent fall with chest pain, initial encounter EXAM: CHEST  1 VIEW COMPARISON:  05/12/2020 FINDINGS: Cardiac shadow is stable. Aortic calcifications and tortuosity are seen. The lungs are clear bilaterally. No acute bony abnormality is seen. IMPRESSION: No acute abnormality noted. Electronically Signed   By: Inez Catalina M.D.   On: 06/12/2020 22:20   DG Lumbar Spine 2-3 Views  Result Date: 06/12/2020 CLINICAL DATA:  Low back pain following fall, initial encounter EXAM: LUMBAR SPINE - 3 VIEW COMPARISON:  09/17/2018 FINDINGS: Five lumbar type vertebral bodies are well visualized. Vertebral body height is well maintained. Mild  degenerative anterolisthesis of L4 on L5 is noted. No other focal abnormality is seen. Right hip  replacement is noted. IMPRESSION: Degenerative anterolisthesis of L4 on L5. This is stable from the previous exam. Electronically Signed   By: Inez Catalina M.D.   On: 06/12/2020 22:17   DG Knee 1-2 Views Left  Result Date: 06/12/2020 CLINICAL DATA:  Recent fall with left knee pain, initial encounter EXAM: LEFT KNEE - 2 VIEW COMPARISON:  None. FINDINGS: Degenerative changes of the knee joint are seen. No acute fracture or dislocation is noted. IMPRESSION: Degenerative change without acute abnormality. Electronically Signed   By: Inez Catalina M.D.   On: 06/12/2020 22:22   CT Head Wo Contrast  Result Date: 06/12/2020 CLINICAL DATA:  86 year old female with head trauma. EXAM: CT HEAD WITHOUT CONTRAST CT CERVICAL SPINE WITHOUT CONTRAST TECHNIQUE: Multidetector CT imaging of the head and cervical spine was performed following the standard protocol without intravenous contrast. Multiplanar CT image reconstructions of the cervical spine were also generated. COMPARISON:  None. FINDINGS: CT HEAD FINDINGS Brain: There is moderate age-related atrophy and chronic microvascular ischemic changes. There is no acute intracranial hemorrhage. No mass effect or midline shift. No extra-axial fluid collection. Vascular: No hyperdense vessel or unexpected calcification. Skull: Normal. Negative for fracture or focal lesion. Sinuses/Orbits: Mild mucoperiosteal thickening of paranasal sinuses with partial opacification of the right sphenoid sinus. No air-fluid level. The mastoid air cells are clear. Other: None CT CERVICAL SPINE FINDINGS Alignment: No acute subluxation. Skull base and vertebrae: No acute fracture.  Osteopenia. Soft tissues and spinal canal: No prevertebral fluid or swelling. No visible canal hematoma. Disc levels:  Multilevel degenerative changes. Upper chest: Mild emphysema. Moderate atherosclerotic calcification of the aortic arch. The aortic arch is mildly dilated measuring up to 3.8 cm in diameter. Other:  Bilateral carotid bulb calcified plaques. IMPRESSION: 1. No acute intracranial pathology. Moderate age-related atrophy and chronic microvascular ischemic changes. 2. No acute/traumatic cervical spine pathology. 3. Aortic Atherosclerosis (ICD10-I70.0). Electronically Signed   By: Anner Crete M.D.   On: 06/12/2020 21:37   CT Cervical Spine Wo Contrast  Result Date: 06/12/2020 CLINICAL DATA:  86 year old female with head trauma. EXAM: CT HEAD WITHOUT CONTRAST CT CERVICAL SPINE WITHOUT CONTRAST TECHNIQUE: Multidetector CT imaging of the head and cervical spine was performed following the standard protocol without intravenous contrast. Multiplanar CT image reconstructions of the cervical spine were also generated. COMPARISON:  None. FINDINGS: CT HEAD FINDINGS Brain: There is moderate age-related atrophy and chronic microvascular ischemic changes. There is no acute intracranial hemorrhage. No mass effect or midline shift. No extra-axial fluid collection. Vascular: No hyperdense vessel or unexpected calcification. Skull: Normal. Negative for fracture or focal lesion. Sinuses/Orbits: Mild mucoperiosteal thickening of paranasal sinuses with partial opacification of the right sphenoid sinus. No air-fluid level. The mastoid air cells are clear. Other: None CT CERVICAL SPINE FINDINGS Alignment: No acute subluxation. Skull base and vertebrae: No acute fracture.  Osteopenia. Soft tissues and spinal canal: No prevertebral fluid or swelling. No visible canal hematoma. Disc levels:  Multilevel degenerative changes. Upper chest: Mild emphysema. Moderate atherosclerotic calcification of the aortic arch. The aortic arch is mildly dilated measuring up to 3.8 cm in diameter. Other: Bilateral carotid bulb calcified plaques. IMPRESSION: 1. No acute intracranial pathology. Moderate age-related atrophy and chronic microvascular ischemic changes. 2. No acute/traumatic cervical spine pathology. 3. Aortic Atherosclerosis  (ICD10-I70.0). Electronically Signed   By: Anner Crete M.D.   On: 06/12/2020 21:37   DG Hip Unilat W  or Wo Pelvis 2-3 Views Left  Result Date: 06/12/2020 CLINICAL DATA:  Recent fall with left hip pain, initial encounter EXAM: DG HIP (WITH OR WITHOUT PELVIS) 3V LEFT COMPARISON:  None. FINDINGS: Pelvic ring is intact. There is a comminuted intratrochanteric fracture of the proximal left femur with impaction and angulation at the fracture site. Prior right hip replacement is seen. IMPRESSION: Comminuted intratrochanteric left femoral fracture. Electronically Signed   By: Inez Catalina M.D.   On: 06/12/2020 22:21    Procedures Procedures (including critical care time)  Medications Ordered in ED Medications  insulin aspart (novoLOG) injection 0-9 Units (has no administration in time range)  amLODipine (NORVASC) tablet 5 mg (has no administration in time range)  polyethylene glycol (MIRALAX / GLYCOLAX) packet 17 g (has no administration in time range)  lactated ringers bolus 500 mL (has no administration in time range)  acetaminophen (TYLENOL) tablet 1,000 mg (has no administration in time range)  fentaNYL (SUBLIMAZE) injection 12.5-25 mcg (25 mcg Intravenous Given 06/12/20 2319)  acetaminophen (TYLENOL) tablet 650 mg (has no administration in time range)    Or  acetaminophen (TYLENOL) suppository 650 mg (has no administration in time range)  ondansetron (ZOFRAN) injection 4 mg (4 mg Intravenous Given 06/12/20 2110)  fentaNYL (SUBLIMAZE) injection 12.5 mcg (12.5 mcg Intravenous Given 06/12/20 2110)    ED Course  I have reviewed the triage vital signs and the nursing notes.  Pertinent labs & imaging results that were available during my care of the patient were reviewed by me and considered in my medical decision making (see chart for details).    MDM Rules/Calculators/A&P                          86 yo F with a chief complaints of a fall.  Nonsyncopal by history.  Patient complaining  of striking her head on the ground as well as pain to the left hip.  Clinically the patient likely has a hip fracture.  Will obtain a evaluation here.  Plain film viewed by me of the left hip with an anterior trochanteric fracture.  Discussed with medicine for admission.  The patients results and plan were reviewed and discussed.   Any x-rays performed were independently reviewed by myself.   Differential diagnosis were considered with the presenting HPI.  Medications  insulin aspart (novoLOG) injection 0-9 Units (has no administration in time range)  amLODipine (NORVASC) tablet 5 mg (has no administration in time range)  polyethylene glycol (MIRALAX / GLYCOLAX) packet 17 g (has no administration in time range)  lactated ringers bolus 500 mL (has no administration in time range)  acetaminophen (TYLENOL) tablet 1,000 mg (has no administration in time range)  fentaNYL (SUBLIMAZE) injection 12.5-25 mcg (25 mcg Intravenous Given 06/12/20 2319)  acetaminophen (TYLENOL) tablet 650 mg (has no administration in time range)    Or  acetaminophen (TYLENOL) suppository 650 mg (has no administration in time range)  ondansetron (ZOFRAN) injection 4 mg (4 mg Intravenous Given 06/12/20 2110)  fentaNYL (SUBLIMAZE) injection 12.5 mcg (12.5 mcg Intravenous Given 06/12/20 2110)    Vitals:   06/12/20 2107 06/12/20 2120 06/12/20 2121 06/12/20 2322  BP: (!) 146/82  (!) 138/91 (!) 146/84  Pulse:   95 82  Temp:   97.9 F (36.6 C)   TempSrc:   Oral   SpO2:    97%  Weight:  85.7 kg    Height:  5\' 2"  (1.575 m)  Final diagnoses:  Closed intertrochanteric fracture of hip, left, initial encounter [facility])    Admission/ observation were discussed with the admitting physician, patient and/or family and they are comfortable with the plan.     Final Clinical Impression(s) / ED Diagnoses Final diagnoses:  Closed intertrochanteric fracture of hip, left, initial encounter [facility])    Rx / [place] Orders ED  Discharge Orders    None       Deno Etienne, DO 06/12/20 2335

## 2020-06-12 NOTE — ED Triage Notes (Signed)
Pt presents via EMS ([place]) for fall from standing, mechanical. L leg shortening, no rotation, states hit head on dresser, on eliquis, also reports lumbar pain. EDP contacted to upgrade to level 2. Manual 146/82. Received 145mcg fentanyl pta.Covid 4wks ago.

## 2020-06-12 NOTE — H&P (Addendum)
Date: 06/12/2020               Patient Name:  [name] MRN: [id number]  DOB: 02/11/1929 Age / Sex: 86 y.o., female   PCP: Celene Squibb, MD         Medical Service: Internal Medicine Teaching Service         Attending Physician: Dr. Joni Reining    First Contact: Dr. Johnney Ou Pager: [phone]  Second Contact: Dr. Gilford Rile Pager: [phone]       After Hours (After 5p/  First Contact Pager: [phone]  weekends / holidays): Second Contact Pager: [phone]   Chief Complaint: fall with hip pain  History of Present Illness:   [name] is a 86 y.o. year old female with hx of A fib on xarelto, HFpEF, DM, HTN, HLD, COPD, CKD 3, GERD, depression, anxiety, legally blind in R eye presenting for fall with L hip pain. She states that around 8pm this evening, she was plugging her phone in when she tripped over her bedsheets and fell, striking her head on furniture and landed on her L side. Complaining of pain mostly to the L hip, but also in the L lower back. Takes Xarelto every day for A fib. Believes this was a mechanical fall, denies CP, palpitations, SOB, dizziness, loss of consciousness, or headache. No other recent falls.  Daughter is in the room and assisted with history.  Meds:  Current Outpatient Medications  Medication Instructions  . amLODipine (NORVASC) 5 mg, Oral, Daily  . busPIRone (BUSPAR) 10 MG tablet No dose, route, or frequency recorded.  . Difluprednate (DUREZOL OP) Ophthalmic, Daily  . furosemide (LASIX) 20 mg, Oral, Daily PRN  . gabapentin (NEURONTIN) 100 MG capsule TAKE 1 CAPSULE BY MOUTH THREE TIMES A DAY  . glipiZIDE (GLUCOTROL) 5 mg, Oral, 2 times daily  . LOKELMA 10 g PACK packet 1 packet, Oral, Daily  . omeprazole (PRILOSEC) 20 mg, Oral, Daily  . pravastatin (PRAVACHOL) 40 mg, Oral, Daily  . traMADol (ULTRAM) 50 mg, Oral, Every 12 hours PRN  . TRESIBA FLEXTOUCH 100 UNIT/ML FlexTouch Pen No dose, route, or frequency recorded.  . Xarelto 15 mg, Oral,  Daily    Allergies: Allergies as of 06/12/2020 - Review Complete 05/12/2020  Allergen Reaction Noted  . Hydromorphone hcl Other (See Comments)    Past Medical History:  Diagnosis Date  . Acid reflux   . Anxiety   . CKD (chronic kidney disease)   . COPD (chronic obstructive pulmonary disease) ([place])   . Depressed   . Diabetes mellitus   . Glaucoma   . Hypercholesteremia   . Hypertension   . Legally blind in right eye, as defined in [place]   . Panic attacks   . Type 2 diabetes mellitus (HCC)     Family History:  Family History  Problem Relation Age of Onset  . Blindness Father   . Bone cancer Father   . Brain cancer Sister   . Alzheimer's disease Brother   . Colon cancer Neg Hx     Social History:  Social History   Tobacco Use  . Smoking status: Never Smoker  . Smokeless tobacco: Never Used  Vaping Use  . Vaping Use: Never used  Substance Use Topics  . Alcohol use: No  . 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 Lives with her son. Needs help with groceries and cooking due to decreased vision in  L eye, but able to dress and bathe independently. Gets around with a walker without any issues.  Review of Systems: Review of Systems  Constitutional: Negative for chills and fever.  HENT: Negative for congestion and sore throat.   Eyes:       Legally blind in R eye chronically  Respiratory: Negative for cough and shortness of breath.   Cardiovascular: Negative for chest pain and palpitations.  Gastrointestinal: Negative for nausea and vomiting.  Genitourinary: Negative for dysuria and frequency.  Musculoskeletal: Positive for back pain, falls and myalgias. Negative for neck pain.  Skin: Negative for itching and rash.  Neurological: Negative for dizziness, loss of consciousness and headaches.     Physical Exam: Physical Exam Vitals and nursing note reviewed.  Constitutional:      General: She is in acute distress.     Appearance: Normal  appearance.  HENT:     Head: Normocephalic and atraumatic.     Right Ear: External ear normal.     Left Ear: External ear normal.     Nose: Nose normal.     Mouth/Throat:     Mouth: Mucous membranes are moist.  Eyes:     General:        Right eye: No discharge.        Left eye: No discharge.     Extraocular Movements: Extraocular movements intact.  Cardiovascular:     Rate and Rhythm: Normal rate. Rhythm irregular.     Heart sounds: No murmur heard.  No friction rub. No gallop.   Pulmonary:     Effort: Pulmonary effort is normal. No respiratory distress.     Breath sounds: Normal breath sounds. No stridor. No wheezing, rhonchi or rales.  Abdominal:     General: Abdomen is flat. Bowel sounds are normal.  Musculoskeletal:     Cervical back: Normal range of motion.     Comments: L leg shortened and internally rotated Pain in L hip with position changes Distal foot with intact sensation, motor ability, and pulses  Skin:    General: Skin is warm and dry.  Neurological:     General: No focal deficit present.     Mental Status: She is alert and oriented to person, place, and time.  Psychiatric:        Mood and Affect: Mood normal.        Behavior: Behavior normal.      EKG: personally reviewed my interpretation is A fib without RVR  CXR: personally reviewed my interpretation is multiple opacities reflecting bra strap clips, no other abnormalities noted  Xray hip: Comminuted intratrochanteric left femoral fracture. Xray knee: Degenerative change without acute abnormality. Xray L spine: Degenerative anterolisthesis of L4 on L5. This is stable from the previous exam. CT C spine: no acute C spine pathology CT head: No acute intracranial pathology, chronic age-related and microvascular changes  Assessment & Plan by Problem: Principal Problem:   Closed comminuted intertrochanteric fracture of left femur (HCC) Active Problems:   Essential hypertension   CKD (chronic kidney  disease) stage 3, GFR 30-59 ml/min (HCC)   Type 2 diabetes mellitus with hyperglycemia (HCC)   [name] is a 86 y.o. year old female with hx of A fib on xarelto, HFpEF, DM, HTN, HLD, COPD, CKD 3, GERD, depression, anxiety, legally blind in R eye presenting for fall with L hip pain. Xray with comminuted intratrochanteric left femoral fracture, ortho to see patient.  L hip fracture Mechanical fall Tripped over bed  sheets, struck head on furniture and landed on L side. Xray with comminuted intratrochanteric left femoral fracture. Imaging of L knee, spine, and head are benign. On exam, LLE is shortened compared to the R and internally rotated. Neurovascularly intact distally. No known history of osteoporosis, no known DEXA scan.  -orthopedic surgery consulted, f/u recommendations -pain control with fentanyl q6hr prn and tylenol     -start miralax -NPO for likely surgery -consider bone density scan, in the setting of advanced age and DNR  Chronic A fib without RVR Reports compliance wit Xarelto. Rate is currently controlled, but known rate agent at home. -hold Xarelto pending ortho recs  Chronic HFpEF Most recent echo from 07/16/2017 with EF 65-70%, unable to assess diastolic function due to A fib. On Lasix 20mg  daily prn at home. No signs of volume overload on exam -holding home prn Lasix for now given euvolemic status  DM with peripheral neuropathy Last A1c in 2018 in 8.0. -f/u A1c -start SSI -continue home gabapentin after surgery  HTN On amlodipine 5mg  daily at home. BP controlled on admission. -continue home meds  CKD 3 Creatinine 1.40 on admission which is around her baseline. -daily BMP -avoid nephrotoxins  HLD On pravastatin 40mg  daily at home. -resume by time of discharge  Dispo: Admit patient to Inpatient with expected length of stay greater than 2 midnights.  Signed: Andrew Au, MD 06/12/2020, 10:40 PM  Pager: [phone]  After 5pm on weekdays and  1pm on weekends: On Call pager: [phone]

## 2020-06-12 NOTE — ED Notes (Signed)
Called [facility] to Dr Claudie Fisherman

## 2020-06-13 ENCOUNTER — Encounter (HOSPITAL_COMMUNITY)
Admission: EM | Disposition: A | Discharge status: Home Health | Payer: Self-pay | Source: Home / Self Care | Attending: Internal Medicine

## 2020-06-13 ENCOUNTER — Inpatient Hospital Stay (HOSPITAL_COMMUNITY): Payer: Medicare Other

## 2020-06-13 ENCOUNTER — Inpatient Hospital Stay (HOSPITAL_COMMUNITY): Payer: Medicare Other | Admitting: Anesthesiology

## 2020-06-13 ENCOUNTER — Other Ambulatory Visit: Payer: Self-pay

## 2020-06-13 DIAGNOSIS — I482 Chronic atrial fibrillation, unspecified: Secondary | ICD-10-CM

## 2020-06-13 DIAGNOSIS — I130 Hypertensive heart and chronic kidney disease with heart failure and stage 1 through stage 4 chronic kidney disease, or unspecified chronic kidney disease: Secondary | ICD-10-CM

## 2020-06-13 DIAGNOSIS — J449 Chronic obstructive pulmonary disease, unspecified: Secondary | ICD-10-CM

## 2020-06-13 DIAGNOSIS — K219 Gastro-esophageal reflux disease without esophagitis: Secondary | ICD-10-CM

## 2020-06-13 DIAGNOSIS — E1142 Type 2 diabetes mellitus with diabetic polyneuropathy: Secondary | ICD-10-CM

## 2020-06-13 DIAGNOSIS — E1122 Type 2 diabetes mellitus with diabetic chronic kidney disease: Secondary | ICD-10-CM

## 2020-06-13 DIAGNOSIS — E785 Hyperlipidemia, unspecified: Secondary | ICD-10-CM

## 2020-06-13 DIAGNOSIS — M545 Low back pain, unspecified: Secondary | ICD-10-CM

## 2020-06-13 DIAGNOSIS — I5032 Chronic diastolic (congestive) heart failure: Secondary | ICD-10-CM

## 2020-06-13 DIAGNOSIS — S72142A Displaced intertrochanteric fracture of left femur, initial encounter for closed fracture: Principal | ICD-10-CM

## 2020-06-13 DIAGNOSIS — W010XXA Fall on same level from slipping, tripping and stumbling without subsequent striking against object, initial encounter: Secondary | ICD-10-CM

## 2020-06-13 DIAGNOSIS — Z7984 Long term (current) use of oral hypoglycemic drugs: Secondary | ICD-10-CM

## 2020-06-13 DIAGNOSIS — N1832 Chronic kidney disease, stage 3b: Secondary | ICD-10-CM

## 2020-06-13 DIAGNOSIS — Z7901 Long term (current) use of anticoagulants: Secondary | ICD-10-CM

## 2020-06-13 HISTORY — PX: INTRAMEDULLARY (IM) NAIL INTERTROCHANTERIC: SHX5875

## 2020-06-13 LAB — BASIC METABOLIC PANEL
Anion gap: 9 (ref 5–15)
BUN: 23 mg/dL (ref 8–23)
CO2: 24 mmol/L (ref 22–32)
Calcium: 8.8 mg/dL — ABNORMAL LOW (ref 8.9–10.3)
Chloride: 104 mmol/L (ref 98–111)
Creatinine, Ser: 1.33 mg/dL — ABNORMAL HIGH (ref 0.44–1.00)
GFR, Estimated: 35 mL/min — ABNORMAL LOW (ref >60)
Glucose, Bld: 256 mg/dL — ABNORMAL HIGH (ref 70–99)
Potassium: 4.4 mmol/L (ref 3.5–5.1)
Sodium: 137 mmol/L (ref 135–145)

## 2020-06-13 LAB — CBC
HCT: 38.2 % (ref 36.0–46.0)
Hemoglobin: 12.2 g/dL (ref 12.0–15.0)
MCH: 29.6 pg (ref 26.0–34.0)
MCHC: 31.9 g/dL (ref 30.0–36.0)
MCV: 92.7 fL (ref 80.0–100.0)
Platelets: 239 10*3/uL (ref 150–400)
RBC: 4.12 MIL/uL (ref 3.87–5.11)
RDW: 14.2 % (ref 11.5–15.5)
WBC: 12.5 10*3/uL — ABNORMAL HIGH (ref 4.0–10.5)
nRBC: 0 % (ref 0.0–0.2)

## 2020-06-13 LAB — GLUCOSE, CAPILLARY
Glucose-Capillary: 110 mg/dL — ABNORMAL HIGH (ref 70–99)
Glucose-Capillary: 273 mg/dL — ABNORMAL HIGH (ref 70–99)

## 2020-06-13 LAB — CBG MONITORING, ED
Glucose-Capillary: 121 mg/dL — ABNORMAL HIGH (ref 70–99)
Glucose-Capillary: 129 mg/dL — ABNORMAL HIGH (ref 70–99)
Glucose-Capillary: 207 mg/dL — ABNORMAL HIGH (ref 70–99)
Glucose-Capillary: 242 mg/dL — ABNORMAL HIGH (ref 70–99)
Glucose-Capillary: 258 mg/dL — ABNORMAL HIGH (ref 70–99)

## 2020-06-13 LAB — RESPIRATORY PANEL BY RT PCR (FLU A&B, COVID)
Influenza A by PCR: NEGATIVE
Influenza B by PCR: NEGATIVE
SARS Coronavirus 2 by RT PCR: NEGATIVE

## 2020-06-13 SURGERY — FIXATION, FRACTURE, INTERTROCHANTERIC, WITH INTRAMEDULLARY ROD
Anesthesia: General | Laterality: Left

## 2020-06-13 MED ORDER — CHLORHEXIDINE GLUCONATE 4 % EX LIQD
60.0000 mL | Freq: Once | CUTANEOUS | Status: DC
  Filled 2020-06-13: qty 60

## 2020-06-13 MED ORDER — ONDANSETRON HCL 4 MG/2ML IJ SOLN
4.0000 mg | Freq: Once | INTRAMUSCULAR | Status: AC | PRN
  Administered 2020-06-14: 4 mg via INTRAVENOUS

## 2020-06-13 MED ORDER — CEFAZOLIN SODIUM-DEXTROSE 2-4 GM/100ML-% IV SOLN
2.0000 g | INTRAVENOUS | Status: AC
  Administered 2020-06-13: 2 g via INTRAVENOUS
  Filled 2020-06-13: qty 100

## 2020-06-13 MED ORDER — PRAVASTATIN SODIUM 40 MG PO TABS
40.0000 mg | ORAL_TABLET | Freq: Every day | ORAL | Status: DC
  Administered 2020-06-13 – 2020-06-16 (×4): 40 mg via ORAL
  Filled 2020-06-13 (×4): qty 1

## 2020-06-13 MED ORDER — INSULIN ASPART 100 UNIT/ML ~~LOC~~ SOLN
0.0000 [IU] | Freq: Every day | SUBCUTANEOUS | Status: DC
  Administered 2020-06-13: 3 [IU] via SUBCUTANEOUS

## 2020-06-13 MED ORDER — SUGAMMADEX SODIUM 200 MG/2ML IV SOLN
INTRAVENOUS | Status: DC | PRN
  Administered 2020-06-13: 170 mg via INTRAVENOUS

## 2020-06-13 MED ORDER — HEPARIN (PORCINE) 25000 UT/250ML-% IV SOLN: 950.0000 [IU]/h | INTRAVENOUS | Status: DC

## 2020-06-13 MED ORDER — EPHEDRINE 5 MG/ML INJ
INTRAVENOUS | Status: AC
  Filled 2020-06-13: qty 10

## 2020-06-13 MED ORDER — INSULIN ASPART 100 UNIT/ML ~~LOC~~ SOLN
SUBCUTANEOUS | Status: AC
  Filled 2020-06-13: qty 1

## 2020-06-13 MED ORDER — ONDANSETRON HCL 4 MG/2ML IJ SOLN
INTRAMUSCULAR | Status: AC
  Filled 2020-06-13: qty 2

## 2020-06-13 MED ORDER — FENTANYL CITRATE (PF) 100 MCG/2ML IJ SOLN
INTRAMUSCULAR | Status: AC
  Filled 2020-06-13: qty 2

## 2020-06-13 MED ORDER — CEFAZOLIN SODIUM-DEXTROSE 2-4 GM/100ML-% IV SOLN
2.0000 g | Freq: Four times a day (QID) | INTRAVENOUS | Status: AC
  Administered 2020-06-13 – 2020-06-14 (×2): 2 g via INTRAVENOUS
  Filled 2020-06-13 (×2): qty 100

## 2020-06-13 MED ORDER — ACETAMINOPHEN 325 MG PO TABS: 325.0000 mg | ORAL_TABLET | Freq: Once | ORAL | Status: DC

## 2020-06-13 MED ORDER — 0.9 % SODIUM CHLORIDE (POUR BTL) OPTIME
TOPICAL | Status: DC | PRN
  Administered 2020-06-13: 1000 mL

## 2020-06-13 MED ORDER — SUCCINYLCHOLINE CHLORIDE 200 MG/10ML IV SOSY
PREFILLED_SYRINGE | INTRAVENOUS | Status: AC
  Filled 2020-06-13: qty 10

## 2020-06-13 MED ORDER — ROCURONIUM BROMIDE 10 MG/ML (PF) SYRINGE
PREFILLED_SYRINGE | INTRAVENOUS | Status: DC | PRN
  Administered 2020-06-13: 40 mg via INTRAVENOUS

## 2020-06-13 MED ORDER — HYDROMORPHONE HCL 1 MG/ML IJ SOLN
0.5000 mg | INTRAMUSCULAR | Status: DC | PRN
  Administered 2020-06-13: 1 mg via INTRAVENOUS
  Filled 2020-06-13: qty 1

## 2020-06-13 MED ORDER — PREDNISOLONE ACETATE 1 % OP SUSP
1.0000 [drp] | Freq: Every day | OPHTHALMIC | Status: DC
  Administered 2020-06-15: 1 [drp] via OPHTHALMIC
  Filled 2020-06-13 (×4): qty 5

## 2020-06-13 MED ORDER — GABAPENTIN 100 MG PO CAPS
100.0000 mg | ORAL_CAPSULE | Freq: Three times a day (TID) | ORAL | Status: DC
  Administered 2020-06-13 – 2020-06-16 (×10): 100 mg via ORAL
  Filled 2020-06-13 (×10): qty 1

## 2020-06-13 MED ORDER — PROPOFOL 10 MG/ML IV BOLUS
INTRAVENOUS | Status: AC
  Filled 2020-06-13: qty 20

## 2020-06-13 MED ORDER — METHOCARBAMOL 1000 MG/10ML IJ SOLN
500.0000 mg | Freq: Four times a day (QID) | INTRAVENOUS | Status: DC
  Administered 2020-06-13 (×2): 500 mg via INTRAVENOUS
  Filled 2020-06-13 (×6): qty 5

## 2020-06-13 MED ORDER — LACTATED RINGERS IV BOLUS
1000.0000 mL | Freq: Once | INTRAVENOUS | Status: AC
  Administered 2020-06-13: 150 mL via INTRAVENOUS

## 2020-06-13 MED ORDER — FENTANYL CITRATE (PF) 250 MCG/5ML IJ SOLN
INTRAMUSCULAR | Status: DC | PRN
  Administered 2020-06-13 (×2): 50 ug via INTRAVENOUS

## 2020-06-13 MED ORDER — INSULIN ASPART 100 UNIT/ML ~~LOC~~ SOLN
0.0000 [IU] | Freq: Three times a day (TID) | SUBCUTANEOUS | Status: DC
  Administered 2020-06-14: 5 [IU] via SUBCUTANEOUS
  Administered 2020-06-14: 3 [IU] via SUBCUTANEOUS
  Administered 2020-06-14: 5 [IU] via SUBCUTANEOUS
  Administered 2020-06-15 – 2020-06-16 (×3): 3 [IU] via SUBCUTANEOUS

## 2020-06-13 MED ORDER — CHLORHEXIDINE GLUCONATE 0.12 % MT SOLN
15.0000 mL | Freq: Once | OROMUCOSAL | Status: AC
  Administered 2020-06-13: 15 mL via OROMUCOSAL
  Filled 2020-06-13: qty 15

## 2020-06-13 MED ORDER — ACETAMINOPHEN 10 MG/ML IV SOLN
INTRAVENOUS | Status: AC
  Filled 2020-06-13: qty 100

## 2020-06-13 MED ORDER — SODIUM ZIRCONIUM CYCLOSILICATE 10 G PO PACK
10.0000 g | PACK | Freq: Every day | ORAL | Status: DC
  Administered 2020-06-14 – 2020-06-16 (×3): 10 g via ORAL
  Filled 2020-06-13 (×4): qty 1

## 2020-06-13 MED ORDER — PANTOPRAZOLE SODIUM 40 MG PO TBEC
40.0000 mg | DELAYED_RELEASE_TABLET | Freq: Every day | ORAL | Status: DC
  Administered 2020-06-13 – 2020-06-16 (×4): 40 mg via ORAL
  Filled 2020-06-13 (×4): qty 1

## 2020-06-13 MED ORDER — METHOCARBAMOL 500 MG PO TABS
ORAL_TABLET | ORAL | Status: AC
  Filled 2020-06-13: qty 1

## 2020-06-13 MED ORDER — METHOCARBAMOL 500 MG PO TABS
500.0000 mg | ORAL_TABLET | Freq: Four times a day (QID) | ORAL | Status: DC | PRN
  Administered 2020-06-14 – 2020-06-16 (×2): 500 mg via ORAL
  Filled 2020-06-13 (×2): qty 1

## 2020-06-13 MED ORDER — HEPARIN SODIUM (PORCINE) 5000 UNIT/ML IJ SOLN
5000.0000 [IU] | Freq: Three times a day (TID) | INTRAMUSCULAR | Status: DC
  Administered 2020-06-13: 5000 [IU] via SUBCUTANEOUS
  Filled 2020-06-13: qty 1

## 2020-06-13 MED ORDER — METHOCARBAMOL 1000 MG/10ML IJ SOLN: 500.0000 mg | Freq: Four times a day (QID) | INTRAMUSCULAR | Status: DC

## 2020-06-13 MED ORDER — METHOCARBAMOL 1000 MG/10ML IJ SOLN: 500.0000 mg | Freq: Four times a day (QID) | INTRAVENOUS | Status: DC | PRN

## 2020-06-13 MED ORDER — DEXAMETHASONE SODIUM PHOSPHATE 10 MG/ML IJ SOLN
INTRAMUSCULAR | Status: DC | PRN
  Administered 2020-06-13: 4 mg via INTRAVENOUS

## 2020-06-13 MED ORDER — FENTANYL CITRATE (PF) 100 MCG/2ML IJ SOLN
25.0000 ug | INTRAMUSCULAR | Status: DC | PRN
  Administered 2020-06-13 (×4): 25 ug via INTRAVENOUS

## 2020-06-13 MED ORDER — ONDANSETRON HCL 4 MG/2ML IJ SOLN
INTRAMUSCULAR | Status: DC | PRN
  Administered 2020-06-13: 4 mg via INTRAVENOUS

## 2020-06-13 MED ORDER — PHENYLEPHRINE HCL-NACL 10-0.9 MG/250ML-% IV SOLN
INTRAVENOUS | Status: DC | PRN
  Administered 2020-06-13: 50 ug/min via INTRAVENOUS

## 2020-06-13 MED ORDER — LIDOCAINE 2% (20 MG/ML) 5 ML SYRINGE
INTRAMUSCULAR | Status: AC
  Filled 2020-06-13: qty 5

## 2020-06-13 MED ORDER — LACTATED RINGERS IV SOLN: INTRAVENOUS | Status: DC

## 2020-06-13 MED ORDER — TRANEXAMIC ACID-NACL 1000-0.7 MG/100ML-% IV SOLN
INTRAVENOUS | Status: AC
  Filled 2020-06-13: qty 100

## 2020-06-13 MED ORDER — TRANEXAMIC ACID-NACL 1000-0.7 MG/100ML-% IV SOLN
1000.0000 mg | INTRAVENOUS | Status: AC
  Administered 2020-06-13: 1000 mg via INTRAVENOUS
  Filled 2020-06-13: qty 100

## 2020-06-13 MED ORDER — POVIDONE-IODINE 10 % EX SWAB: 2.0000 "application " | Freq: Once | CUTANEOUS | Status: DC

## 2020-06-13 MED ORDER — CYCLOBENZAPRINE HCL 10 MG PO TABS
10.0000 mg | ORAL_TABLET | Freq: Once | ORAL | Status: AC
  Administered 2020-06-13: 10 mg via ORAL
  Filled 2020-06-13: qty 1

## 2020-06-13 MED ORDER — PROPOFOL 10 MG/ML IV BOLUS
INTRAVENOUS | Status: DC | PRN
  Administered 2020-06-13: 100 mg via INTRAVENOUS

## 2020-06-13 MED ORDER — DOCUSATE SODIUM 100 MG PO CAPS
100.0000 mg | ORAL_CAPSULE | Freq: Two times a day (BID) | ORAL | Status: DC
  Administered 2020-06-14 – 2020-06-16 (×5): 100 mg via ORAL
  Filled 2020-06-13 (×6): qty 1

## 2020-06-13 MED ORDER — DEXAMETHASONE SODIUM PHOSPHATE 10 MG/ML IJ SOLN
INTRAMUSCULAR | Status: AC
  Filled 2020-06-13: qty 1

## 2020-06-13 MED ORDER — LIDOCAINE 2% (20 MG/ML) 5 ML SYRINGE
INTRAMUSCULAR | Status: DC | PRN
  Administered 2020-06-13: 40 mg via INTRAVENOUS

## 2020-06-13 MED ORDER — FERROUS SULFATE 325 (65 FE) MG PO TABS
325.0000 mg | ORAL_TABLET | Freq: Three times a day (TID) | ORAL | Status: DC
  Administered 2020-06-14 – 2020-06-16 (×9): 325 mg via ORAL
  Filled 2020-06-13 (×10): qty 1

## 2020-06-13 MED ORDER — FENTANYL CITRATE (PF) 250 MCG/5ML IJ SOLN
INTRAMUSCULAR | Status: AC
  Filled 2020-06-13: qty 5

## 2020-06-13 MED ORDER — ACETAMINOPHEN 10 MG/ML IV SOLN
1000.0000 mg | Freq: Once | INTRAVENOUS | Status: DC | PRN
  Administered 2020-06-13: 1000 mg via INTRAVENOUS

## 2020-06-13 SURGICAL SUPPLY — 47 items
ADH SKN CLS APL DERMABOND .7 (GAUZE/BANDAGES/DRESSINGS) ×1
BIT DRILL 4.3MMS DISTAL GRDTED (BIT) IMPLANT
CORTICAL BONE SCR 5.0MM X 46MM (Screw) ×2 IMPLANT
CORTICAL BONE SCR 5.0MM X 48MM (Screw) ×2 IMPLANT
COVER PERINEAL POST (MISCELLANEOUS) ×2 IMPLANT
COVER SURGICAL LIGHT HANDLE (MISCELLANEOUS) ×2 IMPLANT
COVER WAND RF STERILE (DRAPES) ×2 IMPLANT
DERMABOND ADVANCED (GAUZE/BANDAGES/DRESSINGS) ×1
DERMABOND ADVANCED .7 DNX12 (GAUZE/BANDAGES/DRESSINGS) ×1 IMPLANT
DRAPE INCISE IOBAN 66X45 STRL (DRAPES) ×1 IMPLANT
DRAPE STERI IOBAN 125X83 (DRAPES) ×2 IMPLANT
DRILL 4.3MMS DISTAL GRADUATED (BIT) ×2
DRSG AQUACEL AG ADV 3.5X 4 (GAUZE/BANDAGES/DRESSINGS) ×1 IMPLANT
DRSG AQUACEL AG ADV 3.5X 6 (GAUZE/BANDAGES/DRESSINGS) ×2 IMPLANT
DRSG MEPILEX BORDER 4X12 (GAUZE/BANDAGES/DRESSINGS) ×4 IMPLANT
DURAPREP 26ML APPLICATOR (WOUND CARE) ×2 IMPLANT
ELECT REM PT RETURN 9FT ADLT (ELECTROSURGICAL) ×2
ELECTRODE REM PT RTRN 9FT ADLT (ELECTROSURGICAL) ×1 IMPLANT
FACESHIELD WRAPAROUND (MASK) ×4 IMPLANT
FACESHIELD WRAPAROUND OR TEAM (MASK) ×2 IMPLANT
GLOVE BIO SURGEON STRL SZ7.5 (GLOVE) ×2 IMPLANT
GLOVE INDICATOR 7.5 STRL GRN (GLOVE) ×2 IMPLANT
GOWN STRL REUS W/ TWL LRG LVL3 (GOWN DISPOSABLE) ×2 IMPLANT
GOWN STRL REUS W/TWL 2XL LVL3 (GOWN DISPOSABLE) ×2 IMPLANT
GOWN STRL REUS W/TWL LRG LVL3 (GOWN DISPOSABLE) ×4
GUIDEPIN 3.2X17.5 THRD DISP (PIN) ×1 IMPLANT
GUIDEWIRE BALL NOSE 80CM (WIRE) ×2 IMPLANT
HIP FRAC NAIL LEFT 11X360MM (Orthopedic Implant) ×2 IMPLANT
KIT TURNOVER KIT B (KITS) ×2 IMPLANT
MANIFOLD NEPTUNE II (INSTRUMENTS) ×2 IMPLANT
NAIL HIP FRAC LEFT 11X360MM (Orthopedic Implant) IMPLANT
NS IRRIG 1000ML POUR BTL (IV SOLUTION) ×2 IMPLANT
PACK GENERAL/GYN (CUSTOM PROCEDURE TRAY) ×2 IMPLANT
PAD ARMBOARD 7.5X6 YLW CONV (MISCELLANEOUS) ×4 IMPLANT
SCREW BONE CORTICAL 5.0X40 (Screw) ×1 IMPLANT
SCREW BONE CORTICAL 5.0X42 (Screw) ×1 IMPLANT
SCREW CORTICL BON 5.0MM X 46MM (Screw) IMPLANT
SCREW CORTICL BON 5.0MM X 48MM (Screw) IMPLANT
SCREW LAG 10.5MMX105MM HFN (Screw) ×1 IMPLANT
SUT MNCRL AB 4-0 PS2 18 (SUTURE) ×2 IMPLANT
SUT VIC AB 1 CT1 27 (SUTURE) ×2
SUT VIC AB 1 CT1 27XBRD ANBCTR (SUTURE) ×1 IMPLANT
SUT VIC AB 2-0 CT1 27 (SUTURE) ×4
SUT VIC AB 2-0 CT1 TAPERPNT 27 (SUTURE) ×1 IMPLANT
TOWEL GREEN STERILE (TOWEL DISPOSABLE) ×2 IMPLANT
TOWEL GREEN STERILE FF (TOWEL DISPOSABLE) ×2 IMPLANT
WATER STERILE IRR 1000ML POUR (IV SOLUTION) ×2 IMPLANT

## 2020-06-13 NOTE — Anesthesia Preprocedure Evaluation (Addendum)
Anesthesia Evaluation  Patient identified by MRN, date of birth, ID band Patient awake    Reviewed: Allergy & Precautions, NPO status , Patient's Chart, lab work & pertinent test results  Airway Mallampati: III  TM Distance: >3 FB Neck ROM: Full    Dental no notable dental hx.    Pulmonary COPD,  COPD inhaler,    Pulmonary exam normal breath sounds clear to auscultation       Cardiovascular hypertension, Pt. on medications +CHF  Normal cardiovascular exam+ dysrhythmias Atrial Fibrillation  Rhythm:Regular Rate:Normal  ECG: a-fib   Neuro/Psych PSYCHIATRIC DISORDERS Anxiety Depression negative neurological ROS     GI/Hepatic Neg liver ROS, GERD  Medicated and Controlled,  Endo/Other  diabetes, Oral Hypoglycemic Agents  Renal/GU Renal disease     Musculoskeletal negative musculoskeletal ROS (+)   Abdominal (+) + obese,   Peds  Hematology  (+) Blood dyscrasia, , HLD   Anesthesia Other Findings LEFT INTERTROCHE FRACTURE  Reproductive/Obstetrics                           Anesthesia Physical Anesthesia Plan  ASA: III  Anesthesia Plan: General   Post-op Pain Management:    Induction: Intravenous  PONV Risk Score and Plan: 3 and Ondansetron, Dexamethasone, Treatment may vary due to age or medical condition and Propofol infusion  Airway Management Planned: Oral ETT  Additional Equipment:   Intra-op Plan:   Post-operative Plan: Extubation in OR  Informed Consent: I have reviewed the patients History and Physical, chart, labs and discussed the procedure including the risks, benefits and alternatives for the proposed anesthesia with the patient or authorized representative who has indicated his/her understanding and acceptance.     Dental advisory given  Plan Discussed with: CRNA  Anesthesia Plan Comments:      Anesthesia Quick Evaluation

## 2020-06-13 NOTE — Op Note (Signed)
NAME: [name], [name] MEDICAL RECORD [id number] ACCOUNT [id number] DATE OF BIRTH:06-09-1929 FACILITY: [facility] LOCATION: [facility] PHYSICIAN:Jesstin Studstill Marian Sorrow, MD  OPERATIVE REPORT  DATE OF PROCEDURE:  06/13/2020  PREOPERATIVE DIAGNOSIS:  Closed comminuted left intertrochanteric hip fracture.  POSTOPERATIVE DIAGNOSIS:  Closed comminuted left intertrochanteric hip fracture.  PROCEDURE:  Open reduction internal fixation of left hip fracture utilizing a Biomet trochanteric nail measuring 11 x 360 mm with a 105 mm lag screw and 2 distal interlocks.  SURGEON:  [name], MD  ASSISTANT:  Griffith Citron, PA-C.  Note that [name] was present for the entirety of the case from preoperative positioning, perioperative management of the operative extremity during the case, helping with maintenance of reduction, as well as preparing and instrumenting the femur, as  well as primary wound closure.  ANESTHESIA:  General.  ESTIMATED BLOOD LOSS:  100 mL.  COMPLICATIONS:  None.  DRAINS:  None.  INDICATIONS  FOR PROCEDURE:  The patient is an 86 year old female who had a ground level fall.  She had immediate onset of pain and inability to bear weight, was brought to the emergency room.  Radiographs revealed a comminuted intertrochanteric femur  fracture on the left.  She was seen and evaluated and the indications for surgery reviewed.  Risks of malunion, nonunion, need for future surgeries were reviewed, as well as risks of infection or DVT.  She was on preoperative Xarelto, which was last  dosed on 10/17 in  a.m.  She will resume Xarelto postoperatively.  Consent was obtained for management of her fracture.  PROCEDURE IN DETAIL:  The patient was brought to the operative theater.  Once adequate anesthesia, preoperative antibiotics, Ancef administered as well as tranexamic acid, she was positioned supine on the fracture table.  Her right unaffected extremity  was flexed and abducted out of the way  with bony prominences padded, particularly over the peroneal nerve.  The left foot was placed in the traction boot.  Once she was safely positioned and padded on the table, we slid her down to the perineal post.   Traction was then applied to the left lower extremity to reduce the fracture.  This was confirmed radiographically using fluoroscopy.  The fracture reduced into basically an anatomic position.  The left hip was then prepped and draped in sterile fashion  from above the iliac crest to below the knee for a long intramedullary nail.  Shower curtain technique was used.  A timeout was performed identifying the patient, the planned procedure and extremity.  Fluoroscopy was brought back to the field.  Using the  guidewire, the tip of the trochanter was identified.  A lateral incision was made through the gluteal fascia.  A guidewire was then inserted into the tip of the trochanter to the fracture site.  The proximal femur was then opened.  I then used the awl  to help direct the guidewire beyond the fracture site given the comminuted nature and subtrochanteric extension in the sagittal plane.  The guidewire was then passed through the awl to the distal physeal scar.  I measured and selected a 360 mm nail.  We  then reamed from 11 mm up to 12.5 mm.  The 11 x 360 mm nail was then placed on the insertion jig and passed into the femur, past the fracture site, over the guidewire.  The guidewire was removed.  We placed the nail to its appropriate depth.  Using the  insertion jig with a guide, the guidewire was inserted for the  lag screw into the center of the femoral head in AP and lateral planes.  I then measured and selected a 105 mm screw.  I drilled for the lag screw.  The lag screw was then placed into the  lateral cortex of the femur.  I then used the compression wheel and medialized the shaft to the intertrochanteric segment of this fracture.  Based on the comminuted nature of the fracture, I chose to  lock the nail in position.   It was tightened down  proximally.  We then removed the insertion jig.  At this point, we had traction off of the lower extremity and then abducted the leg slightly to allow for fluoroscopy to get into position for perfect circle technique.  Utilizing perfect circle technique,  2 distal interlocks were placed, 1 through the static and 1 through the dynamic slot.  Final radiographs were obtained in AP and lateral planes.  All wounds were irrigated.  The proximal wound was closed in layers using #1 Vicryl in the gluteal fascia  and then 2-0 Vicryl and Monocryl.  The remainder of the wounds were closed with 2-0 Vicryl and Monocryl.  All wounds were cleaned, dried and dressed sterilely using surgical glue and Aquacel dressing.  The patient was then brought to the recovery room in  stable condition, tolerating the procedure well.  Postoperatively, we will have her be partial weightbearing.  She will resume her Xarelto for DVT prophylaxis as well as for atrial fibrillation.  We will follow her in the hospital and then see her back  in the office in 2 week intervals for clinical and radiographic followup.  VN/NUANCE  D:06/13/2020 T:06/13/2020 JOB:[id number]

## 2020-06-13 NOTE — ED Provider Notes (Signed)
Case is discussed with Dr. Alvan Dame, on call for orthopedics. He agrees to see the patient in consult. Given anticoagulated state, will not do surgery until the afternoon of 10/18, or possible AM of 10/19.   Delora Fuel, MD 15/18/34 [phone]

## 2020-06-13 NOTE — Progress Notes (Addendum)
HD#1 Subjective:  Overnight Events: left lower extremity and lower back pain  Upon presentation, patient resting in bed with daughter at bedside. Patient states her pain is well controlled currently, but earlier she was in excruciating pain. She denies any lightheadedness, dizziness, fluttering in chest, at time of fall. States she tripped on bed sheets. She endorses orthopedics evaluating her and stating she would be taken to the OR today or tomorrow. She has no other complaints or concerns at this time.    Objective:  Vital signs in last 24 hours: Vitals:   06/13/20 0130 06/13/20 0451 06/13/20 0704 06/13/20 1151  BP: (!) 169/106 (!) 138/101 (!) 141/95 (!) 135/110  Pulse: 80 86 (!) 101   Resp: (!) 25 20 (!) 23   Temp:  98.3 F (36.8 C)    TempSrc:  Oral    SpO2: 96% 95% 97%   Weight:      Height:       Supplemental O2: Room Air SpO2: 97 %  Physical Exam:  Physical Exam Vitals and nursing note reviewed.  Constitutional:      General: She is not in acute distress.    Appearance: She is ill-appearing. She is not toxic-appearing.  Cardiovascular:     Rate and Rhythm: Normal rate. Rhythm irregular.     Pulses: Normal pulses.     Heart sounds: Normal heart sounds. No murmur heard.  No gallop.   Pulmonary:     Effort: Pulmonary effort is normal. No respiratory distress.  Musculoskeletal:     Comments: Left lower extremity externally rotated  Neurological:     General: No focal deficit present.     Mental Status: She is alert and oriented to person, place, and time. Mental status is at baseline.  Psychiatric:        Mood and Affect: Mood normal.        Behavior: Behavior normal.    Filed Weights   06/12/20 2120  Weight: 85.7 kg     Intake/Output Summary (Last 24 hours) at 06/13/2020 1247 Last data filed at 06/13/2020 0406 Gross per 24 hour  Intake 500 ml  Output 0 ml  Net 500 ml   Net IO Since Admission: 500 mL [06/13/20 1247]  Pertinent Labs: CBC Latest  Ref Rng & Units 06/13/2020 06/12/2020 06/12/2020  WBC 4.0 - 10.5 K/uL 12.5(H) - 6.7  Hemoglobin 12.0 - 15.0 g/dL 12.2 13.3 12.6  Hematocrit 36 - 46 % 38.2 39.0 39.8  Platelets 150 - 400 K/uL 239 - 258    CMP Latest Ref Rng & Units 06/13/2020 06/12/2020 06/12/2020  Glucose 70 - 99 mg/dL 256(H) 255(H) 267(H)  BUN 8 - 23 mg/dL 23 27(H) 23  Creatinine 0.44 - 1.00 mg/dL 1.33(H) 1.40(H) 1.44(H)  Sodium 135 - 145 mmol/L 137 138 136  Potassium 3.5 - 5.1 mmol/L 4.4 4.2 4.2  Chloride 98 - 111 mmol/L 104 103 103  CO2 22 - 32 mmol/L 24 - 24  Calcium 8.9 - 10.3 mg/dL 8.8(L) - 8.6(L)  Total Protein 6.5 - 8.1 g/dL - - -  Total Bilirubin 0.3 - 1.2 mg/dL - - -  Alkaline Phos 38 - 126 U/L - - -  AST 15 - 41 U/L - - -  ALT 0 - 44 U/L - - -   Imaging: DG Chest 1 View  Result Date: 06/12/2020 CLINICAL DATA:  Low back pain recent fall with chest pain, initial encounter EXAM: CHEST  1 VIEW COMPARISON:  05/12/2020 FINDINGS: Cardiac  shadow is stable. Aortic calcifications and tortuosity are seen. The lungs are clear bilaterally. No acute bony abnormality is seen. IMPRESSION: No acute abnormality noted. Electronically Signed   By: Inez Catalina M.D.   On: 06/12/2020 22:20   DG Lumbar Spine 2-3 Views  Result Date: 06/12/2020 CLINICAL DATA:  Low back pain following fall, initial encounter EXAM: LUMBAR SPINE - 3 VIEW COMPARISON:  09/17/2018 FINDINGS: Five lumbar type vertebral bodies are well visualized. Vertebral body height is well maintained. Mild degenerative anterolisthesis of L4 on L5 is noted. No other focal abnormality is seen. Right hip replacement is noted. IMPRESSION: Degenerative anterolisthesis of L4 on L5. This is stable from the previous exam. Electronically Signed   By: Inez Catalina M.D.   On: 06/12/2020 22:17   DG Knee 1-2 Views Left  Result Date: 06/12/2020 CLINICAL DATA:  Recent fall with left knee pain, initial encounter EXAM: LEFT KNEE - 2 VIEW COMPARISON:  None. FINDINGS: Degenerative  changes of the knee joint are seen. No acute fracture or dislocation is noted. IMPRESSION: Degenerative change without acute abnormality. Electronically Signed   By: Inez Catalina M.D.   On: 06/12/2020 22:22   CT Head Wo Contrast  Result Date: 06/12/2020 CLINICAL DATA:  86 year old female with head trauma. EXAM: CT HEAD WITHOUT CONTRAST CT CERVICAL SPINE WITHOUT CONTRAST TECHNIQUE: Multidetector CT imaging of the head and cervical spine was performed following the standard protocol without intravenous contrast. Multiplanar CT image reconstructions of the cervical spine were also generated. COMPARISON:  None. FINDINGS: CT HEAD FINDINGS Brain: There is moderate age-related atrophy and chronic microvascular ischemic changes. There is no acute intracranial hemorrhage. No mass effect or midline shift. No extra-axial fluid collection. Vascular: No hyperdense vessel or unexpected calcification. Skull: Normal. Negative for fracture or focal lesion. Sinuses/Orbits: Mild mucoperiosteal thickening of paranasal sinuses with partial opacification of the right sphenoid sinus. No air-fluid level. The mastoid air cells are clear. Other: None CT CERVICAL SPINE FINDINGS Alignment: No acute subluxation. Skull base and vertebrae: No acute fracture.  Osteopenia. Soft tissues and spinal canal: No prevertebral fluid or swelling. No visible canal hematoma. Disc levels:  Multilevel degenerative changes. Upper chest: Mild emphysema. Moderate atherosclerotic calcification of the aortic arch. The aortic arch is mildly dilated measuring up to 3.8 cm in diameter. Other: Bilateral carotid bulb calcified plaques. IMPRESSION: 1. No acute intracranial pathology. Moderate age-related atrophy and chronic microvascular ischemic changes. 2. No acute/traumatic cervical spine pathology. 3. Aortic Atherosclerosis (ICD10-I70.0). Electronically Signed   By: Anner Crete M.D.   On: 06/12/2020 21:37   CT Cervical Spine Wo Contrast  Result Date:  06/12/2020 CLINICAL DATA:  86 year old female with head trauma. EXAM: CT HEAD WITHOUT CONTRAST CT CERVICAL SPINE WITHOUT CONTRAST TECHNIQUE: Multidetector CT imaging of the head and cervical spine was performed following the standard protocol without intravenous contrast. Multiplanar CT image reconstructions of the cervical spine were also generated. COMPARISON:  None. FINDINGS: CT HEAD FINDINGS Brain: There is moderate age-related atrophy and chronic microvascular ischemic changes. There is no acute intracranial hemorrhage. No mass effect or midline shift. No extra-axial fluid collection. Vascular: No hyperdense vessel or unexpected calcification. Skull: Normal. Negative for fracture or focal lesion. Sinuses/Orbits: Mild mucoperiosteal thickening of paranasal sinuses with partial opacification of the right sphenoid sinus. No air-fluid level. The mastoid air cells are clear. Other: None CT CERVICAL SPINE FINDINGS Alignment: No acute subluxation. Skull base and vertebrae: No acute fracture.  Osteopenia. Soft tissues and spinal canal: No prevertebral fluid or swelling.  No visible canal hematoma. Disc levels:  Multilevel degenerative changes. Upper chest: Mild emphysema. Moderate atherosclerotic calcification of the aortic arch. The aortic arch is mildly dilated measuring up to 3.8 cm in diameter. Other: Bilateral carotid bulb calcified plaques. IMPRESSION: 1. No acute intracranial pathology. Moderate age-related atrophy and chronic microvascular ischemic changes. 2. No acute/traumatic cervical spine pathology. 3. Aortic Atherosclerosis (ICD10-I70.0). Electronically Signed   By: Anner Crete M.D.   On: 06/12/2020 21:37   DG Hip Unilat W or Wo Pelvis 2-3 Views Left  Result Date: 06/12/2020 CLINICAL DATA:  Recent fall with left hip pain, initial encounter EXAM: DG HIP (WITH OR WITHOUT PELVIS) 3V LEFT COMPARISON:  None. FINDINGS: Pelvic ring is intact. There is a comminuted intratrochanteric fracture of the  proximal left femur with impaction and angulation at the fracture site. Prior right hip replacement is seen. IMPRESSION: Comminuted intratrochanteric left femoral fracture. Electronically Signed   By: Inez Catalina M.D.   On: 06/12/2020 22:21    Assessment/Plan:   Principal Problem:   Closed comminuted intertrochanteric fracture of left femur (HCC) Active Problems:   Essential hypertension   CKD (chronic kidney disease) stage 3, GFR 30-59 ml/min (HCC)   Type 2 diabetes mellitus with hyperglycemia ([place])  Patient Summary: [name] is a 86 y.o. year old female with hx of A fib on xarelto, HFpEF, DM, HTN, HLD, COPD, CKD 3, GERD, depression, anxiety, legally blind in R eye presenting for fall with L hip pain. Xray with comminuted intratrochanteric left femoral fracture, ortho to see patient.   Comminuted Intertrochanteric Fracture of The Proximal Left Femur Mechanical fall Xray with comminuted intratrochanteric left femoral fracture. Imaging of L knee, spine, and head are benign. On exam, LLE is shortened compared to the R and externally rotated  -orthopedic surgery consulted, OR today for ORIF this evening -pain control with dilaudid .5-1 IV q2h PRN mod pain, acetaminophen 650 mg q6h prn mild pain -miralax prn constipation -NPO for surgery today -consider bone density scan   Chronic A fib without RVR Reports compliance wit Xarelto. Currently in afib. Rate is currently controlled, but known rate agent at home. -hold Xarelto, start heparin in preparation for surgery today.   Chronic HFpEF Most recent echo from 07/16/2017 with EF 65-70%, unable to assess diastolic function due to A fib. On Lasix 20mg  daily prn at home. No signs of volume overload on exam -holding home prn Lasix for now given euvolemic status   DM with peripheral neuropathy Last A1c in 2018 in 8.0. -f/u A1c -start SSI -continue home gabapentin after surgery   HTN On amlodipine 5mg  daily at home. BP controlled on  admission. -continue home meds   CKD 3b Creatinine 1.40 on admission which is around her baseline. -daily BMP -avoid nephrotoxins   HLD On pravastatin 40mg  daily at home. -resume by time of discharge  Diet: NPO IVF: None,None VTE: Heparin Code: Full PT/OT recs: Pending  Dispo: Anticipated discharge to Skilled nursing facility in 3 days pending left intertrochanteric femur fracture repair.   Sanjuana Letters DO Internal Medicine Resident PGY-1 Pager [phone] Please contact the on call pager after 5 pm and on weekends at [phone].

## 2020-06-13 NOTE — Progress Notes (Signed)
Patient ID: [name], female   DOB: 16-Jan-1929, 86 y.o.   MRN: [id number]  Consulted on this 86 yo female status post fall with comminuted left intertrochanteric hip fracture  Full note to follow Will require ORIF of left hip today versus tomorrow  Medical admission for initial stabilization and optimization

## 2020-06-13 NOTE — Anesthesia Postprocedure Evaluation (Signed)
Anesthesia Post Note  Patient: [name]  Procedure(s) Performed: INTRAMEDULLARY (IM) NAIL INTERTROCHANTRIC (Left )     Patient location during evaluation: PACU Anesthesia Type: General Level of consciousness: awake and alert Pain management: pain level controlled Vital Signs Assessment: post-procedure vital signs reviewed and stable Respiratory status: spontaneous breathing, nonlabored ventilation and respiratory function stable Cardiovascular status: blood pressure returned to baseline and stable Postop Assessment: no apparent nausea or vomiting Anesthetic complications: no   No complications documented.  Last Vitals:  Vitals:   06/13/20 1400 06/13/20 1745  BP: 114/84 118/81  Pulse: (!) 103 90  Resp: (!) 22 10  Temp:  36.4 C  SpO2: 94% 93%    Last Pain:  Vitals:   06/13/20 1759  TempSrc:   PainSc: 9                  Dayson Aboud E Jeannemarie Sawaya

## 2020-06-13 NOTE — Progress Notes (Signed)
ANTICOAGULATION CONSULT NOTE - Initial Consult  Pharmacy Consult for Heparin Indication: VTE prophylaxis  Allergies  Allergen Reactions   Hydromorphone Hcl Other (See Comments)    Patient goes out of right state of mind.     Patient Measurements: Height: 5\' 2"  (157.5 cm) Weight: 85.7 kg (189 lb) IBW/kg (Calculated) : 50.1  Vital Signs: Temp: 97.6 F (36.4 C) (10/18 1745) BP: 93/76 (10/18 1945) Pulse Rate: 97 (10/18 1945)  Labs: Recent Labs    06/12/20 2112 06/12/20 2112 06/12/20 2138 06/13/20 0414  HGB 12.6   < > 13.3 12.2  HCT 39.8  --  39.0 38.2  PLT 258  --   --  239  CREATININE 1.44*  --  1.40* 1.33*   < > = values in this interval not displayed.    Estimated Creatinine Clearance: 29.1 mL/min (A) (by C-G formula based on SCr of 1.33 mg/dL (H)).   Medical History: Past Medical History:  Diagnosis Date   Acid reflux    Anxiety    CKD (chronic kidney disease)    COPD (chronic obstructive pulmonary disease) (HCC)    Depressed    Diabetes mellitus    Glaucoma    Hypercholesteremia    Hypertension    Legally blind in right eye, as defined in [place]    Panic attacks    Type 2 diabetes mellitus [facility])     Assessment: 86 yr old female admitted on 10/17 with L hip fracture; pt is S/P ortho surgery this afternoon. Pt was on Xarelto PTA for atrial fibrillation (last dose on 10/17 AM, CrCl 29.1 ml/min). Pharmacy was consulted to dose IV heparin while Xarelto was on hold prior to surgery. Pt was started on heparin infusion at 950 units/hr, which was discontinued earlier today for surgery (no aPTT or heparin level drawn). Pt has signed and held orders to restart Xarelto tomorrow (10/19). Pharmacy was consulted to dose heparin for VTE prophylaxis.   H/H, platelets WNL. Per PACU RN, no issues with bleeding post op.  Goal of Therapy:  Prevention of VTE Monitor platelets by anticoagulation protocol: Yes   Plan:  Heparin 5000 units SQ Q 8 hrs, starting at  ~2300 tonight Monitor CBC Monitor for signs/symptoms of bleeding F/U restarting Xarelto tomorrow (d/c SQ heparin if restarts Xarelto)  Gillermina Hu, PharmD, BCPS, [facility] Clinical Pharmacist 06/13/2020,8:14 PM

## 2020-06-13 NOTE — Consult Note (Addendum)
ORTHOPAEDIC CONSULTATION  REQUESTING PHYSICIAN: Lucious Groves, DO  PCP:  Celene Squibb, MD  Chief Complaint: Fall with left hip pain  HPI: [name] is a 86 y.o. female who presented to [facility] ED on 06/12/20 after a fall that evening. She reports that she tripped at home, and fell onto her left side. She does report hitting her head on a piece of furniture, but denies LOC. She denies other injuries. She does have a history of atrial fibrillation, on Xarelto 15 mg daily. Imaging in the ED revealed comminuted left intertrochanteric femur fracture. Dr. Alvan Dame was consulted for orthopaedic evaluation and management.  LLE pain No other complaints of injury    Past Medical History:  Diagnosis Date  . Acid reflux   . Anxiety   . CKD (chronic kidney disease)   . COPD (chronic obstructive pulmonary disease) ([place])   . Depressed   . Diabetes mellitus   . Glaucoma   . Hypercholesteremia   . Hypertension   . Legally blind in right eye, as defined in [place]   . Panic attacks   . Type 2 diabetes mellitus ([place])    Past Surgical History:  Procedure Laterality Date  . APPENDECTOMY    . BALLOON DILATION  07/16/2017   Procedure: BALLOON DILATION;  Surgeon: Danie Binder, MD;  [facility];  Service: Endoscopy;;  . cataracts    . CHOLECYSTECTOMY    . ERCP N/A 07/16/2017   biliary papillary stenosis, filling defect c/w a stone and sludge, entire biliary tree dilated. completed biliary sphincterotomy and balloon extraction, stone removal  . ESOPHAGOGASTRODUODENOSCOPY  2006   Dr. Gala Romney: normal esophagus s/p empiric dilataton, small hiatal hernia  . HIP SURGERY     rod in leg as well  . MASS EXCISION Left 09/26/2017   Procedure: EXCISION MUCOID CYST WITH DISTAL INTERPHALANGEAL JOINT ARTHROTOMY OF LEFT MIDDLE FINGER;  Surgeon: Leanora Cover, MD;  Location: [facility];  Service: Orthopedics;  Laterality: Left;  . SPHINCTEROTOMY  07/16/2017   Procedure:  SPHINCTEROTOMY;  Surgeon: Danie Binder, MD;  Location[facility];  Service: Endoscopy;;  . TUBAL LIGATION     Social History   Socioeconomic History  . Marital status: Divorced    Spouse name: Not on file  . Number of children: Not on file  . Years of education: Not on file  . Highest education level: Not on file  Occupational History  . Not on file  Tobacco Use  . Smoking status: Never Smoker  . Smokeless tobacco: Never Used  Vaping Use  . Vaping Use: Never used  Substance and Sexual Activity  . Alcohol use: No  . Drug use: No  . Sexual activity: Never  Other Topics Concern  . Not on file  Social History Narrative  . Not on file   Social Determinants of Health   Financial Resource Strain:   . Difficulty of Paying Living Expenses: Not on file  Food Insecurity:   . Worried About Charity fundraiser in the Last Year: Not on file  . Ran Out of Food in the Last Year: Not on file  Transportation Needs:   . Lack of Transportation (Medical): Not on file  . Lack of Transportation (Non-Medical): Not on file  Physical Activity:   . Days of Exercise per Week: Not on file  . Minutes of Exercise per Session: Not on file  Stress:   . Feeling of Stress :  Not on file  Social Connections:   . Frequency of Communication with Friends and Family: Not on file  . Frequency of Social Gatherings with Friends and Family: Not on file  . Attends Religious Services: Not on file  . Active Member of Clubs or Organizations: Not on file  . Attends Archivist Meetings: Not on file  . Marital Status: Not on file   Family History  Problem Relation Age of Onset  . Blindness Father   . Bone cancer Father   . Brain cancer Sister   . Alzheimer's disease Brother   . Colon cancer Neg Hx    Allergies  Allergen Reactions  . Hydromorphone Hcl Other (See Comments)    Patient goes out of right state of mind.    Prior to Admission medications   Medication Sig Start Date End Date  Taking? Authorizing Provider  amLODipine (NORVASC) 5 MG tablet Take 5 mg by mouth daily.     Yes [provider]  Difluprednate (DUREZOL) 0.05 % EMUL Apply 1 drop to eye daily.   Yes [provider]  furosemide (LASIX) 20 MG tablet Take 20 mg daily as needed by mouth for fluid.   Yes [provider]  gabapentin (NEURONTIN) 100 MG capsule TAKE 1 CAPSULE BY MOUTH THREE TIMES A DAY Patient taking differently: Take 100 mg by mouth 3 (three) times daily.  03/31/20  Yes Carole Civil, MD  glipiZIDE (GLUCOTROL) 5 MG tablet Take 5 mg 2 (two) times daily by mouth.    Yes [provider]  LOKELMA 10 g PACK packet Take 1 packet by mouth daily. 03/17/20  Yes [provider]  omeprazole (PRILOSEC) 20 MG capsule Take 20 mg by mouth daily.     Yes [provider]  ondansetron (ZOFRAN) 4 MG tablet Take 4 mg by mouth every 8 (eight) hours as needed for nausea/vomiting. 05/13/20  Yes [provider]  pravastatin (PRAVACHOL) 40 MG tablet Take 40 mg by mouth daily.     Yes [provider]  traMADol (ULTRAM) 50 MG tablet Take 1 tablet (50 mg total) by mouth every 12 (twelve) hours as needed for severe pain. 10/13/19  Yes [place], [place], PA-C  TRESIBA FLEXTOUCH 100 UNIT/ML FlexTouch Pen Inject 10 Units into the skin daily.  12/08/19  Yes [provider]  XARELTO 15 MG TABS tablet Take 15 mg by mouth daily. 12/01/19  Yes [provider]   DG Chest 1 View  Result Date: 06/12/2020 CLINICAL DATA:  Low back pain recent fall with chest pain, initial encounter EXAM: CHEST  1 VIEW COMPARISON:  05/12/2020 FINDINGS: Cardiac shadow is stable. Aortic calcifications and tortuosity are seen. The lungs are clear bilaterally. No acute bony abnormality is seen. IMPRESSION: No acute abnormality noted. Electronically Signed   By: Inez Catalina M.D.   On: 06/12/2020 22:20   DG Lumbar Spine 2-3 Views  Result Date: 06/12/2020 CLINICAL DATA:  Low back  pain following fall, initial encounter EXAM: LUMBAR SPINE - 3 VIEW COMPARISON:  09/17/2018 FINDINGS: Five lumbar type vertebral bodies are well visualized. Vertebral body height is well maintained. Mild degenerative anterolisthesis of L4 on L5 is noted. No other focal abnormality is seen. Right hip replacement is noted. IMPRESSION: Degenerative anterolisthesis of L4 on L5. This is stable from the previous exam. Electronically Signed   By: Inez Catalina M.D.   On: 06/12/2020 22:17   DG Knee 1-2 Views Left  Result Date: 06/12/2020 CLINICAL DATA:  Recent  fall with left knee pain, initial encounter EXAM: LEFT KNEE - 2 VIEW COMPARISON:  None. FINDINGS: Degenerative changes of the knee joint are seen. No acute fracture or dislocation is noted. IMPRESSION: Degenerative change without acute abnormality. Electronically Signed   By: Inez Catalina M.D.   On: 06/12/2020 22:22   CT Head Wo Contrast  Result Date: 06/12/2020 CLINICAL DATA:  86 year old female with head trauma. EXAM: CT HEAD WITHOUT CONTRAST CT CERVICAL SPINE WITHOUT CONTRAST TECHNIQUE: Multidetector CT imaging of the head and cervical spine was performed following the standard protocol without intravenous contrast. Multiplanar CT image reconstructions of the cervical spine were also generated. COMPARISON:  None. FINDINGS: CT HEAD FINDINGS Brain: There is moderate age-related atrophy and chronic microvascular ischemic changes. There is no acute intracranial hemorrhage. No mass effect or midline shift. No extra-axial fluid collection. Vascular: No hyperdense vessel or unexpected calcification. Skull: Normal. Negative for fracture or focal lesion. Sinuses/Orbits: Mild mucoperiosteal thickening of paranasal sinuses with partial opacification of the right sphenoid sinus. No air-fluid level. The mastoid air cells are clear. Other: None CT CERVICAL SPINE FINDINGS Alignment: No acute subluxation. Skull base and vertebrae: No acute fracture.  Osteopenia. Soft  tissues and spinal canal: No prevertebral fluid or swelling. No visible canal hematoma. Disc levels:  Multilevel degenerative changes. Upper chest: Mild emphysema. Moderate atherosclerotic calcification of the aortic arch. The aortic arch is mildly dilated measuring up to 3.8 cm in diameter. Other: Bilateral carotid bulb calcified plaques. IMPRESSION: 1. No acute intracranial pathology. Moderate age-related atrophy and chronic microvascular ischemic changes. 2. No acute/traumatic cervical spine pathology. 3. Aortic Atherosclerosis (ICD10-I70.0). Electronically Signed   By: Anner Crete M.D.   On: 06/12/2020 21:37   CT Cervical Spine Wo Contrast  Result Date: 06/12/2020 CLINICAL DATA:  86 year old female with head trauma. EXAM: CT HEAD WITHOUT CONTRAST CT CERVICAL SPINE WITHOUT CONTRAST TECHNIQUE: Multidetector CT imaging of the head and cervical spine was performed following the standard protocol without intravenous contrast. Multiplanar CT image reconstructions of the cervical spine were also generated. COMPARISON:  None. FINDINGS: CT HEAD FINDINGS Brain: There is moderate age-related atrophy and chronic microvascular ischemic changes. There is no acute intracranial hemorrhage. No mass effect or midline shift. No extra-axial fluid collection. Vascular: No hyperdense vessel or unexpected calcification. Skull: Normal. Negative for fracture or focal lesion. Sinuses/Orbits: Mild mucoperiosteal thickening of paranasal sinuses with partial opacification of the right sphenoid sinus. No air-fluid level. The mastoid air cells are clear. Other: None CT CERVICAL SPINE FINDINGS Alignment: No acute subluxation. Skull base and vertebrae: No acute fracture.  Osteopenia. Soft tissues and spinal canal: No prevertebral fluid or swelling. No visible canal hematoma. Disc levels:  Multilevel degenerative changes. Upper chest: Mild emphysema. Moderate atherosclerotic calcification of the aortic arch. The aortic arch is mildly  dilated measuring up to 3.8 cm in diameter. Other: Bilateral carotid bulb calcified plaques. IMPRESSION: 1. No acute intracranial pathology. Moderate age-related atrophy and chronic microvascular ischemic changes. 2. No acute/traumatic cervical spine pathology. 3. Aortic Atherosclerosis (ICD10-I70.0). Electronically Signed   By: Anner Crete M.D.   On: 06/12/2020 21:37   DG Hip Unilat W or Wo Pelvis 2-3 Views Left  Result Date: 06/12/2020 CLINICAL DATA:  Recent fall with left hip pain, initial encounter EXAM: DG HIP (WITH OR WITHOUT PELVIS) 3V LEFT COMPARISON:  None. FINDINGS: Pelvic ring is intact. There is a comminuted intratrochanteric fracture of the proximal left femur with impaction and angulation at the fracture site. Prior right hip replacement is seen.  IMPRESSION: Comminuted intratrochanteric left femoral fracture. Electronically Signed   By: Inez Catalina M.D.   On: 06/12/2020 22:21    Positive ROS: All other systems have been reviewed and were otherwise negative with the exception of those mentioned in the HPI and as above.  Physical Exam: General: Alert, no acute distress Cardiovascular: No pedal edema Respiratory: No cyanosis, no use of accessory musculature GI: No organomegaly, abdomen is soft and non-tender Skin: No lesions in the area of chief complaint Neurologic: Sensation intact distally Psychiatric: Patient is competent for consent with normal mood and affect Lymphatic: No axillary or cervical lymphadenopathy  MUSCULOSKELETAL:   Left Hip Exam: Skin intact. Left leg shortened. ROM not assessed due to pain. Able to move ankle and toes without difficulty. Distal pulses 2+, sensation intact.   Assessment: Closed comminuted left intertrochanteric femur fracture  Plan: Discussed diagnosis of left intertrochanteric femur fracture, as well as plan for surgical treatment with ORIF. Dr. Alvan Dame will plan on left hip intramedullary nail for this evening. He discussed risks,  benefits, and expectations of this procedure with her. She will remain NPO. Hold Xarelto.    Maurice March, PA-C Cell [phone]   06/13/2020 12:12 PM

## 2020-06-13 NOTE — Progress Notes (Signed)
PATIENT REQUESTS FULL CODE STATUS WITH ONLY ONE ROUND OF ACLS PROTOCOL IF CARDIOPULMONARY EVENT OCCURS. DOES NOT WISH TO BE ON LIFE SUPPORT.

## 2020-06-13 NOTE — Progress Notes (Signed)
ANTICOAGULATION CONSULT NOTE - Initial Consult  Pharmacy Consult for heparin Indication: atrial fibrillation  Allergies  Allergen Reactions  . Hydromorphone Hcl Other (See Comments)    Patient goes out of right state of mind.     Patient Measurements: Height: 5\' 2"  (157.5 cm) Weight: 85.7 kg (189 lb) IBW/kg (Calculated) : 50.1 Heparin Dosing Weight: 69.6  Vital Signs: Temp: 97.9 F (36.6 C) (10/17 2121) Temp Source: Oral (10/17 2121) BP: 137/89 (10/17 2345) Pulse Rate: 87 (10/17 2345)  Labs: Recent Labs    06/12/20 2112 06/12/20 2138  HGB 12.6 13.3  HCT 39.8 39.0  PLT 258  --   CREATININE 1.44* 1.40*    Estimated Creatinine Clearance: 27.7 mL/min (A) (by C-G formula based on SCr of 1.4 mg/dL (H)).   Medical History: Past Medical History:  Diagnosis Date  . Acid reflux   . Anxiety   . CKD (chronic kidney disease)   . COPD (chronic obstructive pulmonary disease) ([place])   . Depressed   . Diabetes mellitus   . Glaucoma   . Hypercholesteremia   . Hypertension   . Legally blind in right eye, as defined in [place]   . Panic attacks   . Type 2 diabetes mellitus (HCC)     Medications:  See medication history  Assessment: 86 yo lady to start heparin while xarelto on hold for afib. She took her last dose 10/17 am.  Hg 13.3, PTLC 258 Goal of Therapy:  Heparin level 0.3-0.7 units/ml aPTT 66-102 seconds Monitor platelets by anticoagulation protocol: Yes   Plan:  Start heparin drip in am at 950 units/ml Check aPTT and heparin level 6-8 hours after start Monitor for bleeding complications  Suhaas Agena Poteet 06/13/2020,12:45 AM

## 2020-06-13 NOTE — Anesthesia Procedure Notes (Addendum)
Procedure Name: Intubation Date/Time: 06/13/2020 4:06 PM Performed by: Mariea Clonts, CRNA Pre-anesthesia Checklist: Patient identified, Emergency Drugs available, Suction available and Patient being monitored Patient Re-evaluated:Patient Re-evaluated prior to induction Oxygen Delivery Method: Circle System Utilized Preoxygenation: Pre-oxygenation with 100% oxygen Induction Type: IV induction Ventilation: Mask ventilation without difficulty Laryngoscope Size: Miller and 2 Grade View: Grade I Tube type: Oral Tube size: 7.0 mm Number of attempts: 1 Airway Equipment and Method: Stylet and Oral airway Placement Confirmation: ETT inserted through vocal cords under direct vision,  positive ETCO2 and breath sounds checked- equal and bilateral Tube secured with: Tape Dental Injury: Teeth and Oropharynx as per pre-operative assessment

## 2020-06-13 NOTE — Transfer of Care (Signed)
Immediate Anesthesia Transfer of Care Note  Patient: [name]  Procedure(s) Performed: INTRAMEDULLARY (IM) NAIL INTERTROCHANTRIC (Left )  Patient Location: PACU  Anesthesia Type:General  Level of Consciousness: awake  Airway & Oxygen Therapy: Patient Spontanous Breathing  Post-op Assessment: Report given to RN  Post vital signs: Reviewed and stable  Last Vitals:  Vitals Value Taken Time  BP 118/81 06/13/20 1743  Temp    Pulse 81 06/13/20 1743  Resp 9 06/13/20 1743  SpO2 98 % 06/13/20 1743  Vitals shown include unvalidated device data.  Last Pain:  Vitals:   06/13/20 1446  TempSrc:   PainSc: 5          Complications: No complications documented.

## 2020-06-13 NOTE — Brief Op Note (Signed)
06/12/2020 - 06/13/2020  5:16 PM  PATIENT:  [name]  86 y.o. female  PRE-OPERATIVE DIAGNOSIS:  Closed comminuted left intertrochanteric hip fracture  POST-OPERATIVE DIAGNOSIS:  Closed comminuted left intertrochanteric hip fracture  PROCEDURE:  Procedure(s): INTRAMEDULLARY (IM) NAIL INTERTROCHANTRIC (Left)  SURGEON:  Surgeon(s) and Role:    [name], MD - Primary  PHYSICIAN ASSISTANT: Griffith Citron, PA-C  ANESTHESIA:   general  EBL:  100 mL   BLOOD ADMINISTERED:none  DRAINS: none   LOCAL MEDICATIONS USED:  NONE  SPECIMEN:  No Specimen  DISPOSITION OF SPECIMEN:  N/A  COUNTS:  YES  TOURNIQUET:  * No tourniquets in log *  DICTATION: .Other Dictation: Dictation Number [id number]  PLAN OF CARE: Admit to inpatient   PATIENT DISPOSITION:  PACU - hemodynamically stable.   Delay start of Pharmacological VTE agent (>24hrs) due to surgical blood loss or risk of bleeding: no

## 2020-06-14 ENCOUNTER — Encounter (HOSPITAL_COMMUNITY): Payer: Self-pay | Admitting: Internal Medicine

## 2020-06-14 DIAGNOSIS — E559 Vitamin D deficiency, unspecified: Secondary | ICD-10-CM | POA: Diagnosis present

## 2020-06-14 LAB — BASIC METABOLIC PANEL
Anion gap: 10 (ref 5–15)
BUN: 24 mg/dL — ABNORMAL HIGH (ref 8–23)
CO2: 22 mmol/L (ref 22–32)
Calcium: 8.3 mg/dL — ABNORMAL LOW (ref 8.9–10.3)
Chloride: 104 mmol/L (ref 98–111)
Creatinine, Ser: 1.5 mg/dL — ABNORMAL HIGH (ref 0.44–1.00)
GFR, Estimated: 31 mL/min — ABNORMAL LOW (ref >60)
Glucose, Bld: 238 mg/dL — ABNORMAL HIGH (ref 70–99)
Potassium: 4.5 mmol/L (ref 3.5–5.1)
Sodium: 136 mmol/L (ref 135–145)

## 2020-06-14 LAB — CBC
HCT: 32.2 % — ABNORMAL LOW (ref 36.0–46.0)
Hemoglobin: 10.4 g/dL — ABNORMAL LOW (ref 12.0–15.0)
MCH: 29.4 pg (ref 26.0–34.0)
MCHC: 32.3 g/dL (ref 30.0–36.0)
MCV: 91 fL (ref 80.0–100.0)
Platelets: 224 10*3/uL (ref 150–400)
RBC: 3.54 MIL/uL — ABNORMAL LOW (ref 3.87–5.11)
RDW: 14.2 % (ref 11.5–15.5)
WBC: 12.2 10*3/uL — ABNORMAL HIGH (ref 4.0–10.5)
nRBC: 0 % (ref 0.0–0.2)

## 2020-06-14 LAB — GLUCOSE, CAPILLARY
Glucose-Capillary: 244 mg/dL — ABNORMAL HIGH (ref 70–99)
Glucose-Capillary: 195 mg/dL — ABNORMAL HIGH (ref 70–99)
Glucose-Capillary: 210 mg/dL — ABNORMAL HIGH (ref 70–99)
Glucose-Capillary: 163 mg/dL — ABNORMAL HIGH (ref 70–99)
Glucose-Capillary: 195 mg/dL — ABNORMAL HIGH (ref 70–99)

## 2020-06-14 LAB — HEMOGLOBIN A1C
Hgb A1c MFr Bld: 7.5 % — ABNORMAL HIGH (ref 4.8–5.6)
Mean Plasma Glucose: 169 mg/dL

## 2020-06-14 LAB — VITAMIN D 25 HYDROXY (VIT D DEFICIENCY, FRACTURES): Vit D, 25-Hydroxy: 16.13 ng/mL — ABNORMAL LOW (ref 30–100)

## 2020-06-14 MED ORDER — INSULIN GLARGINE 100 UNIT/ML ~~LOC~~ SOLN
15.0000 [IU] | Freq: Every evening | SUBCUTANEOUS | Status: DC
  Administered 2020-06-14 – 2020-06-16 (×3): 15 [IU] via SUBCUTANEOUS
  Filled 2020-06-14 (×3): qty 0.15

## 2020-06-14 MED ORDER — RIVAROXABAN 15 MG PO TABS
15.0000 mg | ORAL_TABLET | Freq: Every day | ORAL | Status: DC
  Filled 2020-06-14: qty 1

## 2020-06-14 MED ORDER — RIVAROXABAN 15 MG PO TABS
15.0000 mg | ORAL_TABLET | Freq: Every day | ORAL | Status: DC
  Administered 2020-06-14 – 2020-06-16 (×3): 15 mg via ORAL
  Filled 2020-06-14 (×3): qty 1

## 2020-06-14 MED ORDER — METOCLOPRAMIDE HCL 5 MG/ML IJ SOLN: 5.0000 mg | Freq: Three times a day (TID) | INTRAMUSCULAR | Status: DC | PRN

## 2020-06-14 MED ORDER — HYDROCODONE-ACETAMINOPHEN 5-325 MG PO TABS
1.0000 | ORAL_TABLET | ORAL | Status: DC | PRN
  Administered 2020-06-14: 1 via ORAL
  Administered 2020-06-14: 2 via ORAL
  Administered 2020-06-14 – 2020-06-16 (×8): 1 via ORAL
  Filled 2020-06-14 (×8): qty 1
  Filled 2020-06-14: qty 2
  Filled 2020-06-14: qty 1

## 2020-06-14 MED ORDER — GLIPIZIDE 5 MG PO TABS
5.0000 mg | ORAL_TABLET | Freq: Two times a day (BID) | ORAL | Status: DC
  Administered 2020-06-14 – 2020-06-16 (×6): 5 mg via ORAL
  Filled 2020-06-14 (×6): qty 1

## 2020-06-14 MED ORDER — MORPHINE SULFATE (PF) 2 MG/ML IV SOLN
0.5000 mg | INTRAVENOUS | Status: DC | PRN
  Filled 2020-06-14: qty 1

## 2020-06-14 MED ORDER — POLYETHYLENE GLYCOL 3350 17 G PO PACK: 17.0000 g | PACK | Freq: Every day | ORAL | Status: DC | PRN

## 2020-06-14 MED ORDER — PHENOL 1.4 % MT LIQD: 1.0000 | OROMUCOSAL | Status: DC | PRN

## 2020-06-14 MED ORDER — METOCLOPRAMIDE HCL 5 MG PO TABS: 5.0000 mg | ORAL_TABLET | Freq: Three times a day (TID) | ORAL | Status: DC | PRN

## 2020-06-14 MED ORDER — ONDANSETRON HCL 4 MG PO TABS: 4.0000 mg | ORAL_TABLET | Freq: Four times a day (QID) | ORAL | Status: DC | PRN

## 2020-06-14 MED ORDER — ACETAMINOPHEN 325 MG PO TABS
325.0000 mg | ORAL_TABLET | Freq: Four times a day (QID) | ORAL | Status: DC | PRN
  Administered 2020-06-14 – 2020-06-16 (×3): 650 mg via ORAL
  Filled 2020-06-14 (×3): qty 2

## 2020-06-14 MED ORDER — ONDANSETRON HCL 4 MG/2ML IJ SOLN: 4.0000 mg | Freq: Four times a day (QID) | INTRAMUSCULAR | Status: DC | PRN

## 2020-06-14 MED ORDER — VITAMIN D 25 MCG (1000 UNIT) PO TABS
1000.0000 [IU] | ORAL_TABLET | Freq: Every day | ORAL | Status: DC
  Administered 2020-06-14 – 2020-06-15 (×2): 1000 [IU] via ORAL
  Filled 2020-06-14 (×2): qty 1

## 2020-06-14 MED ORDER — MENTHOL 3 MG MT LOZG: 1.0000 | LOZENGE | OROMUCOSAL | Status: DC | PRN

## 2020-06-14 MED ORDER — FUROSEMIDE 20 MG PO TABS: 20.0000 mg | ORAL_TABLET | Freq: Every day | ORAL | Status: DC | PRN

## 2020-06-14 NOTE — Progress Notes (Signed)
HD#2 Subjective:  Overnight Events: None  States she feels "real sad" be cause her son is dying from cancer and she can't see him. States she feels better now becauseshe just reeived pain medicine.   Normally walks with walker and also states she "furniture walks" but she knows she should not do this. Discussed working with physial and occupational therapy. Daughter inquires about home health for recovery as she worries about her mother and COVID exposure. Patient is not currently vaccinated. Daughter believes she and additional family member can take care of the patient. Dr. Heber Florence voiced that physical therapy does not necessarily work with patient's daily with home heath. Also reiterated that   Patient diagnosed with COVID-19 infection on 9/10. Received monoclonal antibody treatment. Would like to receive the vaccine once she is stronger.   Objective:  Vital signs in last 24 hours: Vitals:   06/13/20 2350 06/14/20 0020 06/14/20 0052 06/14/20 0500  BP: 129/73 135/87 130/84 100/69  Pulse: 90 88 100 99  Resp: 16 19 19 18   Temp:  97.7 F (36.5 C) 97.7 F (36.5 C) 98.6 F (37 C)  TempSrc:   Oral Oral  SpO2: 97% 98% 98% 97%  Weight:      Height:       Supplemental O2: Nasal Cannula SpO2: 97 % O2 Flow Rate (L/min): 2 L/min   Physical Exam:  Physical Exam Vitals and nursing note reviewed.  Constitutional:      Appearance: She is not toxic-appearing.     Comments: Tearful when talking about son  Cardiovascular:     Rate and Rhythm: Normal rate. Rhythm irregular.     Heart sounds: No murmur heard.  No gallop.   Pulmonary:     Effort: Pulmonary effort is normal. No respiratory distress.  Musculoskeletal:     Comments: Left lower extremity wrapped with dressing   Neurological:     General: No focal deficit present.     Mental Status: She is alert and oriented to person, place, and time.     Filed Weights   06/12/20 2120  Weight: 85.7 kg     Intake/Output Summary  (Last 24 hours) at 06/14/2020 0717 Last data filed at 06/14/2020 0500 Gross per 24 hour  Intake 1798.23 ml  Output 600 ml  Net 1198.23 ml   Net IO Since Admission: 1,698.23 mL [06/14/20 0717]  Pertinent Labs: CBC Latest Ref Rng & Units 06/14/2020 06/13/2020 06/12/2020  WBC 4.0 - 10.5 K/uL 12.2(H) 12.5(H) -  Hemoglobin 12.0 - 15.0 g/dL 10.4(L) 12.2 13.3  Hematocrit 36 - 46 % 32.2(L) 38.2 39.0  Platelets 150 - 400 K/uL 224 239 -    CMP Latest Ref Rng & Units 06/14/2020 06/13/2020 06/12/2020  Glucose 70 - 99 mg/dL 238(H) 256(H) 255(H)  BUN 8 - 23 mg/dL 24(H) 23 27(H)  Creatinine 0.44 - 1.00 mg/dL 1.50(H) 1.33(H) 1.40(H)  Sodium 135 - 145 mmol/L 136 137 138  Potassium 3.5 - 5.1 mmol/L 4.5 4.4 4.2  Chloride 98 - 111 mmol/L 104 104 103  CO2 22 - 32 mmol/L 22 24 -  Calcium 8.9 - 10.3 mg/dL 8.3(L) 8.8(L) -  Total Protein 6.5 - 8.1 g/dL - - -  Total Bilirubin 0.3 - 1.2 mg/dL - - -  Alkaline Phos 38 - 126 U/L - - -  AST 15 - 41 U/L - - -  ALT 0 - 44 U/L - - -    Imaging: DG C-Arm 1-60 Min  Result Date: 06/13/2020 CLINICAL  DATA:  ORIF left femur fracture EXAM: LEFT FEMUR 2 VIEWS; DG C-ARM 1-60 MIN COMPARISON:  06/12/2020 FINDINGS: Intertrochanteric fracture on the left has been fixed with a compression screw and locking intramedullary rod extending to the distal femur. Fracture in satisfactory alignment. IMPRESSION: Satisfactory ORIF left intertrochanteric fracture. Electronically Signed   By: Franchot Gallo M.D.   On: 06/13/2020 17:23   DG FEMUR MIN 2 VIEWS LEFT  Result Date: 06/13/2020 CLINICAL DATA:  ORIF left femur fracture EXAM: LEFT FEMUR 2 VIEWS; DG C-ARM 1-60 MIN COMPARISON:  06/12/2020 FINDINGS: Intertrochanteric fracture on the left has been fixed with a compression screw and locking intramedullary rod extending to the distal femur. Fracture in satisfactory alignment. IMPRESSION: Satisfactory ORIF left intertrochanteric fracture. Electronically Signed   By: Franchot Gallo  M.D.   On: 06/13/2020 17:23    Assessment/Plan:   Principal Problem:   Closed comminuted intertrochanteric fracture of left femur (HCC) Active Problems:   Essential hypertension   CKD (chronic kidney disease) stage 3, GFR 30-59 ml/min (HCC)   Type 2 diabetes mellitus with hyperglycemia ([place])   Patient Summary: [name] a 86 y.o.year old femalewith hx of A fib on xarelto, HFpEF, DM, HTN, HLD, COPD, CKD 3, GERD, depression, anxiety, legally blind in [place] eyepresenting for fall with L hip pain.Xray with comminuted intratrochanteric left femoral fracture, ORIF repair on 06/13/20  Comminuted Intertrochanteric Fracture of The Proximal Left Femur Mechanical fall Repair with ORIF 06/13/20 Post op day 1 for ORIF, patient doing well. Procedure went well without complications. Patient in mild pain, but states well controlled with pain medication regimen. Patient's daughter will consider SNF if recommended by PT. -vitamin D 25 hydroxy - 16.13, will supplement with vitamin D3 tab 1000 daily and continue at discharge -pending PT consult -pain control with acetaminophen 650 mg q6h prn mild pain, norco 1-2 tab q4h PRN for moderate pain -miralax prn constipation  Chronic A fib without RVR Currently in afib. Rate is currently controlled, but known rate agent at home. -Restarted xarelto post operatively   Chronic HFpEF Most recent echo from11/20/2018with EF 65-70%, unable to assess diastolic function due to A fib. -continue home lasix   DMwith peripheral neuropathy Last A1c in 2018 in 8.0. Morning glucose over 200 -add nightly lantus 15 units -continue SSI -f/u A1c -continue home gabapentin 100 mg  HTN On amlodipine 5mg  daily at home. BP controlled on admission. -continue home meds  CKD 3b Creatinine 1.50, approximately baseline -daily BMP -avoid nephrotoxins  HLD On pravastatin 40mg  daily at home. -resume by time of discharge  Diet: Carb-Modified IVF:  None,None VTE: NOAC Code: Full PT/OT recs: SNF for Subacute PT,   Dispo: Anticipated discharge to Skilled nursing facility in 1-2 days pending post operative care.   Sanjuana Letters DO Internal Medicine Resident PGY-1 Pager [phone] Please contact the on call pager after 5 pm and on weekends at [phone].

## 2020-06-14 NOTE — Progress Notes (Signed)
     Subjective: 1 Day Post-Op Procedure(s) (LRB): INTRAMEDULLARY (IM) NAIL INTERTROCHANTRIC (Left)   Patient reports pain as mild.  Feels much better in the left leg than prior to surgery.  No reported events throughout the night.      Objective:   VITALS:   Vitals:   06/14/20 0500 06/14/20 0827  BP: 100/69 101/66  Pulse: 99 (!) 103  Resp: 18 16  Temp: 98.6 F (37 C) 98.7 F (37.1 C)  SpO2: 97% 96%    Dorsiflexion/Plantar flexion intact Incision: dressing C/D/I No cellulitis present Compartment soft  LABS Recent Labs    06/12/20 2112 06/12/20 2112 06/12/20 2138 06/13/20 0414 06/14/20 0308  HGB 12.6   < > 13.3 12.2 10.4*  HCT 39.8   < > 39.0 38.2 32.2*  WBC 6.7  --   --  12.5* 12.2*  PLT 258  --   --  239 224   < > = values in this interval not displayed.    Recent Labs    06/12/20 2138 06/13/20 0414 06/14/20 0308  NA 138 137 136  K 4.2 4.4 4.5  BUN 27* 23 24*  CREATININE 1.40* 1.33* 1.50*  GLUCOSE 255* 256* 238*     Assessment/Plan: 1 Day Post-Op Procedure(s) (LRB): INTRAMEDULLARY (IM) NAIL INTERTROCHANTRIC (Left)   Up with therapy Discharge disposition TBD 50% WB left leg        Danae Orleans PA-C  [facility]  [place] [address], [address], [place], [place] [address] Phone: [phone] [email or web address] Facebook  [employer]

## 2020-06-14 NOTE — Evaluation (Signed)
Physical Therapy Evaluation Patient Details Name: [name] MRN: [id number] DOB: 03-22-1929 Today's Date: 06/14/2020   History of Present Illness  Pt is an 86 y.o. female admitted 06/12/20 after fall sustaining L intertrochanteric hip fx; pt reports hitting head on furniture, but denies LOC. S/p L hip ORIF 10/18. PMH includes glaucoma, DM, COPD, CKD, HTN, legally blind R eye, DM2, panic attacks, depression, anxiety.    Clinical Impression  Pt presents with an overall decrease in functional mobility secondary to above. PTA, pt lives with son, limited household ambulation with RW (w/c for community distances); daughter lives next door and hopes to have pt return to her home with family assist. Educ on precautions, positioning, therex, and importance of mobility. Today, pt required maxA+2 to totalA to sit EOB; unable to stand despite assist+2; limited by significant weakness, LLE pain and impaired activity tolerance. Prolonged discussing with daughter regarding d/c recommendations; daughter hopeful to have pt return home with 24/7 assist from her and other family members; unsure this is safe option. Recommend SNF-level therapies to maximize functional mobility and independence prior to return home. If to return home, will need 24/7 +2 assist and [facility] services.     Follow Up Recommendations SNF;Supervision/Assistance - 24 hour (family may decline)    Equipment Recommendations   (TBD)    Recommendations for Other Services       Precautions / Restrictions Precautions Precautions: Fall Restrictions Weight Bearing Restrictions: Yes LLE Weight Bearing: Partial weight bearing LLE Partial Weight Bearing Percentage or Pounds: 50% PWB      Mobility  Bed Mobility Overal bed mobility: Needs Assistance Bed Mobility: Supine to Sit;Sit to Supine     Supine to sit: Max assist;+2 for physical assistance;HOB elevated Sit to supine: Total assist   General bed mobility comments: Pt with significant  pain with any LLE movement, this improved with pt doing as much by herself as possible, significant increased time and effort, maxA+2 to manage BLEs, scoot hips and assist trunk elevation; totalA return to supine  Transfers Overall transfer level: Needs assistance Equipment used: Rolling walker (2 wheeled)             General transfer comment: Attempted standing from EOB with RW and maxA+2, pt unable to offload buttocks from bed, yelling due to LLE pain with attempt to WB; limited by pain and significant weakness; further attempts deferred secondary to SOB, pain and fatigue  Ambulation/Gait             General Gait Details: Unable  Stairs            Wheelchair Mobility    Modified Rankin (Stroke Patients Only)       Balance Overall balance assessment: Needs assistance   Sitting balance-Leahy Scale: Fair Sitting balance - Comments: Prolonged sitting EOB with supervision for safety; bouts of SOB requiring cues for deep breathing                                     Pertinent Vitals/Pain Pain Assessment: Faces Faces Pain Scale: Hurts whole lot Pain Location: LLE with mobility Pain Descriptors / Indicators: Grimacing;Guarding;Operative site guarding;Moaning Pain Intervention(s): Limited activity within patient's tolerance;Repositioned;Patient requesting pain meds-RN notified    Home Living Family/patient expects to be discharged to:: Private residence Living Arrangements: Children Available Help at Discharge: Family;Available 24 hours/day Type of Home: House Home Access: Ramped entrance     Home Layout: One  level Home Equipment: Walker - 2 wheels;Walker - standard;Bedside commode;Other (comment);Toilet riser;Wheelchair - manual (hoyer lift) Additional Comments: Pt currently living with son, daughter lives next door. Daughter likely plans to have pt d/c to her home with 24/7 assist from pt's daughter and great-niece    Prior Function Level of  Independence: Needs assistance   Gait / Transfers Assistance Needed: Household ambulation with RW; w/c for community distances requiring assist to push  ADL's / Homemaking Assistance Needed: Limited by poor vision (h/o glaucoma), family provides majority of meals. Daughter with concerns regarding safety due to pt's poor vision and cognitive impariment        Hand Dominance        Extremity/Trunk Assessment   Upper Extremity Assessment Upper Extremity Assessment: Generalized weakness (H/o L rotator cuff issues, painful)    Lower Extremity Assessment Lower Extremity Assessment: Generalized weakness;LLE deficits/detail LLE Deficits / Details: s/p L femur ORIF; functionally <3/5 throughout, limited by significant post-op pain and weakness LLE: Unable to fully assess due to pain LLE Coordination: decreased gross motor;decreased fine motor    Cervical / Trunk Assessment Cervical / Trunk Assessment: Kyphotic  Communication      Cognition Arousal/Alertness: Awake/alert Behavior During Therapy: WFL for tasks assessed/performed;Anxious Overall Cognitive Status: History of cognitive impairments - at baseline Area of Impairment: Attention;Memory;Following commands;Safety/judgement;Awareness;Problem solving                   Current Attention Level: Sustained Memory: Decreased recall of precautions;Decreased short-term memory Following Commands: Follows one step commands with increased time;Follows multi-step commands inconsistently Safety/Judgement: Decreased awareness of safety;Decreased awareness of deficits Awareness: Emergent Problem Solving: Slow processing;Decreased initiation;Difficulty sequencing;Requires verbal cues General Comments: Daughter with safety concerns regarding pt (sounds like forgetful, poor insight into deficits at baseline). Pt distracted by pain, requiring frequent cues to stay on task. Anxious regarding SOB and breath, cues for deep breathing       General Comments General comments (skin integrity, edema, etc.): DOE up to 3/4 with activity, SpO2 99% on RA, HR 100s-110s    Exercises Other Exercises Other Exercises: RLE AAROM hip flexion and SLR; pt with very poor tolerance to LLE PROM/AAROM, tolerating minimal ankle ROM, knee flex/ext or hip flex   Assessment/Plan    PT Assessment Patient needs continued PT services  PT Problem List Decreased strength;Decreased range of motion;Decreased activity tolerance;Decreased balance;Decreased mobility;Decreased cognition;Decreased knowledge of use of DME;Decreased knowledge of precautions;Cardiopulmonary status limiting activity;Pain;Obesity       PT Treatment Interventions DME instruction;Gait training;Stair training;Functional mobility training;Therapeutic activities;Therapeutic exercise;Balance training;Cognitive remediation;Patient/family education;Wheelchair mobility training    PT Goals (Current goals can be found in the Care Plan section)  Acute Rehab PT Goals Patient Stated Goal: Unsure about rehab at SNF, daughter and pt hopeful for return home with family assist PT Goal Formulation: With patient/family Time For Goal Achievement: 06/28/20 Potential to Achieve Goals: Fair    Frequency Min 3X/week   Barriers to discharge        Co-evaluation               AM-PAC PT "6 Clicks" Mobility  Outcome Measure Help needed turning from your back to your side while in a flat bed without using bedrails?: Total Help needed moving from lying on your back to sitting on the side of a flat bed without using bedrails?: Total Help needed moving to and from a bed to a chair (including a wheelchair)?: Total Help needed standing up from a chair using your arms (  e.g., wheelchair or bedside chair)?: Total Help needed to walk in hospital room?: Total Help needed climbing 3-5 steps with a railing? : Total 6 Click Score: 6    End of Session Equipment Utilized During Treatment: Gait  belt Activity Tolerance: Patient limited by pain Patient left: in bed;with call bell/phone within reach;with bed alarm set;with family/visitor present Nurse Communication: Mobility status;Need for lift equipment PT Visit Diagnosis: Other abnormalities of gait and mobility (R26.89);Muscle weakness (generalized) (M62.81);Pain Pain - Right/Left: Left Pain - part of body: Hip;Leg    Time: 0037-0488 PT Time Calculation (min) (ACUTE ONLY): 39 min   Charges:   PT Evaluation $PT Eval Moderate Complexity: 1 Mod PT Treatments $Therapeutic Activity: 8-22 mins $Self Care/Home Management: 8-22       Mabeline Caras, PT, DPT Acute Rehabilitation Services  Pager [phone] Office [phone]  Derry Lory 06/14/2020, 1:20 PM

## 2020-06-15 DIAGNOSIS — M7752 Other enthesopathy of left foot: Secondary | ICD-10-CM

## 2020-06-15 LAB — BASIC METABOLIC PANEL
Anion gap: 8 (ref 5–15)
BUN: 33 mg/dL — ABNORMAL HIGH (ref 8–23)
CO2: 24 mmol/L (ref 22–32)
Calcium: 7.9 mg/dL — ABNORMAL LOW (ref 8.9–10.3)
Chloride: 103 mmol/L (ref 98–111)
Creatinine, Ser: 1.92 mg/dL — ABNORMAL HIGH (ref 0.44–1.00)
GFR, Estimated: 23 mL/min — ABNORMAL LOW (ref >60)
Glucose, Bld: 109 mg/dL — ABNORMAL HIGH (ref 70–99)
Potassium: 3.9 mmol/L (ref 3.5–5.1)
Sodium: 135 mmol/L (ref 135–145)

## 2020-06-15 LAB — GLUCOSE, CAPILLARY
Glucose-Capillary: 82 mg/dL (ref 70–99)
Glucose-Capillary: 173 mg/dL — ABNORMAL HIGH (ref 70–99)
Glucose-Capillary: 170 mg/dL — ABNORMAL HIGH (ref 70–99)
Glucose-Capillary: 167 mg/dL — ABNORMAL HIGH (ref 70–99)

## 2020-06-15 LAB — HEMOGLOBIN A1C
Hgb A1c MFr Bld: 7.4 % — ABNORMAL HIGH (ref 4.8–5.6)
Mean Plasma Glucose: 166 mg/dL

## 2020-06-15 LAB — CBC
HCT: 30 % — ABNORMAL LOW (ref 36.0–46.0)
Hemoglobin: 9.8 g/dL — ABNORMAL LOW (ref 12.0–15.0)
MCH: 30.6 pg (ref 26.0–34.0)
MCHC: 32.7 g/dL (ref 30.0–36.0)
MCV: 93.8 fL (ref 80.0–100.0)
Platelets: 213 10*3/uL (ref 150–400)
RBC: 3.2 MIL/uL — ABNORMAL LOW (ref 3.87–5.11)
RDW: 14.2 % (ref 11.5–15.5)
WBC: 10.1 10*3/uL (ref 4.0–10.5)
nRBC: 0.2 % (ref 0.0–0.2)

## 2020-06-15 MED ORDER — LACTATED RINGERS IV SOLN: INTRAVENOUS | Status: AC

## 2020-06-15 MED ORDER — HYDROCODONE-ACETAMINOPHEN 5-325 MG PO TABS
1.0000 | ORAL_TABLET | Freq: Four times a day (QID) | ORAL | 0 refills | Status: DC | PRN
Start: 2020-06-15 — End: 2020-06-16

## 2020-06-15 NOTE — TOC CAGE-AID Note (Signed)
Transition of Care [facility]) - CAGE-AID Screening   Patient Details  Name: [name] MRN: [id number] Date of Birth: 07/03/1929  Transition of Care [facility] CM/SW Contact:    Emeterio Reeve, [place] Phone Number: 06/15/2020, 12:22 PM   Clinical Narrative:  CSW met with pt at bedside. CSW introduced self and explained role at the hospital.  Pt denies alcohol use. Pt denies substance use. Pt did not need any resources at this time.    CAGE-AID Screening:    Have You Ever Felt You Ought to Cut Down on Your Drinking or Drug Use?: No Have People Annoyed You By Critizing Your Drinking Or Drug Use?: No Have You Felt Bad Or Guilty About Your Drinking Or Drug Use?: No Have You Ever Had a Drink or Used Drugs First Thing In The Morning to Steady Your Nerves or to Get Rid of a Hangover?: No CAGE-AID Score: 0  Substance Abuse Education Offered: Yes    [name], [place] Social Worker [phone]

## 2020-06-15 NOTE — Progress Notes (Signed)
     Subjective: 2 Days Post-Op Procedure(s) (LRB): INTRAMEDULLARY (IM) NAIL INTERTROCHANTRIC (Left)   Patient reports pain as moderate, calling nurse for medication.  No reported events throughout the night.  Discussed the procedure and expectations moving forward to patient and family memeber.  Orthopaedically stable  Follow up in the clinic in 2 weeks.  Knows to call with any questions or concerns.       Objective:   VITALS:   Vitals:   06/14/20 1456 06/14/20 2300  BP: 105/60 110/70  Pulse: 94 91  Resp: 18 18  Temp: 97.6 F (36.4 C) 98.1 F (36.7 C)  SpO2: 98% 98%    Dorsiflexion/Plantar flexion intact Incision: dressing C/D/I No cellulitis present Compartment soft  LABS Recent Labs    06/13/20 0414 06/14/20 0308 06/15/20 0714  HGB 12.2 10.4* 9.8*  HCT 38.2 32.2* 30.0*  WBC 12.5* 12.2* 10.1  PLT 239 224 213    Recent Labs    06/13/20 0414 06/14/20 0308 06/15/20 0217  NA 137 136 135  K 4.4 4.5 3.9  BUN 23 24* 33*  CREATININE 1.33* 1.50* 1.92*  GLUCOSE 256* 238* 109*     Assessment/Plan: 2 Days Post-Op Procedure(s) (LRB): INTRAMEDULLARY (IM) NAIL INTERTROCHANTRIC (Left)   Up with therapy Discharge disposition TBD    Ortho recommendations:  Xarelto for DVT prophylaxis as well as she was on pre-op for a-fib, unless other medically indicated.  Norco for pain management (Rx written).  MiraLax and Colace for constipation  Iron 325 mg tid for 2-3 weeks   PWB 50% on the left leg.  Dressing to remain in place until follow in clinic in 2 weeks.  Dressing is waterproof and may shower with it in place.  Follow up in 2 weeks at [facility]. Follow up with OLIN,Loyed Wilmes D in 2 weeks.  Contact information:  [facility] [address] [place] [phone]         Danae Orleans PA-C  [facility]  [place] [address] [place], [place], [place] [address] Phone:  [phone] [email or web address] Facebook  [employer]

## 2020-06-15 NOTE — Evaluation (Signed)
Occupational Therapy Evaluation Patient Details Name: [name] MRN: [id number] DOB: 02-13-1929 Today's Date: 06/15/2020    History of Present Illness Pt is an 86 y.o. female admitted 06/12/20 after fall sustaining L intertrochanteric hip fx; pt reports hitting head on furniture, but denies LOC. S/p L hip ORIF 10/18. PMH includes glaucoma, DM, COPD, CKD, HTN, legally blind R eye, DM2, panic attacks, depression, anxiety.   Clinical Impression   PTA, pt lives with family and received light assist for ADLs and assistance with IADLs as needed. Pt typically uses a walker for mobility. Pt presents now s/p fall, fx, and surgery with deficits in pain, balance, endurance, and strength. Pt's granddaughter present and involved in session. Due to deficits, pt requires Max A x 2 for bed mobility and lateral scoot/squat standing attempts toward HOB. While sitting EOB, pt with 2 sudden onset of sharp pain with physical assist needed to correct balance. Pt with increased anxiety and fear of pain, so increased time needed for all tasks. Pt requires Mod A for UB ADLs and Total A for LB ADLs due to deficits. Recommend SNF for short term rehab prior to return home. However, noted that family plans to take pt home and assist her there. If pt to go home, will need max HH services and +2 assist. Plan to assess tolerance to OOB activities via maximove during next session.     Follow Up Recommendations  SNF;Supervision/Assistance - 24 hour (HHOT/PT, 24/7 + 2 physical assist if refuses SNF)    Equipment Recommendations  Hospital bed    Recommendations for Other Services       Precautions / Restrictions Precautions Precautions: Fall;Other (comment) Precaution Comments: Legally blind Restrictions Weight Bearing Restrictions: Yes LLE Weight Bearing: Partial weight bearing LLE Partial Weight Bearing Percentage or Pounds: 50% PWB      Mobility Bed Mobility Overal bed mobility: Needs Assistance Bed Mobility:  Supine to Sit;Sit to Supine     Supine to sit: Max assist;+2 for physical assistance;HOB elevated Sit to supine: Total assist;+2 for physical assistance   General bed mobility comments: Pt with significant pain with any LLE movement, significant increased time and effort, maxA+2 to manage BLEs, scoot hips and assist trunk elevation; totalA return to supine and repositioning    Transfers Overall transfer level: Needs assistance Equipment used: None Transfers: Lateral/Scoot Transfers           General transfer comment: Pt able to minimally offload buttocks from EOB with BUE support and maxA+2 with bed pad, unable to initiate standing beyond this due to significant LLE pain; perform 2x bouts of lateral scooting towards HOB with BUE support and maxA+2 with bed pad    Balance Overall balance assessment: Needs assistance   Sitting balance-Leahy Scale: Fair Sitting balance - Comments: Prolonged sitting EOB with supervision for safety; 2x bouts of posterior LOB secondary to sharp LLE pain onset requiring assist to correct                                   ADL either performed or assessed with clinical judgement   ADL Overall ADL's : Needs assistance/impaired Eating/Feeding: Sitting;Set up   Grooming: Sitting;Brushing hair;Minimal assistance Grooming Details (indicate cue type and reason): Pt min a to brush hair. Granddaughter present and assisted with brushing back of head. min guard for sitting balance Upper Body Bathing: Moderate assistance;Sitting   Lower Body Bathing: Maximal assistance;Bed level  Upper Body Dressing : Moderate assistance;Sitting   Lower Body Dressing: Total assistance;Bed level Lower Body Dressing Details (indicate cue type and reason): Total A for socks bed level     Toileting- Clothing Manipulation and Hygiene: Total assistance;Bed level         General ADL Comments: Pt with pain and weakness requiring extensive assist for ADLs      Vision Baseline Vision/History: Legally blind;Glaucoma Patient Visual Report: No change from baseline Vision Assessment?: Vision impaired- to be further tested in functional context Additional Comments: Pt reports hx of visual impairment, able to see general shapes and colors      Perception     Praxis      Pertinent Vitals/Pain Pain Assessment: Faces Faces Pain Scale: Hurts worst Pain Location: LLE with mobility Pain Descriptors / Indicators: Grimacing;Guarding;Operative site guarding;Moaning Pain Intervention(s): Limited activity within patient's tolerance;Monitored during session;Repositioned     Hand Dominance Right   Extremity/Trunk Assessment Upper Extremity Assessment Upper Extremity Assessment: RUE deficits/detail;LUE deficits/detail RUE Deficits / Details: hx of injury, active shld flex to 80*, PROM to 110* RUE Sensation: WNL LUE Deficits / Details: hx of rotator cuff injury, active shld flex to 65*, PROM to 85*   Lower Extremity Assessment Lower Extremity Assessment: Defer to PT evaluation   Cervical / Trunk Assessment Cervical / Trunk Assessment: Kyphotic   Communication     Cognition Arousal/Alertness: Awake/alert Behavior During Therapy: WFL for tasks assessed/performed;Anxious Overall Cognitive Status: History of cognitive impairments - at baseline Area of Impairment: Attention;Memory;Following commands;Safety/judgement;Awareness;Problem solving                   Current Attention Level: Sustained Memory: Decreased recall of precautions;Decreased short-term memory Following Commands: Follows one step commands with increased time;Follows multi-step commands inconsistently Safety/Judgement: Decreased awareness of safety;Decreased awareness of deficits Awareness: Emergent Problem Solving: Slow processing;Decreased initiation;Difficulty sequencing;Requires verbal cues General Comments: Daughter with safety concerns regarding pt (sounds like  forgetful, poor insight into deficits at baseline). Pt distracted by pain, requiring frequent cues to stay on task. Anxious regarding pain, but still pleasant and willing to participate with encouragement   General Comments  Granddaughter present and assisting during session. Reports family will be able to provide +2 physical assist at home    Exercises Other Exercises Other Exercises: Supine LLE PROM/AAROM prior to initiating bed mobility - including heel slides, abd/add, SAQ (over therapist's arm) -- significant pain with limited motion, slight improvement with multiple repetitions   Shoulder Instructions      Home Living Family/patient expects to be discharged to:: Private residence Living Arrangements: Children Available Help at Discharge: Family;Available 24 hours/day Type of Home: House Home Access: Ramped entrance     Home Layout: One level     Bathroom Shower/Tub: Occupational psychologist: Handicapped height     Home Equipment: Environmental consultant - 2 wheels;Walker - standard;Bedside commode;Other (comment);Toilet riser;Wheelchair - manual (hoyer lift)   Additional Comments: Pt currently living with son, daughter lives next door. Daughter likely plans to have pt d/c to her home with 24/7 assist from pt's daughter and great-niece      Prior Functioning/Environment Level of Independence: Needs assistance  Gait / Transfers Assistance Needed: Household ambulation with RW; w/c for community distances requiring assist to push ADL's / Homemaking Assistance Needed: Limited by poor vision (h/o glaucoma), family provides majority of meals. Daughter with concerns regarding safety due to pt's poor vision and cognitive impariment  OT Problem List: Decreased strength;Decreased activity tolerance;Impaired balance (sitting and/or standing);Decreased cognition;Decreased safety awareness;Impaired vision/perception;Decreased knowledge of precautions;Decreased knowledge of use of  DME or AE;Pain      OT Treatment/Interventions:      OT Goals(Current goals can be found in the care plan section) Acute Rehab OT Goals Patient Stated Goal: go home with therapy OT Goal Formulation: With patient/family Time For Goal Achievement: 06/29/20 Potential to Achieve Goals: Good ADL Goals Pt Will Perform Upper Body Bathing: with supervision;sitting Pt Will Perform Lower Body Bathing: with mod assist;bed level;sitting/lateral leans Pt Will Perform Toileting - Clothing Manipulation and hygiene: with max assist;sitting/lateral leans;with caregiver independent in assisting Additional ADL Goal #1: Pt to demonstrate ability to sit EOB >5 min with no more than Supervision during ADLs Additional ADL Goal #2: Pt to demonstrate sit to stand transfer with no more than Mod A x 2 in preparation for ADL transfers  OT Frequency: Min 2X/week   Barriers to D/C:            Co-evaluation PT/OT/SLP Co-Evaluation/Treatment: Yes Reason for Co-Treatment: Complexity of the patient's impairments (multi-system involvement);For patient/therapist safety;To address functional/ADL transfers PT goals addressed during session: Mobility/safety with mobility;Balance OT goals addressed during session: ADL's and self-care      AM-PAC OT "6 Clicks" Daily Activity     Outcome Measure Help from another person eating meals?: A Little Help from another person taking care of personal grooming?: A Little Help from another person toileting, which includes using toliet, bedpan, or urinal?: Total Help from another person bathing (including washing, rinsing, drying)?: A Lot Help from another person to put on and taking off regular upper body clothing?: A Lot Help from another person to put on and taking off regular lower body clothing?: Total 6 Click Score: 12   End of Session Nurse Communication: Mobility status  Activity Tolerance: Patient limited by pain Patient left: in bed;with call bell/phone within  reach;with bed alarm set;with family/visitor present  OT Visit Diagnosis: Unsteadiness on feet (R26.81);Other abnormalities of gait and mobility (R26.89);Muscle weakness (generalized) (M62.81);History of falling (Z91.81);Pain Pain - Right/Left: Left Pain - part of body: Hip;Leg                Time: 3888-7579 OT Time Calculation (min): 33 min Charges:  OT General Charges $OT Visit: 1 Visit OT Evaluation $OT Eval Moderate Complexity: 1 Mod  Layla Maw, OTR/L  Layla Maw 06/15/2020, 12:59 PM

## 2020-06-15 NOTE — Progress Notes (Addendum)
HD#3 Subjective:  Overnight Events: Overnight reported pain  Granddaughter at bedside. Patient notes she has left groin pain when she coughs, this occurred during her prior hip surgery as well. Reports soreness around her IV sites. Discussed physical therapy's recommendation for subacute rehab. Patient and her granddaughter state they would like the patient to return home. Plans to return to daughter's one-story home. Ramp. Numerous family members around. Patient be comes tearful when discussing her son's illness. "I'm torn in two parts." Thinks patient would do better at home evendespite having potentially less frequent PT. Patient also endorses  cold finger tips, chronic issue for the patient. reassurance provided regarding -related changes to skin.   Pain over bony prominence of left foot. High arch. Patient states she has worn a boot previously to assist with walking.   1300 Discussed plan with daughter who states family would like patient to be discharged home and will have 24 hour care by family. Discussed risk and benefits of SNF facility vs home, patient's daughter [name] prefers patient be home. Patient's daughter asked about transportation as patient is not mobile at this time, discussed with her that will reach out to social work about transportation.  Objective:  Vital signs in last 24 hours: Vitals:   06/14/20 0827 06/14/20 0930 06/14/20 1456 06/14/20 2300  BP: 101/66  105/60 110/70  Pulse: (!) 103 93 94 91  Resp: 16  18 18   Temp: 98.7 F (37.1 C)  97.6 F (36.4 C) 98.1 F (36.7 C)  TempSrc: Oral  Oral Oral  SpO2: 96%  98% 98%  Weight:      Height:       Supplemental O2: Room Air  Physical Exam:  Physical Exam Vitals and nursing note reviewed.  Constitutional:      Comments: Patient in minimal pain, comfortable when not moving leg.   Cardiovascular:     Rate and Rhythm: Normal rate and regular rhythm.     Pulses: Normal pulses.     Heart sounds: Normal heart  sounds. No murmur heard.   Pulmonary:     Effort: Pulmonary effort is normal. No respiratory distress.  Musculoskeletal:     Comments: Left upper extremity with bandage in place  Bony prominence dorsal aspect of foot, non tender to palpate. Patient with high arch of left foot.   Neurological:     General: No focal deficit present.     Mental Status: She is oriented to person, place, and time. Mental status is at baseline.  Psychiatric:        Mood and Affect: Mood normal.        Behavior: Behavior normal.    Filed Weights   06/12/20 2120  Weight: 85.7 kg     Intake/Output Summary (Last 24 hours) at 06/15/2020 0608 Last data filed at 06/15/2020 0100 Gross per 24 hour  Intake 240 ml  Output --  Net 240 ml   Net IO Since Admission: 1,938.23 mL [06/15/20 0608]  Pertinent Labs: CBC Latest Ref Rng & Units 06/14/2020 06/13/2020 06/12/2020  WBC 4.0 - 10.5 K/uL 12.2(H) 12.5(H) -  Hemoglobin 12.0 - 15.0 g/dL 10.4(L) 12.2 13.3  Hematocrit 36 - 46 % 32.2(L) 38.2 39.0  Platelets 150 - 400 K/uL 224 239 -    CMP Latest Ref Rng & Units 06/15/2020 06/14/2020 06/13/2020  Glucose 70 - 99 mg/dL 109(H) 238(H) 256(H)  BUN 8 - 23 mg/dL 33(H) 24(H) 23  Creatinine 0.44 - 1.00 mg/dL 1.92(H) 1.50(H) 1.33(H)  Sodium  135 - 145 mmol/L 135 136 137  Potassium 3.5 - 5.1 mmol/L 3.9 4.5 4.4  Chloride 98 - 111 mmol/L 103 104 104  CO2 22 - 32 mmol/L 24 22 24   Calcium 8.9 - 10.3 mg/dL 7.9(L) 8.3(L) 8.8(L)  Total Protein 6.5 - 8.1 g/dL - - -  Total Bilirubin 0.3 - 1.2 mg/dL - - -  Alkaline Phos 38 - 126 U/L - - -  AST 15 - 41 U/L - - -  ALT 0 - 44 U/L - - -    Imaging: No results found.  Assessment/Plan:   Principal Problem:   Closed comminuted intertrochanteric fracture of left femur (HCC) Active Problems:   Essential hypertension   CKD (chronic kidney disease) stage 3, GFR 30-59 ml/min (HCC)   Type 2 diabetes mellitus with hyperglycemia (HCC)   Vitamin D deficiency  Patient  Summary: [name] a 86 y.o.year old femalewith hx of A fib on xarelto, HFpEF, DM, HTN, HLD, COPD, CKD 3, GERD, depression, anxiety, legally blind in [place] eyepresenting for fall with L hip pain.Xray with comminuted intratrochanteric left femoral fracture, ORIF repair on 06/13/20  Comminuted Intertrochanteric Fracture of The Proximal Left Femur Mechanical fall Repair with ORIF 06/13/20 Post op day 2 for ORIF, patient doing well but continues to complain of pain. Procedure went well without complications. Patient in mild pain, but states well controlled with pain medication regimen. Patient and family refusing SNF placement despite PT/OT recommendations. Discussed risk and benefits of patient being discharged home, family addiment about patient performing rehabilitation at home.  -patient home tomorrow with home health/pt/ot -discuss discharge with social work -pain control withacetaminophen 650 mg q6h prn mild pain, norco 1-2 tab q4h PRN for moderate pain -miralax prn constipation --vitamin D 25 hydroxy - 16.13, followed by nephrology on outpatient basis for low vitamin D levels.   Chronic Left Foot Bone Spur Patient with left foot bone spur that she states was painful to touch today. Do not suspect patient has left foot injury during fall.  -follow up with PCP  Chronic A fib without RVR Currently in afib.Rate is currently controlled, but known rate agent at home. -Restarted xarelto post operatively   COVID-19 Vaccination Patient would like COVID vaccine, however, believe patient should wait as may be difficult to distinguish between post op complications and post vaccine side effects. Patient also declined to receive while hospitalized.  Chronic HFpEF Most recent echo from11/20/2018with EF 65-70%, unable to assess diastolic function due to A fib. -holding at this time due to increase in creatinine  DMwith peripheral neuropathy Last A1c in 2018 in 8.0. Morning glucose  improved  -continue nightly lantus 15 units -continue SSI -continue home gabapentin 100 mg  HTN On amlodipine 5mg  daily at home. BP controlled on admission. -continue home meds  CKD 3b Creatinine 1.92, 1.44 on admission. Suspect worsening due to decrease in fluid intake -holding lasix -LR 75cc infusion for 10 hours -daily BMP -avoid nephrotoxins  HLD On pravastatin 40mg  daily at home. -resume by time of discharge  Diet: Carb-Modified IVF: None,None VTE: NOAC Code: Full PT/OT recs: SNF for Subacute PT   Dispo: Anticipated discharge to [facility] Resident PGY-1 Pager [phone] Please contact the on call pager after 5 pm and on weekends at [phone].

## 2020-06-15 NOTE — TOC Initial Note (Addendum)
Transition of Care [facility]) - Initial/Assessment Note    Patient Details  Name: [name] MRN: [id number] Date of Birth: 09/28/1928  Transition of [facility]) CM/SW Contact:    Emeterio Reeve, [place] Phone Number: 06/15/2020, 1:54 PM  Clinical Narrative:                  CSW met with pt at bedside. CSW introduced self and explained her role at the hospital.  Pt states she lives at home with her sons. Pt states she uses a walker and is mostly independent with  ADL's. Pt reports she has not had the covid vaccine.  CSW reviewed pt/ot reccs of SNF. Pt instructed csw to call daughter [name] to discuss discharge planning. Pt stated she will do what [name] feels is best.  CSW spoke with [name] [phone] via phone. [name] stated that pt will go home to her house. CSW explained that pt will need 24/7 supervision and is a 2+ person assist to move. [name] stated that she will be there 24/7 and her daughter is also their most of the time. [name] stated that her brothers are down the street and can help when needed. [name] reports she took car of her husband and knows what's expected.  [name] states she already has a hospital bed, walker, wheelchair, raised toilet, potty chair and hoyer lift. [name] believes the only DME she needs is a shower chair, lift belt and a recliner. [name] request [facility] if possible. Pt will be going to [facility] home, the address is [place], [place] contact number [phone].   [name] request [facility] to take pt home when discharged.  Expected Discharge Plan: [place] Barriers to Discharge: Continued Medical Work up   Patient Goals and CMS Choice Patient states their goals for this hospitalization and ongoing recovery are:: to return home CMS Medicare.gov Compare Post Acute Care list provided to:: Patient Choice offered to / list presented to : Patient  Expected Discharge Plan and Services Expected Discharge Plan: [place]        Living arrangements for the past 2 months: Single Family Home                                      Prior Living Arrangements/Services Living arrangements for the past 2 months: Single Family Home Lives with:: Adult Children Patient language and need for interpreter reviewed:: Yes        Need for Family Participation in Patient Care: Yes (Comment) Care giver support system in place?: Yes (comment) Current home services: DME Criminal Activity/Legal Involvement Pertinent to Current Situation/Hospitalization: No - Comment as needed  Activities of Daily Living Home Assistive Devices/Equipment: None ADL Screening (condition at time of admission) Patient's cognitive ability adequate to safely complete daily activities?: Yes Is the patient deaf or have difficulty hearing?: No Does the patient have difficulty seeing, even when wearing glasses/contacts?: Yes Does the patient have difficulty concentrating, remembering, or making decisions?: No Patient able to express need for assistance with ADLs?: Yes Does the patient have difficulty dressing or bathing?: No Independently performs ADLs?: Yes (appropriate for developmental age) Does the patient have difficulty walking or climbing stairs?: Yes Weakness of Legs: Both Weakness of Arms/Hands: None  Permission Sought/Granted Permission sought to share information with : Family Supports Permission granted to share information with : Yes, Verbal Permission Granted  Share Information with NAME: [name]  Permission granted to share info w AGENCY: [facility]  Permission granted to share info w Relationship: daughter  Permission granted to share info w Contact Information: [phone]  Emotional Assessment Appearance:: Appears stated age Attitude/Demeanor/Rapport: Engaged Affect (typically observed): Appropriate Orientation: : Oriented to Self, Oriented to Place, Oriented to  Time, Oriented to Situation Alcohol / Substance Use: Not  Applicable Psych Involvement: No (comment)  Admission diagnosis:  Low back pain [M54.50] Pain [R52] Closed intertrochanteric fracture of hip, left, initial encounter ([place]) [S72.142A] Closed comminuted fracture of left hip ([place]) [S72.092A] Patient Active Problem List   Diagnosis Date Noted  . Vitamin D deficiency 06/14/2020  . Closed comminuted intertrochanteric fracture of left femur ([place]) 06/12/2020  . Osteoarthritis of finger of left hand 11/06/2017  . Choledocholithiasis   . Common bile duct dilatation 07/14/2017  . Essential hypertension 07/14/2017  . Elevated LFTs 07/14/2017  . CKD (chronic kidney disease) stage 3, GFR 30-59 ml/min (HCC) 07/14/2017  . CAP (community acquired pneumonia) 06/30/2017  . Hypercholesteremia 06/30/2017  . Glaucoma 06/30/2017  . Anxiety 06/30/2017  . Acid reflux 06/30/2017  . Primary open angle glaucoma of both eyes, severe stage 06/03/2013  . Other and combined forms of senile cataract 09/10/2011  . Osteoarthritis 07/21/2011  . Altered mental status 07/21/2011  . COPD (chronic obstructive pulmonary disease) ([place]) 07/21/2011  . UTI (lower urinary tract infection) 07/21/2011  . Heart failure ([place]) 07/09/2011  . Hyperkalemia 07/09/2011  . Type 2 diabetes mellitus with hyperglycemia ([place]) 07/09/2011  . Cataract 06/04/2011  . TOTAL HIP FOLLOW-UP 04/14/2008  . HIP PAIN 03/17/2008  . SCIATICA 03/17/2008  . CUBITAL TUNNEL SYNDROME 02/10/2008  . KNEE, ARTHRITIS, DEGEN./OSTEO 02/10/2008  . KNEE PAIN 02/10/2008  . NECK PAIN 12/24/2007  . SHOULDER PAIN 11/17/2007  . RUPTURE ROTATOR CUFF 11/17/2007  . Type 2 diabetes mellitus ([place]) 05/13/2007   PCP:  Celene Squibb, MD Pharmacy:   [id number] [place] [address] [id number]: [phone]: [phone]    Social Determinants of Health (SDOH) Interventions    Readmission Risk Interventions No flowsheet data  found.  MEmeterio Reeve LLatanya Presser LRockwellSocial Worker [phone]

## 2020-06-15 NOTE — Progress Notes (Signed)
Physical Therapy Treatment Patient Details Name: [name] MRN: [id number] DOB: [birth year] Today's Date: 06/15/2020    History of Present Illness Pt is an 86 y.o. female admitted 06/12/20 after fall sustaining L intertrochanteric hip fx; pt reports hitting head on furniture, but denies LOC. S/p L hip ORIF 10/18. PMH includes glaucoma, DM, COPD, CKD, HTN, legally blind R eye, DM2, panic attacks, depression, anxiety.   PT Comments    Pt slowly progressing with mobility. Today's session focused on LLE ROM (pt limited by significant post-op pain and stiffness), bed mobility and seated EOB activity; pt requires maxA+2. Unable to stand, maxA+2 to perform scooting transfers. Will require maximove lift for OOB activity if pt can tolerate. Continued to recommend SNF-level therapies to maximize functional mobility and decrease caregiver burden.   Follow Up Recommendations  SNF;Supervision/Assistance - 24 hour (family may decline)     Equipment Recommendations   (TBD)    Recommendations for Other Services       Precautions / Restrictions Precautions Precautions: Fall;Other (comment) Precaution Comments: Legally blind Restrictions Weight Bearing Restrictions: Yes LLE Weight Bearing: Partial weight bearing LLE Partial Weight Bearing Percentage or Pounds: 50% PWB    Mobility  Bed Mobility Overal bed mobility: Needs Assistance Bed Mobility: Supine to Sit;Sit to Supine     Supine to sit: Max assist;+2 for physical assistance;HOB elevated Sit to supine: Total assist;+2 for physical assistance   General bed mobility comments: Pt with significant pain with any LLE movement, significant increased time and effort, maxA+2 to manage BLEs, scoot hips and assist trunk elevation; totalA return to supine and repositioning  Transfers Overall transfer level: Needs assistance   Transfers: Lateral/Scoot Transfers           General transfer comment: Pt able to minimally offload buttocks from  EOB with BUE support and maxA+2 with bed pad, unable to initiate standing beyond this due to significant LLE pain; perform 2x bouts of lateral scooting towards HOB with BUE support and maxA+2 with bed pad  Ambulation/Gait                 Stairs             Wheelchair Mobility    Modified Rankin (Stroke Patients Only)       Balance Overall balance assessment: Needs assistance   Sitting balance-Leahy Scale: Fair Sitting balance - Comments: Prolonged sitting EOB with supervision for safety; 2x bouts of posterior LOB secondary to sharp LLE pain onset requiring assist to correct                                    Cognition Arousal/Alertness: Awake/alert Behavior During Therapy: [facility] for tasks assessed/performed;Anxious Overall Cognitive Status: History of cognitive impairments - at baseline Area of Impairment: Attention;Memory;Following commands;Safety/judgement;Awareness;Problem solving                   Current Attention Level: Sustained Memory: Decreased recall of precautions;Decreased short-term memory Following Commands: Follows one step commands with increased time;Follows multi-step commands inconsistently Safety/Judgement: Decreased awareness of safety;Decreased awareness of deficits Awareness: Emergent Problem Solving: Slow processing;Decreased initiation;Difficulty sequencing;Requires verbal cues General Comments: Daughter with safety concerns regarding pt (sounds like forgetful, poor insight into deficits at baseline). Pt distracted by pain, requiring frequent cues to stay on task. Anxious regarding pain, but still pleasant and willing to participate with encouragement      Exercises Other Exercises Other Exercises:  Supine LLE PROM/AAROM prior to initiating bed mobility - including heel slides, abd/add, SAQ (over therapist's arm) -- significant pain with limited motion, slight improvement with multiple repetitions    General Comments  General comments (skin integrity, edema, etc.): Grandaughter present and supportive; SpO2 91-99% on RA (not always reliable pleth), DOE up to 3/4 with activity      Pertinent Vitals/Pain Pain Assessment: Faces Faces Pain Scale: Hurts worst Pain Location: LLE with mobility Pain Descriptors / Indicators: Grimacing;Guarding;Operative site guarding;Moaning Pain Intervention(s): Premedicated before session;Monitored during session;Limited activity within patient's tolerance    Home Living                      Prior Function            PT Goals (current goals can now be found in the care plan section) Progress towards PT goals: Progressing toward goals (slowly)    Frequency    Min 3X/week      PT Plan Current plan remains appropriate    Co-evaluation PT/OT/SLP Co-Evaluation/Treatment: Yes Reason for Co-Treatment: For patient/therapist safety;To address functional/ADL transfers PT goals addressed during session: Mobility/safety with mobility;Balance        AM-PAC PT "6 Clicks" Mobility   Outcome Measure  Help needed turning from your back to your side while in a flat bed without using bedrails?: Total Help needed moving from lying on your back to sitting on the side of a flat bed without using bedrails?: Total Help needed moving to and from a bed to a chair (including a wheelchair)?: Total Help needed standing up from a chair using your arms (e.g., wheelchair or bedside chair)?: Total Help needed to walk in hospital room?: Total Help needed climbing 3-5 steps with a railing? : Total 6 Click Score: 6    End of Session Equipment Utilized During Treatment: Gait belt Activity Tolerance: Patient limited by pain Patient left: in bed;with call bell/phone within reach;with bed alarm set;with family/visitor present Nurse Communication: Mobility status;Need for lift equipment PT Visit Diagnosis: Other abnormalities of gait and mobility (R26.89);Muscle weakness  (generalized) (M62.81);Pain Pain - Right/Left: Left Pain - part of body: Hip;Leg     Time: 1030-1103 PT Time Calculation (min) (ACUTE ONLY): 33 min  Charges:  $Therapeutic Activity: 8-22 mins                     Mabeline Caras, PT, DPT Acute Rehabilitation Services  Pager [phone] Office [place] 06/15/2020, 12:11 PM

## 2020-06-15 NOTE — Plan of Care (Signed)
  Problem: Education: Goal: Knowledge of General Education information will improve Description: Including pain rating scale, medication(s)/side effects and non-pharmacologic comfort measures Outcome: Progressing   Problem: Health Behavior/Discharge Planning: Goal: Ability to manage health-related needs will improve Outcome: Progressing   Problem: Activity: Goal: Risk for activity intolerance will decrease Outcome: Progressing   Problem: Coping: Goal: Level of anxiety will decrease Outcome: Progressing   Problem: Elimination: Goal: Will not experience complications related to bowel motility Outcome: Progressing   Problem: Safety: Goal: Ability to remain free from injury will improve Outcome: Progressing   Problem: Skin Integrity: Goal: Risk for impaired skin integrity will decrease Outcome: Progressing   

## 2020-06-16 DIAGNOSIS — E559 Vitamin D deficiency, unspecified: Secondary | ICD-10-CM

## 2020-06-16 DIAGNOSIS — E669 Obesity, unspecified: Secondary | ICD-10-CM

## 2020-06-16 LAB — BASIC METABOLIC PANEL
Anion gap: 7 (ref 5–15)
BUN: 31 mg/dL — ABNORMAL HIGH (ref 8–23)
CO2: 26 mmol/L (ref 22–32)
Calcium: 7.9 mg/dL — ABNORMAL LOW (ref 8.9–10.3)
Chloride: 104 mmol/L (ref 98–111)
Creatinine, Ser: 1.66 mg/dL — ABNORMAL HIGH (ref 0.44–1.00)
GFR, Estimated: 27 mL/min — ABNORMAL LOW (ref >60)
Glucose, Bld: 114 mg/dL — ABNORMAL HIGH (ref 70–99)
Potassium: 3.7 mmol/L (ref 3.5–5.1)
Sodium: 137 mmol/L (ref 135–145)

## 2020-06-16 LAB — GLUCOSE, CAPILLARY
Glucose-Capillary: 104 mg/dL — ABNORMAL HIGH (ref 70–99)
Glucose-Capillary: 110 mg/dL — ABNORMAL HIGH (ref 70–99)
Glucose-Capillary: 176 mg/dL — ABNORMAL HIGH (ref 70–99)

## 2020-06-16 MED ORDER — HYDROCODONE-ACETAMINOPHEN 5-325 MG PO TABS
1.0000 | ORAL_TABLET | Freq: Four times a day (QID) | ORAL | 0 refills | Status: DC | PRN
Start: 2020-06-16 — End: 2021-04-11

## 2020-06-16 NOTE — TOC Transition Note (Signed)
Transition of Care [facility]) - CM/SW Discharge Note   Patient Details  Name: [name] MRN: [id number] Date of Birth: [birth year]  Transition of Care [facility]) CM/SW Contact:  Sharin Mons, RN Phone Number: [phone] 06/16/2020, 2:09 PM   Clinical Narrative:    Patient will DC to: Home Anticipated DC date: 06/16/2020 Family notified: yes, [name] and [name] Transport by: Corey Harold   Per MD patient ready for DC today.RN, patient, and patient's family notified of DC. Referral made with [facility] for [facility] services and accepted , [facility] 06/17/2020. Ambulance transport requested for patient.   RNCM will sign off for now as intervention is no longer needed. Please consult [place] again if new needs arise.    Final next level of care: [place] Barriers to Discharge: No Barriers Identified   Patient Goals and CMS Choice Patient states their goals for this hospitalization and ongoing recovery are:: to return home CMS Medicare.gov Compare Post Acute Care list provided to:: Patient Choice offered to / list presented to : Patient  Discharge Placement      Discharge Plan and Services     HH Arranged: PT, OT [facility] Agency: [place] ([facility]) Date [facility] Agency Contacted: 06/16/20 Time [place]: [place] Representative spoke with at [place]: [place] ([employer]) Interventions     Readmission Risk Interventions No flowsheet data found.

## 2020-06-16 NOTE — Progress Notes (Signed)
HD#4 Subjective:  Overnight Events: none  Patient's son [name] at bedside. Patient states she is feeling well. Reports L hip pain, asks for a pain prescription for home. Notes that she doesn't understand why her L hip hurts when she moves her right leg. Explained to patient that the other hip stabilizes when the contralateral leg moves. Expresses gratitude to everyone who have cared for her in the hospital. Confirms desire to go home. Expresses understanding that recovery will be hard work.   No other complaints or concerns at this time.   Objective:  Vital signs in last 24 hours: Vitals:   06/15/20 0822 06/15/20 1500 06/15/20 2000 06/16/20 0300  BP: 94/61 108/70 109/78 139/88  Pulse: 100 90 78 68  Resp: 17     Temp: 97.8 F (36.6 C) 98.7 F (37.1 C) 98.7 F (37.1 C) 98.3 F (36.8 C)  TempSrc: Oral Oral Oral Axillary  SpO2: 100% 98% 97% 96%  Weight:      Height:       Supplemental O2: Room Air  Physical Exam:  Physical Exam Vitals and nursing note reviewed.  Constitutional:      Appearance: She is obese.  HENT:     Head: Atraumatic.  Cardiovascular:     Rate and Rhythm: Normal rate and regular rhythm.     Pulses: Normal pulses.     Heart sounds: Normal heart sounds. No murmur heard.  No gallop.   Pulmonary:     Effort: Pulmonary effort is normal. No respiratory distress.  Musculoskeletal:     Comments: Left upper extremity, painful to move. Bandage in place. Patient able to move toes without discomfort.   Skin:    General: Skin is warm and dry.  Neurological:     General: No focal deficit present.     Mental Status: She is alert and oriented to person, place, and time. Mental status is at baseline.  Psychiatric:        Mood and Affect: Mood normal.        Behavior: Behavior normal.     Filed Weights   06/12/20 2120  Weight: 85.7 kg     Intake/Output Summary (Last 24 hours) at 06/16/2020 0746 Last data filed at 06/16/2020 0300 Gross per 24 hour    Intake 480 ml  Output 1950 ml  Net -1470 ml   Net IO Since Admission: 468.23 mL [06/16/20 0746]  Pertinent Labs: CBC Latest Ref Rng & Units 06/15/2020 06/14/2020 06/13/2020  WBC 4.0 - 10.5 K/uL 10.1 12.2(H) 12.5(H)  Hemoglobin 12.0 - 15.0 g/dL 9.8(L) 10.4(L) 12.2  Hematocrit 36 - 46 % 30.0(L) 32.2(L) 38.2  Platelets 150 - 400 K/uL 213 224 239    CMP Latest Ref Rng & Units 06/16/2020 06/15/2020 06/14/2020  Glucose 70 - 99 mg/dL 114(H) 109(H) 238(H)  BUN 8 - 23 mg/dL 31(H) 33(H) 24(H)  Creatinine 0.44 - 1.00 mg/dL 1.66(H) 1.92(H) 1.50(H)  Sodium 135 - 145 mmol/L 137 135 136  Potassium 3.5 - 5.1 mmol/L 3.7 3.9 4.5  Chloride 98 - 111 mmol/L 104 103 104  CO2 22 - 32 mmol/L 26 24 22   Calcium 8.9 - 10.3 mg/dL 7.9(L) 7.9(L) 8.3(L)  Total Protein 6.5 - 8.1 g/dL - - -  Total Bilirubin 0.3 - 1.2 mg/dL - - -  Alkaline Phos 38 - 126 U/L - - -  AST 15 - 41 U/L - - -  ALT 0 - 44 U/L - - -    Imaging: No results  found.  Assessment/Plan:   Principal Problem:   Closed comminuted intertrochanteric fracture of left femur (HCC) Active Problems:   Essential hypertension   CKD (chronic kidney disease) stage 3, GFR 30-59 ml/min (HCC)   Type 2 diabetes mellitus with hyperglycemia (HCC)   Vitamin D deficiency   Patient Summary: [name] a 86 y.o.year old femalewith hx of A fib on xarelto, HFpEF, DM, HTN, HLD, COPD, CKD 3, GERD, depression, anxiety, legally blind in R eyepresenting for fall with L hip pain.Xray with comminuted intratrochanteric left femoral fracture,ORIF repair on 06/13/20  Comminuted Intertrochanteric Fracture of The Proximal Left Femur Mechanical fall Repair with ORIF 06/13/20 Post op day 3 for ORIF. Procedure went well without complications. Patient states her pain continues to be well controlled with pain medication. Patient and family continue to refuse SNF placement despite PT/OT recommendations. -patient home today with home health/pt -pain control  withacetaminophen 650 mg q6h prn mild pain, norco 1-2 tab q4h PRN for moderate pain, will discharge -vitamin D 25 hydroxy - 16.13, followed by nephrology on outpatient basis for low vitamin D levels.     Diet: Heart Healthy IVF: None,None VTE: SCDs Code: Full PT/OT recs: None, none.  Dispo: Anticipated discharge to Home today with HH/PT  [facility] Resident PGY-1 Pager [phone] Please contact the on call pager after 5 pm and on weekends at [phone].

## 2020-06-16 NOTE — Consult Note (Signed)
   [facility] Inpatient Consult   06/16/2020  [name] 06-Jun-1929 [id number]   [facility] [[facility]] Patient:  Medicare Next[employer]   Patient screened for high risk score for unplanned readmission to check for potential [place] Management service needs.  Review of patient's medical record reveals patient is recommended for SNF however wants to return home per Physical Therapy notes.  Primary Care Provider is  Celene Squibb, MD this provider is listed to provide the transition of care [TOC] for post hospital follow up.   Plan:  For care management needs to assign as call attempts to hospital room was not successful.  Notes reviewed and patient awaiting transition via PTAR. Will assign to have services offered for post hospital support.  For questions contact:   Natividad Brood, RN BSN [facility] Liaison  [phone] business mobile phone Toll free office [phone]  Fax number: [phone] [email or web address]

## 2020-06-16 NOTE — Progress Notes (Signed)
Physical Therapy Treatment Patient Details Name: [name] MRN: [id number] DOB: April 02, 1929 Today's Date: 06/16/2020    History of Present Illness Pt is an 86 y.o. female admitted 06/12/20 after fall sustaining L intertrochanteric hip fx; pt reports hitting head on furniture, but denies LOC. S/p L hip ORIF 10/18. PMH includes glaucoma, DM, COPD, CKD, HTN, legally blind R eye, DM2, panic attacks, depression, anxiety.   PT Comments    Pt progressing with mobility. Improved pain and tolerance to BLE AAROM this session. Trialled lift from bed to recliner with maximove, pt tolerated well despite LLE pain (has hoyer lift for home use). Pt motivated for return home today. Son Legrand Como) present to discuss this and d/c needs. If pt to remain admitted, will continue to follow acutely.    Follow Up Recommendations  SNF;Supervision/Assistance - 24 hour (family declined)     Equipment Recommendations   (TBD)    Recommendations for Other Services       Precautions / Restrictions Precautions Precautions: Fall;Other (comment) Precaution Comments: Legally blind Restrictions Weight Bearing Restrictions: Yes LLE Weight Bearing: Partial weight bearing LLE Partial Weight Bearing Percentage or Pounds: 50% PWB    Mobility  Bed Mobility Overal bed mobility: Needs Assistance Bed Mobility: Rolling Rolling: Mod assist;+2 for physical assistance;Max assist         General bed mobility comments: Mod-maxA+2 to roll R/L for washup and lift pad placement, verbal/tactile cues to use bed rails and pt assisting well with BUE support; once rolled onto side, pt able to maintain position with minA and BUE support  Transfers Overall transfer level: Needs assistance               General transfer comment: Transfer from bed to recliner with maximove lift, pt tolerated well despite LLE pain  Ambulation/Gait             General Gait Details: Unable   Stairs             Wheelchair  Mobility    Modified Rankin (Stroke Patients Only)       Balance Overall balance assessment: Needs assistance   Sitting balance-Leahy Scale: Fair Sitting balance - Comments: Reliant on UE support to pull self to edge of chair, but able to maintain without UE support and min guard                                    Cognition Arousal/Alertness: Awake/alert Behavior During Therapy: WFL for tasks assessed/performed;Anxious Overall Cognitive Status: History of cognitive impairments - at baseline Area of Impairment: Attention;Memory;Following commands;Safety/judgement;Awareness;Problem solving                   Current Attention Level: Sustained;Selective Memory: Decreased recall of precautions;Decreased short-term memory Following Commands: Follows one step commands with increased time;Follows multi-step commands inconsistently Safety/Judgement: Decreased awareness of safety;Decreased awareness of deficits Awareness: Emergent Problem Solving: Slow processing;Decreased initiation;Difficulty sequencing;Requires verbal cues        Exercises Other Exercises Other Exercises: AAROM BLE knee flex/ext, hip flex/ext (improving tolerance)    General Comments General comments (skin integrity, edema, etc.): RN/NT present for +2 assist with lifting equipment. Son Legrand Como) present and supportive; pt plans to d/c home today with family support, will require PTAR transport      Pertinent Vitals/Pain Pain Assessment: Faces Faces Pain Scale: Hurts even more Pain Location: LLE with mobility Pain Descriptors / Indicators: Grimacing;Guarding;Operative site guarding;Moaning  Pain Intervention(s): Monitored during session;Limited activity within patient's tolerance;Repositioned;Ice applied    Home Living                      Prior Function            PT Goals (current goals can now be found in the care plan section) Progress towards PT goals: Progressing toward  goals    Frequency    Min 3X/week      PT Plan Current plan remains appropriate    Co-evaluation              AM-PAC PT "6 Clicks" Mobility   Outcome Measure  Help needed turning from your back to your side while in a flat bed without using bedrails?: Total Help needed moving from lying on your back to sitting on the side of a flat bed without using bedrails?: Total Help needed moving to and from a bed to a chair (including a wheelchair)?: Total Help needed standing up from a chair using your arms (e.g., wheelchair or bedside chair)?: Total Help needed to walk in hospital room?: Total Help needed climbing 3-5 steps with a railing? : Total 6 Click Score: 6    End of Session   Activity Tolerance: Patient tolerated treatment well;Patient limited by pain Patient left: in chair;with call bell/phone within reach;with nursing/sitter in room Nurse Communication: Mobility status;Need for lift equipment PT Visit Diagnosis: Other abnormalities of gait and mobility (R26.89);Muscle weakness (generalized) (M62.81);Pain Pain - Right/Left: Left Pain - part of body: Hip;Leg     Time: 2257-5051 PT Time Calculation (min) (ACUTE ONLY): 25 min  Charges:  $Therapeutic Activity: 23-37 mins                    Mabeline Caras, PT, DPT Acute Rehabilitation Services  Pager [phone] Office [phone]  Derry Lory 06/16/2020, 12:44 PM

## 2020-06-17 NOTE — Progress Notes (Deleted)
Name: [name] MRN: [id number] DOB: [birth year] 86 y.o. PCP: Celene Squibb, MD  Date of Admission: 06/12/2020  9:00 PM Date of Discharge: 06/16/2020 Attending Physician: Dr. Angelia Mould  Discharge Diagnosis: Principal Problem:   Closed comminuted intertrochanteric fracture of left femur [facility]) Active Problems:   Essential hypertension   CKD (chronic kidney disease) stage 3, GFR 30-59 ml/min (HCC)   Type 2 diabetes mellitus with hyperglycemia (HCC)   Vitamin D deficiency    Discharge Medications: Allergies as of 06/16/2020      Reactions   Hydromorphone Hcl Other (See Comments)   Patient goes out of right state of mind.       Medication List    TAKE these medications   amLODipine 5 MG tablet Commonly known as: NORVASC Take 5 mg by mouth daily.   Durezol 0.05 % Emul Generic drug: Difluprednate Apply 1 drop to eye daily.   furosemide 20 MG tablet Commonly known as: LASIX Take 20 mg daily as needed by mouth for fluid.   gabapentin 100 MG capsule Commonly known as: NEURONTIN TAKE 1 CAPSULE BY MOUTH THREE TIMES A DAY What changed: See the new instructions.   glipiZIDE 5 MG tablet Commonly known as: GLUCOTROL Take 5 mg 2 (two) times daily by mouth.   HYDROcodone-acetaminophen 5-325 MG tablet Commonly known as: Norco Take 1-2 tablets by mouth every 6 (six) hours as needed for moderate pain or severe pain.   Lokelma 10 g Pack packet Generic drug: sodium zirconium cyclosilicate Take 1 packet by mouth daily.   omeprazole 20 MG capsule Commonly known as: PRILOSEC Take 20 mg by mouth daily.   ondansetron 4 MG tablet Commonly known as: ZOFRAN Take 4 mg by mouth every 8 (eight) hours as needed for nausea/vomiting.   pravastatin 40 MG tablet Commonly known as: PRAVACHOL Take 40 mg by mouth daily.   traMADol 50 MG tablet Commonly known as: ULTRAM Take 1 tablet (50 mg total) by mouth every 12 (twelve) hours as needed for severe pain.   Tyler Aas FlexTouch 100  UNIT/ML FlexTouch Pen Generic drug: insulin degludec Inject 10 Units into the skin daily.   Xarelto 15 MG Tabs tablet Generic drug: Rivaroxaban Take 15 mg by mouth daily.            Discharge Care Instructions  (From admission, onward)         Start     Ordered   06/16/20 0000  Leave dressing on - Keep it clean, dry, and intact until clinic visit        06/16/20 1105   06/15/20 0000  Partial weight bearing       Question Answer Comment  % Body Weight 50   Laterality left   Extremity Lower      06/15/20 0750          Disposition and follow-up:   Ms.[name] was discharged from [facility] in Stable condition.  At the hospital follow up visit please address:  1.  Follow-up:  A. Orthopedics - post ORIF - schedule follow up within 2 weeks of discharge    B. COVID Vaccination   2.  Labs / imaging needed at time of follow-up: None  3.  Pending labs/ test needing follow-up: None  4.  Medication Changes: None  Follow-up Appointments:  Follow-up Information    Michaela Cancel, MD. Schedule an appointment as soon as possible for a visit in 2 weeks.   Specialty: Orthopedic Surgery Contact  information: [address] [place] [address] [phone]        Celene Squibb, MD Follow up in 1 week(s).   Specialty: Internal Medicine Contact information: [place] [address] [phone]        Michaela Lenis, MD .   Specialty: Cardiology Contact information: [address] [place] [address] [facility] Course by problem list:  Comminuted Intertrochanteric Fracture of The Proximal Left Femur Patient admitted after having mechanical fall and found having an intertrochanteric left femoral fracture. Upon initial evaluation she was found to have externally rotated left leg and was neurologically intact. No other injuries were seen on imaging. She was found to have  a vitamin D 25 hydroxy level of 16.13, patient stated this was being monitored on an outpatient basis. She was evaluated by orthopedics and taken to the OR on 10/18 for an open reduction internal fixation. The patient tolerated the procedure well. Post operatively the patient had pain that was controlled with PO norco and acetaminophen. She was restarted on her xarelto at this time. It was recommended by myself, my team, and physical therapy for the patient to attend a SNF facility, however, she refused and wanted to be home where her family could take care of her. She was discharged home with home health, physical therapy, and follow up with orthopedics and her primary care provider   COVID-19 Vaccination It was discussed with the patient if she would like to receive her COVID-19 vaccine while admitted to the hospital. The patient refused the vaccine as she recently recovered from [place]. She does endorse wanting to receive the vaccine once she recovers from her hip fracture. Encouraged patient to follow up with primary care provider.   Discharge Vitals:   BP 124/82 (BP Location: Left Arm)   Pulse 85   Temp 97.7 F (36.5 C) (Oral)   Resp 18   Ht 5\' 2"  (1.575 m)   Wt 85.7 kg   SpO2 100%   BMI 34.57 kg/m   Pertinent Labs, Studies, and Procedures:  CBC Latest Ref Rng & Units 06/15/2020 06/14/2020 06/13/2020  WBC 4.0 - 10.5 K/uL 10.1 12.2(H) 12.5(H)  Hemoglobin 12.0 - 15.0 g/dL 9.8(L) 10.4(L) 12.2  Hematocrit 36 - 46 % 30.0(L) 32.2(L) 38.2  Platelets 150 - 400 K/uL 213 224 239    CMP Latest Ref Rng & Units 06/16/2020 06/15/2020 06/14/2020  Glucose 70 - 99 mg/dL 114(H) 109(H) 238(H)  BUN 8 - 23 mg/dL 31(H) 33(H) 24(H)  Creatinine 0.44 - 1.00 mg/dL 1.66(H) 1.92(H) 1.50(H)  Sodium 135 - 145 mmol/L 137 135 136  Potassium 3.5 - 5.1 mmol/L 3.7 3.9 4.5  Chloride 98 - 111 mmol/L 104 103 104  CO2 22 - 32 mmol/L 26 24 22   Calcium 8.9 - 10.3 mg/dL 7.9(L) 7.9(L) 8.3(L)  Total Protein 6.5 - 8.1 g/dL  - - -  Total Bilirubin 0.3 - 1.2 mg/dL - - -  Alkaline Phos 38 - 126 U/L - - -  AST 15 - 41 U/L - - -  ALT 0 - 44 U/L - - -    DG Chest 1 View  Result Date: 06/12/2020 CLINICAL DATA:  Low back pain recent fall with chest pain, initial encounter EXAM: CHEST  1 VIEW COMPARISON:  05/12/2020 FINDINGS: Cardiac shadow is stable. Aortic calcifications and tortuosity are seen. The lungs are clear bilaterally. No  acute bony abnormality is seen. IMPRESSION: No acute abnormality noted. Electronically Signed   By: Inez Catalina M.D.   On: 06/12/2020 22:20   DG Lumbar Spine 2-3 Views  Result Date: 06/12/2020 CLINICAL DATA:  Low back pain following fall, initial encounter EXAM: LUMBAR SPINE - 3 VIEW COMPARISON:  09/17/2018 FINDINGS: Five lumbar type vertebral bodies are well visualized. Vertebral body height is well maintained. Mild degenerative anterolisthesis of L4 on L5 is noted. No other focal abnormality is seen. Right hip replacement is noted. IMPRESSION: Degenerative anterolisthesis of L4 on L5. This is stable from the previous exam. Electronically Signed   By: Inez Catalina M.D.   On: 06/12/2020 22:17   DG Knee 1-2 Views Left  Result Date: 06/12/2020 CLINICAL DATA:  Recent fall with left knee pain, initial encounter EXAM: LEFT KNEE - 2 VIEW COMPARISON:  None. FINDINGS: Degenerative changes of the knee joint are seen. No acute fracture or dislocation is noted. IMPRESSION: Degenerative change without acute abnormality. Electronically Signed   By: Inez Catalina M.D.   On: 06/12/2020 22:22   CT Head Wo Contrast  Result Date: 06/12/2020 CLINICAL DATA:  86 year old female with head trauma. EXAM: CT HEAD WITHOUT CONTRAST CT CERVICAL SPINE WITHOUT CONTRAST TECHNIQUE: Multidetector CT imaging of the head and cervical spine was performed following the standard protocol without intravenous contrast. Multiplanar CT image reconstructions of the cervical spine were also generated. COMPARISON:  None. FINDINGS: CT  HEAD FINDINGS Brain: There is moderate age-related atrophy and chronic microvascular ischemic changes. There is no acute intracranial hemorrhage. No mass effect or midline shift. No extra-axial fluid collection. Vascular: No hyperdense vessel or unexpected calcification. Skull: Normal. Negative for fracture or focal lesion. Sinuses/Orbits: Mild mucoperiosteal thickening of paranasal sinuses with partial opacification of the right sphenoid sinus. No air-fluid level. The mastoid air cells are clear. Other: None CT CERVICAL SPINE FINDINGS Alignment: No acute subluxation. Skull base and vertebrae: No acute fracture.  Osteopenia. Soft tissues and spinal canal: No prevertebral fluid or swelling. No visible canal hematoma. Disc levels:  Multilevel degenerative changes. Upper chest: Mild emphysema. Moderate atherosclerotic calcification of the aortic arch. The aortic arch is mildly dilated measuring up to 3.8 cm in diameter. Other: Bilateral carotid bulb calcified plaques. IMPRESSION: 1. No acute intracranial pathology. Moderate age-related atrophy and chronic microvascular ischemic changes. 2. No acute/traumatic cervical spine pathology. 3. Aortic Atherosclerosis (ICD10-I70.0). Electronically Signed   By: Anner Crete M.D.   On: 06/12/2020 21:37   CT Cervical Spine Wo Contrast  Result Date: 06/12/2020 CLINICAL DATA:  86 year old female with head trauma. EXAM: CT HEAD WITHOUT CONTRAST CT CERVICAL SPINE WITHOUT CONTRAST TECHNIQUE: Multidetector CT imaging of the head and cervical spine was performed following the standard protocol without intravenous contrast. Multiplanar CT image reconstructions of the cervical spine were also generated. COMPARISON:  None. FINDINGS: CT HEAD FINDINGS Brain: There is moderate age-related atrophy and chronic microvascular ischemic changes. There is no acute intracranial hemorrhage. No mass effect or midline shift. No extra-axial fluid collection. Vascular: No hyperdense vessel or  unexpected calcification. Skull: Normal. Negative for fracture or focal lesion. Sinuses/Orbits: Mild mucoperiosteal thickening of paranasal sinuses with partial opacification of the right sphenoid sinus. No air-fluid level. The mastoid air cells are clear. Other: None CT CERVICAL SPINE FINDINGS Alignment: No acute subluxation. Skull base and vertebrae: No acute fracture.  Osteopenia. Soft tissues and spinal canal: No prevertebral fluid or swelling. No visible canal hematoma. Disc levels:  Multilevel degenerative changes. Upper chest: Mild emphysema. Moderate  atherosclerotic calcification of the aortic arch. The aortic arch is mildly dilated measuring up to 3.8 cm in diameter. Other: Bilateral carotid bulb calcified plaques. IMPRESSION: 1. No acute intracranial pathology. Moderate age-related atrophy and chronic microvascular ischemic changes. 2. No acute/traumatic cervical spine pathology. 3. Aortic Atherosclerosis (ICD10-I70.0). Electronically Signed   By: Anner Crete M.D.   On: 06/12/2020 21:37   DG C-Arm 1-60 Min  Result Date: 06/13/2020 CLINICAL DATA:  ORIF left femur fracture EXAM: LEFT FEMUR 2 VIEWS; DG C-ARM 1-60 MIN COMPARISON:  06/12/2020 FINDINGS: Intertrochanteric fracture on the left has been fixed with a compression screw and locking intramedullary rod extending to the distal femur. Fracture in satisfactory alignment. IMPRESSION: Satisfactory ORIF left intertrochanteric fracture. Electronically Signed   By: Franchot Gallo M.D.   On: 06/13/2020 17:23   DG Hip Unilat W or Wo Pelvis 2-3 Views Left  Result Date: 06/12/2020 CLINICAL DATA:  Recent fall with left hip pain, initial encounter EXAM: DG HIP (WITH OR WITHOUT PELVIS) 3V LEFT COMPARISON:  None. FINDINGS: Pelvic ring is intact. There is a comminuted intratrochanteric fracture of the proximal left femur with impaction and angulation at the fracture site. Prior right hip replacement is seen. IMPRESSION: Comminuted intratrochanteric left  femoral fracture. Electronically Signed   By: Inez Catalina M.D.   On: 06/12/2020 22:21   DG FEMUR MIN 2 VIEWS LEFT  Result Date: 06/13/2020 CLINICAL DATA:  ORIF left femur fracture EXAM: LEFT FEMUR 2 VIEWS; DG C-ARM 1-60 MIN COMPARISON:  06/12/2020 FINDINGS: Intertrochanteric fracture on the left has been fixed with a compression screw and locking intramedullary rod extending to the distal femur. Fracture in satisfactory alignment. IMPRESSION: Satisfactory ORIF left intertrochanteric fracture. Electronically Signed   By: Franchot Gallo M.D.   On: 06/13/2020 17:23     Discharge Instructions: Discharge Instructions    AMB Referral to [facility] Management   Complete by: As directed    Post hospital referral: Please assign to [facility] Coordinator for complex care, PCP does TOC; patient recommended for skilled nursing but home with [facility], to daughter's home,  and disease management follow up calls and assess for further needs.  Questions please call:   Natividad Brood, RN BSN [facility] Liaison  [phone] business mobile phone Toll free office [phone]  Fax number: [phone] [email or web address]   Reason for consult: Post hospital follow up support   Diagnoses of: Other   Other Diagnosis: Femur Fx from Fall; Maximal assist returning home   Expected date of contact: 1-3 days (reserved for hospital discharges)   Call MD for:  difficulty breathing, headache or visual disturbances   Complete by: As directed    Call MD for:  extreme fatigue   Complete by: As directed    Call MD for:  persistant dizziness or light-headedness   Complete by: As directed    Call MD for:  persistant nausea and vomiting   Complete by: As directed    Call MD for:  redness, tenderness, or signs of infection (pain, swelling, redness, odor or green/yellow discharge around incision site)   Complete by: As directed    Call MD for:  severe  uncontrolled pain   Complete by: As directed    Call MD for:  temperature >100.4   Complete by: As directed    Diet - low sodium heart healthy   Complete by: As directed    Diet Carb Modified   Complete by: As directed    Increase  activity slowly   Complete by: As directed    Leave dressing on - Keep it clean, dry, and intact until clinic visit   Complete by: As directed    Partial weight bearing   Complete by: As directed    % Body Weight: 50   Laterality: left   Extremity: Lower     Signed: Riesa Pope, MD 06/17/2020, 6:31 AM   Pager: [phone]

## 2020-06-18 DIAGNOSIS — E1142 Type 2 diabetes mellitus with diabetic polyneuropathy: Secondary | ICD-10-CM | POA: Diagnosis not present

## 2020-06-18 DIAGNOSIS — H548 Legal blindness, as defined in USA: Secondary | ICD-10-CM | POA: Diagnosis not present

## 2020-06-18 DIAGNOSIS — Z7901 Long term (current) use of anticoagulants: Secondary | ICD-10-CM | POA: Diagnosis not present

## 2020-06-18 DIAGNOSIS — Z9181 History of falling: Secondary | ICD-10-CM | POA: Diagnosis not present

## 2020-06-18 DIAGNOSIS — K219 Gastro-esophageal reflux disease without esophagitis: Secondary | ICD-10-CM | POA: Diagnosis not present

## 2020-06-18 DIAGNOSIS — W19XXXD Unspecified fall, subsequent encounter: Secondary | ICD-10-CM | POA: Diagnosis not present

## 2020-06-18 DIAGNOSIS — F32A Depression, unspecified: Secondary | ICD-10-CM | POA: Diagnosis not present

## 2020-06-18 DIAGNOSIS — I5032 Chronic diastolic (congestive) heart failure: Secondary | ICD-10-CM | POA: Diagnosis not present

## 2020-06-18 DIAGNOSIS — H409 Unspecified glaucoma: Secondary | ICD-10-CM | POA: Diagnosis not present

## 2020-06-18 DIAGNOSIS — Z794 Long term (current) use of insulin: Secondary | ICD-10-CM | POA: Diagnosis not present

## 2020-06-18 DIAGNOSIS — Z7401 Bed confinement status: Secondary | ICD-10-CM | POA: Diagnosis not present

## 2020-06-18 DIAGNOSIS — N183 Chronic kidney disease, stage 3 unspecified: Secondary | ICD-10-CM | POA: Diagnosis not present

## 2020-06-18 DIAGNOSIS — F410 Panic disorder [episodic paroxysmal anxiety] without agoraphobia: Secondary | ICD-10-CM | POA: Diagnosis not present

## 2020-06-18 DIAGNOSIS — I482 Chronic atrial fibrillation, unspecified: Secondary | ICD-10-CM | POA: Diagnosis not present

## 2020-06-18 DIAGNOSIS — E1122 Type 2 diabetes mellitus with diabetic chronic kidney disease: Secondary | ICD-10-CM | POA: Diagnosis not present

## 2020-06-18 DIAGNOSIS — E559 Vitamin D deficiency, unspecified: Secondary | ICD-10-CM | POA: Diagnosis not present

## 2020-06-18 DIAGNOSIS — S72145A Nondisplaced intertrochanteric fracture of left femur, initial encounter for closed fracture: Secondary | ICD-10-CM | POA: Diagnosis not present

## 2020-06-18 DIAGNOSIS — S72142D Displaced intertrochanteric fracture of left femur, subsequent encounter for closed fracture with routine healing: Secondary | ICD-10-CM | POA: Diagnosis not present

## 2020-06-18 DIAGNOSIS — E785 Hyperlipidemia, unspecified: Secondary | ICD-10-CM | POA: Diagnosis not present

## 2020-06-18 DIAGNOSIS — Z79891 Long term (current) use of opiate analgesic: Secondary | ICD-10-CM | POA: Diagnosis not present

## 2020-06-18 DIAGNOSIS — J449 Chronic obstructive pulmonary disease, unspecified: Secondary | ICD-10-CM | POA: Diagnosis not present

## 2020-06-18 DIAGNOSIS — I130 Hypertensive heart and chronic kidney disease with heart failure and stage 1 through stage 4 chronic kidney disease, or unspecified chronic kidney disease: Secondary | ICD-10-CM | POA: Diagnosis not present

## 2020-06-18 DIAGNOSIS — Z7984 Long term (current) use of oral hypoglycemic drugs: Secondary | ICD-10-CM | POA: Diagnosis not present

## 2020-06-18 DIAGNOSIS — E1165 Type 2 diabetes mellitus with hyperglycemia: Secondary | ICD-10-CM | POA: Diagnosis not present

## 2020-06-21 ENCOUNTER — Encounter (HOSPITAL_COMMUNITY): Payer: Self-pay | Admitting: Orthopedic Surgery

## 2020-06-21 DIAGNOSIS — N183 Chronic kidney disease, stage 3 unspecified: Secondary | ICD-10-CM | POA: Diagnosis not present

## 2020-06-21 DIAGNOSIS — I5032 Chronic diastolic (congestive) heart failure: Secondary | ICD-10-CM | POA: Diagnosis not present

## 2020-06-21 DIAGNOSIS — E1122 Type 2 diabetes mellitus with diabetic chronic kidney disease: Secondary | ICD-10-CM | POA: Diagnosis not present

## 2020-06-21 DIAGNOSIS — I130 Hypertensive heart and chronic kidney disease with heart failure and stage 1 through stage 4 chronic kidney disease, or unspecified chronic kidney disease: Secondary | ICD-10-CM | POA: Diagnosis not present

## 2020-06-21 DIAGNOSIS — S72142D Displaced intertrochanteric fracture of left femur, subsequent encounter for closed fracture with routine healing: Secondary | ICD-10-CM | POA: Diagnosis not present

## 2020-06-21 DIAGNOSIS — W19XXXD Unspecified fall, subsequent encounter: Secondary | ICD-10-CM | POA: Diagnosis not present

## 2020-06-22 ENCOUNTER — Encounter (HOSPITAL_COMMUNITY): Payer: Self-pay | Admitting: Orthopedic Surgery

## 2020-06-22 ENCOUNTER — Other Ambulatory Visit: Payer: Self-pay | Admitting: *Deleted

## 2020-06-22 DIAGNOSIS — I5032 Chronic diastolic (congestive) heart failure: Secondary | ICD-10-CM | POA: Diagnosis not present

## 2020-06-22 DIAGNOSIS — W19XXXD Unspecified fall, subsequent encounter: Secondary | ICD-10-CM | POA: Diagnosis not present

## 2020-06-22 DIAGNOSIS — E1122 Type 2 diabetes mellitus with diabetic chronic kidney disease: Secondary | ICD-10-CM | POA: Diagnosis not present

## 2020-06-22 DIAGNOSIS — I130 Hypertensive heart and chronic kidney disease with heart failure and stage 1 through stage 4 chronic kidney disease, or unspecified chronic kidney disease: Secondary | ICD-10-CM | POA: Diagnosis not present

## 2020-06-22 DIAGNOSIS — S72142D Displaced intertrochanteric fracture of left femur, subsequent encounter for closed fracture with routine healing: Secondary | ICD-10-CM | POA: Diagnosis not present

## 2020-06-22 DIAGNOSIS — N183 Chronic kidney disease, stage 3 unspecified: Secondary | ICD-10-CM | POA: Diagnosis not present

## 2020-06-22 NOTE — Patient Outreach (Signed)
[facility]) Care Management  06/22/2020  [name] Nov 27, 1928 [id number]    Initial Outreach 10/27  RN attempted outreach call today spoke briefly with daughter who indicates t just had therapy and was sleeping. Requested a call back.  RN will attempted another outreach at a later time for pending services with [facility].  Raina Mina, RN Care Management Coordinator [place] Office [phone]

## 2020-06-22 NOTE — Patient Outreach (Signed)
[facility]) Care Management  06/22/2020  [name] [birth year] [id number]   Transition of care (2nd outreach for the requested call back)  RN called back as requested today and spoke with the daughter once again. Daughter indicates pt remains unavailable and she was not able to talk at this time. RN again inquired on another day or time more convenient for the pt. Daughter really was not able to choose a date or time. RN offered to follow up once again on Friday afternoon (receptive).   Will follow up on Friday afternoon for pending [facility] services.  Raina Mina, RN Care Management Coordinator [place] Office [phone]

## 2020-06-23 ENCOUNTER — Other Ambulatory Visit: Payer: Self-pay | Admitting: *Deleted

## 2020-06-23 NOTE — Patient Outreach (Signed)
[facility]) Care Management  06/23/2020  ANJELI CASAD 25-Nov-1928 [id number]   EMMI-GENERAL DISCHARGE-RESOLVED RED ON EMMI ALERT Day #4 Date: 06/22/2020 Red Alert Reason: NO TRANSPORTATION  OUTREACH #1 RN spoke with pt who provided permission to speak with her daughter [name] concerning the above EMMI . Daughter states pt is not strong enough to get in the wheelchair for transport and her provider is requesting pt to visit the office on her 11/8 appointments. Encouraged caregiver for pt to continue working with the HHPT/OT which is 3 days weekly in building her strenght however other options provided. Offered social work consult with [facility] for additional options however daughter declined indicating she would get the pt to the appointments "one way or another".   Emmi resolved with no additional needs however pt is pending active status for [facility] services. _____________________________________________________________________________________  RN attempted to further engage with case management needs and daughter was initially receptive. RN inquired on the depression assessment as daughter sates pt is blind for the past years and is depressed. States she consulted with her provider and was given a prescription which was picked up yesterday from the pharmacy. RN attempted to completed the assessment however daughter declined to answer any additional questions related to the depression screening but indicates pt is not suicidal. Several areas discussed related to pt's ongoing medical issues. Reports the following:  1.  HTN- controlled with medication  2.  COPD- No reported issues again with use of medications 3.  HF-pt unable to stand to weight with an unsteady gait at this time but again no reported issues or symptoms 4.  DM-Reports her readings are around 135-140 in the morning with no reported issues however states these readings fluctuate.  Offered monthly or quarterly with  complex or health coach. Pt indecisive and initially indicated she would call for entry into the program however changed her mind and indicated pt would like to participate. Inquired on falls prevention due to recent femur fracture or focus on any of her medical conditions that further education maybe needed. Daughter indicated pt's Diabetes due to the fluctuating readings. RN attempted to gather additional information before the call was cut short due to pt care. RN offered to call back at a more convenient date and time for this caregiver. Requested at least 15-30 minutes of her time to completed the necessary information for program specific goals and interventions in helping this pt. Daughter requested a call back next Friday at 4:30pm.    Will sent outreach letters however pending provider notification on enrollment until the next follow up call Friday 11/5 when completed.  Raina Mina, RN Care Management Coordinator [place] Office [phone]

## 2020-06-24 ENCOUNTER — Ambulatory Visit: Payer: Self-pay | Admitting: *Deleted

## 2020-06-24 ENCOUNTER — Other Ambulatory Visit: Payer: Self-pay | Admitting: Orthopedic Surgery

## 2020-06-24 DIAGNOSIS — S72142D Displaced intertrochanteric fracture of left femur, subsequent encounter for closed fracture with routine healing: Secondary | ICD-10-CM | POA: Diagnosis not present

## 2020-06-24 DIAGNOSIS — I5032 Chronic diastolic (congestive) heart failure: Secondary | ICD-10-CM | POA: Diagnosis not present

## 2020-06-24 DIAGNOSIS — E1343 Other specified diabetes mellitus with diabetic autonomic (poly)neuropathy: Secondary | ICD-10-CM

## 2020-06-24 DIAGNOSIS — E1122 Type 2 diabetes mellitus with diabetic chronic kidney disease: Secondary | ICD-10-CM | POA: Diagnosis not present

## 2020-06-24 DIAGNOSIS — I130 Hypertensive heart and chronic kidney disease with heart failure and stage 1 through stage 4 chronic kidney disease, or unspecified chronic kidney disease: Secondary | ICD-10-CM | POA: Diagnosis not present

## 2020-06-24 DIAGNOSIS — N183 Chronic kidney disease, stage 3 unspecified: Secondary | ICD-10-CM | POA: Diagnosis not present

## 2020-06-24 DIAGNOSIS — W19XXXD Unspecified fall, subsequent encounter: Secondary | ICD-10-CM | POA: Diagnosis not present

## 2020-06-27 DIAGNOSIS — W19XXXD Unspecified fall, subsequent encounter: Secondary | ICD-10-CM | POA: Diagnosis not present

## 2020-06-27 DIAGNOSIS — S72142D Displaced intertrochanteric fracture of left femur, subsequent encounter for closed fracture with routine healing: Secondary | ICD-10-CM | POA: Diagnosis not present

## 2020-06-27 DIAGNOSIS — N183 Chronic kidney disease, stage 3 unspecified: Secondary | ICD-10-CM | POA: Diagnosis not present

## 2020-06-27 DIAGNOSIS — I5032 Chronic diastolic (congestive) heart failure: Secondary | ICD-10-CM | POA: Diagnosis not present

## 2020-06-27 DIAGNOSIS — E1122 Type 2 diabetes mellitus with diabetic chronic kidney disease: Secondary | ICD-10-CM | POA: Diagnosis not present

## 2020-06-27 DIAGNOSIS — I130 Hypertensive heart and chronic kidney disease with heart failure and stage 1 through stage 4 chronic kidney disease, or unspecified chronic kidney disease: Secondary | ICD-10-CM | POA: Diagnosis not present

## 2020-06-28 DIAGNOSIS — N183 Chronic kidney disease, stage 3 unspecified: Secondary | ICD-10-CM | POA: Diagnosis not present

## 2020-06-28 DIAGNOSIS — W19XXXD Unspecified fall, subsequent encounter: Secondary | ICD-10-CM | POA: Diagnosis not present

## 2020-06-28 DIAGNOSIS — S72142D Displaced intertrochanteric fracture of left femur, subsequent encounter for closed fracture with routine healing: Secondary | ICD-10-CM | POA: Diagnosis not present

## 2020-06-28 DIAGNOSIS — I130 Hypertensive heart and chronic kidney disease with heart failure and stage 1 through stage 4 chronic kidney disease, or unspecified chronic kidney disease: Secondary | ICD-10-CM | POA: Diagnosis not present

## 2020-06-28 DIAGNOSIS — E1122 Type 2 diabetes mellitus with diabetic chronic kidney disease: Secondary | ICD-10-CM | POA: Diagnosis not present

## 2020-06-28 DIAGNOSIS — I5032 Chronic diastolic (congestive) heart failure: Secondary | ICD-10-CM | POA: Diagnosis not present

## 2020-06-29 DIAGNOSIS — I130 Hypertensive heart and chronic kidney disease with heart failure and stage 1 through stage 4 chronic kidney disease, or unspecified chronic kidney disease: Secondary | ICD-10-CM | POA: Diagnosis not present

## 2020-06-29 DIAGNOSIS — I5032 Chronic diastolic (congestive) heart failure: Secondary | ICD-10-CM | POA: Diagnosis not present

## 2020-06-29 DIAGNOSIS — N183 Chronic kidney disease, stage 3 unspecified: Secondary | ICD-10-CM | POA: Diagnosis not present

## 2020-06-29 DIAGNOSIS — S72142D Displaced intertrochanteric fracture of left femur, subsequent encounter for closed fracture with routine healing: Secondary | ICD-10-CM | POA: Diagnosis not present

## 2020-06-29 DIAGNOSIS — E1122 Type 2 diabetes mellitus with diabetic chronic kidney disease: Secondary | ICD-10-CM | POA: Diagnosis not present

## 2020-06-29 DIAGNOSIS — W19XXXD Unspecified fall, subsequent encounter: Secondary | ICD-10-CM | POA: Diagnosis not present

## 2020-06-30 DIAGNOSIS — I5032 Chronic diastolic (congestive) heart failure: Secondary | ICD-10-CM | POA: Diagnosis not present

## 2020-06-30 DIAGNOSIS — N183 Chronic kidney disease, stage 3 unspecified: Secondary | ICD-10-CM | POA: Diagnosis not present

## 2020-06-30 DIAGNOSIS — S72142D Displaced intertrochanteric fracture of left femur, subsequent encounter for closed fracture with routine healing: Secondary | ICD-10-CM | POA: Diagnosis not present

## 2020-06-30 DIAGNOSIS — I130 Hypertensive heart and chronic kidney disease with heart failure and stage 1 through stage 4 chronic kidney disease, or unspecified chronic kidney disease: Secondary | ICD-10-CM | POA: Diagnosis not present

## 2020-06-30 DIAGNOSIS — W19XXXD Unspecified fall, subsequent encounter: Secondary | ICD-10-CM | POA: Diagnosis not present

## 2020-06-30 DIAGNOSIS — E1122 Type 2 diabetes mellitus with diabetic chronic kidney disease: Secondary | ICD-10-CM | POA: Diagnosis not present

## 2020-06-30 NOTE — Discharge Summary (Signed)
Name: [name] MRN: [id number] DOB: 1928/10/05 86 y.o. PCP: Michaela Squibb, MD  Date of Admission: 06/12/2020  9:00 PM Date of Discharge: 06/16/2020 Attending Physician: Dr. Angelia Vazquez  Discharge Diagnosis: Principal Problem:   Closed comminuted intertrochanteric fracture of left femur [facility]) Active Problems:   Essential hypertension   CKD (chronic kidney disease) stage 3, GFR 30-59 ml/min (HCC)   Type 2 diabetes mellitus with hyperglycemia (HCC)   Vitamin D deficiency    Discharge Medications: Allergies as of 06/16/2020      Reactions   Hydromorphone Hcl Other (See Comments)   Patient goes out of right state of mind.       Medication List    TAKE these medications   amLODipine 5 MG tablet Commonly known as: NORVASC Take 5 mg by mouth daily.   Durezol 0.05 % Emul Generic drug: Difluprednate Apply 1 drop to eye daily.   furosemide 20 MG tablet Commonly known as: LASIX Take 20 mg daily as needed by mouth for fluid.   gabapentin 100 MG capsule Commonly known as: NEURONTIN TAKE 1 CAPSULE BY MOUTH THREE TIMES A DAY What changed: See the new instructions.   glipiZIDE 5 MG tablet Commonly known as: GLUCOTROL Take 5 mg 2 (two) times daily by mouth.   HYDROcodone-acetaminophen 5-325 MG tablet Commonly known as: Norco Take 1-2 tablets by mouth every 6 (six) hours as needed for moderate pain or severe pain.   Lokelma 10 g Pack packet Generic drug: sodium zirconium cyclosilicate Take 1 packet by mouth daily.   omeprazole 20 MG capsule Commonly known as: PRILOSEC Take 20 mg by mouth daily.   ondansetron 4 MG tablet Commonly known as: ZOFRAN Take 4 mg by mouth every 8 (eight) hours as needed for nausea/vomiting.   pravastatin 40 MG tablet Commonly known as: PRAVACHOL Take 40 mg by mouth daily.   traMADol 50 MG tablet Commonly known as: ULTRAM Take 1 tablet (50 mg total) by mouth every 12 (twelve) hours as needed for severe pain.   Tyler Aas FlexTouch 100  UNIT/ML FlexTouch Pen Generic drug: insulin degludec Inject 10 Units into the skin daily.   Xarelto 15 MG Tabs tablet Generic drug: Rivaroxaban Take 15 mg by mouth daily.            Discharge Care Instructions  (From admission, onward)         Start     Ordered   06/16/20 0000  Leave dressing on - Keep it clean, dry, and intact until clinic visit        06/16/20 1105   06/15/20 0000  Partial weight bearing       Question Answer Comment  % Body Weight 50   Laterality left   Extremity Lower      06/15/20 0750          Disposition and follow-up:   Ms.[name] was discharged from [facility] in Stable condition.  At the hospital follow up visit please address:  1.  Follow-up:  A. Orthopedics - post ORIF - schedule follow up within 2 weeks of discharge    B. COVID Vaccination   2.  Labs / imaging needed at time of follow-up: None  3.  Pending labs/ test needing follow-up: None  4.  Medication Changes: None  Follow-up Appointments:  Follow-up Information    Michaela Cancel, MD. Schedule an appointment as soon as possible for a visit in 2 weeks.   Specialty: Orthopedic Surgery Contact  information: [address] [place] [address] [phone]        Michaela Squibb, MD Follow up in 1 week(s).   Specialty: Internal Medicine Contact information: [place] [address] [phone]        Michaela Lenis, MD .   Specialty: Cardiology Contact information: [address] [place] [address] [place] [facility] Course by problem list:  Comminuted Intertrochanteric Fracture of The Proximal Left Femur Patient admitted after having mechanical fall and found having an intertrochanteric left femoral fracture. Upon initial evaluation she was found to have externally rotated left leg and was neurologically intact. No other injuries were seen on imaging. She was found to have  a vitamin D 25 hydroxy level of 16.13, patient stated this was being monitored on an outpatient basis. She was evaluated by orthopedics and taken to the OR on 10/18 for an open reduction internal fixation. The patient tolerated the procedure well. Post operatively the patient had pain that was controlled with PO norco and acetaminophen. She was restarted on her xarelto at this time. It was recommended by myself, my team, and physical therapy for the patient to attend a SNF facility, however, she refused and wanted to be home where her family could take care of her. She was discharged home with home health, physical therapy, and follow up with orthopedics and her primary care provider   COVID-19 Vaccination It was discussed with the patient if she would like to receive her COVID-19 vaccine while admitted to the hospital. The patient refused the vaccine as she recently recovered from [place]. She does endorse wanting to receive the vaccine once she recovers from her hip fracture. Encouraged patient to follow up with primary care provider.   Discharge Vitals:   BP 124/82 (BP Location: Left Arm)   Pulse 85   Temp 97.7 F (36.5 C) (Oral)   Resp 18   Ht 5\' 2"  (1.575 m)   Wt 85.7 kg   SpO2 100%   BMI 34.57 kg/m   Pertinent Labs, Studies, and Procedures:  CBC Latest Ref Rng & Units 06/15/2020 06/14/2020 06/13/2020  WBC 4.0 - 10.5 K/uL 10.1 12.2(H) 12.5(H)  Hemoglobin 12.0 - 15.0 g/dL 9.8(L) 10.4(L) 12.2  Hematocrit 36 - 46 % 30.0(L) 32.2(L) 38.2  Platelets 150 - 400 K/uL 213 224 239    CMP Latest Ref Rng & Units 06/16/2020 06/15/2020 06/14/2020  Glucose 70 - 99 mg/dL 114(H) 109(H) 238(H)  BUN 8 - 23 mg/dL 31(H) 33(H) 24(H)  Creatinine 0.44 - 1.00 mg/dL 1.66(H) 1.92(H) 1.50(H)  Sodium 135 - 145 mmol/L 137 135 136  Potassium 3.5 - 5.1 mmol/L 3.7 3.9 4.5  Chloride 98 - 111 mmol/L 104 103 104  CO2 22 - 32 mmol/L 26 24 22   Calcium 8.9 - 10.3 mg/dL 7.9(L) 7.9(L) 8.3(L)  Total Protein 6.5 - 8.1 g/dL  - - -  Total Bilirubin 0.3 - 1.2 mg/dL - - -  Alkaline Phos 38 - 126 U/L - - -  AST 15 - 41 U/L - - -  ALT 0 - 44 U/L - - -    DG Chest 1 View  Result Date: 06/12/2020 CLINICAL DATA:  Low back pain recent fall with chest pain, initial encounter EXAM: CHEST  1 VIEW COMPARISON:  05/12/2020 FINDINGS: Cardiac shadow is stable. Aortic calcifications and tortuosity are seen. The lungs are clear bilaterally. No  acute bony abnormality is seen. IMPRESSION: No acute abnormality noted. Electronically Signed   By: Inez Catalina M.D.   On: 06/12/2020 22:20   DG Lumbar Spine 2-3 Views  Result Date: 06/12/2020 CLINICAL DATA:  Low back pain following fall, initial encounter EXAM: LUMBAR SPINE - 3 VIEW COMPARISON:  09/17/2018 FINDINGS: Five lumbar type vertebral bodies are well visualized. Vertebral body height is well maintained. Mild degenerative anterolisthesis of L4 on L5 is noted. No other focal abnormality is seen. Right hip replacement is noted. IMPRESSION: Degenerative anterolisthesis of L4 on L5. This is stable from the previous exam. Electronically Signed   By: Inez Catalina M.D.   On: 06/12/2020 22:17   DG Knee 1-2 Views Left  Result Date: 06/12/2020 CLINICAL DATA:  Recent fall with left knee pain, initial encounter EXAM: LEFT KNEE - 2 VIEW COMPARISON:  None. FINDINGS: Degenerative changes of the knee joint are seen. No acute fracture or dislocation is noted. IMPRESSION: Degenerative change without acute abnormality. Electronically Signed   By: Inez Catalina M.D.   On: 06/12/2020 22:22   CT Head Wo Contrast  Result Date: 06/12/2020 CLINICAL DATA:  86 year old female with head trauma. EXAM: CT HEAD WITHOUT CONTRAST CT CERVICAL SPINE WITHOUT CONTRAST TECHNIQUE: Multidetector CT imaging of the head and cervical spine was performed following the standard protocol without intravenous contrast. Multiplanar CT image reconstructions of the cervical spine were also generated. COMPARISON:  None. FINDINGS: CT  HEAD FINDINGS Brain: There is moderate age-related atrophy and chronic microvascular ischemic changes. There is no acute intracranial hemorrhage. No mass effect or midline shift. No extra-axial fluid collection. Vascular: No hyperdense vessel or unexpected calcification. Skull: Normal. Negative for fracture or focal lesion. Sinuses/Orbits: Mild mucoperiosteal thickening of paranasal sinuses with partial opacification of the right sphenoid sinus. No air-fluid level. The mastoid air cells are clear. Other: None CT CERVICAL SPINE FINDINGS Alignment: No acute subluxation. Skull base and vertebrae: No acute fracture.  Osteopenia. Soft tissues and spinal canal: No prevertebral fluid or swelling. No visible canal hematoma. Disc levels:  Multilevel degenerative changes. Upper chest: Mild emphysema. Moderate atherosclerotic calcification of the aortic arch. The aortic arch is mildly dilated measuring up to 3.8 cm in diameter. Other: Bilateral carotid bulb calcified plaques. IMPRESSION: 1. No acute intracranial pathology. Moderate age-related atrophy and chronic microvascular ischemic changes. 2. No acute/traumatic cervical spine pathology. 3. Aortic Atherosclerosis (ICD10-I70.0). Electronically Signed   By: Anner Crete M.D.   On: 06/12/2020 21:37   CT Cervical Spine Wo Contrast  Result Date: 06/12/2020 CLINICAL DATA:  86 year old female with head trauma. EXAM: CT HEAD WITHOUT CONTRAST CT CERVICAL SPINE WITHOUT CONTRAST TECHNIQUE: Multidetector CT imaging of the head and cervical spine was performed following the standard protocol without intravenous contrast. Multiplanar CT image reconstructions of the cervical spine were also generated. COMPARISON:  None. FINDINGS: CT HEAD FINDINGS Brain: There is moderate age-related atrophy and chronic microvascular ischemic changes. There is no acute intracranial hemorrhage. No mass effect or midline shift. No extra-axial fluid collection. Vascular: No hyperdense vessel or  unexpected calcification. Skull: Normal. Negative for fracture or focal lesion. Sinuses/Orbits: Mild mucoperiosteal thickening of paranasal sinuses with partial opacification of the right sphenoid sinus. No air-fluid level. The mastoid air cells are clear. Other: None CT CERVICAL SPINE FINDINGS Alignment: No acute subluxation. Skull base and vertebrae: No acute fracture.  Osteopenia. Soft tissues and spinal canal: No prevertebral fluid or swelling. No visible canal hematoma. Disc levels:  Multilevel degenerative changes. Upper chest: Mild emphysema. Moderate  atherosclerotic calcification of the aortic arch. The aortic arch is mildly dilated measuring up to 3.8 cm in diameter. Other: Bilateral carotid bulb calcified plaques. IMPRESSION: 1. No acute intracranial pathology. Moderate age-related atrophy and chronic microvascular ischemic changes. 2. No acute/traumatic cervical spine pathology. 3. Aortic Atherosclerosis (ICD10-I70.0). Electronically Signed   By: Anner Crete M.D.   On: 06/12/2020 21:37   DG C-Arm 1-60 Min  Result Date: 06/13/2020 CLINICAL DATA:  ORIF left femur fracture EXAM: LEFT FEMUR 2 VIEWS; DG C-ARM 1-60 MIN COMPARISON:  06/12/2020 FINDINGS: Intertrochanteric fracture on the left has been fixed with a compression screw and locking intramedullary rod extending to the distal femur. Fracture in satisfactory alignment. IMPRESSION: Satisfactory ORIF left intertrochanteric fracture. Electronically Signed   By: Franchot Gallo M.D.   On: 06/13/2020 17:23   DG Hip Unilat W or Wo Pelvis 2-3 Views Left  Result Date: 06/12/2020 CLINICAL DATA:  Recent fall with left hip pain, initial encounter EXAM: DG HIP (WITH OR WITHOUT PELVIS) 3V LEFT COMPARISON:  None. FINDINGS: Pelvic ring is intact. There is a comminuted intratrochanteric fracture of the proximal left femur with impaction and angulation at the fracture site. Prior right hip replacement is seen. IMPRESSION: Comminuted intratrochanteric left  femoral fracture. Electronically Signed   By: Inez Catalina M.D.   On: 06/12/2020 22:21   DG FEMUR MIN 2 VIEWS LEFT  Result Date: 06/13/2020 CLINICAL DATA:  ORIF left femur fracture EXAM: LEFT FEMUR 2 VIEWS; DG C-ARM 1-60 MIN COMPARISON:  06/12/2020 FINDINGS: Intertrochanteric fracture on the left has been fixed with a compression screw and locking intramedullary rod extending to the distal femur. Fracture in satisfactory alignment. IMPRESSION: Satisfactory ORIF left intertrochanteric fracture. Electronically Signed   By: Franchot Gallo M.D.   On: 06/13/2020 17:23     Discharge Instructions: Discharge Instructions    AMB Referral to [place] Management   Complete by: As directed    Post hospital referral: Please assign to [facility] Coordinator for complex care, PCP does TOC; patient recommended for skilled nursing but home with [facility], to daughter's home,  and disease management follow up calls and assess for further needs.  Questions please call:   Natividad Brood, RN BSN [facility] Liaison  [phone] business mobile phone Toll free office [phone]  Fax number: [phone] [email or web address]   Reason for consult: Post hospital follow up support   Diagnoses of: Other   Other Diagnosis: Femur Fx from Fall; Maximal assist returning home   Expected date of contact: 1-3 days (reserved for hospital discharges)   Call MD for:  difficulty breathing, headache or visual disturbances   Complete by: As directed    Call MD for:  extreme fatigue   Complete by: As directed    Call MD for:  persistant dizziness or light-headedness   Complete by: As directed    Call MD for:  persistant nausea and vomiting   Complete by: As directed    Call MD for:  redness, tenderness, or signs of infection (pain, swelling, redness, odor or green/yellow discharge around incision site)   Complete by: As directed    Call MD for:  severe  uncontrolled pain   Complete by: As directed    Call MD for:  temperature >100.4   Complete by: As directed    Diet - low sodium heart healthy   Complete by: As directed    Diet Carb Modified   Complete by: As directed    Increase  activity slowly   Complete by: As directed    Leave dressing on - Keep it clean, dry, and intact until clinic visit   Complete by: As directed    Partial weight bearing   Complete by: As directed    % Body Weight: 50   Laterality: left   Extremity: Lower     Signed: Riesa Pope, MD 06/17/2020, 6:31 AM   Pager: [phone]

## 2020-07-01 ENCOUNTER — Other Ambulatory Visit: Payer: Self-pay | Admitting: *Deleted

## 2020-07-01 DIAGNOSIS — I130 Hypertensive heart and chronic kidney disease with heart failure and stage 1 through stage 4 chronic kidney disease, or unspecified chronic kidney disease: Secondary | ICD-10-CM | POA: Diagnosis not present

## 2020-07-01 DIAGNOSIS — S72142D Displaced intertrochanteric fracture of left femur, subsequent encounter for closed fracture with routine healing: Secondary | ICD-10-CM | POA: Diagnosis not present

## 2020-07-01 DIAGNOSIS — I5032 Chronic diastolic (congestive) heart failure: Secondary | ICD-10-CM | POA: Diagnosis not present

## 2020-07-01 DIAGNOSIS — W19XXXD Unspecified fall, subsequent encounter: Secondary | ICD-10-CM | POA: Diagnosis not present

## 2020-07-01 DIAGNOSIS — N183 Chronic kidney disease, stage 3 unspecified: Secondary | ICD-10-CM | POA: Diagnosis not present

## 2020-07-01 DIAGNOSIS — E1122 Type 2 diabetes mellitus with diabetic chronic kidney disease: Secondary | ICD-10-CM | POA: Diagnosis not present

## 2020-07-01 NOTE — Patient Outreach (Addendum)
[place] [facility]) Care Management  07/01/2020  [name] 1929-02-21 [id number]   Telephone Assessment-Unsuccessful  Outreach #4 RN attempted outreach call however unsuccessful. RN able to leave a HIPAA approved voice message requesting a call back.  Will scheduled one additional outreach for pending [facility] services next month per workflow.  Raina Mina, RN Care Management Coordinator [place] Office [phone]

## 2020-07-04 DIAGNOSIS — I5032 Chronic diastolic (congestive) heart failure: Secondary | ICD-10-CM | POA: Diagnosis not present

## 2020-07-04 DIAGNOSIS — I130 Hypertensive heart and chronic kidney disease with heart failure and stage 1 through stage 4 chronic kidney disease, or unspecified chronic kidney disease: Secondary | ICD-10-CM | POA: Diagnosis not present

## 2020-07-04 DIAGNOSIS — S72142A Displaced intertrochanteric fracture of left femur, initial encounter for closed fracture: Secondary | ICD-10-CM | POA: Diagnosis not present

## 2020-07-04 DIAGNOSIS — S72142D Displaced intertrochanteric fracture of left femur, subsequent encounter for closed fracture with routine healing: Secondary | ICD-10-CM | POA: Diagnosis not present

## 2020-07-04 DIAGNOSIS — E1122 Type 2 diabetes mellitus with diabetic chronic kidney disease: Secondary | ICD-10-CM | POA: Diagnosis not present

## 2020-07-04 DIAGNOSIS — N183 Chronic kidney disease, stage 3 unspecified: Secondary | ICD-10-CM | POA: Diagnosis not present

## 2020-07-04 DIAGNOSIS — Z4789 Encounter for other orthopedic aftercare: Secondary | ICD-10-CM | POA: Diagnosis not present

## 2020-07-04 DIAGNOSIS — W19XXXD Unspecified fall, subsequent encounter: Secondary | ICD-10-CM | POA: Diagnosis not present

## 2020-07-06 DIAGNOSIS — E1122 Type 2 diabetes mellitus with diabetic chronic kidney disease: Secondary | ICD-10-CM | POA: Diagnosis not present

## 2020-07-06 DIAGNOSIS — N183 Chronic kidney disease, stage 3 unspecified: Secondary | ICD-10-CM | POA: Diagnosis not present

## 2020-07-06 DIAGNOSIS — W19XXXD Unspecified fall, subsequent encounter: Secondary | ICD-10-CM | POA: Diagnosis not present

## 2020-07-06 DIAGNOSIS — I130 Hypertensive heart and chronic kidney disease with heart failure and stage 1 through stage 4 chronic kidney disease, or unspecified chronic kidney disease: Secondary | ICD-10-CM | POA: Diagnosis not present

## 2020-07-06 DIAGNOSIS — S72142D Displaced intertrochanteric fracture of left femur, subsequent encounter for closed fracture with routine healing: Secondary | ICD-10-CM | POA: Diagnosis not present

## 2020-07-06 DIAGNOSIS — I5032 Chronic diastolic (congestive) heart failure: Secondary | ICD-10-CM | POA: Diagnosis not present

## 2020-07-07 DIAGNOSIS — I130 Hypertensive heart and chronic kidney disease with heart failure and stage 1 through stage 4 chronic kidney disease, or unspecified chronic kidney disease: Secondary | ICD-10-CM | POA: Diagnosis not present

## 2020-07-07 DIAGNOSIS — E1122 Type 2 diabetes mellitus with diabetic chronic kidney disease: Secondary | ICD-10-CM | POA: Diagnosis not present

## 2020-07-07 DIAGNOSIS — S72142D Displaced intertrochanteric fracture of left femur, subsequent encounter for closed fracture with routine healing: Secondary | ICD-10-CM | POA: Diagnosis not present

## 2020-07-07 DIAGNOSIS — N183 Chronic kidney disease, stage 3 unspecified: Secondary | ICD-10-CM | POA: Diagnosis not present

## 2020-07-07 DIAGNOSIS — W19XXXD Unspecified fall, subsequent encounter: Secondary | ICD-10-CM | POA: Diagnosis not present

## 2020-07-07 DIAGNOSIS — I5032 Chronic diastolic (congestive) heart failure: Secondary | ICD-10-CM | POA: Diagnosis not present

## 2020-07-08 ENCOUNTER — Ambulatory Visit: Payer: Medicare Other | Admitting: Cardiology

## 2020-07-08 DIAGNOSIS — S72142D Displaced intertrochanteric fracture of left femur, subsequent encounter for closed fracture with routine healing: Secondary | ICD-10-CM | POA: Diagnosis not present

## 2020-07-08 DIAGNOSIS — I5032 Chronic diastolic (congestive) heart failure: Secondary | ICD-10-CM | POA: Diagnosis not present

## 2020-07-08 DIAGNOSIS — E1122 Type 2 diabetes mellitus with diabetic chronic kidney disease: Secondary | ICD-10-CM | POA: Diagnosis not present

## 2020-07-08 DIAGNOSIS — N183 Chronic kidney disease, stage 3 unspecified: Secondary | ICD-10-CM | POA: Diagnosis not present

## 2020-07-08 DIAGNOSIS — I130 Hypertensive heart and chronic kidney disease with heart failure and stage 1 through stage 4 chronic kidney disease, or unspecified chronic kidney disease: Secondary | ICD-10-CM | POA: Diagnosis not present

## 2020-07-08 DIAGNOSIS — W19XXXD Unspecified fall, subsequent encounter: Secondary | ICD-10-CM | POA: Diagnosis not present

## 2020-07-14 DIAGNOSIS — I5032 Chronic diastolic (congestive) heart failure: Secondary | ICD-10-CM | POA: Diagnosis not present

## 2020-07-14 DIAGNOSIS — N183 Chronic kidney disease, stage 3 unspecified: Secondary | ICD-10-CM | POA: Diagnosis not present

## 2020-07-14 DIAGNOSIS — E1122 Type 2 diabetes mellitus with diabetic chronic kidney disease: Secondary | ICD-10-CM | POA: Diagnosis not present

## 2020-07-14 DIAGNOSIS — S72142D Displaced intertrochanteric fracture of left femur, subsequent encounter for closed fracture with routine healing: Secondary | ICD-10-CM | POA: Diagnosis not present

## 2020-07-14 DIAGNOSIS — I130 Hypertensive heart and chronic kidney disease with heart failure and stage 1 through stage 4 chronic kidney disease, or unspecified chronic kidney disease: Secondary | ICD-10-CM | POA: Diagnosis not present

## 2020-07-14 DIAGNOSIS — W19XXXD Unspecified fall, subsequent encounter: Secondary | ICD-10-CM | POA: Diagnosis not present

## 2020-07-15 DIAGNOSIS — I130 Hypertensive heart and chronic kidney disease with heart failure and stage 1 through stage 4 chronic kidney disease, or unspecified chronic kidney disease: Secondary | ICD-10-CM | POA: Diagnosis not present

## 2020-07-15 DIAGNOSIS — N183 Chronic kidney disease, stage 3 unspecified: Secondary | ICD-10-CM | POA: Diagnosis not present

## 2020-07-15 DIAGNOSIS — W19XXXD Unspecified fall, subsequent encounter: Secondary | ICD-10-CM | POA: Diagnosis not present

## 2020-07-15 DIAGNOSIS — I5032 Chronic diastolic (congestive) heart failure: Secondary | ICD-10-CM | POA: Diagnosis not present

## 2020-07-15 DIAGNOSIS — S72142D Displaced intertrochanteric fracture of left femur, subsequent encounter for closed fracture with routine healing: Secondary | ICD-10-CM | POA: Diagnosis not present

## 2020-07-15 DIAGNOSIS — E1122 Type 2 diabetes mellitus with diabetic chronic kidney disease: Secondary | ICD-10-CM | POA: Diagnosis not present

## 2020-07-18 DIAGNOSIS — Z7984 Long term (current) use of oral hypoglycemic drugs: Secondary | ICD-10-CM | POA: Diagnosis not present

## 2020-07-18 DIAGNOSIS — F410 Panic disorder [episodic paroxysmal anxiety] without agoraphobia: Secondary | ICD-10-CM | POA: Diagnosis not present

## 2020-07-18 DIAGNOSIS — E1142 Type 2 diabetes mellitus with diabetic polyneuropathy: Secondary | ICD-10-CM | POA: Diagnosis not present

## 2020-07-18 DIAGNOSIS — E1165 Type 2 diabetes mellitus with hyperglycemia: Secondary | ICD-10-CM | POA: Diagnosis not present

## 2020-07-18 DIAGNOSIS — Z7401 Bed confinement status: Secondary | ICD-10-CM | POA: Diagnosis not present

## 2020-07-18 DIAGNOSIS — Z9181 History of falling: Secondary | ICD-10-CM | POA: Diagnosis not present

## 2020-07-18 DIAGNOSIS — I482 Chronic atrial fibrillation, unspecified: Secondary | ICD-10-CM | POA: Diagnosis not present

## 2020-07-18 DIAGNOSIS — K219 Gastro-esophageal reflux disease without esophagitis: Secondary | ICD-10-CM | POA: Diagnosis not present

## 2020-07-18 DIAGNOSIS — N183 Chronic kidney disease, stage 3 unspecified: Secondary | ICD-10-CM | POA: Diagnosis not present

## 2020-07-18 DIAGNOSIS — H409 Unspecified glaucoma: Secondary | ICD-10-CM | POA: Diagnosis not present

## 2020-07-18 DIAGNOSIS — F32A Depression, unspecified: Secondary | ICD-10-CM | POA: Diagnosis not present

## 2020-07-18 DIAGNOSIS — I5032 Chronic diastolic (congestive) heart failure: Secondary | ICD-10-CM | POA: Diagnosis not present

## 2020-07-18 DIAGNOSIS — I130 Hypertensive heart and chronic kidney disease with heart failure and stage 1 through stage 4 chronic kidney disease, or unspecified chronic kidney disease: Secondary | ICD-10-CM | POA: Diagnosis not present

## 2020-07-18 DIAGNOSIS — E1122 Type 2 diabetes mellitus with diabetic chronic kidney disease: Secondary | ICD-10-CM | POA: Diagnosis not present

## 2020-07-18 DIAGNOSIS — Z794 Long term (current) use of insulin: Secondary | ICD-10-CM | POA: Diagnosis not present

## 2020-07-18 DIAGNOSIS — E785 Hyperlipidemia, unspecified: Secondary | ICD-10-CM | POA: Diagnosis not present

## 2020-07-18 DIAGNOSIS — H548 Legal blindness, as defined in USA: Secondary | ICD-10-CM | POA: Diagnosis not present

## 2020-07-18 DIAGNOSIS — Z7901 Long term (current) use of anticoagulants: Secondary | ICD-10-CM | POA: Diagnosis not present

## 2020-07-18 DIAGNOSIS — S72142D Displaced intertrochanteric fracture of left femur, subsequent encounter for closed fracture with routine healing: Secondary | ICD-10-CM | POA: Diagnosis not present

## 2020-07-18 DIAGNOSIS — J449 Chronic obstructive pulmonary disease, unspecified: Secondary | ICD-10-CM | POA: Diagnosis not present

## 2020-07-18 DIAGNOSIS — W19XXXD Unspecified fall, subsequent encounter: Secondary | ICD-10-CM | POA: Diagnosis not present

## 2020-07-18 DIAGNOSIS — E559 Vitamin D deficiency, unspecified: Secondary | ICD-10-CM | POA: Diagnosis not present

## 2020-07-18 DIAGNOSIS — Z79891 Long term (current) use of opiate analgesic: Secondary | ICD-10-CM | POA: Diagnosis not present

## 2020-07-19 DIAGNOSIS — I130 Hypertensive heart and chronic kidney disease with heart failure and stage 1 through stage 4 chronic kidney disease, or unspecified chronic kidney disease: Secondary | ICD-10-CM | POA: Diagnosis not present

## 2020-07-19 DIAGNOSIS — W19XXXD Unspecified fall, subsequent encounter: Secondary | ICD-10-CM | POA: Diagnosis not present

## 2020-07-19 DIAGNOSIS — N183 Chronic kidney disease, stage 3 unspecified: Secondary | ICD-10-CM | POA: Diagnosis not present

## 2020-07-19 DIAGNOSIS — S72142D Displaced intertrochanteric fracture of left femur, subsequent encounter for closed fracture with routine healing: Secondary | ICD-10-CM | POA: Diagnosis not present

## 2020-07-19 DIAGNOSIS — E1122 Type 2 diabetes mellitus with diabetic chronic kidney disease: Secondary | ICD-10-CM | POA: Diagnosis not present

## 2020-07-19 DIAGNOSIS — I5032 Chronic diastolic (congestive) heart failure: Secondary | ICD-10-CM | POA: Diagnosis not present

## 2020-07-20 DIAGNOSIS — S72142D Displaced intertrochanteric fracture of left femur, subsequent encounter for closed fracture with routine healing: Secondary | ICD-10-CM | POA: Diagnosis not present

## 2020-07-20 DIAGNOSIS — W19XXXD Unspecified fall, subsequent encounter: Secondary | ICD-10-CM | POA: Diagnosis not present

## 2020-07-20 DIAGNOSIS — N183 Chronic kidney disease, stage 3 unspecified: Secondary | ICD-10-CM | POA: Diagnosis not present

## 2020-07-20 DIAGNOSIS — E1122 Type 2 diabetes mellitus with diabetic chronic kidney disease: Secondary | ICD-10-CM | POA: Diagnosis not present

## 2020-07-20 DIAGNOSIS — Z23 Encounter for immunization: Secondary | ICD-10-CM | POA: Diagnosis not present

## 2020-07-20 DIAGNOSIS — I5032 Chronic diastolic (congestive) heart failure: Secondary | ICD-10-CM | POA: Diagnosis not present

## 2020-07-20 DIAGNOSIS — S81802A Unspecified open wound, left lower leg, initial encounter: Secondary | ICD-10-CM | POA: Diagnosis not present

## 2020-07-20 DIAGNOSIS — I130 Hypertensive heart and chronic kidney disease with heart failure and stage 1 through stage 4 chronic kidney disease, or unspecified chronic kidney disease: Secondary | ICD-10-CM | POA: Diagnosis not present

## 2020-07-22 DIAGNOSIS — S72142D Displaced intertrochanteric fracture of left femur, subsequent encounter for closed fracture with routine healing: Secondary | ICD-10-CM | POA: Diagnosis not present

## 2020-07-22 DIAGNOSIS — N183 Chronic kidney disease, stage 3 unspecified: Secondary | ICD-10-CM | POA: Diagnosis not present

## 2020-07-22 DIAGNOSIS — W19XXXD Unspecified fall, subsequent encounter: Secondary | ICD-10-CM | POA: Diagnosis not present

## 2020-07-22 DIAGNOSIS — E1122 Type 2 diabetes mellitus with diabetic chronic kidney disease: Secondary | ICD-10-CM | POA: Diagnosis not present

## 2020-07-22 DIAGNOSIS — I5032 Chronic diastolic (congestive) heart failure: Secondary | ICD-10-CM | POA: Diagnosis not present

## 2020-07-22 DIAGNOSIS — I130 Hypertensive heart and chronic kidney disease with heart failure and stage 1 through stage 4 chronic kidney disease, or unspecified chronic kidney disease: Secondary | ICD-10-CM | POA: Diagnosis not present

## 2020-07-25 DIAGNOSIS — I5032 Chronic diastolic (congestive) heart failure: Secondary | ICD-10-CM | POA: Diagnosis not present

## 2020-07-25 DIAGNOSIS — I130 Hypertensive heart and chronic kidney disease with heart failure and stage 1 through stage 4 chronic kidney disease, or unspecified chronic kidney disease: Secondary | ICD-10-CM | POA: Diagnosis not present

## 2020-07-25 DIAGNOSIS — N183 Chronic kidney disease, stage 3 unspecified: Secondary | ICD-10-CM | POA: Diagnosis not present

## 2020-07-25 DIAGNOSIS — W19XXXD Unspecified fall, subsequent encounter: Secondary | ICD-10-CM | POA: Diagnosis not present

## 2020-07-25 DIAGNOSIS — E1122 Type 2 diabetes mellitus with diabetic chronic kidney disease: Secondary | ICD-10-CM | POA: Diagnosis not present

## 2020-07-25 DIAGNOSIS — S72142D Displaced intertrochanteric fracture of left femur, subsequent encounter for closed fracture with routine healing: Secondary | ICD-10-CM | POA: Diagnosis not present

## 2020-07-26 DIAGNOSIS — N183 Chronic kidney disease, stage 3 unspecified: Secondary | ICD-10-CM | POA: Diagnosis not present

## 2020-07-26 DIAGNOSIS — W19XXXD Unspecified fall, subsequent encounter: Secondary | ICD-10-CM | POA: Diagnosis not present

## 2020-07-26 DIAGNOSIS — I5032 Chronic diastolic (congestive) heart failure: Secondary | ICD-10-CM | POA: Diagnosis not present

## 2020-07-26 DIAGNOSIS — I130 Hypertensive heart and chronic kidney disease with heart failure and stage 1 through stage 4 chronic kidney disease, or unspecified chronic kidney disease: Secondary | ICD-10-CM | POA: Diagnosis not present

## 2020-07-26 DIAGNOSIS — E1122 Type 2 diabetes mellitus with diabetic chronic kidney disease: Secondary | ICD-10-CM | POA: Diagnosis not present

## 2020-07-26 DIAGNOSIS — S72142D Displaced intertrochanteric fracture of left femur, subsequent encounter for closed fracture with routine healing: Secondary | ICD-10-CM | POA: Diagnosis not present

## 2020-07-28 DIAGNOSIS — I130 Hypertensive heart and chronic kidney disease with heart failure and stage 1 through stage 4 chronic kidney disease, or unspecified chronic kidney disease: Secondary | ICD-10-CM | POA: Diagnosis not present

## 2020-07-28 DIAGNOSIS — W19XXXD Unspecified fall, subsequent encounter: Secondary | ICD-10-CM | POA: Diagnosis not present

## 2020-07-28 DIAGNOSIS — N183 Chronic kidney disease, stage 3 unspecified: Secondary | ICD-10-CM | POA: Diagnosis not present

## 2020-07-28 DIAGNOSIS — E1122 Type 2 diabetes mellitus with diabetic chronic kidney disease: Secondary | ICD-10-CM | POA: Diagnosis not present

## 2020-07-28 DIAGNOSIS — I5032 Chronic diastolic (congestive) heart failure: Secondary | ICD-10-CM | POA: Diagnosis not present

## 2020-07-28 DIAGNOSIS — S72142D Displaced intertrochanteric fracture of left femur, subsequent encounter for closed fracture with routine healing: Secondary | ICD-10-CM | POA: Diagnosis not present

## 2020-07-29 DIAGNOSIS — E1142 Type 2 diabetes mellitus with diabetic polyneuropathy: Secondary | ICD-10-CM | POA: Diagnosis not present

## 2020-07-29 DIAGNOSIS — J449 Chronic obstructive pulmonary disease, unspecified: Secondary | ICD-10-CM | POA: Diagnosis not present

## 2020-07-29 DIAGNOSIS — I5032 Chronic diastolic (congestive) heart failure: Secondary | ICD-10-CM | POA: Diagnosis not present

## 2020-07-29 DIAGNOSIS — E1122 Type 2 diabetes mellitus with diabetic chronic kidney disease: Secondary | ICD-10-CM | POA: Diagnosis not present

## 2020-07-29 DIAGNOSIS — I482 Chronic atrial fibrillation, unspecified: Secondary | ICD-10-CM | POA: Diagnosis not present

## 2020-07-29 DIAGNOSIS — N390 Urinary tract infection, site not specified: Secondary | ICD-10-CM | POA: Diagnosis not present

## 2020-07-29 DIAGNOSIS — H548 Legal blindness, as defined in USA: Secondary | ICD-10-CM | POA: Diagnosis not present

## 2020-07-29 DIAGNOSIS — E1165 Type 2 diabetes mellitus with hyperglycemia: Secondary | ICD-10-CM | POA: Diagnosis not present

## 2020-07-29 DIAGNOSIS — N183 Chronic kidney disease, stage 3 unspecified: Secondary | ICD-10-CM | POA: Diagnosis not present

## 2020-07-29 DIAGNOSIS — I130 Hypertensive heart and chronic kidney disease with heart failure and stage 1 through stage 4 chronic kidney disease, or unspecified chronic kidney disease: Secondary | ICD-10-CM | POA: Diagnosis not present

## 2020-07-29 DIAGNOSIS — S72142D Displaced intertrochanteric fracture of left femur, subsequent encounter for closed fracture with routine healing: Secondary | ICD-10-CM | POA: Diagnosis not present

## 2020-07-29 DIAGNOSIS — R300 Dysuria: Secondary | ICD-10-CM | POA: Diagnosis not present

## 2020-07-29 DIAGNOSIS — I4891 Unspecified atrial fibrillation: Secondary | ICD-10-CM | POA: Diagnosis not present

## 2020-07-29 DIAGNOSIS — W19XXXD Unspecified fall, subsequent encounter: Secondary | ICD-10-CM | POA: Diagnosis not present

## 2020-08-01 ENCOUNTER — Other Ambulatory Visit: Payer: Self-pay | Admitting: *Deleted

## 2020-08-01 DIAGNOSIS — E1122 Type 2 diabetes mellitus with diabetic chronic kidney disease: Secondary | ICD-10-CM | POA: Diagnosis not present

## 2020-08-01 DIAGNOSIS — I130 Hypertensive heart and chronic kidney disease with heart failure and stage 1 through stage 4 chronic kidney disease, or unspecified chronic kidney disease: Secondary | ICD-10-CM | POA: Diagnosis not present

## 2020-08-01 DIAGNOSIS — I5032 Chronic diastolic (congestive) heart failure: Secondary | ICD-10-CM | POA: Diagnosis not present

## 2020-08-01 DIAGNOSIS — S72142D Displaced intertrochanteric fracture of left femur, subsequent encounter for closed fracture with routine healing: Secondary | ICD-10-CM | POA: Diagnosis not present

## 2020-08-01 DIAGNOSIS — W19XXXD Unspecified fall, subsequent encounter: Secondary | ICD-10-CM | POA: Diagnosis not present

## 2020-08-01 DIAGNOSIS — N183 Chronic kidney disease, stage 3 unspecified: Secondary | ICD-10-CM | POA: Diagnosis not present

## 2020-08-01 NOTE — Patient Outreach (Signed)
[place] [facility]) Care Management  08/01/2020  MYRENE BOUGHER 04/17/1929 [id number]   Case Closure  Several unsuccessful attempts to reach pt and her daughter [name]). No return calls to HIPAA message or outreach letter sent to pt. Will closed case at this time due to unsuccessful contacts.  Will update the pt's provider of her disposition with [facility] services.  Raina Mina, RN Care Management Coordinator [place] Office [phone]

## 2020-08-02 DIAGNOSIS — S72142D Displaced intertrochanteric fracture of left femur, subsequent encounter for closed fracture with routine healing: Secondary | ICD-10-CM | POA: Diagnosis not present

## 2020-08-02 DIAGNOSIS — N183 Chronic kidney disease, stage 3 unspecified: Secondary | ICD-10-CM | POA: Diagnosis not present

## 2020-08-02 DIAGNOSIS — E1122 Type 2 diabetes mellitus with diabetic chronic kidney disease: Secondary | ICD-10-CM | POA: Diagnosis not present

## 2020-08-02 DIAGNOSIS — I130 Hypertensive heart and chronic kidney disease with heart failure and stage 1 through stage 4 chronic kidney disease, or unspecified chronic kidney disease: Secondary | ICD-10-CM | POA: Diagnosis not present

## 2020-08-02 DIAGNOSIS — W19XXXD Unspecified fall, subsequent encounter: Secondary | ICD-10-CM | POA: Diagnosis not present

## 2020-08-02 DIAGNOSIS — I5032 Chronic diastolic (congestive) heart failure: Secondary | ICD-10-CM | POA: Diagnosis not present

## 2020-08-03 DIAGNOSIS — S72142A Displaced intertrochanteric fracture of left femur, initial encounter for closed fracture: Secondary | ICD-10-CM | POA: Diagnosis not present

## 2020-08-03 DIAGNOSIS — Z4789 Encounter for other orthopedic aftercare: Secondary | ICD-10-CM | POA: Diagnosis not present

## 2020-08-04 DIAGNOSIS — E1122 Type 2 diabetes mellitus with diabetic chronic kidney disease: Secondary | ICD-10-CM | POA: Diagnosis not present

## 2020-08-04 DIAGNOSIS — I5032 Chronic diastolic (congestive) heart failure: Secondary | ICD-10-CM | POA: Diagnosis not present

## 2020-08-04 DIAGNOSIS — W19XXXD Unspecified fall, subsequent encounter: Secondary | ICD-10-CM | POA: Diagnosis not present

## 2020-08-04 DIAGNOSIS — N183 Chronic kidney disease, stage 3 unspecified: Secondary | ICD-10-CM | POA: Diagnosis not present

## 2020-08-04 DIAGNOSIS — I130 Hypertensive heart and chronic kidney disease with heart failure and stage 1 through stage 4 chronic kidney disease, or unspecified chronic kidney disease: Secondary | ICD-10-CM | POA: Diagnosis not present

## 2020-08-04 DIAGNOSIS — S72142D Displaced intertrochanteric fracture of left femur, subsequent encounter for closed fracture with routine healing: Secondary | ICD-10-CM | POA: Diagnosis not present

## 2020-08-05 DIAGNOSIS — I130 Hypertensive heart and chronic kidney disease with heart failure and stage 1 through stage 4 chronic kidney disease, or unspecified chronic kidney disease: Secondary | ICD-10-CM | POA: Diagnosis not present

## 2020-08-05 DIAGNOSIS — I5032 Chronic diastolic (congestive) heart failure: Secondary | ICD-10-CM | POA: Diagnosis not present

## 2020-08-05 DIAGNOSIS — S72142D Displaced intertrochanteric fracture of left femur, subsequent encounter for closed fracture with routine healing: Secondary | ICD-10-CM | POA: Diagnosis not present

## 2020-08-05 DIAGNOSIS — W19XXXD Unspecified fall, subsequent encounter: Secondary | ICD-10-CM | POA: Diagnosis not present

## 2020-08-05 DIAGNOSIS — N183 Chronic kidney disease, stage 3 unspecified: Secondary | ICD-10-CM | POA: Diagnosis not present

## 2020-08-05 DIAGNOSIS — E1122 Type 2 diabetes mellitus with diabetic chronic kidney disease: Secondary | ICD-10-CM | POA: Diagnosis not present

## 2020-08-08 DIAGNOSIS — K641 Second degree hemorrhoids: Secondary | ICD-10-CM | POA: Diagnosis not present

## 2020-08-08 DIAGNOSIS — Z723 Lack of physical exercise: Secondary | ICD-10-CM | POA: Diagnosis not present

## 2020-08-08 DIAGNOSIS — S72002D Fracture of unspecified part of neck of left femur, subsequent encounter for closed fracture with routine healing: Secondary | ICD-10-CM | POA: Diagnosis not present

## 2020-08-09 DIAGNOSIS — E1122 Type 2 diabetes mellitus with diabetic chronic kidney disease: Secondary | ICD-10-CM | POA: Diagnosis not present

## 2020-08-09 DIAGNOSIS — I130 Hypertensive heart and chronic kidney disease with heart failure and stage 1 through stage 4 chronic kidney disease, or unspecified chronic kidney disease: Secondary | ICD-10-CM | POA: Diagnosis not present

## 2020-08-09 DIAGNOSIS — W19XXXD Unspecified fall, subsequent encounter: Secondary | ICD-10-CM | POA: Diagnosis not present

## 2020-08-09 DIAGNOSIS — I5032 Chronic diastolic (congestive) heart failure: Secondary | ICD-10-CM | POA: Diagnosis not present

## 2020-08-09 DIAGNOSIS — N183 Chronic kidney disease, stage 3 unspecified: Secondary | ICD-10-CM | POA: Diagnosis not present

## 2020-08-09 DIAGNOSIS — S72142D Displaced intertrochanteric fracture of left femur, subsequent encounter for closed fracture with routine healing: Secondary | ICD-10-CM | POA: Diagnosis not present

## 2020-08-11 DIAGNOSIS — S72142D Displaced intertrochanteric fracture of left femur, subsequent encounter for closed fracture with routine healing: Secondary | ICD-10-CM | POA: Diagnosis not present

## 2020-08-11 DIAGNOSIS — E1122 Type 2 diabetes mellitus with diabetic chronic kidney disease: Secondary | ICD-10-CM | POA: Diagnosis not present

## 2020-08-11 DIAGNOSIS — I5032 Chronic diastolic (congestive) heart failure: Secondary | ICD-10-CM | POA: Diagnosis not present

## 2020-08-11 DIAGNOSIS — W19XXXD Unspecified fall, subsequent encounter: Secondary | ICD-10-CM | POA: Diagnosis not present

## 2020-08-11 DIAGNOSIS — N183 Chronic kidney disease, stage 3 unspecified: Secondary | ICD-10-CM | POA: Diagnosis not present

## 2020-08-11 DIAGNOSIS — I130 Hypertensive heart and chronic kidney disease with heart failure and stage 1 through stage 4 chronic kidney disease, or unspecified chronic kidney disease: Secondary | ICD-10-CM | POA: Diagnosis not present

## 2020-08-15 ENCOUNTER — Telehealth: Payer: Self-pay | Admitting: Orthopedic Surgery

## 2020-08-15 DIAGNOSIS — E1122 Type 2 diabetes mellitus with diabetic chronic kidney disease: Secondary | ICD-10-CM | POA: Diagnosis not present

## 2020-08-15 DIAGNOSIS — S72142D Displaced intertrochanteric fracture of left femur, subsequent encounter for closed fracture with routine healing: Secondary | ICD-10-CM | POA: Diagnosis not present

## 2020-08-15 DIAGNOSIS — W19XXXD Unspecified fall, subsequent encounter: Secondary | ICD-10-CM | POA: Diagnosis not present

## 2020-08-15 DIAGNOSIS — N183 Chronic kidney disease, stage 3 unspecified: Secondary | ICD-10-CM | POA: Diagnosis not present

## 2020-08-15 DIAGNOSIS — I5032 Chronic diastolic (congestive) heart failure: Secondary | ICD-10-CM | POA: Diagnosis not present

## 2020-08-15 DIAGNOSIS — I130 Hypertensive heart and chronic kidney disease with heart failure and stage 1 through stage 4 chronic kidney disease, or unspecified chronic kidney disease: Secondary | ICD-10-CM | POA: Diagnosis not present

## 2020-08-15 NOTE — Telephone Encounter (Signed)
Reached patient; scheduled in slot for this Wednesday, 08/17/20 (in cancellation slot); patient and daughter aware.

## 2020-08-15 NOTE — Telephone Encounter (Signed)
Call received from [place] from [facility]; relays that she is seeing patient at her home, and that patient relays she 'can barely step down onto her right foot.'  Steffanie Dunn is asking for an order from Dr Aline Brochure for a right foot Xray.  Patient's chart notes indicate that patient is status/post surgery by Dr Alvan Dame for left hip.  Patient does have an appointment scheduled with Dr Aline Brochure, regularly scheduled 6 month follow up visit for foot pain.  Please review and advise.  Ph# [phone] for Kristi's direct number.

## 2020-08-15 NOTE — Telephone Encounter (Signed)
Can not place order for xray in home, would need to be here, can you call with appointment for foot pain increased?   I called Kristi to let her know

## 2020-08-16 DIAGNOSIS — W19XXXD Unspecified fall, subsequent encounter: Secondary | ICD-10-CM | POA: Diagnosis not present

## 2020-08-16 DIAGNOSIS — I5032 Chronic diastolic (congestive) heart failure: Secondary | ICD-10-CM | POA: Diagnosis not present

## 2020-08-16 DIAGNOSIS — N183 Chronic kidney disease, stage 3 unspecified: Secondary | ICD-10-CM | POA: Diagnosis not present

## 2020-08-16 DIAGNOSIS — I130 Hypertensive heart and chronic kidney disease with heart failure and stage 1 through stage 4 chronic kidney disease, or unspecified chronic kidney disease: Secondary | ICD-10-CM | POA: Diagnosis not present

## 2020-08-16 DIAGNOSIS — E1122 Type 2 diabetes mellitus with diabetic chronic kidney disease: Secondary | ICD-10-CM | POA: Diagnosis not present

## 2020-08-16 DIAGNOSIS — S72142D Displaced intertrochanteric fracture of left femur, subsequent encounter for closed fracture with routine healing: Secondary | ICD-10-CM | POA: Diagnosis not present

## 2020-08-17 ENCOUNTER — Ambulatory Visit (INDEPENDENT_AMBULATORY_CARE_PROVIDER_SITE_OTHER): Payer: Medicare Other | Admitting: Orthopedic Surgery

## 2020-08-17 ENCOUNTER — Ambulatory Visit: Payer: Medicare Other

## 2020-08-17 ENCOUNTER — Encounter: Payer: Self-pay | Admitting: Orthopedic Surgery

## 2020-08-17 ENCOUNTER — Other Ambulatory Visit: Payer: Self-pay | Admitting: Orthopedic Surgery

## 2020-08-17 ENCOUNTER — Other Ambulatory Visit: Payer: Self-pay

## 2020-08-17 VITALS — BP 131/90 | HR 98 | Ht 62.0 in | Wt 189.0 lb

## 2020-08-17 DIAGNOSIS — Z993 Dependence on wheelchair: Secondary | ICD-10-CM | POA: Diagnosis not present

## 2020-08-17 DIAGNOSIS — H548 Legal blindness, as defined in USA: Secondary | ICD-10-CM | POA: Diagnosis not present

## 2020-08-17 DIAGNOSIS — W19XXXD Unspecified fall, subsequent encounter: Secondary | ICD-10-CM | POA: Diagnosis not present

## 2020-08-17 DIAGNOSIS — Z7401 Bed confinement status: Secondary | ICD-10-CM | POA: Diagnosis not present

## 2020-08-17 DIAGNOSIS — H409 Unspecified glaucoma: Secondary | ICD-10-CM | POA: Diagnosis not present

## 2020-08-17 DIAGNOSIS — E1343 Other specified diabetes mellitus with diabetic autonomic (poly)neuropathy: Secondary | ICD-10-CM

## 2020-08-17 DIAGNOSIS — J449 Chronic obstructive pulmonary disease, unspecified: Secondary | ICD-10-CM | POA: Diagnosis not present

## 2020-08-17 DIAGNOSIS — Z9181 History of falling: Secondary | ICD-10-CM | POA: Diagnosis not present

## 2020-08-17 DIAGNOSIS — M79671 Pain in right foot: Secondary | ICD-10-CM | POA: Diagnosis not present

## 2020-08-17 DIAGNOSIS — E1122 Type 2 diabetes mellitus with diabetic chronic kidney disease: Secondary | ICD-10-CM | POA: Diagnosis not present

## 2020-08-17 DIAGNOSIS — Z794 Long term (current) use of insulin: Secondary | ICD-10-CM | POA: Diagnosis not present

## 2020-08-17 DIAGNOSIS — I130 Hypertensive heart and chronic kidney disease with heart failure and stage 1 through stage 4 chronic kidney disease, or unspecified chronic kidney disease: Secondary | ICD-10-CM | POA: Diagnosis not present

## 2020-08-17 DIAGNOSIS — M25572 Pain in left ankle and joints of left foot: Secondary | ICD-10-CM | POA: Diagnosis not present

## 2020-08-17 DIAGNOSIS — S81812D Laceration without foreign body, left lower leg, subsequent encounter: Secondary | ICD-10-CM | POA: Diagnosis not present

## 2020-08-17 DIAGNOSIS — Z79899 Other long term (current) drug therapy: Secondary | ICD-10-CM | POA: Diagnosis not present

## 2020-08-17 DIAGNOSIS — E1165 Type 2 diabetes mellitus with hyperglycemia: Secondary | ICD-10-CM | POA: Diagnosis not present

## 2020-08-17 DIAGNOSIS — Z7984 Long term (current) use of oral hypoglycemic drugs: Secondary | ICD-10-CM | POA: Diagnosis not present

## 2020-08-17 DIAGNOSIS — I5032 Chronic diastolic (congestive) heart failure: Secondary | ICD-10-CM | POA: Diagnosis not present

## 2020-08-17 DIAGNOSIS — Z79891 Long term (current) use of opiate analgesic: Secondary | ICD-10-CM | POA: Diagnosis not present

## 2020-08-17 DIAGNOSIS — F32A Depression, unspecified: Secondary | ICD-10-CM | POA: Diagnosis not present

## 2020-08-17 DIAGNOSIS — E559 Vitamin D deficiency, unspecified: Secondary | ICD-10-CM | POA: Diagnosis not present

## 2020-08-17 DIAGNOSIS — F410 Panic disorder [episodic paroxysmal anxiety] without agoraphobia: Secondary | ICD-10-CM | POA: Diagnosis not present

## 2020-08-17 DIAGNOSIS — N183 Chronic kidney disease, stage 3 unspecified: Secondary | ICD-10-CM | POA: Diagnosis not present

## 2020-08-17 DIAGNOSIS — S72142D Displaced intertrochanteric fracture of left femur, subsequent encounter for closed fracture with routine healing: Secondary | ICD-10-CM | POA: Diagnosis not present

## 2020-08-17 DIAGNOSIS — E1142 Type 2 diabetes mellitus with diabetic polyneuropathy: Secondary | ICD-10-CM | POA: Diagnosis not present

## 2020-08-17 DIAGNOSIS — L89511 Pressure ulcer of right ankle, stage 1: Secondary | ICD-10-CM | POA: Diagnosis not present

## 2020-08-17 DIAGNOSIS — Z4800 Encounter for change or removal of nonsurgical wound dressing: Secondary | ICD-10-CM | POA: Diagnosis not present

## 2020-08-17 DIAGNOSIS — Z7901 Long term (current) use of anticoagulants: Secondary | ICD-10-CM | POA: Diagnosis not present

## 2020-08-17 DIAGNOSIS — K219 Gastro-esophageal reflux disease without esophagitis: Secondary | ICD-10-CM | POA: Diagnosis not present

## 2020-08-17 DIAGNOSIS — E785 Hyperlipidemia, unspecified: Secondary | ICD-10-CM | POA: Diagnosis not present

## 2020-08-17 DIAGNOSIS — I482 Chronic atrial fibrillation, unspecified: Secondary | ICD-10-CM | POA: Diagnosis not present

## 2020-08-17 NOTE — Progress Notes (Signed)
Chief Complaint  Patient presents with  . Foot Pain    Rt foot pain getting worse since fall in October '21. 1-2 wks steady pain.  . Medication Refill    hydrocodone    86 year old female has chronic pain in her feet thought to be secondary to neuropathy she is on gabapentin seems to help but does not tolerate higher doses.  She is status post recent hip fracture with long gamma nail fixation on June 13, 2020  But today comes in complaining of increased right foot pain. She indicates she did get the foot caught in the sheets of the bed which caused her to fall but had no bruising or swelling.  It seems like when she started doing more physical therapy and weightbearing on the right foot the pain increased  System review chronic venous stasis dermatitis seems to be stable Status post left hip fracture released by Dr. Ihor [facility]  Past Medical History:  Diagnosis Date  . Acid reflux   . Anxiety   . CKD (chronic kidney disease)   . COPD (chronic obstructive pulmonary disease) ([place])   . Depressed   . Diabetes mellitus   . Glaucoma   . Hypercholesteremia   . Hypertension   . Legally blind in right eye, as defined in [place]   . Panic attacks   . Type 2 diabetes mellitus ([place])    Past Surgical History:  Procedure Laterality Date  . APPENDECTOMY    . BALLOON DILATION  07/16/2017   Procedure: BALLOON DILATION;  Surgeon: Michaela Binder, MD;  [facility];  Service: Endoscopy;;  . cataracts    . CHOLECYSTECTOMY    . ERCP N/A 07/16/2017   biliary papillary stenosis, filling defect c/w a stone and sludge, entire biliary tree dilated. completed biliary sphincterotomy and balloon extraction, stone removal  . ESOPHAGOGASTRODUODENOSCOPY  2006   Dr. Gala Vazquez: normal esophagus s/p empiric dilataton, small hiatal hernia  . HIP SURGERY     rod in leg as well  . INTRAMEDULLARY (IM) NAIL INTERTROCHANTERIC Left 06/13/2020   Procedure: INTRAMEDULLARY (IM) NAIL  INTERTROCHANTRIC;  Surgeon: Michaela Cancel, MD;  Location: [facility];  Service: Orthopedics;  Laterality: Left;  Marland Kitchen MASS EXCISION Left 09/26/2017   Procedure: EXCISION MUCOID CYST WITH DISTAL INTERPHALANGEAL JOINT ARTHROTOMY OF LEFT MIDDLE FINGER;  Surgeon: Michaela Cover, MD;  Location: [facility];  Service: Orthopedics;  Laterality: Left;  . SPHINCTEROTOMY  07/16/2017   Procedure: SPHINCTEROTOMY;  Surgeon: Michaela Binder, MD;  [facility];  Service: Endoscopy;;  . TUBAL LIGATION      BP 131/90   Pulse 98   Ht 5\' 2"  (1.575 m)   Wt 189 lb (85.7 kg)   BMI 34.57 kg/m   Right foot has multiple deformities which are chronic related to the great toe which shows bunion deformity crossover deformity multiple flexion contractures of the small digits of the lesser toes of the foot she does have chronic hyperemia here her tibial area shows no signs of dermatitis or hyperemia  She is tender all over with hyper sensitivity   Imaging showed no fracture with osteopenia bunion deformity and claw toe deformities  Assessment and plan  Unclear as to exact cause of patient's chronic foot pain may be peripheral neuropathy  Recommend 100 mg of gabapentin in the morning 200 mg at night Do not recommend opioid therapy continue tramadol Recommend diabetic shoes  Follow-up as needed  Encounter Diagnoses  Name Primary?  Marland Kitchen  Pain in left ankle and joints of left foot Yes  . Pain in right foot   . Diabetic autonomic neuropathy associated with other specified diabetes mellitus ([place])

## 2020-08-17 NOTE — Patient Instructions (Signed)
Use Aspercreme, Biofreeze or Voltaren gel over the counter 2-3 times daily make sure you rub it in well each time you use it.

## 2020-08-23 DIAGNOSIS — E1122 Type 2 diabetes mellitus with diabetic chronic kidney disease: Secondary | ICD-10-CM | POA: Diagnosis not present

## 2020-08-23 DIAGNOSIS — S72142D Displaced intertrochanteric fracture of left femur, subsequent encounter for closed fracture with routine healing: Secondary | ICD-10-CM | POA: Diagnosis not present

## 2020-08-23 DIAGNOSIS — L89511 Pressure ulcer of right ankle, stage 1: Secondary | ICD-10-CM | POA: Diagnosis not present

## 2020-08-23 DIAGNOSIS — S81812D Laceration without foreign body, left lower leg, subsequent encounter: Secondary | ICD-10-CM | POA: Diagnosis not present

## 2020-08-23 DIAGNOSIS — I130 Hypertensive heart and chronic kidney disease with heart failure and stage 1 through stage 4 chronic kidney disease, or unspecified chronic kidney disease: Secondary | ICD-10-CM | POA: Diagnosis not present

## 2020-08-23 DIAGNOSIS — I5032 Chronic diastolic (congestive) heart failure: Secondary | ICD-10-CM | POA: Diagnosis not present

## 2020-08-25 ENCOUNTER — Ambulatory Visit: Payer: Medicare Other | Admitting: Orthopedic Surgery

## 2020-08-25 DIAGNOSIS — E1122 Type 2 diabetes mellitus with diabetic chronic kidney disease: Secondary | ICD-10-CM | POA: Diagnosis not present

## 2020-08-25 DIAGNOSIS — L89511 Pressure ulcer of right ankle, stage 1: Secondary | ICD-10-CM | POA: Diagnosis not present

## 2020-08-25 DIAGNOSIS — I130 Hypertensive heart and chronic kidney disease with heart failure and stage 1 through stage 4 chronic kidney disease, or unspecified chronic kidney disease: Secondary | ICD-10-CM | POA: Diagnosis not present

## 2020-08-25 DIAGNOSIS — S72142D Displaced intertrochanteric fracture of left femur, subsequent encounter for closed fracture with routine healing: Secondary | ICD-10-CM | POA: Diagnosis not present

## 2020-08-25 DIAGNOSIS — S81812D Laceration without foreign body, left lower leg, subsequent encounter: Secondary | ICD-10-CM | POA: Diagnosis not present

## 2020-08-25 DIAGNOSIS — I5032 Chronic diastolic (congestive) heart failure: Secondary | ICD-10-CM | POA: Diagnosis not present

## 2020-09-01 DIAGNOSIS — I5032 Chronic diastolic (congestive) heart failure: Secondary | ICD-10-CM | POA: Diagnosis not present

## 2020-09-01 DIAGNOSIS — S72142D Displaced intertrochanteric fracture of left femur, subsequent encounter for closed fracture with routine healing: Secondary | ICD-10-CM | POA: Diagnosis not present

## 2020-09-01 DIAGNOSIS — E1122 Type 2 diabetes mellitus with diabetic chronic kidney disease: Secondary | ICD-10-CM | POA: Diagnosis not present

## 2020-09-01 DIAGNOSIS — L89511 Pressure ulcer of right ankle, stage 1: Secondary | ICD-10-CM | POA: Diagnosis not present

## 2020-09-01 DIAGNOSIS — S81812D Laceration without foreign body, left lower leg, subsequent encounter: Secondary | ICD-10-CM | POA: Diagnosis not present

## 2020-09-01 DIAGNOSIS — I130 Hypertensive heart and chronic kidney disease with heart failure and stage 1 through stage 4 chronic kidney disease, or unspecified chronic kidney disease: Secondary | ICD-10-CM | POA: Diagnosis not present

## 2020-09-08 DIAGNOSIS — I5032 Chronic diastolic (congestive) heart failure: Secondary | ICD-10-CM | POA: Diagnosis not present

## 2020-09-08 DIAGNOSIS — L89511 Pressure ulcer of right ankle, stage 1: Secondary | ICD-10-CM | POA: Diagnosis not present

## 2020-09-08 DIAGNOSIS — E1122 Type 2 diabetes mellitus with diabetic chronic kidney disease: Secondary | ICD-10-CM | POA: Diagnosis not present

## 2020-09-08 DIAGNOSIS — S81812D Laceration without foreign body, left lower leg, subsequent encounter: Secondary | ICD-10-CM | POA: Diagnosis not present

## 2020-09-08 DIAGNOSIS — I130 Hypertensive heart and chronic kidney disease with heart failure and stage 1 through stage 4 chronic kidney disease, or unspecified chronic kidney disease: Secondary | ICD-10-CM | POA: Diagnosis not present

## 2020-09-08 DIAGNOSIS — S72142D Displaced intertrochanteric fracture of left femur, subsequent encounter for closed fracture with routine healing: Secondary | ICD-10-CM | POA: Diagnosis not present

## 2020-09-10 DIAGNOSIS — B029 Zoster without complications: Secondary | ICD-10-CM | POA: Diagnosis not present

## 2020-09-13 DIAGNOSIS — S72142D Displaced intertrochanteric fracture of left femur, subsequent encounter for closed fracture with routine healing: Secondary | ICD-10-CM | POA: Diagnosis not present

## 2020-09-13 DIAGNOSIS — L89511 Pressure ulcer of right ankle, stage 1: Secondary | ICD-10-CM | POA: Diagnosis not present

## 2020-09-13 DIAGNOSIS — I5032 Chronic diastolic (congestive) heart failure: Secondary | ICD-10-CM | POA: Diagnosis not present

## 2020-09-13 DIAGNOSIS — E1122 Type 2 diabetes mellitus with diabetic chronic kidney disease: Secondary | ICD-10-CM | POA: Diagnosis not present

## 2020-09-13 DIAGNOSIS — I130 Hypertensive heart and chronic kidney disease with heart failure and stage 1 through stage 4 chronic kidney disease, or unspecified chronic kidney disease: Secondary | ICD-10-CM | POA: Diagnosis not present

## 2020-09-13 DIAGNOSIS — S81812D Laceration without foreign body, left lower leg, subsequent encounter: Secondary | ICD-10-CM | POA: Diagnosis not present

## 2020-11-03 ENCOUNTER — Other Ambulatory Visit: Payer: Self-pay | Admitting: Orthopedic Surgery

## 2020-11-03 DIAGNOSIS — E1343 Other specified diabetes mellitus with diabetic autonomic (poly)neuropathy: Secondary | ICD-10-CM

## 2020-11-03 NOTE — Telephone Encounter (Signed)
Rx request 

## 2020-11-30 DIAGNOSIS — I4891 Unspecified atrial fibrillation: Secondary | ICD-10-CM | POA: Diagnosis not present

## 2020-11-30 DIAGNOSIS — N183 Chronic kidney disease, stage 3 unspecified: Secondary | ICD-10-CM | POA: Diagnosis not present

## 2020-11-30 DIAGNOSIS — I5032 Chronic diastolic (congestive) heart failure: Secondary | ICD-10-CM | POA: Diagnosis not present

## 2020-11-30 DIAGNOSIS — B029 Zoster without complications: Secondary | ICD-10-CM | POA: Diagnosis not present

## 2020-11-30 DIAGNOSIS — N390 Urinary tract infection, site not specified: Secondary | ICD-10-CM | POA: Diagnosis not present

## 2020-11-30 DIAGNOSIS — E1165 Type 2 diabetes mellitus with hyperglycemia: Secondary | ICD-10-CM | POA: Diagnosis not present

## 2020-11-30 DIAGNOSIS — J449 Chronic obstructive pulmonary disease, unspecified: Secondary | ICD-10-CM | POA: Diagnosis not present

## 2020-11-30 DIAGNOSIS — E1142 Type 2 diabetes mellitus with diabetic polyneuropathy: Secondary | ICD-10-CM | POA: Diagnosis not present

## 2020-11-30 DIAGNOSIS — W19XXXD Unspecified fall, subsequent encounter: Secondary | ICD-10-CM | POA: Diagnosis not present

## 2020-11-30 DIAGNOSIS — I130 Hypertensive heart and chronic kidney disease with heart failure and stage 1 through stage 4 chronic kidney disease, or unspecified chronic kidney disease: Secondary | ICD-10-CM | POA: Diagnosis not present

## 2020-11-30 DIAGNOSIS — I482 Chronic atrial fibrillation, unspecified: Secondary | ICD-10-CM | POA: Diagnosis not present

## 2020-11-30 DIAGNOSIS — R300 Dysuria: Secondary | ICD-10-CM | POA: Diagnosis not present

## 2020-12-02 DIAGNOSIS — I130 Hypertensive heart and chronic kidney disease with heart failure and stage 1 through stage 4 chronic kidney disease, or unspecified chronic kidney disease: Secondary | ICD-10-CM | POA: Diagnosis not present

## 2020-12-02 DIAGNOSIS — I4891 Unspecified atrial fibrillation: Secondary | ICD-10-CM | POA: Diagnosis not present

## 2020-12-02 DIAGNOSIS — I482 Chronic atrial fibrillation, unspecified: Secondary | ICD-10-CM | POA: Diagnosis not present

## 2020-12-02 DIAGNOSIS — N184 Chronic kidney disease, stage 4 (severe): Secondary | ICD-10-CM | POA: Diagnosis not present

## 2020-12-02 DIAGNOSIS — J449 Chronic obstructive pulmonary disease, unspecified: Secondary | ICD-10-CM | POA: Diagnosis not present

## 2020-12-02 DIAGNOSIS — F419 Anxiety disorder, unspecified: Secondary | ICD-10-CM | POA: Diagnosis not present

## 2020-12-02 DIAGNOSIS — I5032 Chronic diastolic (congestive) heart failure: Secondary | ICD-10-CM | POA: Diagnosis not present

## 2020-12-02 DIAGNOSIS — M25572 Pain in left ankle and joints of left foot: Secondary | ICD-10-CM | POA: Diagnosis not present

## 2020-12-02 DIAGNOSIS — K219 Gastro-esophageal reflux disease without esophagitis: Secondary | ICD-10-CM | POA: Diagnosis not present

## 2020-12-02 DIAGNOSIS — R600 Localized edema: Secondary | ICD-10-CM | POA: Diagnosis not present

## 2020-12-02 DIAGNOSIS — B029 Zoster without complications: Secondary | ICD-10-CM | POA: Diagnosis not present

## 2020-12-02 DIAGNOSIS — N390 Urinary tract infection, site not specified: Secondary | ICD-10-CM | POA: Diagnosis not present

## 2020-12-02 DIAGNOSIS — R300 Dysuria: Secondary | ICD-10-CM | POA: Diagnosis not present

## 2020-12-02 DIAGNOSIS — G8929 Other chronic pain: Secondary | ICD-10-CM | POA: Diagnosis not present

## 2020-12-02 DIAGNOSIS — E1165 Type 2 diabetes mellitus with hyperglycemia: Secondary | ICD-10-CM | POA: Diagnosis not present

## 2020-12-02 DIAGNOSIS — E1142 Type 2 diabetes mellitus with diabetic polyneuropathy: Secondary | ICD-10-CM | POA: Diagnosis not present

## 2020-12-02 DIAGNOSIS — M25571 Pain in right ankle and joints of right foot: Secondary | ICD-10-CM | POA: Diagnosis not present

## 2020-12-02 DIAGNOSIS — N183 Chronic kidney disease, stage 3 unspecified: Secondary | ICD-10-CM | POA: Diagnosis not present

## 2020-12-02 DIAGNOSIS — W19XXXD Unspecified fall, subsequent encounter: Secondary | ICD-10-CM | POA: Diagnosis not present

## 2020-12-02 DIAGNOSIS — E875 Hyperkalemia: Secondary | ICD-10-CM | POA: Diagnosis not present

## 2021-03-07 DIAGNOSIS — E1165 Type 2 diabetes mellitus with hyperglycemia: Secondary | ICD-10-CM | POA: Diagnosis not present

## 2021-03-07 DIAGNOSIS — E782 Mixed hyperlipidemia: Secondary | ICD-10-CM | POA: Diagnosis not present

## 2021-03-07 DIAGNOSIS — R309 Painful micturition, unspecified: Secondary | ICD-10-CM | POA: Diagnosis not present

## 2021-03-09 DIAGNOSIS — I480 Paroxysmal atrial fibrillation: Secondary | ICD-10-CM | POA: Diagnosis not present

## 2021-03-09 DIAGNOSIS — Z20822 Contact with and (suspected) exposure to covid-19: Secondary | ICD-10-CM | POA: Diagnosis not present

## 2021-03-09 DIAGNOSIS — F419 Anxiety disorder, unspecified: Secondary | ICD-10-CM | POA: Diagnosis not present

## 2021-03-09 DIAGNOSIS — E785 Hyperlipidemia, unspecified: Secondary | ICD-10-CM | POA: Diagnosis not present

## 2021-03-09 DIAGNOSIS — I10 Essential (primary) hypertension: Secondary | ICD-10-CM | POA: Diagnosis not present

## 2021-03-09 DIAGNOSIS — H9202 Otalgia, left ear: Secondary | ICD-10-CM | POA: Diagnosis not present

## 2021-03-09 DIAGNOSIS — G894 Chronic pain syndrome: Secondary | ICD-10-CM | POA: Diagnosis not present

## 2021-03-09 DIAGNOSIS — N390 Urinary tract infection, site not specified: Secondary | ICD-10-CM | POA: Diagnosis not present

## 2021-03-09 DIAGNOSIS — K219 Gastro-esophageal reflux disease without esophagitis: Secondary | ICD-10-CM | POA: Diagnosis not present

## 2021-03-09 DIAGNOSIS — U071 COVID-19: Secondary | ICD-10-CM | POA: Diagnosis not present

## 2021-03-09 DIAGNOSIS — E1165 Type 2 diabetes mellitus with hyperglycemia: Secondary | ICD-10-CM | POA: Diagnosis not present

## 2021-03-09 DIAGNOSIS — N184 Chronic kidney disease, stage 4 (severe): Secondary | ICD-10-CM | POA: Diagnosis not present

## 2021-03-18 ENCOUNTER — Other Ambulatory Visit: Payer: Self-pay | Admitting: Orthopedic Surgery

## 2021-03-18 DIAGNOSIS — E1343 Other specified diabetes mellitus with diabetic autonomic (poly)neuropathy: Secondary | ICD-10-CM

## 2021-04-06 DIAGNOSIS — R059 Cough, unspecified: Secondary | ICD-10-CM | POA: Diagnosis not present

## 2021-04-06 DIAGNOSIS — R5383 Other fatigue: Secondary | ICD-10-CM | POA: Diagnosis not present

## 2021-04-06 DIAGNOSIS — K5900 Constipation, unspecified: Secondary | ICD-10-CM | POA: Diagnosis not present

## 2021-04-08 ENCOUNTER — Other Ambulatory Visit: Payer: Self-pay

## 2021-04-08 ENCOUNTER — Encounter (HOSPITAL_COMMUNITY): Payer: Self-pay | Admitting: Emergency Medicine

## 2021-04-08 ENCOUNTER — Inpatient Hospital Stay (HOSPITAL_COMMUNITY)
Admission: EM | Admit: 2021-04-08 | Discharge: 2021-04-11 | DRG: 871 | Disposition: A | Discharge status: Home Health | Payer: Medicare Other | Attending: Internal Medicine | Admitting: Internal Medicine

## 2021-04-08 ENCOUNTER — Emergency Department (HOSPITAL_COMMUNITY): Payer: Medicare Other

## 2021-04-08 DIAGNOSIS — D631 Anemia in chronic kidney disease: Secondary | ICD-10-CM | POA: Diagnosis not present

## 2021-04-08 DIAGNOSIS — A419 Sepsis, unspecified organism: Principal | ICD-10-CM | POA: Diagnosis present

## 2021-04-08 DIAGNOSIS — I129 Hypertensive chronic kidney disease with stage 1 through stage 4 chronic kidney disease, or unspecified chronic kidney disease: Secondary | ICD-10-CM | POA: Diagnosis present

## 2021-04-08 DIAGNOSIS — Z885 Allergy status to narcotic agent status: Secondary | ICD-10-CM

## 2021-04-08 DIAGNOSIS — Z79899 Other long term (current) drug therapy: Secondary | ICD-10-CM

## 2021-04-08 DIAGNOSIS — H401133 Primary open-angle glaucoma, bilateral, severe stage: Secondary | ICD-10-CM | POA: Diagnosis present

## 2021-04-08 DIAGNOSIS — J9690 Respiratory failure, unspecified, unspecified whether with hypoxia or hypercapnia: Secondary | ICD-10-CM | POA: Diagnosis not present

## 2021-04-08 DIAGNOSIS — Z7901 Long term (current) use of anticoagulants: Secondary | ICD-10-CM | POA: Diagnosis not present

## 2021-04-08 DIAGNOSIS — Z794 Long term (current) use of insulin: Secondary | ICD-10-CM

## 2021-04-08 DIAGNOSIS — Z66 Do not resuscitate: Secondary | ICD-10-CM | POA: Diagnosis present

## 2021-04-08 DIAGNOSIS — K8020 Calculus of gallbladder without cholecystitis without obstruction: Secondary | ICD-10-CM | POA: Diagnosis not present

## 2021-04-08 DIAGNOSIS — Z9049 Acquired absence of other specified parts of digestive tract: Secondary | ICD-10-CM | POA: Diagnosis not present

## 2021-04-08 DIAGNOSIS — Z8616 Personal history of COVID-19: Secondary | ICD-10-CM

## 2021-04-08 DIAGNOSIS — K219 Gastro-esophageal reflux disease without esophagitis: Secondary | ICD-10-CM | POA: Diagnosis present

## 2021-04-08 DIAGNOSIS — J9601 Acute respiratory failure with hypoxia: Secondary | ICD-10-CM | POA: Diagnosis not present

## 2021-04-08 DIAGNOSIS — Z821 Family history of blindness and visual loss: Secondary | ICD-10-CM

## 2021-04-08 DIAGNOSIS — K8032 Calculus of bile duct with acute cholangitis without obstruction: Secondary | ICD-10-CM | POA: Diagnosis not present

## 2021-04-08 DIAGNOSIS — I482 Chronic atrial fibrillation, unspecified: Secondary | ICD-10-CM | POA: Diagnosis present

## 2021-04-08 DIAGNOSIS — K8309 Other cholangitis: Secondary | ICD-10-CM

## 2021-04-08 DIAGNOSIS — Z79890 Hormone replacement therapy (postmenopausal): Secondary | ICD-10-CM

## 2021-04-08 DIAGNOSIS — I4891 Unspecified atrial fibrillation: Secondary | ICD-10-CM | POA: Diagnosis not present

## 2021-04-08 DIAGNOSIS — E1122 Type 2 diabetes mellitus with diabetic chronic kidney disease: Secondary | ICD-10-CM | POA: Diagnosis present

## 2021-04-08 DIAGNOSIS — R109 Unspecified abdominal pain: Secondary | ICD-10-CM

## 2021-04-08 DIAGNOSIS — E11649 Type 2 diabetes mellitus with hypoglycemia without coma: Secondary | ICD-10-CM | POA: Diagnosis not present

## 2021-04-08 DIAGNOSIS — J449 Chronic obstructive pulmonary disease, unspecified: Secondary | ICD-10-CM | POA: Diagnosis present

## 2021-04-08 DIAGNOSIS — Z20822 Contact with and (suspected) exposure to covid-19: Secondary | ICD-10-CM | POA: Diagnosis not present

## 2021-04-08 DIAGNOSIS — N1832 Chronic kidney disease, stage 3b: Secondary | ICD-10-CM | POA: Diagnosis present

## 2021-04-08 DIAGNOSIS — E1165 Type 2 diabetes mellitus with hyperglycemia: Secondary | ICD-10-CM | POA: Diagnosis present

## 2021-04-08 DIAGNOSIS — F419 Anxiety disorder, unspecified: Secondary | ICD-10-CM | POA: Diagnosis present

## 2021-04-08 DIAGNOSIS — E7800 Pure hypercholesterolemia, unspecified: Secondary | ICD-10-CM | POA: Diagnosis present

## 2021-04-08 DIAGNOSIS — H548 Legal blindness, as defined in USA: Secondary | ICD-10-CM | POA: Diagnosis present

## 2021-04-08 DIAGNOSIS — R0902 Hypoxemia: Secondary | ICD-10-CM | POA: Diagnosis not present

## 2021-04-08 LAB — COMPREHENSIVE METABOLIC PANEL
ALT: 63 U/L — ABNORMAL HIGH (ref 0–44)
AST: 97 U/L — ABNORMAL HIGH (ref 15–41)
Albumin: 3.5 g/dL (ref 3.5–5.0)
Alkaline Phosphatase: 349 U/L — ABNORMAL HIGH (ref 38–126)
Anion gap: 11 (ref 5–15)
BUN: 26 mg/dL — ABNORMAL HIGH (ref 8–23)
CO2: 25 mmol/L (ref 22–32)
Calcium: 8.5 mg/dL — ABNORMAL LOW (ref 8.9–10.3)
Chloride: 96 mmol/L — ABNORMAL LOW (ref 98–111)
Creatinine, Ser: 1.52 mg/dL — ABNORMAL HIGH (ref 0.44–1.00)
GFR, Estimated: 32 mL/min — ABNORMAL LOW (ref >60)
Glucose, Bld: 60 mg/dL — ABNORMAL LOW (ref 70–99)
Potassium: 4.1 mmol/L (ref 3.5–5.1)
Sodium: 132 mmol/L — ABNORMAL LOW (ref 135–145)
Total Bilirubin: 3.8 mg/dL — ABNORMAL HIGH (ref 0.3–1.2)
Total Protein: 7.2 g/dL (ref 6.5–8.1)

## 2021-04-08 LAB — URINALYSIS, ROUTINE W REFLEX MICROSCOPIC
Bacteria, UA: NONE SEEN
Bilirubin Urine: NEGATIVE
Glucose, UA: NEGATIVE mg/dL
Ketones, ur: NEGATIVE mg/dL
Leukocytes,Ua: NEGATIVE
Nitrite: NEGATIVE
Protein, ur: NEGATIVE mg/dL
Specific Gravity, Urine: 1.006 (ref 1.005–1.030)
pH: 5 (ref 5.0–8.0)

## 2021-04-08 LAB — CBC WITH DIFFERENTIAL/PLATELET
Abs Immature Granulocytes: 0.06 10*3/uL (ref 0.00–0.07)
Basophils Absolute: 0 10*3/uL (ref 0.0–0.1)
Basophils Relative: 0 %
Eosinophils Absolute: 0 10*3/uL (ref 0.0–0.5)
Eosinophils Relative: 0 %
HCT: 39.9 % (ref 36.0–46.0)
Hemoglobin: 13.1 g/dL (ref 12.0–15.0)
Immature Granulocytes: 0 %
Lymphocytes Relative: 2 %
Lymphs Abs: 0.4 10*3/uL — ABNORMAL LOW (ref 0.7–4.0)
MCH: 29.8 pg (ref 26.0–34.0)
MCHC: 32.8 g/dL (ref 30.0–36.0)
MCV: 90.9 fL (ref 80.0–100.0)
Monocytes Absolute: 0.2 10*3/uL (ref 0.1–1.0)
Monocytes Relative: 2 %
Neutro Abs: 13.7 10*3/uL — ABNORMAL HIGH (ref 1.7–7.7)
Neutrophils Relative %: 96 %
Platelets: 420 10*3/uL — ABNORMAL HIGH (ref 150–400)
RBC: 4.39 MIL/uL (ref 3.87–5.11)
RDW: 15 % (ref 11.5–15.5)
WBC: 14.5 10*3/uL — ABNORMAL HIGH (ref 4.0–10.5)
nRBC: 0 % (ref 0.0–0.2)

## 2021-04-08 LAB — LIPASE, BLOOD: Lipase: 18 U/L (ref 11–51)

## 2021-04-08 LAB — PROTIME-INR
INR: 2.6 — ABNORMAL HIGH (ref 0.8–1.2)
Prothrombin Time: 28.2 seconds — ABNORMAL HIGH (ref 11.4–15.2)

## 2021-04-08 LAB — APTT: aPTT: 46 seconds — ABNORMAL HIGH (ref 24–36)

## 2021-04-08 LAB — LACTIC ACID, PLASMA: Lactic Acid, Venous: 1 mmol/L (ref 0.5–1.9)

## 2021-04-08 MED ORDER — ACETAMINOPHEN 325 MG PO TABS: 650.0000 mg | ORAL_TABLET | Freq: Four times a day (QID) | ORAL | Status: DC | PRN

## 2021-04-08 MED ORDER — ONDANSETRON HCL 4 MG/2ML IJ SOLN: 4.0000 mg | Freq: Four times a day (QID) | INTRAMUSCULAR | Status: DC | PRN

## 2021-04-08 MED ORDER — ALBUTEROL SULFATE HFA 108 (90 BASE) MCG/ACT IN AERS: 2.0000 | INHALATION_SPRAY | RESPIRATORY_TRACT | Status: DC | PRN

## 2021-04-08 MED ORDER — SODIUM CHLORIDE 0.9 % IV SOLN: INTRAVENOUS | Status: DC

## 2021-04-08 MED ORDER — ACETAMINOPHEN 650 MG RE SUPP: 650.0000 mg | Freq: Four times a day (QID) | RECTAL | Status: DC | PRN

## 2021-04-08 MED ORDER — ONDANSETRON HCL 4 MG PO TABS: 4.0000 mg | ORAL_TABLET | Freq: Four times a day (QID) | ORAL | Status: DC | PRN

## 2021-04-08 MED ORDER — IOHEXOL 300 MG/ML  SOLN
75.0000 mL | Freq: Once | INTRAMUSCULAR | Status: AC | PRN
  Administered 2021-04-08: 75 mL via INTRAVENOUS

## 2021-04-08 MED ORDER — INSULIN ASPART 100 UNIT/ML IJ SOLN
0.0000 [IU] | INTRAMUSCULAR | Status: DC
  Administered 2021-04-09 – 2021-04-10 (×2): 2 [IU] via SUBCUTANEOUS
  Administered 2021-04-10: 1 [IU] via SUBCUTANEOUS
  Administered 2021-04-10: 2 [IU] via SUBCUTANEOUS
  Administered 2021-04-10: 1 [IU] via SUBCUTANEOUS
  Administered 2021-04-10: 2 [IU] via SUBCUTANEOUS
  Administered 2021-04-11: 1 [IU] via SUBCUTANEOUS
  Administered 2021-04-11: 2 [IU] via SUBCUTANEOUS
  Administered 2021-04-11: 3 [IU] via SUBCUTANEOUS

## 2021-04-08 MED ORDER — PIPERACILLIN-TAZOBACTAM 3.375 G IVPB 30 MIN
3.3750 g | Freq: Once | INTRAVENOUS | Status: AC
  Administered 2021-04-08: 3.375 g via INTRAVENOUS
  Filled 2021-04-08: qty 50

## 2021-04-08 MED ORDER — FENTANYL CITRATE PF 50 MCG/ML IJ SOSY
12.5000 ug | PREFILLED_SYRINGE | INTRAMUSCULAR | Status: DC | PRN
  Administered 2021-04-09: 50 ug via INTRAVENOUS
  Filled 2021-04-08: qty 1

## 2021-04-08 MED ORDER — FENTANYL CITRATE PF 50 MCG/ML IJ SOSY
50.0000 ug | PREFILLED_SYRINGE | Freq: Once | INTRAMUSCULAR | Status: AC
  Administered 2021-04-08: 50 ug via INTRAVENOUS
  Filled 2021-04-08: qty 1

## 2021-04-08 NOTE — ED Notes (Signed)
Patient transported to CT 

## 2021-04-08 NOTE — ED Triage Notes (Signed)
Pt seen 3 days ago for abdominal pain. Prescribed miralax with no relief. Pt c/o epigastric pain. HR noted to be 170s at triage. EKG obtained and given to Sabra Heck, MD

## 2021-04-08 NOTE — ED Provider Notes (Signed)
[facility] EMERGENCY DEPARTMENT Provider Note   CSN: [id number] Arrival date & time: 04/08/21  2002     History Chief Complaint  Patient presents with   Abdominal Pain   Chest Pain    [name] is a 87 y.o. female.   Abdominal Pain Associated symptoms: chest pain   Chest Pain Associated symptoms: abdominal pain    This patient is a 87 year old female with a history of type 2 diabetes, she is legally blind in the right eye, she has hypertension and COPD.  She presents with a complaint of abdominal discomfort which is been going on for the better part of 4 or 5 days.  She was already seen at her family doctor's who advised her to take MiraLAX which the family member has given her daily for the last 3 days with minimal stool output.  She was told that she needed to come to the hospital for further evaluation should the symptoms worsen.  Today she has had some shakes and chills and feels like she has rigors, there is no urinary symptoms, no diarrhea, no rectal bleeding, no coughing or shortness of breath.  The symptoms are persistent, gradually worsening, she has not had any significant amount of stool out.  She does not have any urinary symptoms.  The family member at the bedside does not have any other information at this time either.  Past Medical History:  Diagnosis Date   Acid reflux    Anxiety    CKD (chronic kidney disease)    COPD (chronic obstructive pulmonary disease) ([place])    Depressed    Diabetes mellitus    Glaucoma    Hypercholesteremia    Hypertension    Legally blind in right eye, as defined in [place]    Panic attacks    Type 2 diabetes mellitus ([place])     Patient Active Problem List   Diagnosis Date Noted   Vitamin D deficiency 06/14/2020   Closed comminuted intertrochanteric fracture of left femur ([place]) 06/12/2020   Osteoarthritis of finger of left hand 11/06/2017   Choledocholithiasis    Common bile duct dilatation 07/14/2017   Essential hypertension  07/14/2017   Elevated LFTs 07/14/2017   CKD (chronic kidney disease) stage 3, GFR 30-59 ml/min ([place]) 07/14/2017   CAP (community acquired pneumonia) 06/30/2017   Hypercholesteremia 06/30/2017   Glaucoma 06/30/2017   Anxiety 06/30/2017   Acid reflux 06/30/2017   Primary open angle glaucoma of both eyes, severe stage 06/03/2013   Other and combined forms of senile cataract 09/10/2011   Osteoarthritis 07/21/2011   Altered mental status 07/21/2011   COPD (chronic obstructive pulmonary disease) ([place]) 07/21/2011   UTI (lower urinary tract infection) 07/21/2011   Heart failure ([place]) 07/09/2011   Hyperkalemia 07/09/2011   Type 2 diabetes mellitus with hyperglycemia ([place]) 07/09/2011   Cataract 06/04/2011   TOTAL HIP FOLLOW-UP 04/14/2008   HIP PAIN 03/17/2008   SCIATICA 03/17/2008   CUBITAL TUNNEL SYNDROME 02/10/2008   KNEE, ARTHRITIS, DEGEN./OSTEO 02/10/2008   KNEE PAIN 02/10/2008   NECK PAIN 12/24/2007   SHOULDER PAIN 11/17/2007   RUPTURE ROTATOR CUFF 11/17/2007   Type 2 diabetes mellitus ([place]) 05/13/2007    Past Surgical History:  Procedure Laterality Date   APPENDECTOMY     BALLOON DILATION  07/16/2017   Procedure: BALLOON DILATION;  Surgeon: Danie Binder, MD;  Location: [facility];  Service: Endoscopy;;   cataracts     CHOLECYSTECTOMY     ERCP N/A 07/16/2017  biliary papillary stenosis, filling defect c/w a stone and sludge, entire biliary tree dilated. completed biliary sphincterotomy and balloon extraction, stone removal   ESOPHAGOGASTRODUODENOSCOPY  2006   Dr. Gala Romney: normal esophagus s/p empiric dilataton, small hiatal hernia   HIP SURGERY     rod in leg as well   INTRAMEDULLARY (IM) NAIL INTERTROCHANTERIC Left 06/13/2020   Procedure: INTRAMEDULLARY (IM) NAIL INTERTROCHANTRIC;  Surgeon: [name], MD;  Location: [facility];  Service: Orthopedics;  Laterality: Left;   MASS EXCISION Left 09/26/2017   Procedure: EXCISION MUCOID CYST WITH DISTAL INTERPHALANGEAL JOINT  ARTHROTOMY OF LEFT MIDDLE FINGER;  Surgeon: Leanora Cover, MD;  Location: [place];  Service: Orthopedics;  Laterality: Left;   SPHINCTEROTOMY  07/16/2017   Procedure: SPHINCTEROTOMY;  Surgeon: Danie Binder, MD;  Location[facility];  Service: Endoscopy;;   TUBAL LIGATION       OB History   No obstetric history on file.     Family History  Problem Relation Age of Onset   Blindness Father    Bone cancer Father    Brain cancer Sister    Alzheimer's disease Brother    Colon cancer Neg Hx     Social History   Tobacco Use   Smoking status: Never   Smokeless tobacco: Never  Vaping Use   Vaping Use: Never used  Substance Use Topics   Alcohol use: No   Drug use: No    Home Medications Prior to Admission medications   Medication Sig Start Date End Date Taking? Authorizing Provider  amLODipine (NORVASC) 5 MG tablet Take 5 mg by mouth daily.    [provider]  Difluprednate (DUREZOL) 0.05 % EMUL Apply 1 drop to eye daily.    [provider]  furosemide (LASIX) 20 MG tablet Take 20 mg daily as needed by mouth for fluid.    [provider]  gabapentin (NEURONTIN) 100 MG capsule TAKE 1 CAPSULE BY MOUTH THREE TIMES A DAY 03/20/21   Carole Civil, MD  glipiZIDE (GLUCOTROL) 5 MG tablet Take 5 mg by mouth 2 (two) times daily.    [provider]  HYDROcodone-acetaminophen (NORCO) 5-325 MG tablet Take 1-2 tablets by mouth every 6 (six) hours as needed for moderate pain or severe pain. Patient not taking: Reported on 08/17/2020 06/16/20   Riesa Pope, MD  LOKELMA 10 g PACK packet Take 1 packet by mouth daily. 03/17/20   [provider]  omeprazole (PRILOSEC) 20 MG capsule Take 20 mg by mouth daily.    [provider]  ondansetron (ZOFRAN) 4 MG tablet Take 4 mg by mouth every 8 (eight) hours as needed for nausea/vomiting. 05/13/20   [provider]  pravastatin (PRAVACHOL) 40 MG tablet Take 40  mg by mouth daily.    [provider]  traMADol (ULTRAM) 50 MG tablet Take 1 tablet (50 mg total) by mouth every 12 (twelve) hours as needed for severe pain. 10/13/19   Wurst, [place], PA-C  TRESIBA FLEXTOUCH 100 UNIT/ML FlexTouch Pen Inject 10 Units into the skin daily.  12/08/19   [provider]  XARELTO 15 MG TABS tablet Take 15 mg by mouth daily. 12/01/19   [provider]    Allergies    Hydromorphone hcl  Review of Systems   Review of Systems  Cardiovascular:  Positive for chest pain.  Gastrointestinal:  Positive for abdominal pain.  All other systems reviewed and are negative.  Physical Exam Updated Vital Signs BP 128/88  Pulse (!) 111   Temp (!) 101.8 F (38.8 C) (Rectal)   Resp 15   Ht 1.575 m (5\' 2" )   Wt 71.2 kg   SpO2 92%   BMI 28.72 kg/m   Physical Exam Vitals and nursing note reviewed.  Constitutional:      General: She is not in acute distress.    Appearance: She is well-developed.  HENT:     Head: Normocephalic and atraumatic.     Mouth/Throat:     Pharynx: No oropharyngeal exudate.  Eyes:     General: No scleral icterus.       Right eye: No discharge.        Left eye: No discharge.     Conjunctiva/sclera: Conjunctivae normal.     Pupils: Pupils are equal, round, and reactive to light.  Neck:     Thyroid: No thyromegaly.     Vascular: No JVD.  Cardiovascular:     Rate and Rhythm: Tachycardia present. Rhythm irregular.     Heart sounds: Normal heart sounds. No murmur heard.   No friction rub. No gallop.  Pulmonary:     Effort: Pulmonary effort is normal. No respiratory distress.     Breath sounds: Normal breath sounds. No wheezing or rales.  Abdominal:     General: Bowel sounds are normal. There is no distension.     Palpations: Abdomen is soft. There is no mass.     Tenderness: There is no abdominal tenderness.  Musculoskeletal:        General: No tenderness. Normal range of motion.     Cervical back: Normal range of  motion and neck supple.  Lymphadenopathy:     Cervical: No cervical adenopathy.  Skin:    General: Skin is warm and dry.     Findings: No erythema or rash.  Neurological:     Mental Status: She is alert.     Coordination: Coordination normal.  Psychiatric:        Behavior: Behavior normal.    ED Results / Procedures / Treatments   Labs (all labs ordered are listed, but only abnormal results are displayed) Labs Reviewed  URINALYSIS, ROUTINE W REFLEX MICROSCOPIC - Abnormal; Notable for the following components:      Result Value   Hgb urine dipstick SMALL (*)    All other components within normal limits  CBC WITH DIFFERENTIAL/PLATELET - Abnormal; Notable for the following components:   WBC 14.5 (*)    Platelets 420 (*)    Neutro Abs 13.7 (*)    Lymphs Abs 0.4 (*)    All other components within normal limits  COMPREHENSIVE METABOLIC PANEL - Abnormal; Notable for the following components:   Sodium 132 (*)    Chloride 96 (*)    Glucose, Bld 60 (*)    BUN 26 (*)    Creatinine, Ser 1.52 (*)    Calcium 8.5 (*)    AST 97 (*)    ALT 63 (*)    Alkaline Phosphatase 349 (*)    Total Bilirubin 3.8 (*)    GFR, Estimated 32 (*)    All other components within normal limits  URINE CULTURE  LACTIC ACID, PLASMA    EKG EKG Interpretation  Date/Time:  Saturday April 08 2021 20:15:45 EDT Ventricular Rate:  108 PR Interval:    QRS Duration: 78 QT Interval:  338 QTC Calculation: 452 R Axis:   -25 Text Interpretation: Atrial fibrillation with rapid ventricular response Low voltage QRS Septal infarct , age  undetermined Abnormal ECG Since last tracing rate faster Confirmed by Noemi Chapel [phone]) on 04/08/2021 8:35:21 PM  Radiology CT ABDOMEN PELVIS W CONTRAST  Result Date: 04/08/2021 CLINICAL DATA:  Acute abdominal pain for several days EXAM: CT ABDOMEN AND PELVIS WITH CONTRAST TECHNIQUE: Multidetector CT imaging of the abdomen and pelvis was performed using the standard protocol  following bolus administration of intravenous contrast. CONTRAST:  102mL OMNIPAQUE IOHEXOL 300 MG/ML  SOLN COMPARISON:  07/14/2017 FINDINGS: Lower chest: No acute abnormality. Hepatobiliary: Gallbladder has been surgically removed. The liver is within normal limits with the exception of significant biliary ductal dilatation. This continues to the level of the ampulla. Focal density is noted in the distal common bile duct suspicious for choledocholithiasis. Pancreas: Pancreas demonstrates diffuse fatty infiltration. Spleen: Normal in size without focal abnormality. Adrenals/Urinary Tract: Adrenal glands are unremarkable. Kidneys demonstrate no renal calculi. No obstructive changes are seen. Parapelvic cysts are noted bilaterally. The ureters are within normal limits bilaterally. The bladder is decompressed. Stomach/Bowel: Scattered diverticular change of the colon is noted without evidence of definitive diverticulitis. No obstructive or inflammatory changes of the colon are seen. The appendix has been surgically removed. Small bowel and stomach appear within normal limits. Vascular/Lymphatic: Aortic atherosclerosis. No enlarged abdominal or pelvic lymph nodes. Reproductive: Status post hysterectomy. No adnexal masses. Other: Small fat containing umbilical hernia is noted. No free fluid is noted. Musculoskeletal: Postsurgical changes are noted in the proximal femurs. Degenerative change of the lumbar spine is noted. Mild anterolisthesis of L4 on L5 is seen. IMPRESSION: Significant intrahepatic and extrahepatic biliary ductal dilatation. On the coronal reconstructions there are findings suspicious for distal choledocholithiasis. The degree of dilatation is similar to that seen in 2018. Need for further workup can be determined on a clinical basis. Diverticulosis without diverticulitis. No other acute abnormality is noted. Electronically Signed   By: Inez Catalina M.D.   On: 04/08/2021 22:48    Procedures .Critical  Care  Date/Time: 04/08/2021 11:03 PM Performed by: Noemi Chapel, MD Authorized by: Noemi Chapel, MD   Critical care provider statement:    Critical care time (minutes):  35   Critical care time was exclusive of:  Separately billable procedures and treating other patients and teaching time   Critical care was necessary to treat or prevent imminent or life-threatening deterioration of the following conditions:  Sepsis and hepatic failure   Critical care was time spent personally by me on the following activities:  Blood draw for specimens, development of treatment plan with patient or surrogate, discussions with consultants, evaluation of patient's response to treatment, examination of patient, obtaining history from patient or surrogate, ordering and performing treatments and interventions, ordering and review of laboratory studies, ordering and review of radiographic studies, pulse oximetry, re-evaluation of patient's condition and review of old charts   Medications Ordered in ED Medications  0.9 %  sodium chloride infusion ( Intravenous New Bag/Given 04/08/21 2258)  piperacillin-tazobactam (ZOSYN) IVPB 3.375 g (has no administration in time range)  fentaNYL (SUBLIMAZE) injection 50 mcg (50 mcg Intravenous Given 04/08/21 2251)  iohexol (OMNIPAQUE) 300 MG/ML solution 75 mL (75 mLs Intravenous Contrast Given 04/08/21 2213)    ED Course  I have reviewed the triage vital signs and the nursing notes.  Pertinent labs & imaging results that were available during my care of the patient were reviewed by me and considered in my medical decision making (see chart for details).    MDM Rules/Calculators/A&P  Pt has sepsis The CT shows severe dilation of the biliary system Has had similar int he past - but seems obstructed given LFT's Fever, tachycardia, and findings suggest cholangitis Severely ill - discuss with hospitalist - admit  The patient is critically ill with  what appears to be sepsis likely from his biliary system, interestingly the CT scan from 2018 showed similar biliary dilatation however today he has fever and a bilirubin which is crept up to close to 4.  Zosyn, fluid n.p.o.  Final Clinical Impression(s) / ED Diagnoses Final diagnoses:  Sepsis without acute organ dysfunction, due to unspecified organism [employer])  Cholangitis     Noemi Chapel, MD 04/08/21 2305

## 2021-04-08 NOTE — H&P (Signed)
History and Physical    Michaela SAMARIN MVH:[id number] DOB: [birth year] DOA: 04/08/2021  PCP: Celene Squibb, MD   Patient coming from: Home   Chief Complaint: Abdominal pain, nausea, chills   HPI: [name] is a 87 y.o. female with medical history significant for COPD, CKD IIIb, atrial fibrillation, and insulin-dependent diabetes mellitus, now presenting to the emergency department for evaluation of abdominal pain, nausea, and chills.  Patient reports 4 days of abdominal pain that is most severe on the right, associated with nausea but no vomiting, and more recent shaking chills.  She denies any cough, shortness of breath, diarrhea, or dysuria.  She had tried some MiraLAX for the past 3 days for her symptoms but they continue to worsen.  ED Course: Upon arrival to the ED, patient is found to be febrile to 38.8 C, saturating mid 90s on 4 L/min of supplemental oxygen, tachycardic in the low 100s, and with stable blood pressure.  EKG features atrial fibrillation with rate 108.  Chemistry panel notable for creatinine 1.52, alkaline phosphatase 349, AST 97, ALT 63, and total bilirubin 3.8.  CBC with leukocytosis to 14,500.  CT of the abdomen and pelvis notable for significant intra and extrahepatic biliary ductal dilatation and findings suspicious for choledocholithiasis.  Patient was given fentanyl and started on IV fluids and Zosyn in the emergency department.  Review of Systems:  All other systems reviewed and apart from HPI, are negative.  Past Medical History:  Diagnosis Date   Acid reflux    Anxiety    CKD (chronic kidney disease)    COPD (chronic obstructive pulmonary disease) ([place])    Depressed    Diabetes mellitus    Glaucoma    Hypercholesteremia    Hypertension    Legally blind in right eye, as defined in [place]    Panic attacks    Type 2 diabetes mellitus ([place])     Past Surgical History:  Procedure Laterality Date   APPENDECTOMY     BALLOON DILATION  07/16/2017   Procedure:  BALLOON DILATION;  Surgeon: Danie Binder, MD;  [facility];  Service: Endoscopy;;   cataracts     CHOLECYSTECTOMY     ERCP N/A 07/16/2017   biliary papillary stenosis, filling defect c/w a stone and sludge, entire biliary tree dilated. completed biliary sphincterotomy and balloon extraction, stone removal   ESOPHAGOGASTRODUODENOSCOPY  2006   Dr. Gala Romney: normal esophagus s/p empiric dilataton, small hiatal hernia   HIP SURGERY     rod in leg as well   INTRAMEDULLARY (IM) NAIL INTERTROCHANTERIC Left 06/13/2020   Procedure: INTRAMEDULLARY (IM) NAIL INTERTROCHANTRIC;  Surgeon: [name], MD;  Location: [place];  Service: Orthopedics;  Laterality: Left;   MASS EXCISION Left 09/26/2017   Procedure: EXCISION MUCOID CYST WITH DISTAL INTERPHALANGEAL JOINT ARTHROTOMY OF LEFT MIDDLE FINGER;  Surgeon: Leanora Cover, MD;  Location: [place];  Service: Orthopedics;  Laterality: Left;   SPHINCTEROTOMY  07/16/2017   Procedure: SPHINCTEROTOMY;  Surgeon: Danie Binder, MD;  Location: [facility];  Service: Endoscopy;;   TUBAL LIGATION      Social History:   reports that she has never smoked. She has never used smokeless tobacco. She reports that she does not drink alcohol and does not use drugs.  Allergies  Allergen Reactions   Hydromorphone Hcl Other (See Comments)    Patient goes out of right state of mind.     Family History  Problem Relation Age of  Onset   Blindness Father    Bone cancer Father    Brain cancer Sister    Alzheimer's disease Brother    Colon cancer Neg Hx      Prior to Admission medications   Medication Sig Start Date End Date Taking? Authorizing Provider  amLODipine (NORVASC) 5 MG tablet Take 5 mg by mouth daily.    [provider]  Difluprednate (DUREZOL) 0.05 % EMUL Apply 1 drop to eye daily.    [provider]  furosemide (LASIX) 20 MG tablet Take 20 mg daily as needed by mouth for fluid.    [provider]   gabapentin (NEURONTIN) 100 MG capsule TAKE 1 CAPSULE BY MOUTH THREE TIMES A DAY 03/20/21   Carole Civil, MD  glipiZIDE (GLUCOTROL) 5 MG tablet Take 5 mg by mouth 2 (two) times daily.    [provider]  HYDROcodone-acetaminophen (NORCO) 5-325 MG tablet Take 1-2 tablets by mouth every 6 (six) hours as needed for moderate pain or severe pain. Patient not taking: Reported on 08/17/2020 06/16/20   Riesa Pope, MD  LOKELMA 10 g PACK packet Take 1 packet by mouth daily. 03/17/20   [provider]  omeprazole (PRILOSEC) 20 MG capsule Take 20 mg by mouth daily.    [provider]  ondansetron (ZOFRAN) 4 MG tablet Take 4 mg by mouth every 8 (eight) hours as needed for nausea/vomiting. 05/13/20   [provider]  pravastatin (PRAVACHOL) 40 MG tablet Take 40 mg by mouth daily.    [provider]  traMADol (ULTRAM) 50 MG tablet Take 1 tablet (50 mg total) by mouth every 12 (twelve) hours as needed for severe pain. 10/13/19   [place], [place], PA-C  TRESIBA FLEXTOUCH 100 UNIT/ML FlexTouch Pen Inject 10 Units into the skin daily.  12/08/19   [provider]  XARELTO 15 MG TABS tablet Take 15 mg by mouth daily. 12/01/19   [provider]    Physical Exam: Vitals:   04/08/21 2145 04/08/21 2157 04/08/21 2200 04/08/21 2300  BP: 130/77  140/83 128/88  Pulse: (!) 106  (!) 117 (!) 111  Resp: (!) 24  (!) 26 15  Temp:  (!) 101.8 F (38.8 C)    TempSrc:  Rectal    SpO2: 100%  92% 92%  Weight:      Height:        Constitutional: NAD, calm  Eyes: PERTLA, lids and conjunctivae normal ENMT: Mucous membranes are moist. Posterior pharynx clear of any exudate or lesions.   Neck: supple, no masses  Respiratory: clear to auscultation bilaterally, no wheezing, no crackles.   Cardiovascular: Rate ~120 and irregularly irregular. No extremity edema.   Abdomen: soft, tender at RUQ, no rebound pain or guarding. Bowel sounds active.   Musculoskeletal: no clubbing / cyanosis. No joint deformity upper and lower extremities.   Skin: no significant rashes, lesions, ulcers. Warm, dry, well-perfused. Neurologic: No gross facial asymmetry. Alert, conversant. Moving all extremities.  Psychiatric: Very pleasant. Cooperative.    Labs and Imaging on Admission: I have personally reviewed following labs and imaging studies  CBC: Recent Labs  Lab 04/08/21 2041  WBC 14.5*  NEUTROABS 13.7*  HGB 13.1  HCT 39.9  MCV 90.9  PLT 387*   Basic Metabolic Panel: Recent Labs  Lab 04/08/21 2041  NA 132*  K 4.1  CL 96*  CO2 25  GLUCOSE 60*  BUN 26*  CREATININE 1.52*  CALCIUM 8.5*   GFR: Estimated Creatinine Clearance: 22.7  mL/min (A) (by C-G formula based on SCr of 1.52 mg/dL (H)). Liver Function Tests: Recent Labs  Lab 04/08/21 2041  AST 97*  ALT 63*  ALKPHOS 349*  BILITOT 3.8*  PROT 7.2  ALBUMIN 3.5   Recent Labs  Lab 04/08/21 2243  LIPASE 18   No results for input(s): AMMONIA in the last 168 hours. Coagulation Profile: Recent Labs  Lab 04/08/21 2243  INR 2.6*   Cardiac Enzymes: No results for input(s): CKTOTAL, CKMB, CKMBINDEX, TROPONINI in the last 168 hours. BNP (last 3 results) No results for input(s): PROBNP in the last 8760 hours. HbA1C: No results for input(s): HGBA1C in the last 72 hours. CBG: No results for input(s): GLUCAP in the last 168 hours. Lipid Profile: No results for input(s): CHOL, HDL, LDLCALC, TRIG, CHOLHDL, LDLDIRECT in the last 72 hours. Thyroid Function Tests: No results for input(s): TSH, T4TOTAL, FREET4, T3FREE, THYROIDAB in the last 72 hours. Anemia Panel: No results for input(s): VITAMINB12, FOLATE, FERRITIN, TIBC, IRON, RETICCTPCT in the last 72 hours. Urine analysis:    Component Value Date/Time   COLORURINE YELLOW 04/08/2021 2046   APPEARANCEUR CLEAR 04/08/2021 2046   LABSPEC 1.006 04/08/2021 2046   PHURINE 5.0 04/08/2021 2046   GLUCOSEU NEGATIVE 04/08/2021 2046    HGBUR SMALL (A) 04/08/2021 2046   BILIRUBINUR NEGATIVE 04/08/2021 2046   KETONESUR NEGATIVE 04/08/2021 2046   PROTEINUR NEGATIVE 04/08/2021 2046   UROBILINOGEN 0.2 04/05/2015 2245   NITRITE NEGATIVE 04/08/2021 2046   LEUKOCYTESUR NEGATIVE 04/08/2021 2046   Sepsis Labs: @LABRCNTIP (procalcitonin:4,lacticidven:4) )No results found for this or any previous visit (from the past 240 hour(s)).   Radiological Exams on Admission: CT ABDOMEN PELVIS W CONTRAST  Result Date: 04/08/2021 CLINICAL DATA:  Acute abdominal pain for several days EXAM: CT ABDOMEN AND PELVIS WITH CONTRAST TECHNIQUE: Multidetector CT imaging of the abdomen and pelvis was performed using the standard protocol following bolus administration of intravenous contrast. CONTRAST:  15mL OMNIPAQUE IOHEXOL 300 MG/ML  SOLN COMPARISON:  07/14/2017 FINDINGS: Lower chest: No acute abnormality. Hepatobiliary: Gallbladder has been surgically removed. The liver is within normal limits with the exception of significant biliary ductal dilatation. This continues to the level of the ampulla. Focal density is noted in the distal common bile duct suspicious for choledocholithiasis. Pancreas: Pancreas demonstrates diffuse fatty infiltration. Spleen: Normal in size without focal abnormality. Adrenals/Urinary Tract: Adrenal glands are unremarkable. Kidneys demonstrate no renal calculi. No obstructive changes are seen. Parapelvic cysts are noted bilaterally. The ureters are within normal limits bilaterally. The bladder is decompressed. Stomach/Bowel: Scattered diverticular change of the colon is noted without evidence of definitive diverticulitis. No obstructive or inflammatory changes of the colon are seen. The appendix has been surgically removed. Small bowel and stomach appear within normal limits. Vascular/Lymphatic: Aortic atherosclerosis. No enlarged abdominal or pelvic lymph nodes. Reproductive: Status post hysterectomy. No adnexal masses. Other: Small fat  containing umbilical hernia is noted. No free fluid is noted. Musculoskeletal: Postsurgical changes are noted in the proximal femurs. Degenerative change of the lumbar spine is noted. Mild anterolisthesis of L4 on L5 is seen. IMPRESSION: Significant intrahepatic and extrahepatic biliary ductal dilatation. On the coronal reconstructions there are findings suspicious for distal choledocholithiasis. The degree of dilatation is similar to that seen in 2018. Need for further workup can be determined on a clinical basis. Diverticulosis without diverticulitis. No other acute abnormality is noted. Electronically Signed   By: Inez Catalina M.D.   On: 04/08/2021 22:48    EKG: Independently reviewed. Atrial fibrillation,  rate 108.   Assessment/Plan   1. Acute cholangitis  - Pt presents with 4 days of right-sided abdominal pain, nausea, and now chills, and is found to have fever and jaundice  - CT suspicious for choledocholithiasis though biliary dilatation is similar to 2018  - She was started on IVF and Zosyn in ED  - Culture blood, keep NPO, continue Zosyn, check MRCP, consult GI    2. Atrial fibrillation with RVR  - Patient with hx of a fib on Xarelto had HR 170s in triage  - HR low 100s on admission after IVF and analgesia  - Hold anticoagulation pending MRCP and GI consultation, treat rate as needed    3. Insulin-dependent DM; hypoglycemia  - Serum glucose is 60 in ED; A1c was 7.4% in October 2021  - Hold glipizide, check CBGs, treat hypoglycemia as needed, use low-intensity SSI as needed    4. CKD IIIb  - SCr is 1.52 on admission, similar to priors  - Renally-dose medications, monitor   5. COPD  - No cough or wheezing on admission  - Albuterol as needed   6. Acute hypoxic respiratory failure  - She is saturating mid-90s in ED on 4 Lpm, states she does not normally require supplemental O2 - She denies SOB  - Check CXR, continue as-needed supplemental O2     DVT prophylaxis: SCDs for  now Code Status: DNR, confirmed with patient and her daughter   Level of Care: Level of care: Stepdown Family Communication: Daughter and granddaughter updated in person  Disposition Plan:  Patient is from: Home  Anticipated d/c is to: TBD Anticipated d/c date is: 04/13/21 Patient currently: Pending MRCP  Consults called: none  Admission status: Inpatient     Vianne Bulls, MD [facility]  04/08/2021, 11:58 PM

## 2021-04-09 ENCOUNTER — Inpatient Hospital Stay (HOSPITAL_COMMUNITY): Payer: Medicare Other

## 2021-04-09 DIAGNOSIS — J9601 Acute respiratory failure with hypoxia: Secondary | ICD-10-CM | POA: Insufficient documentation

## 2021-04-09 DIAGNOSIS — K8309 Other cholangitis: Secondary | ICD-10-CM

## 2021-04-09 DIAGNOSIS — A419 Sepsis, unspecified organism: Secondary | ICD-10-CM | POA: Insufficient documentation

## 2021-04-09 DIAGNOSIS — I4891 Unspecified atrial fibrillation: Secondary | ICD-10-CM

## 2021-04-09 LAB — GLUCOSE, CAPILLARY
Glucose-Capillary: 189 mg/dL — ABNORMAL HIGH (ref 70–99)
Glucose-Capillary: 50 mg/dL — ABNORMAL LOW (ref 70–99)
Glucose-Capillary: 156 mg/dL — ABNORMAL HIGH (ref 70–99)
Glucose-Capillary: 156 mg/dL — ABNORMAL HIGH (ref 70–99)

## 2021-04-09 LAB — COMPREHENSIVE METABOLIC PANEL
ALT: 46 U/L — ABNORMAL HIGH (ref 0–44)
AST: 62 U/L — ABNORMAL HIGH (ref 15–41)
Albumin: 2.7 g/dL — ABNORMAL LOW (ref 3.5–5.0)
Alkaline Phosphatase: 282 U/L — ABNORMAL HIGH (ref 38–126)
Anion gap: 10 (ref 5–15)
BUN: 22 mg/dL (ref 8–23)
CO2: 25 mmol/L (ref 22–32)
Calcium: 7.6 mg/dL — ABNORMAL LOW (ref 8.9–10.3)
Chloride: 99 mmol/L (ref 98–111)
Creatinine, Ser: 1.38 mg/dL — ABNORMAL HIGH (ref 0.44–1.00)
GFR, Estimated: 36 mL/min — ABNORMAL LOW (ref >60)
Glucose, Bld: 106 mg/dL — ABNORMAL HIGH (ref 70–99)
Potassium: 4 mmol/L (ref 3.5–5.1)
Sodium: 134 mmol/L — ABNORMAL LOW (ref 135–145)
Total Bilirubin: 4.4 mg/dL — ABNORMAL HIGH (ref 0.3–1.2)
Total Protein: 5.9 g/dL — ABNORMAL LOW (ref 6.5–8.1)

## 2021-04-09 LAB — CBC
HCT: 34.9 % — ABNORMAL LOW (ref 36.0–46.0)
Hemoglobin: 11.6 g/dL — ABNORMAL LOW (ref 12.0–15.0)
MCH: 29.6 pg (ref 26.0–34.0)
MCHC: 33.2 g/dL (ref 30.0–36.0)
MCV: 89 fL (ref 80.0–100.0)
Platelets: 354 10*3/uL (ref 150–400)
RBC: 3.92 MIL/uL (ref 3.87–5.11)
RDW: 15.4 % (ref 11.5–15.5)
WBC: 13.8 10*3/uL — ABNORMAL HIGH (ref 4.0–10.5)
nRBC: 0 % (ref 0.0–0.2)

## 2021-04-09 LAB — HEMOGLOBIN A1C
Hgb A1c MFr Bld: 8.2 % — ABNORMAL HIGH (ref 4.8–5.6)
Mean Plasma Glucose: 188.64 mg/dL

## 2021-04-09 LAB — RESP PANEL BY RT-PCR (FLU A&B, COVID) ARPGX2
Influenza A by PCR: NEGATIVE
Influenza B by PCR: NEGATIVE
SARS Coronavirus 2 by RT PCR: NEGATIVE

## 2021-04-09 LAB — CBG MONITORING, ED
Glucose-Capillary: 104 mg/dL — ABNORMAL HIGH (ref 70–99)
Glucose-Capillary: 108 mg/dL — ABNORMAL HIGH (ref 70–99)
Glucose-Capillary: 167 mg/dL — ABNORMAL HIGH (ref 70–99)
Glucose-Capillary: 169 mg/dL — ABNORMAL HIGH (ref 70–99)
Glucose-Capillary: 40 mg/dL — CL (ref 70–99)
Glucose-Capillary: 54 mg/dL — ABNORMAL LOW (ref 70–99)
Glucose-Capillary: 64 mg/dL — ABNORMAL LOW (ref 70–99)

## 2021-04-09 LAB — MRSA NEXT GEN BY PCR, NASAL: MRSA by PCR Next Gen: NOT DETECTED

## 2021-04-09 LAB — MAGNESIUM: Magnesium: 1.3 mg/dL — ABNORMAL LOW (ref 1.7–2.4)

## 2021-04-09 LAB — PROTIME-INR
INR: 2.3 — ABNORMAL HIGH (ref 0.8–1.2)
Prothrombin Time: 25.7 seconds — ABNORMAL HIGH (ref 11.4–15.2)

## 2021-04-09 LAB — D-DIMER, QUANTITATIVE: D-Dimer, Quant: 0.95 ug/mL-FEU — ABNORMAL HIGH (ref 0.00–0.50)

## 2021-04-09 MED ORDER — HYDROMORPHONE HCL 1 MG/ML IJ SOLN
0.5000 mg | INTRAMUSCULAR | Status: DC | PRN
  Administered 2021-04-09 – 2021-04-10 (×4): 0.5 mg via INTRAVENOUS
  Filled 2021-04-09 (×4): qty 0.5
  Filled 2021-04-09: qty 1

## 2021-04-09 MED ORDER — DEXTROSE 50 % IV SOLN: 1.0000 | Freq: Once | INTRAVENOUS | Status: AC

## 2021-04-09 MED ORDER — DEXTROSE 50 % IV SOLN
INTRAVENOUS | Status: AC
  Administered 2021-04-09: 50 mL via INTRAVENOUS
  Filled 2021-04-09: qty 50

## 2021-04-09 MED ORDER — DEXTROSE 50 % IV SOLN
1.0000 | Freq: Once | INTRAVENOUS | Status: AC
  Administered 2021-04-09: 50 mL via INTRAVENOUS

## 2021-04-09 MED ORDER — DEXTROSE 50 % IV SOLN
1.0000 | Freq: Once | INTRAVENOUS | Status: AC
  Administered 2021-04-09: 50 mL via INTRAVENOUS
  Filled 2021-04-09: qty 50

## 2021-04-09 MED ORDER — DEXTROSE 50 % IV SOLN
INTRAVENOUS | Status: AC
  Filled 2021-04-09: qty 50

## 2021-04-09 MED ORDER — DEXTROSE-NACL 5-0.9 % IV SOLN: INTRAVENOUS | Status: DC

## 2021-04-09 MED ORDER — CHLORHEXIDINE GLUCONATE CLOTH 2 % EX PADS
6.0000 | MEDICATED_PAD | Freq: Every day | CUTANEOUS | Status: DC
  Administered 2021-04-09 – 2021-04-11 (×3): 6 via TOPICAL

## 2021-04-09 MED ORDER — PIPERACILLIN-TAZOBACTAM 3.375 G IVPB
3.3750 g | Freq: Three times a day (TID) | INTRAVENOUS | Status: DC
  Administered 2021-04-09 – 2021-04-11 (×7): 3.375 g via INTRAVENOUS
  Filled 2021-04-09 (×7): qty 50

## 2021-04-09 NOTE — Progress Notes (Signed)
Pharmacy Antibiotic Note  [name] is a 87 y.o. female admitted on 04/08/2021 with  intra-abdominal infection .  Pharmacy has been consulted for Zosyn dosing. WBC elevated. Noted renal dysfunction.   Plan: Zosyn 3.375G IV q8h to be infused over 4 hours Trend WBC, temp, renal function  F/U infectious work-up   Height: 5\' 2"  (157.5 cm) Weight: 71.2 kg (157 lb) IBW/kg (Calculated) : 50.1  Temp (24hrs), Avg:101.8 F (38.8 C), Min:101.8 F (38.8 C), Max:101.8 F (38.8 C)  Recent Labs  Lab 04/08/21 2041 04/08/21 2243  WBC 14.5*  --   CREATININE 1.52*  --   LATICACIDVEN  --  1.0    Estimated Creatinine Clearance: 22.7 mL/min (A) (by C-G formula based on SCr of 1.52 mg/dL (H)).    Allergies  Allergen Reactions   Hydromorphone Hcl Other (See Comments)    Patient goes out of right state of mind.     Narda Bonds, PharmD, BCPS Clinical Pharmacist Phone: [phone]

## 2021-04-09 NOTE — ED Notes (Signed)
Pt in bed with eyes open family at bedside, md notified of pt's blood sugar, d50 given. Iv has blood return and flush without pain.

## 2021-04-09 NOTE — Consult Note (Signed)
Consulting  Provider: Dr. Myna Hidalgo Primary Care Physician:  Celene Squibb, MD Primary Gastroenterologist:  Dr. Gala Romney  Reason for Consultation: Cholangitis  HPI:  [name] is a 87 y.o. female with a past medical history of COPD, CKD, atrial fibrillation on Xarelto, diabetes, choledocholithiasis, history of cholecystectomy, who presented to [facility] ER yesterday evening due to abdominal pain and fever.  Patient states she had abdominal pain in her right upper quadrant which started 4 days ago.  She presented to her PCP who thought this was due to constipation.  She completed multiple doses of MiraLAX without improvement in her symptoms.  Also notes fever of up to 104.  No melena hematochezia.  Does note nausea.  In the ER, patient was found to be febrile, not tachycardic, stable blood pressure.  Blood work showed alk phos 349, AST 9.7, ALT 63, T bili 3.8 as well as leukocytosis of 14,500.  CT abdomen pelvis showed significant intra and extrahepatic biliary ductal dilatation and findings suspicious for distal CBD choledocholithiasis.  Patient was started on IV Zosyn and given IV fluids.  She had a similar presentation November 2018 with subsequent ERCP showing biliary papillary stenosis, stone and sludge in the common bile duct status post sphincterotomy and balloon extraction.   Past Medical History:  Diagnosis Date   Acid reflux    Anxiety    CKD (chronic kidney disease)    COPD (chronic obstructive pulmonary disease) ([place])    Depressed    Diabetes mellitus    Glaucoma    Hypercholesteremia    Hypertension    Legally blind in right eye, as defined in [place]    Panic attacks    Type 2 diabetes mellitus ([place])     Past Surgical History:  Procedure Laterality Date   APPENDECTOMY     BALLOON DILATION  07/16/2017   Procedure: BALLOON DILATION;  Surgeon: Danie Binder, MD;  [facility];  Service: Endoscopy;;   cataracts     CHOLECYSTECTOMY     ERCP N/A 07/16/2017    biliary papillary stenosis, filling defect c/w a stone and sludge, entire biliary tree dilated. completed biliary sphincterotomy and balloon extraction, stone removal   ESOPHAGOGASTRODUODENOSCOPY  2006   Dr. Gala Romney: normal esophagus s/p empiric dilataton, small hiatal hernia   HIP SURGERY     rod in leg as well   INTRAMEDULLARY (IM) NAIL INTERTROCHANTERIC Left 06/13/2020   Procedure: INTRAMEDULLARY (IM) NAIL INTERTROCHANTRIC;  Surgeon: [name], MD;  Location: [facility];  Service: Orthopedics;  Laterality: Left;   MASS EXCISION Left 09/26/2017   Procedure: EXCISION MUCOID CYST WITH DISTAL INTERPHALANGEAL JOINT ARTHROTOMY OF LEFT MIDDLE FINGER;  Surgeon: Leanora Cover, MD;  Location: [facility];  Service: Orthopedics;  Laterality: Left;   SPHINCTEROTOMY  07/16/2017   Procedure: SPHINCTEROTOMY;  Surgeon: Danie Binder, MD;  [facility];  Service: Endoscopy;;   TUBAL LIGATION      Prior to Admission medications   Medication Sig Start Date End Date Taking? Authorizing Provider  amLODipine (NORVASC) 5 MG tablet Take 5 mg by mouth daily.    [provider]  Difluprednate (DUREZOL) 0.05 % EMUL Apply 1 drop to eye daily.    [provider]  furosemide (LASIX) 20 MG tablet Take 20 mg daily as needed by mouth for fluid.    [provider]  gabapentin (NEURONTIN) 100 MG capsule TAKE 1 CAPSULE BY MOUTH THREE TIMES A DAY 03/20/21   Carole Civil,  MD  glipiZIDE (GLUCOTROL) 5 MG tablet Take 5 mg by mouth 2 (two) times daily.    [provider]  HYDROcodone-acetaminophen (NORCO) 5-325 MG tablet Take 1-2 tablets by mouth every 6 (six) hours as needed for moderate pain or severe pain. Patient not taking: Reported on 08/17/2020 06/16/20   Riesa Pope, MD  LOKELMA 10 g PACK packet Take 1 packet by mouth daily. 03/17/20   [provider]  omeprazole (PRILOSEC) 20 MG capsule Take 20 mg by mouth daily.    [provider]   ondansetron (ZOFRAN) 4 MG tablet Take 4 mg by mouth every 8 (eight) hours as needed for nausea/vomiting. 05/13/20   [provider]  pravastatin (PRAVACHOL) 40 MG tablet Take 40 mg by mouth daily.    [provider]  traMADol (ULTRAM) 50 MG tablet Take 1 tablet (50 mg total) by mouth every 12 (twelve) hours as needed for severe pain. 10/13/19   Wurst, [place], PA-C  TRESIBA FLEXTOUCH 100 UNIT/ML FlexTouch Pen Inject 10 Units into the skin daily.  12/08/19   [provider]  XARELTO 15 MG TABS tablet Take 15 mg by mouth daily. 12/01/19   [provider]    Current Facility-Administered Medications  Medication Dose Route Frequency Provider Last Rate Last Admin   acetaminophen (TYLENOL) tablet 650 mg  650 mg Oral Q6H PRN Opyd, Ilene Qua, MD       Or   acetaminophen (TYLENOL) suppository 650 mg  650 mg Rectal Q6H PRN Opyd, Ilene Qua, MD       albuterol (VENTOLIN HFA) 108 (90 Base) MCG/ACT inhaler 2 puff  2 puff Inhalation Q4H PRN Opyd, Ilene Qua, MD       dextrose 5 %-0.9 % sodium chloride infusion   Intravenous Continuous Tat, David, MD 100 mL/hr at 04/09/21 1144 New Bag at 04/09/21 1144   HYDROmorphone (DILAUDID) injection 0.5 mg  0.5 mg Intravenous Q3H PRN Opyd, Ilene Qua, MD       insulin aspart (novoLOG) injection 0-9 Units  0-9 Units Subcutaneous Q4H Opyd, Ilene Qua, MD       ondansetron (ZOFRAN) tablet 4 mg  4 mg Oral Q6H PRN Opyd, Ilene Qua, MD       Or   ondansetron (ZOFRAN) injection 4 mg  4 mg Intravenous Q6H PRN Opyd, Ilene Qua, MD       piperacillin-tazobactam (ZOSYN) IVPB 3.375 g  3.375 g Intravenous Q8H Erenest Blank, RPH 12.5 mL/hr at 04/09/21 0514 3.375 g at 04/09/21 4665   Current Outpatient Medications  Medication Sig Dispense Refill   amLODipine (NORVASC) 5 MG tablet Take 5 mg by mouth daily.     Difluprednate (DUREZOL) 0.05 % EMUL Apply 1 drop to eye daily.     furosemide (LASIX) 20 MG tablet Take 20 mg daily as needed by mouth for fluid.      gabapentin (NEURONTIN) 100 MG capsule TAKE 1 CAPSULE BY MOUTH THREE TIMES A DAY 60 capsule 5   glipiZIDE (GLUCOTROL) 5 MG tablet Take 5 mg by mouth 2 (two) times daily.     HYDROcodone-acetaminophen (NORCO) 5-325 MG tablet Take 1-2 tablets by mouth every 6 (six) hours as needed for moderate pain or severe pain. (Patient not taking: Reported on 08/17/2020) 40 tablet 0   LOKELMA 10 g PACK packet Take 1 packet by mouth daily.     omeprazole (PRILOSEC) 20 MG capsule Take 20 mg by mouth daily.     ondansetron (ZOFRAN) 4 MG tablet Take 4  mg by mouth every 8 (eight) hours as needed for nausea/vomiting.     pravastatin (PRAVACHOL) 40 MG tablet Take 40 mg by mouth daily.     traMADol (ULTRAM) 50 MG tablet Take 1 tablet (50 mg total) by mouth every 12 (twelve) hours as needed for severe pain. 15 tablet 0   TRESIBA FLEXTOUCH 100 UNIT/ML FlexTouch Pen Inject 10 Units into the skin daily.      XARELTO 15 MG TABS tablet Take 15 mg by mouth daily.      Allergies as of 04/08/2021 - Review Complete 04/08/2021  Allergen Reaction Noted   Hydromorphone hcl Other (See Comments)     Family History  Problem Relation Age of Onset   Blindness Father    Bone cancer Father    Brain cancer Sister    Alzheimer's disease Brother    Colon cancer Neg Hx     Social History   Socioeconomic History   Marital status: Divorced    Spouse name: Not on file   Number of children: Not on file   Years of education: Not on file   Highest education level: Not on file  Occupational History   Not on file  Tobacco Use   Smoking status: Never   Smokeless tobacco: Never  Vaping Use   Vaping Use: Never used  Substance and Sexual Activity   Alcohol use: No   Drug use: No   Sexual activity: Never  Other Topics Concern   Not on file  Social History Narrative   Not on file   Social Determinants of Health   Financial Resource Strain: Not on file  Food Insecurity: Not on file  Transportation Needs: Not on file   Physical Activity: Not on file  Stress: Not on file  Social Connections: Not on file  Intimate Partner Violence: Not on file    Review of Systems: General: Negative for anorexia, weight loss, fever, chills, fatigue, weakness. Eyes: Negative for vision changes.  ENT: Negative for hoarseness, difficulty swallowing , nasal congestion. CV: Negative for chest pain, angina, palpitations, dyspnea on exertion, peripheral edema.  Respiratory: Negative for dyspnea at rest, dyspnea on exertion, cough, sputum, wheezing.  GI: See history of present illness. GU:  Negative for dysuria, hematuria, urinary incontinence, urinary frequency, nocturnal urination.  MS: Negative for joint pain, low back pain.  Derm: Negative for rash or itching.  Neuro: Negative for weakness, abnormal sensation, seizure, frequent headaches, memory loss, confusion.  Psych: Negative for anxiety, depression Endo: Negative for unusual weight change.  Heme: Negative for bruising or bleeding. Allergy: Negative for rash or hives.  Physical Exam: Vital signs in last 24 hours: Temp:  [97.8 F (36.6 C)-101.8 F (38.8 C)] 97.9 F (36.6 C) (08/14 1105) Pulse Rate:  [93-117] 93 (08/14 1130) Resp:  [15-26] 20 (08/14 1130) BP: (93-151)/(63-89) 102/63 (08/14 1130) SpO2:  [87 %-100 %] 95 % (08/14 1130) Weight:  [71.2 kg] 71.2 kg (08/13 2019)   General:   Alert,  Well-developed, well-nourished, pleasant and cooperative in NAD Head:  Normocephalic and atraumatic. Eyes:  Sclera clear, no icterus.   Conjunctiva pink. Ears:  Normal auditory acuity. Nose:  No deformity, discharge,  or lesions. Mouth:  No deformity or lesions, dentition normal. Neck:  Supple; no masses or thyromegaly. Lungs:  Clear throughout to auscultation.   No wheezes, crackles, or rhonchi. No acute distress. Heart: Irregular rate and rhythm; no murmurs, clicks, rubs,  or gallops. Abdomen:  Soft, tender to palpation right upper quadrant and  nondistended. No  masses, hepatosplenomegaly or hernias noted. Normal bowel sounds, without guarding, and without rebound.   Msk:  Symmetrical without gross deformities. Normal posture. Pulses:  Normal pulses noted. Extremities:  Without clubbing or edema. Neurologic:  Alert and  oriented x4;  grossly normal neurologically. Skin:  Intact without significant lesions or rashes. Cervical Nodes:  No significant cervical adenopathy. Psych:  Alert and cooperative. Normal mood and affect.  Intake/Output from previous day: 08/13 0701 - 08/14 0700 In: 100 [IV Piggyback:100] Out: -  Intake/Output this shift: Total I/O In: -  Out: 700 [Urine:700]  Lab Results: Recent Labs    04/08/21 2041 04/09/21 0752  WBC 14.5* 13.8*  HGB 13.1 11.6*  HCT 39.9 34.9*  PLT 420* 354   BMET Recent Labs    04/08/21 2041 04/09/21 0752  NA 132* 134*  K 4.1 4.0  CL 96* 99  CO2 25 25  GLUCOSE 60* 106*  BUN 26* 22  CREATININE 1.52* 1.38*  CALCIUM 8.5* 7.6*   LFT Recent Labs    04/08/21 2041 04/09/21 0752  PROT 7.2 5.9*  ALBUMIN 3.5 2.7*  AST 97* 62*  ALT 63* 46*  ALKPHOS 349* 282*  BILITOT 3.8* 4.4*   PT/INR Recent Labs    04/08/21 2243 04/09/21 0524  LABPROT 28.2* 25.7*  INR 2.6* 2.3*   Hepatitis Panel No results for input(s): HEPBSAG, HCVAB, HEPAIGM, HEPBIGM in the last 72 hours. C-Diff No results for input(s): CDIFFTOX in the last 72 hours.  Studies/Results: CT ABDOMEN PELVIS W CONTRAST  Result Date: 04/08/2021 CLINICAL DATA:  Acute abdominal pain for several days EXAM: CT ABDOMEN AND PELVIS WITH CONTRAST TECHNIQUE: Multidetector CT imaging of the abdomen and pelvis was performed using the standard protocol following bolus administration of intravenous contrast. CONTRAST:  14m OMNIPAQUE IOHEXOL 300 MG/ML  SOLN COMPARISON:  07/14/2017 FINDINGS: Lower chest: No acute abnormality. Hepatobiliary: Gallbladder has been surgically removed. The liver is within normal limits with the exception of  significant biliary ductal dilatation. This continues to the level of the ampulla. Focal density is noted in the distal common bile duct suspicious for choledocholithiasis. Pancreas: Pancreas demonstrates diffuse fatty infiltration. Spleen: Normal in size without focal abnormality. Adrenals/Urinary Tract: Adrenal glands are unremarkable. Kidneys demonstrate no renal calculi. No obstructive changes are seen. Parapelvic cysts are noted bilaterally. The ureters are within normal limits bilaterally. The bladder is decompressed. Stomach/Bowel: Scattered diverticular change of the colon is noted without evidence of definitive diverticulitis. No obstructive or inflammatory changes of the colon are seen. The appendix has been surgically removed. Small bowel and stomach appear within normal limits. Vascular/Lymphatic: Aortic atherosclerosis. No enlarged abdominal or pelvic lymph nodes. Reproductive: Status post hysterectomy. No adnexal masses. Other: Small fat containing umbilical hernia is noted. No free fluid is noted. Musculoskeletal: Postsurgical changes are noted in the proximal femurs. Degenerative change of the lumbar spine is noted. Mild anterolisthesis of L4 on L5 is seen. IMPRESSION: Significant intrahepatic and extrahepatic biliary ductal dilatation. On the coronal reconstructions there are findings suspicious for distal choledocholithiasis. The degree of dilatation is similar to that seen in 2018. Need for further workup can be determined on a clinical basis. Diverticulosis without diverticulitis. No other acute abnormality is noted. Electronically Signed   By: MInez CatalinaM.D.   On: 04/08/2021 22:48   DG CHEST PORT 1 VIEW  Result Date: 04/09/2021 CLINICAL DATA:  Respiratory failure and hypoxia EXAM: PORTABLE CHEST 1 VIEW COMPARISON:  06/12/2020 FINDINGS: Cardiac shadow is enlarged. Aortic calcifications are  noted as well as tortuosity stable from the prior exam. No focal infiltrate or sizable effusion is  seen. No acute bony abnormality is noted. IMPRESSION: No acute abnormality noted. Electronically Signed   By: Inez Catalina M.D.   On: 04/09/2021 00:29    Impression: *Acute cholangitis in setting of choledocholithiasis *Sepsis due to above *Abdominal pain  Plan: Discussed case with Dr. Laural Golden, will plan on ERCP with or without stent placement tomorrow.  This may need to be delayed until Tuesday pending her last Xarelto dose.  Continue on IV Zosyn.  Clear liquids okay today, n.p.o. after midnight.  Continue to hold Xarelto as patient will likely need sphincterotomy.  GI to continue to follow   Elon Alas. Abbey Chatters, D.O. Gastroenterology and Hepatology [facility]    LOS: 1 day     04/09/2021, 12:30 PM

## 2021-04-09 NOTE — Progress Notes (Signed)
PROGRESS NOTE  [name] GGY:[id number] DOB: 05-Sep-1928 DOA: 04/08/2021 PCP: Celene Squibb, MD  Brief History:  87 year old female with a history of COPD, CKD stage IIIb, atrial fibrillation, diabetes mellitus type 2, hypertension, hyperlipidemia, anxiety presenting with 3 to 4-day history of upper abdominal pain, chills and rigors, and nausea.  The patient is a difficult historian.  History is supplemented by the patient's daughter at the bedside.  Apparently, the patient had a fever up to 104.0 F on 04/05/2021.  She was taken to her PCP.  The patient was told to take MiraLAX daily.  She did not have any bowel movements.  She continued to have abdominal pain that gradually worsened.  She continues to have rigors.  She had a poor appetite and nausea.  She has not had any headache, neck pain, chest pain, diarrhea, dysuria, hematuria, hematochezia, melena. Notably, the patient has had some intermittent coughing and shortness of breath, primarily with exertion.  Daughter states that the patient had COVID-19 on March 09, 2021.  Apparently, she was treated with some type of "blue pill" by her primary care provider.  She did not require hospitalization.  Since her COVID diagnosis in July, the patient has had some intermittent shortness of breath and coughing.  She states this has not significantly worsened. In the emergency department, the patient was febrile up to 101.8 F.  She had atrial fibrillation with RVR with heart rate 108.  She was hemodynamically stable.  Initially oxygen saturation was in the mid 80s.  The patient was placed on 4 L with saturation up to 98%.  She was started IV fluids and IV Zosyn.  Assessment/Plan: Sepsis -Present on admission -Patient presented with fever, tachycardia, and leukocytosis -Secondary to biliary/intra-abdominal source -Lactic acid 1.0 -UA neg of pyuria -follow blood culture -Continue Zosyn -Continue IV  fluids  Transaminasemia/Cholangitis -04/08/2021 CT abd/pelvis--intrahepatic and extrahepatic ductal dilatation the degree of which was similar to 2018.  Focal density at the distal CBD suspicious for possible choledocholithiasis.  No hydronephrosis  -GI consult -Remain n.p.o. -Continue Zosyn -07/16/2017 ERCP--biliary papillary stenosis; biliary defect consistent with stone & slugde; choledocholithiasis noted on cholangiogram; sphincterotomy and balloon extraction performed  Atrial fibrillation with RVR, type unspecified -Holding rivaroxaban in anticipation for GI procedure -Initially presented with heart rate 100-110 -Currently rate controlled  CKD stage IIIb -Baseline creatinine 1.4-1.6  Acute respiratory failure with hypoxia -Etiology unclear -Personally reviewed chest x-ray--no edema or consolidation -Initially placed on 4 L -Currently stable on 2 L nasal cannula--saturation 100%  Diabetes mellitus type 2 -NovoLog sliding scale -Holding Tresiba temporarily -Holding glipizide -Check A1c  Essential hypertension -Holding amlodipine  Hyperlipidemia -Holding statin temporarily     Status is: Inpatient  Remains inpatient appropriate because:Inpatient level of care appropriate due to severity of illness  Dispo: The patient is from: Home              Anticipated d/c is to: Home              Patient currently is not medically stable to d/c.   Difficult to place patient No        Family Communication:   Daughter updated at bedside 8/14  Consultants:  GI  Code Status:  DNR  DVT Prophylaxis:  Xarelto on hold   Procedures: As Listed in Progress Note Above  Antibiotics: Zosyn 8/13>>      Subjective: Patient states that her abdominal pain is slightly better  than yesterday.  She has some nausea.  She denies any emesis.  There is no headache, chest pain, shortness of breath.  She has nonproductive cough.  There is no vomiting, diarrhea, hematochezia, melena,  dysuria, hematuria.  Objective: Vitals:   04/09/21 0030 04/09/21 0130 04/09/21 0400 04/09/21 0415  BP: 138/86 (!) 151/89 121/89   Pulse: 96 97 (!) 101 (!) 106  Resp: 19 (!) 21 18 (!) 22  Temp:      TempSrc:      SpO2: (!) 87% 95% 97% 95%  Weight:      Height:        Intake/Output Summary (Last 24 hours) at 04/09/2021 0726 Last data filed at 04/09/2021 0116 Gross per 24 hour  Intake 100 ml  Output --  Net 100 ml   Weight change:  Exam:  General:  Pt is alert, follows commands appropriately, not in acute distress HEENT: No icterus, No thrush, No neck mass, [place]/AT Cardiovascular: IRRR, S1/S2, no rubs, no gallops Respiratory: Bibasilar crackles.  Left lower to auscultation.  No wheezing. Abdomen: Soft/+BS, upper abdomen tender, non distended, no guarding Extremities: No edema, No lymphangitis, No petechiae, No rashes, no synovitis   Data Reviewed: I have personally reviewed following labs and imaging studies Basic Metabolic Panel: Recent Labs  Lab 04/08/21 2041  NA 132*  K 4.1  CL 96*  CO2 25  GLUCOSE 60*  BUN 26*  CREATININE 1.52*  CALCIUM 8.5*   Liver Function Tests: Recent Labs  Lab 04/08/21 2041  AST 97*  ALT 63*  ALKPHOS 349*  BILITOT 3.8*  PROT 7.2  ALBUMIN 3.5   Recent Labs  Lab 04/08/21 2243  LIPASE 18   No results for input(s): AMMONIA in the last 168 hours. Coagulation Profile: Recent Labs  Lab 04/08/21 2243 04/09/21 0524  INR 2.6* 2.3*   CBC: Recent Labs  Lab 04/08/21 2041  WBC 14.5*  NEUTROABS 13.7*  HGB 13.1  HCT 39.9  MCV 90.9  PLT 420*   Cardiac Enzymes: No results for input(s): CKTOTAL, CKMB, CKMBINDEX, TROPONINI in the last 168 hours. BNP: Invalid input(s): POCBNP CBG: Recent Labs  Lab 04/09/21 0007 04/09/21 0053 04/09/21 0423  GLUCAP 40* 167* 64*   HbA1C: No results for input(s): HGBA1C in the last 72 hours. Urine analysis:    Component Value Date/Time   COLORURINE YELLOW 04/08/2021 2046   APPEARANCEUR  CLEAR 04/08/2021 2046   LABSPEC 1.006 04/08/2021 2046   PHURINE 5.0 04/08/2021 2046   GLUCOSEU NEGATIVE 04/08/2021 2046   HGBUR SMALL (A) 04/08/2021 2046   BILIRUBINUR NEGATIVE 04/08/2021 2046   KETONESUR NEGATIVE 04/08/2021 2046   PROTEINUR NEGATIVE 04/08/2021 2046   UROBILINOGEN 0.2 04/05/2015 2245   NITRITE NEGATIVE 04/08/2021 2046   LEUKOCYTESUR NEGATIVE 04/08/2021 2046   Sepsis Labs: @LABRCNTIP (procalcitonin:4,lacticidven:4) ) Recent Results (from the past 240 hour(s))  Resp Panel by RT-PCR (Flu A&B, Covid) Nasopharyngeal Swab     Status: None   Collection Time: 04/09/21  2:35 AM   Specimen: Nasopharyngeal Swab; Nasopharyngeal(NP) swabs in vial transport medium  Result Value Ref Range Status   SARS Coronavirus 2 by RT PCR NEGATIVE NEGATIVE Final    Comment: (NOTE) SARS-CoV-2 target nucleic acids are NOT DETECTED.  The SARS-CoV-2 RNA is generally detectable in upper respiratory specimens during the acute phase of infection. The lowest concentration of SARS-CoV-2 viral copies this assay can detect is 138 copies/mL. A negative result does not preclude SARS-Cov-2 infection and should not be used as the sole basis for  treatment or other patient management decisions. A negative result may occur with  improper specimen collection/handling, submission of specimen other than nasopharyngeal swab, presence of viral mutation(s) within the areas targeted by this assay, and inadequate number of viral copies(<138 copies/mL). A negative result must be combined with clinical observations, patient history, and epidemiological information. The expected result is Negative.  Fact Sheet for Patients:  EntrepreneurPulse.com.au  Fact Sheet for Healthcare Providers:  IncredibleEmployment.be  This test is no t yet approved or cleared by the [place] FDA and  has been authorized for detection and/or diagnosis of SARS-CoV-2 by FDA under an Emergency Use  Authorization (EUA). This EUA will remain  in effect (meaning this test can be used) for the duration of the COVID-19 declaration under Section 564(b)(1) of the Act, 21 U.S.C.section 360bbb-3(b)(1), unless the authorization is terminated  or revoked sooner.       Influenza A by PCR NEGATIVE NEGATIVE Final   Influenza B by PCR NEGATIVE NEGATIVE Final    Comment: (NOTE) The Xpert Xpress SARS-CoV-2/FLU/RSV plus assay is intended as an aid in the diagnosis of influenza from Nasopharyngeal swab specimens and should not be used as a sole basis for treatment. Nasal washings and aspirates are unacceptable for Xpert Xpress SARS-CoV-2/FLU/RSV testing.  Fact Sheet for Patients: EntrepreneurPulse.com.au  Fact Sheet for Healthcare Providers: IncredibleEmployment.be  This test is not yet approved or cleared by the [place] FDA and has been authorized for detection and/or diagnosis of SARS-CoV-2 by FDA under an Emergency Use Authorization (EUA). This EUA will remain in effect (meaning this test can be used) for the duration of the COVID-19 declaration under Section 564(b)(1) of the Act, 21 U.S.C. section 360bbb-3(b)(1), unless the authorization is terminated or revoked.  Performed at [facility], [address] [place], [place] [address]      Scheduled Meds:  insulin aspart  0-9 Units Subcutaneous Q4H   Continuous Infusions:  sodium chloride 250 mL/hr at 04/08/21 2258   sodium chloride 100 mL/hr at 04/09/21 0116   piperacillin-tazobactam (ZOSYN)  IV 3.375 g (04/09/21 0514)    Procedures/Studies: CT ABDOMEN PELVIS W CONTRAST  Result Date: 04/08/2021 CLINICAL DATA:  Acute abdominal pain for several days EXAM: CT ABDOMEN AND PELVIS WITH CONTRAST TECHNIQUE: Multidetector CT imaging of the abdomen and pelvis was performed using the standard protocol following bolus administration of intravenous contrast. CONTRAST:  78mL OMNIPAQUE IOHEXOL 300 MG/ML   SOLN COMPARISON:  07/14/2017 FINDINGS: Lower chest: No acute abnormality. Hepatobiliary: Gallbladder has been surgically removed. The liver is within normal limits with the exception of significant biliary ductal dilatation. This continues to the level of the ampulla. Focal density is noted in the distal common bile duct suspicious for choledocholithiasis. Pancreas: Pancreas demonstrates diffuse fatty infiltration. Spleen: Normal in size without focal abnormality. Adrenals/Urinary Tract: Adrenal glands are unremarkable. Kidneys demonstrate no renal calculi. No obstructive changes are seen. Parapelvic cysts are noted bilaterally. The ureters are within normal limits bilaterally. The bladder is decompressed. Stomach/Bowel: Scattered diverticular change of the colon is noted without evidence of definitive diverticulitis. No obstructive or inflammatory changes of the colon are seen. The appendix has been surgically removed. Small bowel and stomach appear within normal limits. Vascular/Lymphatic: Aortic atherosclerosis. No enlarged abdominal or pelvic lymph nodes. Reproductive: Status post hysterectomy. No adnexal masses. Other: Small fat containing umbilical hernia is noted. No free fluid is noted. Musculoskeletal: Postsurgical changes are noted in the proximal femurs. Degenerative change of the lumbar spine is noted. Mild anterolisthesis of L4  on L5 is seen. IMPRESSION: Significant intrahepatic and extrahepatic biliary ductal dilatation. On the coronal reconstructions there are findings suspicious for distal choledocholithiasis. The degree of dilatation is similar to that seen in 2018. Need for further workup can be determined on a clinical basis. Diverticulosis without diverticulitis. No other acute abnormality is noted. Electronically Signed   By: Inez Catalina M.D.   On: 04/08/2021 22:48   DG CHEST PORT 1 VIEW  Result Date: 04/09/2021 CLINICAL DATA:  Respiratory failure and hypoxia EXAM: PORTABLE CHEST 1 VIEW  COMPARISON:  06/12/2020 FINDINGS: Cardiac shadow is enlarged. Aortic calcifications are noted as well as tortuosity stable from the prior exam. No focal infiltrate or sizable effusion is seen. No acute bony abnormality is noted. IMPRESSION: No acute abnormality noted. Electronically Signed   By: Inez Catalina M.D.   On: 04/09/2021 00:29    Orson Eva, DO  [facility]  If 7PM-7AM, please contact night-coverage [email or web address] Password [email or web address] 04/09/2021, 7:26 AM   LOS: 1 day

## 2021-04-10 ENCOUNTER — Encounter (HOSPITAL_COMMUNITY): Payer: Self-pay | Admitting: Family Medicine

## 2021-04-10 ENCOUNTER — Inpatient Hospital Stay (HOSPITAL_COMMUNITY): Payer: Medicare Other | Admitting: Anesthesiology

## 2021-04-10 ENCOUNTER — Encounter (HOSPITAL_COMMUNITY)
Admission: EM | Disposition: A | Discharge status: Home Health | Payer: Self-pay | Source: Home / Self Care | Attending: Internal Medicine

## 2021-04-10 ENCOUNTER — Inpatient Hospital Stay (HOSPITAL_COMMUNITY): Payer: Medicare Other

## 2021-04-10 HISTORY — PX: SPHINCTEROTOMY: SHX5544

## 2021-04-10 HISTORY — PX: ERCP: SHX5425

## 2021-04-10 LAB — COMPREHENSIVE METABOLIC PANEL
ALT: 34 U/L (ref 0–44)
ALT: 34 U/L (ref 0–44)
AST: 41 U/L (ref 15–41)
AST: 37 U/L (ref 15–41)
Albumin: 2.4 g/dL — ABNORMAL LOW (ref 3.5–5.0)
Albumin: 2.6 g/dL — ABNORMAL LOW (ref 3.5–5.0)
Alkaline Phosphatase: 201 U/L — ABNORMAL HIGH (ref 38–126)
Alkaline Phosphatase: 228 U/L — ABNORMAL HIGH (ref 38–126)
Anion gap: 7 (ref 5–15)
Anion gap: 6 (ref 5–15)
BUN: 19 mg/dL (ref 8–23)
BUN: 16 mg/dL (ref 8–23)
CO2: 26 mmol/L (ref 22–32)
CO2: 26 mmol/L (ref 22–32)
Calcium: 7.4 mg/dL — ABNORMAL LOW (ref 8.9–10.3)
Calcium: 7.9 mg/dL — ABNORMAL LOW (ref 8.9–10.3)
Chloride: 103 mmol/L (ref 98–111)
Chloride: 107 mmol/L (ref 98–111)
Creatinine, Ser: 1.5 mg/dL — ABNORMAL HIGH (ref 0.44–1.00)
Creatinine, Ser: 1.35 mg/dL — ABNORMAL HIGH (ref 0.44–1.00)
GFR, Estimated: 33 mL/min — ABNORMAL LOW (ref >60)
GFR, Estimated: 37 mL/min — ABNORMAL LOW (ref >60)
Glucose, Bld: 140 mg/dL — ABNORMAL HIGH (ref 70–99)
Glucose, Bld: 137 mg/dL — ABNORMAL HIGH (ref 70–99)
Potassium: 3.3 mmol/L — ABNORMAL LOW (ref 3.5–5.1)
Potassium: 3.8 mmol/L (ref 3.5–5.1)
Sodium: 136 mmol/L (ref 135–145)
Sodium: 139 mmol/L (ref 135–145)
Total Bilirubin: 2.3 mg/dL — ABNORMAL HIGH (ref 0.3–1.2)
Total Bilirubin: 2.2 mg/dL — ABNORMAL HIGH (ref 0.3–1.2)
Total Protein: 5 g/dL — ABNORMAL LOW (ref 6.5–8.1)
Total Protein: 5.8 g/dL — ABNORMAL LOW (ref 6.5–8.1)

## 2021-04-10 LAB — CBC
HCT: 32.7 % — ABNORMAL LOW (ref 36.0–46.0)
Hemoglobin: 10.7 g/dL — ABNORMAL LOW (ref 12.0–15.0)
MCH: 30 pg (ref 26.0–34.0)
MCHC: 32.7 g/dL (ref 30.0–36.0)
MCV: 91.6 fL (ref 80.0–100.0)
Platelets: 306 10*3/uL (ref 150–400)
RBC: 3.57 MIL/uL — ABNORMAL LOW (ref 3.87–5.11)
RDW: 15.8 % — ABNORMAL HIGH (ref 11.5–15.5)
WBC: 7.1 10*3/uL (ref 4.0–10.5)
nRBC: 0 % (ref 0.0–0.2)

## 2021-04-10 LAB — GLUCOSE, CAPILLARY
Glucose-Capillary: 134 mg/dL — ABNORMAL HIGH (ref 70–99)
Glucose-Capillary: 165 mg/dL — ABNORMAL HIGH (ref 70–99)
Glucose-Capillary: 114 mg/dL — ABNORMAL HIGH (ref 70–99)
Glucose-Capillary: 116 mg/dL — ABNORMAL HIGH (ref 70–99)
Glucose-Capillary: 137 mg/dL — ABNORMAL HIGH (ref 70–99)
Glucose-Capillary: 135 mg/dL — ABNORMAL HIGH (ref 70–99)
Glucose-Capillary: 157 mg/dL — ABNORMAL HIGH (ref 70–99)
Glucose-Capillary: 219 mg/dL — ABNORMAL HIGH (ref 70–99)

## 2021-04-10 LAB — URINE CULTURE: Culture: <10000 — AB

## 2021-04-10 LAB — PROTIME-INR
INR: 1.4 — ABNORMAL HIGH (ref 0.8–1.2)
Prothrombin Time: 16.9 seconds — ABNORMAL HIGH (ref 11.4–15.2)

## 2021-04-10 SURGERY — ERCP, WITH INTERVENTION IF INDICATED
Anesthesia: General

## 2021-04-10 MED ORDER — OXYCODONE HCL 5 MG PO TABS
5.0000 mg | ORAL_TABLET | ORAL | Status: DC | PRN
  Administered 2021-04-10: 5 mg via ORAL
  Filled 2021-04-10: qty 1

## 2021-04-10 MED ORDER — DEXAMETHASONE SODIUM PHOSPHATE 4 MG/ML IJ SOLN
INTRAMUSCULAR | Status: DC | PRN
  Administered 2021-04-10: 4 mg via INTRAVENOUS

## 2021-04-10 MED ORDER — GLUCAGON HCL RDNA (DIAGNOSTIC) 1 MG IJ SOLR
INTRAMUSCULAR | Status: DC | PRN
  Administered 2021-04-10: .25 mg via INTRAVENOUS

## 2021-04-10 MED ORDER — GLUCAGON HCL RDNA (DIAGNOSTIC) 1 MG IJ SOLR
INTRAMUSCULAR | Status: AC
  Filled 2021-04-10: qty 1

## 2021-04-10 MED ORDER — FENTANYL CITRATE (PF) 100 MCG/2ML IJ SOLN
INTRAMUSCULAR | Status: DC | PRN
  Administered 2021-04-10: 50 ug via INTRAVENOUS

## 2021-04-10 MED ORDER — LACTATED RINGERS IV SOLN: INTRAVENOUS | Status: DC

## 2021-04-10 MED ORDER — PHENYLEPHRINE HCL (PRESSORS) 10 MG/ML IV SOLN
INTRAVENOUS | Status: DC | PRN
  Administered 2021-04-10: 60 ug via INTRAVENOUS

## 2021-04-10 MED ORDER — PROPOFOL 10 MG/ML IV BOLUS
INTRAVENOUS | Status: DC | PRN
  Administered 2021-04-10: 80 mg via INTRAVENOUS

## 2021-04-10 MED ORDER — SUCCINYLCHOLINE CHLORIDE 200 MG/10ML IV SOSY
PREFILLED_SYRINGE | INTRAVENOUS | Status: DC | PRN
  Administered 2021-04-10: 80 mg via INTRAVENOUS

## 2021-04-10 MED ORDER — MAGNESIUM SULFATE 2 GM/50ML IV SOLN
2.0000 g | Freq: Once | INTRAVENOUS | Status: AC
  Administered 2021-04-10: 2 g via INTRAVENOUS
  Filled 2021-04-10: qty 50

## 2021-04-10 MED ORDER — LIDOCAINE HCL (CARDIAC) PF 100 MG/5ML IV SOSY
PREFILLED_SYRINGE | INTRAVENOUS | Status: DC | PRN
Start: 2021-04-10 — End: 2021-04-10
  Administered 2021-04-10: 80 mg via INTRAVENOUS

## 2021-04-10 MED ORDER — DEXAMETHASONE SODIUM PHOSPHATE 4 MG/ML IJ SOLN
INTRAMUSCULAR | Status: AC
  Filled 2021-04-10: qty 1

## 2021-04-10 MED ORDER — XARELTO 15 MG PO TABS: 15.0000 mg | ORAL_TABLET | Freq: Every day | ORAL | Status: DC

## 2021-04-10 MED ORDER — ORAL CARE MOUTH RINSE: 15.0000 mL | Freq: Once | OROMUCOSAL | Status: AC

## 2021-04-10 MED ORDER — SODIUM CHLORIDE 0.9 % IV SOLN
INTRAVENOUS | Status: AC
  Filled 2021-04-10: qty 100

## 2021-04-10 MED ORDER — FENTANYL CITRATE (PF) 100 MCG/2ML IJ SOLN
INTRAMUSCULAR | Status: AC
  Filled 2021-04-10: qty 2

## 2021-04-10 MED ORDER — SODIUM CHLORIDE 0.9 % IV SOLN: INTRAVENOUS | Status: DC | PRN

## 2021-04-10 MED ORDER — CHLORHEXIDINE GLUCONATE 0.12 % MT SOLN
15.0000 mL | Freq: Once | OROMUCOSAL | Status: AC
  Administered 2021-04-10: 15 mL via OROMUCOSAL
  Filled 2021-04-10: qty 15

## 2021-04-10 MED ORDER — URSODIOL 300 MG PO CAPS
300.0000 mg | ORAL_CAPSULE | Freq: Two times a day (BID) | ORAL | Status: DC
  Administered 2021-04-10 – 2021-04-11 (×2): 300 mg via ORAL
  Filled 2021-04-10 (×5): qty 1

## 2021-04-10 SURGICAL SUPPLY — 25 items
BALLN RETRIEVAL 12X15 (BALLOONS) IMPLANT
BALN RTRVL 200 6-7FR 12-15 (BALLOONS)
BASKET TRAPEZOID 3X6 (MISCELLANEOUS) IMPLANT
BASKET TRAPEZOID LITHO 2.0X5 (MISCELLANEOUS) ×2 IMPLANT
BSKT STON RTRVL TRAPEZOID 2X5 (MISCELLANEOUS)
BSKT STON RTRVL TRAPEZOID 3X6 (MISCELLANEOUS)
DEVICE INFLATION ENCORE 26 (MISCELLANEOUS) IMPLANT
DEVICE LOCKING W-BIOPSY CAP (MISCELLANEOUS) ×2 IMPLANT
GUIDEWIRE HYDRA JAGWIRE .35 (WIRE) IMPLANT
GUIDEWIRE JAG HINI 025X260CM (WIRE) IMPLANT
KIT ENDO PROCEDURE PEN (KITS) ×3 IMPLANT
KIT TURNOVER KIT A (KITS) ×3 IMPLANT
LUBRICANT JELLY 4.5OZ STERILE (MISCELLANEOUS) IMPLANT
PAD ARMBOARD 7.5X6 YLW CONV (MISCELLANEOUS) ×3 IMPLANT
POSITIONER HEAD 8X9X4 ADT (SOFTGOODS) IMPLANT
SCOPE SPY DS DISPOSABLE (MISCELLANEOUS) ×2 IMPLANT
SNARE ROTATE MED OVAL 20MM (MISCELLANEOUS) IMPLANT
SNARE SHORT THROW 13M SML OVAL (MISCELLANEOUS) IMPLANT
SPHINCTEROTOME AUTOTOME .25 (MISCELLANEOUS) IMPLANT
SPHINCTEROTOME HYDRATOME 44 (MISCELLANEOUS) ×2 IMPLANT
SYSTEM CONTINUOUS INJECTION (MISCELLANEOUS) ×2 IMPLANT
TUBING INSUFFLATOR CO2MPACT (TUBING) ×2 IMPLANT
WALLSTENT METAL COVERED 10X60 (STENTS) IMPLANT
WALLSTENT METAL COVERED 10X80 (STENTS) IMPLANT
WATER STERILE IRR 1000ML POUR (IV SOLUTION) ×3 IMPLANT

## 2021-04-10 NOTE — Progress Notes (Signed)
Patient seen today at bedside with family present. Continues with intermittent RUQ abdominal pain in setting of cholangitis due to choledocholithiasis. Prior history of same in Nov 2018 s/p ERCP with papillary stenosis, stone and sludge in CBD s/p sphincterotomy and balloon extraction.   Transaminases now normal, bilirubin trending down (2.3, down from high of 4.4 yesterday), alk phos trendin gdown and now 201 (previously 349 on admission).  Last dose of Xarelto Saturday morning. Afebrile this morning.   Discussed with patient and family risks and benefits of ERCP with sphincterotomy, stent placement. Discussed risks of post-ERCP pancreatitis as well. All are agreeable.   Remain NPO. Continue with IV Zosyn every 8 hours. ERCP with possible sphincterotomy and stent placement for today.   Annitta Needs, PhD, ANP-BC [facility]

## 2021-04-10 NOTE — Op Note (Signed)
[facility] Patient Name: [name] Procedure Date: 04/10/2021 1:55 PM MRN: [id number] Date of Birth: Apr 04, 1929 Attending MD: Hildred Laser , MD CSN: [id number] Age: 87 Admit Type: Inpatient Procedure:                ERCP Indications:              Suspected ascending cholangitis, For therapy of                            ascending cholangitis Providers:                Hildred Laser, MD, Gwenlyn Fudge, RN, Kristine L.                            Risa Grill, Technician, Randa Spike, Merchant navy officer Referring MD:             Orson Eva, DO Medicines:                General Anesthesia Complications:            No immediate complications. Estimated Blood Loss:     Estimated blood loss: none. Procedure:                Pre-Anesthesia Assessment:                           - Prior to the procedure, a History and Physical                            was performed, and patient medications and                            allergies were reviewed. The patient's tolerance of                            previous anesthesia was also reviewed. The risks                            and benefits of the procedure and the sedation                            options and risks were discussed with the patient.                            All questions were answered, and informed consent                            was obtained. Prior Anticoagulants: The patient                            last took Xarelto (rivaroxaban) 2 days prior to the                            procedure. ASA Grade Assessment: IV - A patient  with severe systemic disease that is a constant                            threat to life. After reviewing the risks and                            benefits, the patient was deemed in satisfactory                            condition to undergo the procedure.                           After obtaining informed consent, the scope was                            passed under  direct vision. Throughout the                            procedure, the patient's blood pressure, pulse, and                            oxygen saturations were monitored continuously. The                            [employer] single use                            duodenoscope was introduced through the mouth, and                            used to inject contrast into and used to inject                            contrast into the bile duct. The ERCP was                            accomplished without difficulty. The patient                            tolerated the procedure well. Scope In: 2:47:26 PM Scope Out: 3:20:20 PM Total Procedure Duration: 0 hours 32 minutes 54 seconds  Findings:      A scout film of the abdomen was obtained. Surgical clips, consistent       with a previous cholecystectomy, were seen in the area of the right       upper quadrant of the abdomen. The esophagus was successfully intubated       under direct vision. The scope was advanced to a normal major papilla in       the descending duodenum without detailed examination of the pharynx,       larynx and associated structures, and upper GI tract. The upper GI tract       was grossly normal. A biliary sphincteroplasty had been performed. The       sphincteroplasty appeared open. A 5 mm biliary sphincterotomy was made  with a braided traction (standard) sphincterotome using ERBE       electrocautery. There was no post-sphincterotomy bleeding. To discover       objects, the biliary tree was swept with a 12 mm balloon and basket       starting at the bifurcation. Debris was swept from the duct. Bile duct       was also washed with normal saline to flush debris out. Impression:               - The prior biliary sphincteroplasty appeared open.                           - Markedly dilated CBD and CHD along with dilated                            intrahepatic biliary radicles.                            - Biliary sphincterotomy was extended by 5 mm.                           -Large amount of debris/sludge removed from bile                            duct with the help of Dormate basket and stone                            balloon extractor. Moderate Sedation:      Per Anesthesia Care Recommendation:           - Return patient to hospital ward for ongoing care.                           - Clear liquid diet today.                           - Continue present medications.                           - Avoid aspirin and anticoagulants for 3 days.                           - Urso 250 mg p.o. twice daily.                           - CBC, c-Met and serum amylase in a.m. Procedure Code(s):        --- Professional ---                           [id number], Endoscopic retrograde                            cholangiopancreatography (ERCP); with removal of                            calculi/debris from biliary/pancreatic duct(s)  43262, Endoscopic retrograde                            cholangiopancreatography (ERCP); with                            sphincterotomy/papillotomy Diagnosis Code(s):        --- Professional ---                           K83.09, Other cholangitis CPT copyright 2019 American Medical Association. All rights reserved. The codes documented in this report are preliminary and upon coder review may  be revised to meet current compliance requirements. Hildred Laser, MD Hildred Laser, MD 04/10/2021 3:47:05 PM This report has been signed electronically. Number of Addenda: 0

## 2021-04-10 NOTE — Progress Notes (Signed)
Subjective:  Patient complains of right upper quadrant and hypogastric pain.  She denies chest pain or shortness of breath. Patient's daughter [name] who is at bedside states that she has not been feeling well for 5 to 6 days.  Patient has been living with her daughter since she fell and broke her left femur in October 2022.  She ambulates few steps with walker.  She is requiring help though.  Current Medications:  Current Facility-Administered Medications:    [MAR Hold] acetaminophen (TYLENOL) tablet 650 mg, 650 mg, Oral, Q6H PRN **OR** [MAR Hold] acetaminophen (TYLENOL) suppository 650 mg, 650 mg, Rectal, Q6H PRN, Tat, David, MD   [MAR Hold] albuterol (VENTOLIN HFA) 108 (90 Base) MCG/ACT inhaler 2 puff, 2 puff, Inhalation, Q4H PRN, Tat, David, MD   Doug Sou Hold] Chlorhexidine Gluconate Cloth 2 % PADS 6 each, 6 each, Topical, Daily, Tat, David, MD, 6 each at 04/10/21 1134   dextrose 5 %-0.9 % sodium chloride infusion, , Intravenous, Continuous, Tat, David, MD, Stopped at 04/09/21 1257   glucagon (human recombinant) (GLUCAGEN) 1 MG injection, , , ,    [MAR Hold] HYDROmorphone (DILAUDID) injection 0.5 mg, 0.5 mg, Intravenous, Q3H PRN, Opyd, Ilene Qua, MD, 0.5 mg at 04/10/21 0847   [MAR Hold] insulin aspart (novoLOG) injection 0-9 Units, 0-9 Units, Subcutaneous, Q4H, Tat, Shanon Brow, MD, 2 Units at 04/10/21 0845   lactated ringers infusion, , Intravenous, Continuous, Battula, Rajamani C, MD, Last Rate: 50 mL/hr at 04/10/21 1257, New Bag at 04/10/21 1257   [MAR Hold] magnesium sulfate IVPB 2 g 50 mL, 2 g, Intravenous, Once, Battula, Rajamani C, MD, Last Rate: 50 mL/hr at 04/10/21 1256, 2 g at 04/10/21 1256   [MAR Hold] ondansetron (ZOFRAN) tablet 4 mg, 4 mg, Oral, Q6H PRN **OR** [MAR Hold] ondansetron (ZOFRAN) injection 4 mg, 4 mg, Intravenous, Q6H PRN, Tat, David, MD   Doug Sou Hold] piperacillin-tazobactam (ZOSYN) IVPB 3.375 g, 3.375 g, Intravenous, Q8H, Tat, David, MD, Last Rate: 12.5 mL/hr at 04/10/21 0640,  Infusion Verify at 04/10/21 0640   sodium chloride 0.9 % infusion, , , ,    Objective: Blood pressure 132/80, pulse 72, temperature 98.3 F (36.8 C), temperature source Oral, resp. rate 17, height '5\' 2"'  (1.575 m), weight 79.4 kg, SpO2 100 %. Patient is alert and in no acute distress. Conjunctiva is pink. Sclera is nonicteric Oropharyngeal mucosa is normal. No neck masses or thyromegaly noted. Cardiac exam with irregular rhythm normal S1 and S2. No murmur or gallop noted. Lungs are clear to auscultation. Abdomen is symmetrical.  On palpation is soft.  She has mild tenderness at right upper quadrant and hypogastric region. No LE edema or clubbing noted.  She has bilateral hallux valgus.  Labs/studies Results:   CBC Latest Ref Rng & Units 04/10/2021 04/09/2021 04/08/2021  WBC 4.0 - 10.5 K/uL 7.1 13.8(H) 14.5(H)  Hemoglobin 12.0 - 15.0 g/dL 10.7(L) 11.6(L) 13.1  Hematocrit 36.0 - 46.0 % 32.7(L) 34.9(L) 39.9  Platelets 150 - 400 K/uL 306 354 420(H)    CMP Latest Ref Rng & Units 04/10/2021 04/09/2021 04/08/2021  Glucose 70 - 99 mg/dL 140(H) 106(H) 60(L)  BUN 8 - 23 mg/dL 19 22 26(H)  Creatinine 0.44 - 1.00 mg/dL 1.50(H) 1.38(H) 1.52(H)  Sodium 135 - 145 mmol/L 136 134(L) 132(L)  Potassium 3.5 - 5.1 mmol/L 3.3(L) 4.0 4.1  Chloride 98 - 111 mmol/L 103 99 96(L)  CO2 22 - 32 mmol/L '26 25 25  ' Calcium 8.9 - 10.3 mg/dL 7.4(L) 7.6(L) 8.5(L)  Total Protein  6.5 - 8.1 g/dL 5.0(L) 5.9(L) 7.2  Total Bilirubin 0.3 - 1.2 mg/dL 2.3(H) 4.4(H) 3.8(H)  Alkaline Phos 38 - 126 U/L 201(H) 282(H) 349(H)  AST 15 - 41 U/L 41 62(H) 97(H)  ALT 0 - 44 U/L 34 46(H) 63(H)    Hepatic Function Latest Ref Rng & Units 04/10/2021 04/09/2021 04/08/2021  Total Protein 6.5 - 8.1 g/dL 5.0(L) 5.9(L) 7.2  Albumin 3.5 - 5.0 g/dL 2.4(L) 2.7(L) 3.5  AST 15 - 41 U/L 41 62(H) 97(H)  ALT 0 - 44 U/L 34 46(H) 63(H)  Alk Phosphatase 38 - 126 U/L 201(H) 282(H) 349(H)  Total Bilirubin 0.3 - 1.2 mg/dL 2.3(H) 4.4(H) 3.8(H)  Bilirubin,  Direct 0.00 - 0.40 mg/dL - - -    Blood cultures no growth  INR 1.4 this morning it was 2.6 on admission 2 days ago  Serum magnesium 1.3.  Assessment:  #1.  Acute cholangitis secondary to recurrent choledocholithiasis and remains to be seen if papillary stenosis has recurred.  She has markedly dilated biliary system and therefore at risk to develop choledocholithiasis due to stagnation.  She may benefit from long-term use of ursodeoxycholic acid. She seem to be responding to IV Zosyn.  Leukocytosis has resolved.  Bilirubin and transaminases are trending down. Patient would benefit from therapeutic ERCP procedure discussed in detail with her daughter Ms. Carmon Ginsberg who also has part of attorney.  She and patient are both agreeable.  #2.  Chronic atrial fibrillation.  Ventricular rate within normal limits.  Xarelto on hold.  Last dose was on Saturday.  #3.  Hypomagnesemia.  Dr.Battula has ordered IV magnesium.  #4.  Chronic kidney disease.  #5.  Diabetes mellitus.  Patient is on sliding scale coverage with NovoLog.  #6.  COPD/respiratory failure..  Chest film negative for pneumonia.  Patient presently on O2.  #7.  Anemia.  Anemia appears to be due to acute illness.  Hemoglobin admission was 13.1 g.  Hemoglobin somewhat dry on admission.  Plan:  Proceed with ERCP with stone extraction.  If papillary stenosis has recurred she may need balloon dilation or repeat sphincterotomy.

## 2021-04-10 NOTE — Transfer of Care (Signed)
Immediate Anesthesia Transfer of Care Note  Patient: [name]  Procedure(s) Performed: ENDOSCOPIC RETROGRADE CHOLANGIOPANCREATOGRAPHY (ERCP) SPHINCTEROTOMY Balloon dilation wire-guided  Patient Location: PACU  Anesthesia Type:General  Level of Consciousness: awake, alert , oriented and sedated  Airway & Oxygen Therapy: Patient Spontanous Breathing  Post-op Assessment: Report given to RN and Post -op Vital signs reviewed and stable  Post vital signs: stable  Last Vitals:  Vitals Value Taken Time  BP 154/76 04/10/21 1550  Temp 97.8   Pulse 86 04/10/21 1551  Resp 24 04/10/21 1551  SpO2 94 % 04/10/21 1551  Vitals shown include unvalidated device data.  Last Pain:  Vitals:   04/10/21 1239  TempSrc:   PainSc: 9       Patients Stated Pain Goal: 0 (14/43/60 1658)  Complications:ecchymosis below left lower eyelid from positioning

## 2021-04-10 NOTE — Anesthesia Postprocedure Evaluation (Signed)
Anesthesia Post Note  Patient: [name]  Procedure(s) Performed: ENDOSCOPIC RETROGRADE CHOLANGIOPANCREATOGRAPHY (ERCP) SPHINCTEROTOMY Balloon dilation wire-guided  Patient location during evaluation: PACU Anesthesia Type: General Level of consciousness: awake and alert, oriented and sedated Pain management: pain level controlled Vital Signs Assessment: post-procedure vital signs reviewed and stable Respiratory status: spontaneous breathing, respiratory function stable and patient connected to nasal cannula oxygen Cardiovascular status: blood pressure returned to baseline and stable Postop Assessment: no apparent nausea or vomiting Anesthetic complications: no   No notable events documented.   Last Vitals:  Vitals:   04/10/21 1233 04/10/21 1551  BP: 132/80 (!) 154/76  Pulse: 72 86  Resp: 17 (!) 24  Temp: 36.8 C 36.6 C  SpO2: 100% 94%    Last Pain:  Vitals:   04/10/21 1551  TempSrc:   PainSc: 0-No pain                 [name]

## 2021-04-10 NOTE — Progress Notes (Signed)
ERCP note.  Mucopurulent material noted to pass across sphincterotomy which was felt to be decent size. Cholangiogram revealed dilated biliary system with marked dilation of CBD and CHD containing filling defects felt to be due to large amount of debris. Sphincterotomy extended by 4 to 5 mm and large amount of debris removed using balloon and Dormate basket. Bile duct was also washed with normal saline to get rid of debris. Patient tolerated procedure well.

## 2021-04-10 NOTE — Discharge Summary (Addendum)
Physician Discharge Summary  BEAUTIFUL PENSYL UTM:[id number] DOB: 08-01-1929 DOA: 04/08/2021  PCP: Celene Squibb, MD  Admit date: 04/08/2021 Discharge date: 04/11/2021  Admitted From: Home Disposition:  Home   Recommendations for Outpatient Follow-up:  Follow up with PCP in 1-2 weeks Please obtain BMP/CBC in one week  Home Health:[facility], [facility]  Discharge Condition: Stable CODE STATUS: DNR Diet recommendation:Regular   Brief/Interim Summary: 87 year old female with a history of COPD, CKD stage IIIb, atrial fibrillation, diabetes mellitus type 2, hypertension, hyperlipidemia, anxiety presenting with 3 to 4-day history of upper abdominal pain, chills and rigors, and nausea.  The patient is a difficult historian.  History is supplemented by the patient's daughter at the bedside.  Apparently, the patient had a fever up to 104.0 F on 04/05/2021.  She was taken to her PCP.  The patient was told to take MiraLAX daily.  She did not have any bowel movements.  She continued to have abdominal pain that gradually worsened.  She continues to have rigors.  She had a poor appetite and nausea.  She has not had any headache, neck pain, chest pain, diarrhea, dysuria, hematuria, hematochezia, melena. Notably, the patient has had some intermittent coughing and shortness of breath, primarily with exertion.  Daughter states that the patient had COVID-19 on March 09, 2021.  Apparently, she was treated with some type of "blue pill" by her primary care provider.  She did not require hospitalization.  Since her COVID diagnosis in July, the patient has had some intermittent shortness of breath and coughing.  She states this has not significantly worsened. In the emergency department, the patient was febrile up to 101.8 F.  She had atrial fibrillation with RVR with heart rate 108.  She was hemodynamically stable.  Initially oxygen saturation was in the mid 80s.  The patient was placed on 4 L with saturation up to 98%.  She was  started IV fluids and IV Zosyn.  Discharge Diagnoses:  Sepsis -Present on admission -Patient presented with fever, tachycardia, and leukocytosis -Secondary to biliary/intra-abdominal source -Lactic acid 1.0 -UA neg of pyuria -follow blood culture--neg to date -Continue Zosyn during hospitalization -Continued IV fluids -sepsis physiology resolved -d/c home with amox/clavx 5 days   Transaminasemia/Acute Cholangitis -04/08/2021 CT abd/pelvis--intrahepatic and extrahepatic ductal dilatation the degree of which was similar to 2018.  Focal density at the distal CBD suspicious for possible choledocholithiasis.  No hydronephrosis  -GI consult appreciated -Continue Zosyn during hospitalization -07/16/2017 ERCP--biliary papillary stenosis; biliary defect consistent with stone & slugde; choledocholithiasis noted on cholangiogram; sphincterotomy and balloon extraction performed -04/10/21--ERCP--Large amount of debris/sludge removed from bile duct; prior sphincterotomy extended 5 mm -start urso 300 mg bid>>d/c home with this -8/15--discussed with Dr. Laural Golden -diet advanced after ERCP which patient tolerated   Atrial fibrillation with RVR, type unspecified -Holding rivaroxaban in anticipation for GI procedure -Initially presented with heart rate 100-110 -Currently rate controlled -restart Xarelto on 04/13/21   CKD stage IIIb -Baseline creatinine 1.4-1.6 -stable -serum creatinine 1.43 on day of d/c   Acute respiratory failure with hypoxia -Etiology unclear suspect hypoventilation -Personally reviewed chest x-ray--no edema or consolidation -Initially placed on 4 L -Currently stable on 2 L nasal cannula--saturation 100% -weaned to RA at time of d/c   Diabetes mellitus type 2, uncontrolled with hyperglycemia -NovoLog sliding scale -Holding Tresiba temporarily>>restart after d/c -Holding glipizide>>restart after dc -8/14 A1c--8.2   Essential hypertension -Holding amlodipine>>restart    Hyperlipidemia -Holding statin temporarily -restart statin after d/c   Discharge Instructions  Allergies as of 04/11/2021       Reactions   Hydromorphone Hcl Other (See Comments)   Patient goes out of right state of mind.         Medication List     STOP taking these medications    HYDROcodone-acetaminophen 5-325 MG tablet Commonly known as: Norco       TAKE these medications    albuterol 108 (90 Base) MCG/ACT inhaler Commonly known as: VENTOLIN HFA Inhale 2 puffs into the lungs every 4 (four) hours as needed for wheezing or shortness of breath.   amLODipine 5 MG tablet Commonly known as: NORVASC Take 5 mg by mouth daily.   amoxicillin-clavulanate 875-125 MG tablet Commonly known as: AUGMENTIN Take 1 tablet by mouth 2 (two) times daily.   Durezol 0.05 % Emul Generic drug: Difluprednate Apply 1 drop to eye daily.   furosemide 20 MG tablet Commonly known as: LASIX Take 20 mg daily as needed by mouth for fluid.   gabapentin 100 MG capsule Commonly known as: NEURONTIN TAKE 1 CAPSULE BY MOUTH THREE TIMES A DAY What changed: See the new instructions.   glipiZIDE 5 MG tablet Commonly known as: GLUCOTROL Take 5 mg by mouth 2 (two) times daily.   Lokelma 10 g Pack packet Generic drug: sodium zirconium cyclosilicate Take 1 packet by mouth daily.   omeprazole 20 MG capsule Commonly known as: PRILOSEC Take 20 mg by mouth daily.   ondansetron 4 MG tablet Commonly known as: ZOFRAN Take 4 mg by mouth every 8 (eight) hours as needed for nausea/vomiting.   pravastatin 40 MG tablet Commonly known as: PRAVACHOL Take 40 mg by mouth daily.   sertraline 25 MG tablet Commonly known as: ZOLOFT Take 25 mg by mouth daily.   traMADol 50 MG tablet Commonly known as: ULTRAM Take 1 tablet (50 mg total) by mouth every 12 (twelve) hours as needed for severe pain.   Tyler Aas FlexTouch 100 UNIT/ML FlexTouch Pen Generic drug: insulin degludec Inject 10 Units into  the skin daily.   ursodiol 300 MG capsule Commonly known as: ACTIGALL Take 1 capsule (300 mg total) by mouth 2 (two) times daily.   Xarelto 15 MG Tabs tablet Generic drug: Rivaroxaban Take 1 tablet (15 mg total) by mouth daily. Restart on 04/13/2021 Start taking on: April 13, 2021 What changed:  additional instructions These instructions start on April 13, 2021. If you are unsure what to do until then, ask your doctor or other care provider.        Allergies  Allergen Reactions   Hydromorphone Hcl Other (See Comments)    Patient goes out of right state of mind.     Consultations: GI   Procedures/Studies: CT ABDOMEN PELVIS W CONTRAST  Result Date: 04/08/2021 CLINICAL DATA:  Acute abdominal pain for several days EXAM: CT ABDOMEN AND PELVIS WITH CONTRAST TECHNIQUE: Multidetector CT imaging of the abdomen and pelvis was performed using the standard protocol following bolus administration of intravenous contrast. CONTRAST:  86mL OMNIPAQUE IOHEXOL 300 MG/ML  SOLN COMPARISON:  07/14/2017 FINDINGS: Lower chest: No acute abnormality. Hepatobiliary: Gallbladder has been surgically removed. The liver is within normal limits with the exception of significant biliary ductal dilatation. This continues to the level of the ampulla. Focal density is noted in the distal common bile duct suspicious for choledocholithiasis. Pancreas: Pancreas demonstrates diffuse fatty infiltration. Spleen: Normal in size without focal abnormality. Adrenals/Urinary Tract: Adrenal glands are unremarkable. Kidneys demonstrate no renal calculi. No obstructive changes are seen. Parapelvic cysts  are noted bilaterally. The ureters are within normal limits bilaterally. The bladder is decompressed. Stomach/Bowel: Scattered diverticular change of the colon is noted without evidence of definitive diverticulitis. No obstructive or inflammatory changes of the colon are seen. The appendix has been surgically removed. Small bowel and  stomach appear within normal limits. Vascular/Lymphatic: Aortic atherosclerosis. No enlarged abdominal or pelvic lymph nodes. Reproductive: Status post hysterectomy. No adnexal masses. Other: Small fat containing umbilical hernia is noted. No free fluid is noted. Musculoskeletal: Postsurgical changes are noted in the proximal femurs. Degenerative change of the lumbar spine is noted. Mild anterolisthesis of L4 on L5 is seen. IMPRESSION: Significant intrahepatic and extrahepatic biliary ductal dilatation. On the coronal reconstructions there are findings suspicious for distal choledocholithiasis. The degree of dilatation is similar to that seen in 2018. Need for further workup can be determined on a clinical basis. Diverticulosis without diverticulitis. No other acute abnormality is noted. Electronically Signed   By: Inez Catalina M.D.   On: 04/08/2021 22:48   DG CHEST PORT 1 VIEW  Result Date: 04/09/2021 CLINICAL DATA:  Respiratory failure and hypoxia EXAM: PORTABLE CHEST 1 VIEW COMPARISON:  06/12/2020 FINDINGS: Cardiac shadow is enlarged. Aortic calcifications are noted as well as tortuosity stable from the prior exam. No focal infiltrate or sizable effusion is seen. No acute bony abnormality is noted. IMPRESSION: No acute abnormality noted. Electronically Signed   By: Inez Catalina M.D.   On: 04/09/2021 00:29   DG ERCP  Result Date: 04/10/2021 CLINICAL DATA:  87 year old female with choledocholithiasis. EXAM: ERCP TECHNIQUE: Multiple spot images obtained with the fluoroscopic device and submitted for interpretation post-procedure. FLUOROSCOPY TIME:  Fluoroscopy Time:  3 minutes 21 seconds Number of Acquired Spot Images: 0 COMPARISON:  CT abdomen/pelvis 04/08/2021 FINDINGS: Numerous intraoperative saved images and cine clips are submitted for review. The images demonstrate a flexible duodenal scope in the descending duodenum. Subsequent images demonstrate cannulation of the common bile duct, sphincterotomy and  cholangiogram. The common hepatic and common bile duct are markedly dilated. Multiple filling defects are present in the common bile duct consistent with choledocholithiasis. Subsequent images demonstrate both basket sweeping and balloon sweeping of the common duct. IMPRESSION: 1. Choledocholithiasis with intra and extrahepatic biliary ductal dilatation. 2. ERCP with sphincterotomy and combined basket and balloon sweeping of the common bile duct. These images were submitted for radiologic interpretation only. Please see the procedural report for the amount of contrast and the fluoroscopy time utilized. Electronically Signed   By: Jacqulynn Cadet M.D.   On: 04/10/2021 15:54        Discharge Exam: Vitals:   04/11/21 0700 04/11/21 0800  BP:  107/71  Pulse: (!) 58 62  Resp: 14 16  Temp:    SpO2: 95% 97%   Vitals:   04/11/21 0418 04/11/21 0600 04/11/21 0700 04/11/21 0800  BP:  (!) 105/57  107/71  Pulse:  63 (!) 58 62  Resp:  13 14 16   Temp: (!) 97.5 F (36.4 C)     TempSrc: Oral     SpO2:  97% 95% 97%  Weight: 85.4 kg     Height:        General: Pt is alert, awake, not in acute distress Cardiovascular: RRR, S1/S2 +, no rubs, no gallops Respiratory: CTA bilaterally, no wheezing, no rhonchi Abdominal: Soft, mild epigastric pain, ND, bowel sounds + Extremities: no edema, no cyanosis   The results of significant diagnostics from this hospitalization (including imaging, microbiology, ancillary and laboratory) are listed below  for reference.    Significant Diagnostic Studies: CT ABDOMEN PELVIS W CONTRAST  Result Date: 04/08/2021 CLINICAL DATA:  Acute abdominal pain for several days EXAM: CT ABDOMEN AND PELVIS WITH CONTRAST TECHNIQUE: Multidetector CT imaging of the abdomen and pelvis was performed using the standard protocol following bolus administration of intravenous contrast. CONTRAST:  73mL OMNIPAQUE IOHEXOL 300 MG/ML  SOLN COMPARISON:  07/14/2017 FINDINGS: Lower chest: No acute  abnormality. Hepatobiliary: Gallbladder has been surgically removed. The liver is within normal limits with the exception of significant biliary ductal dilatation. This continues to the level of the ampulla. Focal density is noted in the distal common bile duct suspicious for choledocholithiasis. Pancreas: Pancreas demonstrates diffuse fatty infiltration. Spleen: Normal in size without focal abnormality. Adrenals/Urinary Tract: Adrenal glands are unremarkable. Kidneys demonstrate no renal calculi. No obstructive changes are seen. Parapelvic cysts are noted bilaterally. The ureters are within normal limits bilaterally. The bladder is decompressed. Stomach/Bowel: Scattered diverticular change of the colon is noted without evidence of definitive diverticulitis. No obstructive or inflammatory changes of the colon are seen. The appendix has been surgically removed. Small bowel and stomach appear within normal limits. Vascular/Lymphatic: Aortic atherosclerosis. No enlarged abdominal or pelvic lymph nodes. Reproductive: Status post hysterectomy. No adnexal masses. Other: Small fat containing umbilical hernia is noted. No free fluid is noted. Musculoskeletal: Postsurgical changes are noted in the proximal femurs. Degenerative change of the lumbar spine is noted. Mild anterolisthesis of L4 on L5 is seen. IMPRESSION: Significant intrahepatic and extrahepatic biliary ductal dilatation. On the coronal reconstructions there are findings suspicious for distal choledocholithiasis. The degree of dilatation is similar to that seen in 2018. Need for further workup can be determined on a clinical basis. Diverticulosis without diverticulitis. No other acute abnormality is noted. Electronically Signed   By: Inez Catalina M.D.   On: 04/08/2021 22:48   DG CHEST PORT 1 VIEW  Result Date: 04/09/2021 CLINICAL DATA:  Respiratory failure and hypoxia EXAM: PORTABLE CHEST 1 VIEW COMPARISON:  06/12/2020 FINDINGS: Cardiac shadow is enlarged.  Aortic calcifications are noted as well as tortuosity stable from the prior exam. No focal infiltrate or sizable effusion is seen. No acute bony abnormality is noted. IMPRESSION: No acute abnormality noted. Electronically Signed   By: Inez Catalina M.D.   On: 04/09/2021 00:29   DG ERCP  Result Date: 04/10/2021 CLINICAL DATA:  87 year old female with choledocholithiasis. EXAM: ERCP TECHNIQUE: Multiple spot images obtained with the fluoroscopic device and submitted for interpretation post-procedure. FLUOROSCOPY TIME:  Fluoroscopy Time:  3 minutes 21 seconds Number of Acquired Spot Images: 0 COMPARISON:  CT abdomen/pelvis 04/08/2021 FINDINGS: Numerous intraoperative saved images and cine clips are submitted for review. The images demonstrate a flexible duodenal scope in the descending duodenum. Subsequent images demonstrate cannulation of the common bile duct, sphincterotomy and cholangiogram. The common hepatic and common bile duct are markedly dilated. Multiple filling defects are present in the common bile duct consistent with choledocholithiasis. Subsequent images demonstrate both basket sweeping and balloon sweeping of the common duct. IMPRESSION: 1. Choledocholithiasis with intra and extrahepatic biliary ductal dilatation. 2. ERCP with sphincterotomy and combined basket and balloon sweeping of the common bile duct. These images were submitted for radiologic interpretation only. Please see the procedural report for the amount of contrast and the fluoroscopy time utilized. Electronically Signed   By: Jacqulynn Cadet M.D.   On: 04/10/2021 15:54    Microbiology: Recent Results (from the past 240 hour(s))  Urine Culture     Status: Abnormal  Collection Time: 04/08/21 10:00 PM   Specimen: Urine, Catheterized  Result Value Ref Range Status   Specimen Description   Final    URINE, CATHETERIZED Performed at [facility], [address], [place], [place] [address]    Special Requests   Final     NONE Performed at [facility], [address], [place], [place] [address]    Culture (A)  Final    <10,000 COLONIES/mL INSIGNIFICANT GROWTH Performed at  [address], [place], [place] [address]    Report Status 04/10/2021 FINAL  Final  Culture, blood (x 2)     Status: None (Preliminary result)   Collection Time: 04/08/21 10:43 PM   Specimen: Left Antecubital; Blood  Result Value Ref Range Status   Specimen Description LEFT ANTECUBITAL  Final   Special Requests   Final    BOTTLES DRAWN AEROBIC AND ANAEROBIC Blood Culture adequate volume   Culture   Final    NO GROWTH 3 DAYS Performed at [facility], [address], [place], [place] [address]    Report Status PENDING  Incomplete  Culture, blood (x 2)     Status: None (Preliminary result)   Collection Time: 04/09/21 12:41 AM   Specimen: BLOOD RIGHT HAND  Result Value Ref Range Status   Specimen Description BLOOD RIGHT HAND  Final   Special Requests   Final    BOTTLES DRAWN AEROBIC AND ANAEROBIC Blood Culture adequate volume   Culture   Final    NO GROWTH 2 DAYS Performed at [facility], [address] [place], [place] [address]    Report Status PENDING  Incomplete  Resp Panel by RT-PCR (Flu A&B, Covid) Nasopharyngeal Swab     Status: None   Collection Time: 04/09/21  2:35 AM   Specimen: Nasopharyngeal Swab; Nasopharyngeal(NP) swabs in vial transport medium  Result Value Ref Range Status   SARS Coronavirus 2 by RT PCR NEGATIVE NEGATIVE Final    Comment: (NOTE) SARS-CoV-2 target nucleic acids are NOT DETECTED.  The SARS-CoV-2 RNA is generally detectable in upper respiratory specimens during the acute phase of infection. The lowest concentration of SARS-CoV-2 viral copies this assay can detect is 138 copies/mL. A negative result does not preclude SARS-Cov-2 infection and should not be used as the sole basis for treatment or other patient management decisions. A negative result may occur with  improper  specimen collection/handling, submission of specimen other than nasopharyngeal swab, presence of viral mutation(s) within the areas targeted by this assay, and inadequate number of viral copies(<138 copies/mL). A negative result must be combined with clinical observations, patient history, and epidemiological information. The expected result is Negative.  Fact Sheet for Patients:  EntrepreneurPulse.com.au  Fact Sheet for Healthcare Providers:  IncredibleEmployment.be  This test is no t yet approved or cleared by the [place] FDA and  has been authorized for detection and/or diagnosis of SARS-CoV-2 by FDA under an Emergency Use Authorization (EUA). This EUA will remain  in effect (meaning this test can be used) for the duration of the COVID-19 declaration under Section 564(b)(1) of the Act, 21 U.S.C.section 360bbb-3(b)(1), unless the authorization is terminated  or revoked sooner.       Influenza A by PCR NEGATIVE NEGATIVE Final   Influenza B by PCR NEGATIVE NEGATIVE Final    Comment: (NOTE) The Xpert Xpress SARS-CoV-2/FLU/RSV plus assay is intended as an aid in the diagnosis of influenza from Nasopharyngeal swab specimens and should not be used as a sole basis for  treatment. Nasal washings and aspirates are unacceptable for Xpert Xpress SARS-CoV-2/FLU/RSV testing.  Fact Sheet for Patients: EntrepreneurPulse.com.au  Fact Sheet for Healthcare Providers: IncredibleEmployment.be  This test is not yet approved or cleared by the [place] FDA and has been authorized for detection and/or diagnosis of SARS-CoV-2 by FDA under an Emergency Use Authorization (EUA). This EUA will remain in effect (meaning this test can be used) for the duration of the COVID-19 declaration under Section 564(b)(1) of the Act, 21 U.S.C. section 360bbb-3(b)(1), unless the authorization is terminated or revoked.  Performed at  [facility], [address] [place], [place] [address]   MRSA Next Gen by PCR, Nasal     Status: None   Collection Time: 04/09/21  1:00 PM   Specimen: Nasal Mucosa; Nasal Swab  Result Value Ref Range Status   MRSA by PCR Next Gen NOT DETECTED NOT DETECTED Final    Comment: (NOTE) The GeneXpert MRSA Assay (FDA approved for NASAL specimens only), is one component of a comprehensive MRSA colonization surveillance program. It is not intended to diagnose MRSA infection nor to guide or monitor treatment for MRSA infections. Test performance is not FDA approved in patients less than 64 years old. Performed at [facility], [address], [place], [place] [address]      Labs: Basic Metabolic Panel: Recent Labs  Lab 04/08/21 2041 04/09/21 0752 04/10/21 0430 04/10/21 1639 04/11/21 0528  NA 132* 134* 136 139 142  K 4.1 4.0 3.3* 3.8 4.5  CL 96* 99 103 107 107  CO2 25 25 26 26 26   GLUCOSE 60* 106* 140* 137* 212*  BUN 26* 22 19 16 16   CREATININE 1.52* 1.38* 1.50* 1.35* 1.43*  CALCIUM 8.5* 7.6* 7.4* 7.9* 8.3*  MG  --  1.3*  --   --   --    Liver Function Tests: Recent Labs  Lab 04/08/21 2041 04/09/21 0752 04/10/21 0430 04/10/21 1639 04/11/21 0528  AST 97* 62* 41 37 25  ALT 63* 46* 34 34 28  ALKPHOS 349* 282* 201* 228* 203*  BILITOT 3.8* 4.4* 2.3* 2.2* 1.3*  PROT 7.2 5.9* 5.0* 5.8* 5.2*  ALBUMIN 3.5 2.7* 2.4* 2.6* 2.4*   Recent Labs  Lab 04/08/21 2243 04/11/21 0528  LIPASE 18  --   AMYLASE  --  35   No results for input(s): AMMONIA in the last 168 hours. CBC: Recent Labs  Lab 04/08/21 2041 04/09/21 0752 04/10/21 0430 04/11/21 0528  WBC 14.5* 13.8* 7.1 6.9  NEUTROABS 13.7*  --   --   --   HGB 13.1 11.6* 10.7* 10.8*  HCT 39.9 34.9* 32.7* 34.2*  MCV 90.9 89.0 91.6 91.9  PLT 420* 354 306 325   Cardiac Enzymes: No results for input(s): CKTOTAL, CKMB, CKMBINDEX, TROPONINI in the last 168 hours. BNP: Invalid input(s): POCBNP CBG: Recent Labs  Lab  04/10/21 1928 04/10/21 2243 04/11/21 0408 04/11/21 0805 04/11/21 1135  GLUCAP 157* 219* 194* 141* 95    Time coordinating discharge:  36 minutes  Signed:  Orson Eva, DO [facility] Pager: [phone] 04/11/2021, 12:50 PM

## 2021-04-10 NOTE — Anesthesia Procedure Notes (Addendum)
Procedure Name: Intubation Date/Time: 04/10/2021 2:34 PM Performed by: Ollen Bowl, CRNA Pre-anesthesia Checklist: Patient identified, Patient being monitored, Timeout performed, Emergency Drugs available and Suction available Patient Re-evaluated:Patient Re-evaluated prior to induction Oxygen Delivery Method: Circle system utilized Preoxygenation: Pre-oxygenation with 100% oxygen Induction Type: IV induction Ventilation: Mask ventilation without difficulty Laryngoscope Size: Mac and 3 Grade View: Grade I Tube type: Oral Tube size: 7.0 mm Number of attempts: 1 Airway Equipment and Method: Stylet Placement Confirmation: ETT inserted through vocal cords under direct vision, positive ETCO2 and breath sounds checked- equal and bilateral Secured at: 21 cm Tube secured with: Tape Dental Injury: Teeth and Oropharynx as per pre-operative assessment

## 2021-04-10 NOTE — Progress Notes (Signed)
PROGRESS NOTE  [name] PJA:[id number] DOB: Dec 20, 1928 DOA: 04/08/2021 PCP: Celene Squibb, MD    Brief History:  87 year old female with a history of COPD, CKD stage IIIb, atrial fibrillation, diabetes mellitus type 2, hypertension, hyperlipidemia, anxiety presenting with 3 to 4-day history of upper abdominal pain, chills and rigors, and nausea.  The patient is a difficult historian.  History is supplemented by the patient's daughter at the bedside.  Apparently, the patient had a fever up to 104.0 F on 04/05/2021.  She was taken to her PCP.  The patient was told to take MiraLAX daily.  She did not have any bowel movements.  She continued to have abdominal pain that gradually worsened.  She continues to have rigors.  She had a poor appetite and nausea.  She has not had any headache, neck pain, chest pain, diarrhea, dysuria, hematuria, hematochezia, melena. Notably, the patient has had some intermittent coughing and shortness of breath, primarily with exertion.  Daughter states that the patient had COVID-19 on March 09, 2021.  Apparently, she was treated with some type of "blue pill" by her primary care provider.  She did not require hospitalization.  Since her COVID diagnosis in July, the patient has had some intermittent shortness of breath and coughing.  She states this has not significantly worsened. In the emergency department, the patient was febrile up to 101.8 F.  She had atrial fibrillation with RVR with heart rate 108.  She was hemodynamically stable.  Initially oxygen saturation was in the mid 80s.  The patient was placed on 4 L with saturation up to 98%.  She was started IV fluids and IV Zosyn.   Assessment/Plan: Sepsis -Present on admission -Patient presented with fever, tachycardia, and leukocytosis -Secondary to biliary/intra-abdominal source -Lactic acid 1.0 -UA neg of pyuria -follow blood culture--neg to date -Continue Zosyn -Continue IV fluids -sepsis physiology  resolved   Transaminasemia/Acute Cholangitis -04/08/2021 CT abd/pelvis--intrahepatic and extrahepatic ductal dilatation the degree of which was similar to 2018.  Focal density at the distal CBD suspicious for possible choledocholithiasis.  No hydronephrosis  -GI consult appreciated -Continue Zosyn -07/16/2017 ERCP--biliary papillary stenosis; biliary defect consistent with stone & slugde; choledocholithiasis noted on cholangiogram; sphincterotomy and balloon extraction performed -04/10/21--ERCP--Large amount of debris/sludge removed from bile duct; prior sphincterotomy extended 5 mm -start urso 300 mg bid -8/15--discussed with Dr. Laural Golden   Atrial fibrillation with RVR, type unspecified -Holding rivaroxaban in anticipation for GI procedure -Initially presented with heart rate 100-110 -Currently rate controlled   CKD stage IIIb -Baseline creatinine 1.4-1.6   Acute respiratory failure with hypoxia -Etiology unclear -Personally reviewed chest x-ray--no edema or consolidation -Initially placed on 4 L -Currently stable on 2 L nasal cannula--saturation 100%   Diabetes mellitus type 2, uncontrolled with hyperglycemia -NovoLog sliding scale -Holding Tresiba temporarily -Holding glipizide -8/14 A1c--8.2   Essential hypertension -Holding amlodipine   Hyperlipidemia -Holding statin temporarily         Status is: Inpatient   Remains inpatient appropriate because:Inpatient level of care appropriate due to severity of illness   Dispo: The patient is from: Home              Anticipated d/c is to: Home              Patient currently is not medically stable to d/c.              Difficult to place patient No  Family Communication:   Daughter updated at bedside 8/15   Consultants:  GI   Code Status:  DNR   DVT Prophylaxis:  Xarelto on hold     Procedures: As Listed in Progress Note Above   Antibiotics: Zosyn 8/13>>       Subjective: Patient denies  fevers, chills, headache, chest pain, dyspnea, nausea, vomiting, diarrhea, abdominal pain, dysuria, hematuria, hematochezia, and melena.   Objective: Vitals:   04/10/21 1100 04/10/21 1233 04/10/21 1551 04/10/21 1600  BP: 101/64 132/80 (!) 154/76 (!) 147/75  Pulse: 72 72 86 69  Resp: 13 17 (!) 24 15  Temp: 98.3 F (36.8 C) 98.3 F (36.8 C) 97.8 F (36.6 C)   TempSrc: Oral Oral    SpO2: 100% 100% 94% 96%  Weight:      Height:        Intake/Output Summary (Last 24 hours) at 04/10/2021 1647 Last data filed at 04/10/2021 1634 Gross per 24 hour  Intake 1104.24 ml  Output 775 ml  Net 329.24 ml   Weight change: 8.185 kg Exam:  General:  Pt is alert, follows commands appropriately, not in acute distress HEENT: No icterus, No thrush, No neck mass, [place]/AT Cardiovascular: RRR, S1/S2, no rubs, no gallops Respiratory: fine left basilar crackles. No wheeze Abdomen: Soft/+BS, mild epigastric tender, non distended, no guarding Extremities: No edema, No lymphangitis, No petechiae, No rashes, no synovitis   Data Reviewed: I have personally reviewed following labs and imaging studies Basic Metabolic Panel: Recent Labs  Lab 04/08/21 2041 04/09/21 0752 04/10/21 0430  NA 132* 134* 136  K 4.1 4.0 3.3*  CL 96* 99 103  CO2 25 25 26   GLUCOSE 60* 106* 140*  BUN 26* 22 19  CREATININE 1.52* 1.38* 1.50*  CALCIUM 8.5* 7.6* 7.4*  MG  --  1.3*  --    Liver Function Tests: Recent Labs  Lab 04/08/21 2041 04/09/21 0752 04/10/21 0430  AST 97* 62* 41  ALT 63* 46* 34  ALKPHOS 349* 282* 201*  BILITOT 3.8* 4.4* 2.3*  PROT 7.2 5.9* 5.0*  ALBUMIN 3.5 2.7* 2.4*   Recent Labs  Lab 04/08/21 2243  LIPASE 18   No results for input(s): AMMONIA in the last 168 hours. Coagulation Profile: Recent Labs  Lab 04/08/21 2243 04/09/21 0524 04/10/21 0430  INR 2.6* 2.3* 1.4*   CBC: Recent Labs  Lab 04/08/21 2041 04/09/21 0752 04/10/21 0430  WBC 14.5* 13.8* 7.1  NEUTROABS 13.7*  --   --    HGB 13.1 11.6* 10.7*  HCT 39.9 34.9* 32.7*  MCV 90.9 89.0 91.6  PLT 420* 354 306   Cardiac Enzymes: No results for input(s): CKTOTAL, CKMB, CKMBINDEX, TROPONINI in the last 168 hours. BNP: Invalid input(s): POCBNP CBG: Recent Labs  Lab 04/10/21 0754 04/10/21 1143 04/10/21 1249 04/10/21 1555 04/10/21 1633  GLUCAP 165* 114* 116* 137* 135*   HbA1C: Recent Labs    04/09/21 0524  HGBA1C 8.2*   Urine analysis:    Component Value Date/Time   COLORURINE YELLOW 04/08/2021 2046   APPEARANCEUR CLEAR 04/08/2021 2046   LABSPEC 1.006 04/08/2021 2046   PHURINE 5.0 04/08/2021 2046   GLUCOSEU NEGATIVE 04/08/2021 2046   HGBUR SMALL (A) 04/08/2021 2046   BILIRUBINUR NEGATIVE 04/08/2021 2046   KETONESUR NEGATIVE 04/08/2021 2046   PROTEINUR NEGATIVE 04/08/2021 2046   UROBILINOGEN 0.2 04/05/2015 2245   NITRITE NEGATIVE 04/08/2021 2046   LEUKOCYTESUR NEGATIVE 04/08/2021 2046   Sepsis Labs: @LABRCNTIP (procalcitonin:4,lacticidven:4) ) Recent Results (from the past 240 hour(s))  Urine Culture     Status: Abnormal   Collection Time: 04/08/21 10:00 PM   Specimen: Urine, Catheterized  Result Value Ref Range Status   Specimen Description   Final    URINE, CATHETERIZED Performed at [facility], [address] [place], [place] [address]    Special Requests   Final    NONE Performed at [facility], [address], [place], [place] [address]    Culture (A)  Final    <10,000 COLONIES/mL INSIGNIFICANT GROWTH Performed at [facility] [address], [place], [place] [address]    Report Status 04/10/2021 FINAL  Final  Culture, blood (x 2)     Status: None (Preliminary result)   Collection Time: 04/08/21 10:43 PM   Specimen: Left Antecubital; Blood  Result Value Ref Range Status   Specimen Description LEFT ANTECUBITAL  Final   Special Requests   Final    BOTTLES DRAWN AEROBIC AND ANAEROBIC Blood Culture adequate volume   Culture   Final    NO GROWTH 2 DAYS Performed at  [facility], [address] [place], [place] [address]    Report Status PENDING  Incomplete  Culture, blood (x 2)     Status: None (Preliminary result)   Collection Time: 04/09/21 12:41 AM   Specimen: BLOOD RIGHT HAND  Result Value Ref Range Status   Specimen Description BLOOD RIGHT HAND  Final   Special Requests   Final    BOTTLES DRAWN AEROBIC AND ANAEROBIC Blood Culture adequate volume   Culture   Final    NO GROWTH 1 DAY Performed at [facility], [address], [place], [place] [address]    Report Status PENDING  Incomplete  Resp Panel by RT-PCR (Flu A&B, Covid) Nasopharyngeal Swab     Status: None   Collection Time: 04/09/21  2:35 AM   Specimen: Nasopharyngeal Swab; Nasopharyngeal(NP) swabs in vial transport medium  Result Value Ref Range Status   SARS Coronavirus 2 by RT PCR NEGATIVE NEGATIVE Final    Comment: (NOTE) SARS-CoV-2 target nucleic acids are NOT DETECTED.  The SARS-CoV-2 RNA is generally detectable in upper respiratory specimens during the acute phase of infection. The lowest concentration of SARS-CoV-2 viral copies this assay can detect is 138 copies/mL. A negative result does not preclude SARS-Cov-2 infection and should not be used as the sole basis for treatment or other patient management decisions. A negative result may occur with  improper specimen collection/handling, submission of specimen other than nasopharyngeal swab, presence of viral mutation(s) within the areas targeted by this assay, and inadequate number of viral copies(<138 copies/mL). A negative result must be combined with clinical observations, patient history, and epidemiological information. The expected result is Negative.  Fact Sheet for Patients:  EntrepreneurPulse.com.au  Fact Sheet for Healthcare Providers:  IncredibleEmployment.be  This test is no t yet approved or cleared by the [place] FDA and  has been authorized for detection and/or  diagnosis of SARS-CoV-2 by FDA under an Emergency Use Authorization (EUA). This EUA will remain  in effect (meaning this test can be used) for the duration of the COVID-19 declaration under Section 564(b)(1) of the Act, 21 U.S.C.section 360bbb-3(b)(1), unless the authorization is terminated  or revoked sooner.       Influenza A by PCR NEGATIVE NEGATIVE Final   Influenza B by PCR NEGATIVE NEGATIVE Final    Comment: (NOTE) The Xpert Xpress SARS-CoV-2/FLU/RSV plus assay is intended as an aid in the diagnosis of influenza from Nasopharyngeal swab specimens  and should not be used as a sole basis for treatment. Nasal washings and aspirates are unacceptable for Xpert Xpress SARS-CoV-2/FLU/RSV testing.  Fact Sheet for Patients: EntrepreneurPulse.com.au  Fact Sheet for Healthcare Providers: IncredibleEmployment.be  This test is not yet approved or cleared by the [place] FDA and has been authorized for detection and/or diagnosis of SARS-CoV-2 by FDA under an Emergency Use Authorization (EUA). This EUA will remain in effect (meaning this test can be used) for the duration of the COVID-19 declaration under Section 564(b)(1) of the Act, 21 U.S.C. section 360bbb-3(b)(1), unless the authorization is terminated or revoked.  Performed at [facility], [address] [place], [place] [address]   MRSA Next Gen by PCR, Nasal     Status: None   Collection Time: 04/09/21  1:00 PM   Specimen: Nasal Mucosa; Nasal Swab  Result Value Ref Range Status   MRSA by PCR Next Gen NOT DETECTED NOT DETECTED Final    Comment: (NOTE) The GeneXpert MRSA Assay (FDA approved for NASAL specimens only), is one component of a comprehensive MRSA colonization surveillance program. It is not intended to diagnose MRSA infection nor to guide or monitor treatment for MRSA infections. Test performance is not FDA approved in patients less than 64 years old. Performed at [facility], [address], [place], [place] [address]      Scheduled Meds:  Chlorhexidine Gluconate Cloth  6 each Topical Daily   insulin aspart  0-9 Units Subcutaneous Q4H   ursodiol  300 mg Oral BID   Continuous Infusions:  dextrose 5 % and 0.9% NaCl 100 mL/hr at 04/10/21 1644   piperacillin-tazobactam (ZOSYN)  IV Stopped (04/10/21 1635)    Procedures/Studies: CT ABDOMEN PELVIS W CONTRAST  Result Date: 04/08/2021 CLINICAL DATA:  Acute abdominal pain for several days EXAM: CT ABDOMEN AND PELVIS WITH CONTRAST TECHNIQUE: Multidetector CT imaging of the abdomen and pelvis was performed using the standard protocol following bolus administration of intravenous contrast. CONTRAST:  100mL OMNIPAQUE IOHEXOL 300 MG/ML  SOLN COMPARISON:  07/14/2017 FINDINGS: Lower chest: No acute abnormality. Hepatobiliary: Gallbladder has been surgically removed. The liver is within normal limits with the exception of significant biliary ductal dilatation. This continues to the level of the ampulla. Focal density is noted in the distal common bile duct suspicious for choledocholithiasis. Pancreas: Pancreas demonstrates diffuse fatty infiltration. Spleen: Normal in size without focal abnormality. Adrenals/Urinary Tract: Adrenal glands are unremarkable. Kidneys demonstrate no renal calculi. No obstructive changes are seen. Parapelvic cysts are noted bilaterally. The ureters are within normal limits bilaterally. The bladder is decompressed. Stomach/Bowel: Scattered diverticular change of the colon is noted without evidence of definitive diverticulitis. No obstructive or inflammatory changes of the colon are seen. The appendix has been surgically removed. Small bowel and stomach appear within normal limits. Vascular/Lymphatic: Aortic atherosclerosis. No enlarged abdominal or pelvic lymph nodes. Reproductive: Status post hysterectomy. No adnexal masses. Other: Small fat containing umbilical hernia is noted. No free fluid is noted.  Musculoskeletal: Postsurgical changes are noted in the proximal femurs. Degenerative change of the lumbar spine is noted. Mild anterolisthesis of L4 on L5 is seen. IMPRESSION: Significant intrahepatic and extrahepatic biliary ductal dilatation. On the coronal reconstructions there are findings suspicious for distal choledocholithiasis. The degree of dilatation is similar to that seen in 2018. Need for further workup can be determined on a clinical basis. Diverticulosis without diverticulitis. No other acute abnormality is noted. Electronically Signed   By: Inez Catalina M.D.   On: 04/08/2021 22:48  DG CHEST PORT 1 VIEW  Result Date: 04/09/2021 CLINICAL DATA:  Respiratory failure and hypoxia EXAM: PORTABLE CHEST 1 VIEW COMPARISON:  06/12/2020 FINDINGS: Cardiac shadow is enlarged. Aortic calcifications are noted as well as tortuosity stable from the prior exam. No focal infiltrate or sizable effusion is seen. No acute bony abnormality is noted. IMPRESSION: No acute abnormality noted. Electronically Signed   By: Inez Catalina M.D.   On: 04/09/2021 00:29   DG ERCP  Result Date: 04/10/2021 CLINICAL DATA:  87 year old female with choledocholithiasis. EXAM: ERCP TECHNIQUE: Multiple spot images obtained with the fluoroscopic device and submitted for interpretation post-procedure. FLUOROSCOPY TIME:  Fluoroscopy Time:  3 minutes 21 seconds Number of Acquired Spot Images: 0 COMPARISON:  CT abdomen/pelvis 04/08/2021 FINDINGS: Numerous intraoperative saved images and cine clips are submitted for review. The images demonstrate a flexible duodenal scope in the descending duodenum. Subsequent images demonstrate cannulation of the common bile duct, sphincterotomy and cholangiogram. The common hepatic and common bile duct are markedly dilated. Multiple filling defects are present in the common bile duct consistent with choledocholithiasis. Subsequent images demonstrate both basket sweeping and balloon sweeping of the common  duct. IMPRESSION: 1. Choledocholithiasis with intra and extrahepatic biliary ductal dilatation. 2. ERCP with sphincterotomy and combined basket and balloon sweeping of the common bile duct. These images were submitted for radiologic interpretation only. Please see the procedural report for the amount of contrast and the fluoroscopy time utilized. Electronically Signed   By: Jacqulynn Cadet M.D.   On: 04/10/2021 15:54    Orson Eva, DO  [facility]  If 7PM-7AM, please contact night-coverage [email or web address] Password [facility] 04/10/2021, 4:47 PM   LOS: 2 days

## 2021-04-10 NOTE — Anesthesia Preprocedure Evaluation (Signed)
Anesthesia Evaluation  Patient identified by MRN, date of birth, ID band Patient awake    Reviewed: Allergy & Precautions, NPO status , Patient's Chart, lab work & pertinent test results  History of Anesthesia Complications Negative for: history of anesthetic complications  Airway Mallampati: III  TM Distance: >3 FB Neck ROM: Full    Dental  (+) Dental Advisory Given, Missing   Pulmonary pneumonia, COPD,  COPD inhaler,    Pulmonary exam normal breath sounds clear to auscultation       Cardiovascular Exercise Tolerance: Poor METS (bed bound): hypertension, Pt. on medications + dysrhythmias Atrial Fibrillation  Rhythm:Irregular Rate:Normal     Neuro/Psych PSYCHIATRIC DISORDERS Anxiety Depression  Neuromuscular disease    GI/Hepatic Neg liver ROS, GERD  Medicated and Controlled,  Endo/Other  diabetes, Well Controlled, Type 2, Oral Hypoglycemic Agents  Renal/GU Renal InsufficiencyRenal disease     Musculoskeletal  (+) Arthritis  (sciatica ),   Abdominal   Peds  Hematology negative hematology ROS (+)   Anesthesia Other Findings   Reproductive/Obstetrics                             Anesthesia Physical Anesthesia Plan  ASA: 4  Anesthesia Plan: General   Post-op Pain Management:    Induction: Intravenous  PONV Risk Score and Plan: 4 or greater and Ondansetron  Airway Management Planned: Oral ETT  Additional Equipment:   Intra-op Plan:   Post-operative Plan: Extubation in OR  Informed Consent: I have reviewed the patients History and Physical, chart, labs and discussed the procedure including the risks, benefits and alternatives for the proposed anesthesia with the patient or authorized representative who has indicated his/her understanding and acceptance.    Discussed DNR with patient.   Dental advisory given  Plan Discussed with: CRNA and Surgeon  Anesthesia Plan Comments:  (Family doesn't want prolonged resuscitation in case of cardiac arrest. )        Anesthesia Quick Evaluation

## 2021-04-11 ENCOUNTER — Encounter (HOSPITAL_COMMUNITY): Payer: Self-pay | Admitting: Internal Medicine

## 2021-04-11 LAB — COMPREHENSIVE METABOLIC PANEL
ALT: 28 U/L (ref 0–44)
AST: 25 U/L (ref 15–41)
Albumin: 2.4 g/dL — ABNORMAL LOW (ref 3.5–5.0)
Alkaline Phosphatase: 203 U/L — ABNORMAL HIGH (ref 38–126)
Anion gap: 9 (ref 5–15)
BUN: 16 mg/dL (ref 8–23)
CO2: 26 mmol/L (ref 22–32)
Calcium: 8.3 mg/dL — ABNORMAL LOW (ref 8.9–10.3)
Chloride: 107 mmol/L (ref 98–111)
Creatinine, Ser: 1.43 mg/dL — ABNORMAL HIGH (ref 0.44–1.00)
GFR, Estimated: 35 mL/min — ABNORMAL LOW (ref >60)
Glucose, Bld: 212 mg/dL — ABNORMAL HIGH (ref 70–99)
Potassium: 4.5 mmol/L (ref 3.5–5.1)
Sodium: 142 mmol/L (ref 135–145)
Total Bilirubin: 1.3 mg/dL — ABNORMAL HIGH (ref 0.3–1.2)
Total Protein: 5.2 g/dL — ABNORMAL LOW (ref 6.5–8.1)

## 2021-04-11 LAB — CBC
HCT: 34.2 % — ABNORMAL LOW (ref 36.0–46.0)
Hemoglobin: 10.8 g/dL — ABNORMAL LOW (ref 12.0–15.0)
MCH: 29 pg (ref 26.0–34.0)
MCHC: 31.6 g/dL (ref 30.0–36.0)
MCV: 91.9 fL (ref 80.0–100.0)
Platelets: 325 10*3/uL (ref 150–400)
RBC: 3.72 MIL/uL — ABNORMAL LOW (ref 3.87–5.11)
RDW: 15.8 % — ABNORMAL HIGH (ref 11.5–15.5)
WBC: 6.9 10*3/uL (ref 4.0–10.5)
nRBC: 0 % (ref 0.0–0.2)

## 2021-04-11 LAB — GLUCOSE, CAPILLARY
Glucose-Capillary: 194 mg/dL — ABNORMAL HIGH (ref 70–99)
Glucose-Capillary: 141 mg/dL — ABNORMAL HIGH (ref 70–99)
Glucose-Capillary: 95 mg/dL (ref 70–99)

## 2021-04-11 LAB — AMYLASE: Amylase: 35 U/L (ref 28–100)

## 2021-04-11 MED ORDER — XARELTO 15 MG PO TABS: 15.0000 mg | ORAL_TABLET | Freq: Every day | ORAL | Status: AC

## 2021-04-11 MED ORDER — URSODIOL 300 MG PO CAPS: 300.0000 mg | ORAL_CAPSULE | Freq: Two times a day (BID) | ORAL | 1 refills | Status: DC

## 2021-04-11 MED ORDER — AMOXICILLIN-POT CLAVULANATE 875-125 MG PO TABS: 1.0000 | ORAL_TABLET | Freq: Two times a day (BID) | ORAL | 0 refills | Status: DC

## 2021-04-11 MED ORDER — AMOXICILLIN-POT CLAVULANATE 500-125 MG PO TABS: 500.0000 mg | ORAL_TABLET | Freq: Two times a day (BID) | ORAL | Status: DC

## 2021-04-11 NOTE — TOC Transition Note (Signed)
Transition of Care [facility]) - CM/SW Discharge Note   Patient Details  Name: [name] MRN: [id number] Date of Birth: 1928-12-13  Transition of [facility]) CM/SW Contact:  Boneta Lucks, RN Phone Number: 04/11/2021, 1:09 PM   Clinical Narrative:   Patient discharging home. Her daughter and Granddaughter lives with her.  She was recently approved for CAP aide, and her granddaughter is her aide. She is agreeable to home health.  She is requesting [facility]. Orders and facesheet faxed to [phone].   Final next level of care: [place] Barriers to Discharge: Barriers Resolved   Patient Goals and CMS Choice Patient states their goals for this hospitalization and ongoing recovery are:: to go home. CMS Medicare.gov Compare Post Acute Care list provided to:: Patient Choice offered to / list presented to : Patient  Discharge Placement           Patient to be transferred to facility by: family Name of family member notified: Granddaughter Patient and family notified of of transfer: 04/11/21  Discharge Plan and Services      [place]: [place] Date [facility] Agency Contacted: 04/11/21 Time [place]: [phone] Representative spoke with at [place]: Faxed to [place]  Readmission Risk Interventions Readmission Risk Prevention Plan 04/11/2021  Transportation Screening Complete  Home Care Screening Complete  Medication Review (RN CM) Complete  Some recent data might be hidden

## 2021-04-11 NOTE — Care Management Important Message (Signed)
Important Message  Patient Details  Name: [name] MRN: [id number] Date of Birth: 08-Sep-1928   Medicare Important Message Given:  Yes     Tommy Medal 04/11/2021, 2:01 PM

## 2021-04-11 NOTE — Progress Notes (Signed)
Patient being discharged to home. Paperwork given to patient and granddaughter. IV access removed without incident. Purewick removed. Family getting patient dressed for transport home.

## 2021-04-11 NOTE — Progress Notes (Signed)
Subjective: Feeling well this morning. No abdominal pain, nausea, or vomiting. Had a loose BM this morning. No brbpr or melena. Tolerated clear liquid diet well. Ready to try solid foods.   Objective: Vital signs in last 24 hours: Temp:  [97.5 F (36.4 C)-98.6 F (37 C)] 97.5 F (36.4 C) (08/16 0418) Pulse Rate:  [58-91] 62 (08/16 0800) Resp:  [13-24] 16 (08/16 0800) BP: (105-154)/(51-92) 107/71 (08/16 0800) SpO2:  [94 %-100 %] 97 % (08/16 0800) Weight:  [85.4 kg] 85.4 kg (08/16 0418) Last BM Date: 04/10/21 (Per PACU RN) General:   Alert and oriented, pleasant, NAD Head:  Normocephalic and atraumatic. Eyes:  No icterus, sclera clear. Conjuctiva pink.  Abdomen:  Bowel sounds present, soft, non-tender, non-distended. No HSM or hernias noted. No rebound or guarding. No masses appreciated  Msk:  Symmetrical without gross deformities. Normal posture. Extremities:  Without edema. Neurologic:  Alert and  oriented x4;  grossly normal neurologically. Skin:  Warm and dry, intact without significant lesions.  Psych: Normal mood and affect.  Intake/Output from previous day: 08/15 0701 - 08/16 0700 In: 1206.5 [I.V.:1078; IV Piggyback:128.5] Out: 1525 [Urine:1525] Intake/Output this shift: Total I/O In: 793.9 [P.O.:240; I.V.:454.8; IV Piggyback:99.1] Out: -   Lab Results: Recent Labs    04/09/21 0752 04/10/21 0430 04/11/21 0528  WBC 13.8* 7.1 6.9  HGB 11.6* 10.7* 10.8*  HCT 34.9* 32.7* 34.2*  PLT 354 306 325   BMET Recent Labs    04/10/21 0430 04/10/21 1639 04/11/21 0528  NA 136 139 142  K 3.3* 3.8 4.5  CL 103 107 107  CO2 _0 GLUCOSE 140* 137* 212*  BUN _1 CREATININE 1.50* 1.35* 1.43*  CALCIUM 7.4* 7.9* 8.3*   LFT Recent Labs    04/10/21 0430 04/10/21 1639 04/11/21 0528  PROT 5.0* 5.8* 5.2*  ALBUMIN 2.4* 2.6* 2.4*  AST 41 37 25  ALT 34 34 28  ALKPHOS 201* 228* 203*  BILITOT 2.3* 2.2* 1.3*   PT/INR Recent Labs    04/09/21 0524  04/10/21 0430  LABPROT 25.7* 16.9*  INR 2.3* 1.4*   Studies/Results: DG ERCP  Result Date: 04/10/2021 CLINICAL DATA:  87 year old female with choledocholithiasis. EXAM: ERCP TECHNIQUE: Multiple spot images obtained with the fluoroscopic device and submitted for interpretation post-procedure. FLUOROSCOPY TIME:  Fluoroscopy Time:  3 minutes 21 seconds Number of Acquired Spot Images: 0 COMPARISON:  CT abdomen/pelvis 04/08/2021 FINDINGS: Numerous intraoperative saved images and cine clips are submitted for review. The images demonstrate a flexible duodenal scope in the descending duodenum. Subsequent images demonstrate cannulation of the common bile duct, sphincterotomy and cholangiogram. The common hepatic and common bile duct are markedly dilated. Multiple filling defects are present in the common bile duct consistent with choledocholithiasis. Subsequent images demonstrate both basket sweeping and balloon sweeping of the common duct. IMPRESSION: 1. Choledocholithiasis with intra and extrahepatic biliary ductal dilatation. 2. ERCP with sphincterotomy and combined basket and balloon sweeping of the common bile duct. These images were submitted for radiologic interpretation only. Please see the procedural report for the amount of contrast and the fluoroscopy time utilized. Electronically Signed   By: Jacqulynn Cadet M.D.   On: 04/10/2021 15:54    Assessment: 87 year old female with history of COPD, CKD stage IIIb, atrial fibrillation, diabetes mellitus type 2, hypertension, hyperlipidemia, anxiety, choledocholithiasis s/p ERCP in 2018 showing biliary papillary stenosis, stone and sludge in the common bile duct s/p sphincterotomy and balloon extraction, now admitted on 04/08/21 with  acute cholangitis in the setting of recurrent choledocholithiasis.  She was started on IV Zosyn, blood cultures negative thus far.  ERCP 8/15 with prior biliary sphincterotomy appearing open, marked dilated CBD and CHD along with  dilated intrahepatic biliary radicles, biliary sphincterotomy extended by 5 mm, large amount of debris/sludge removed from bile duct.   LFTs improving with AST/ALT normal, alk phos down to 203, T bili down to 1.3.  She was started on Urso 250 mg twice daily which will be continued long-term.  Amylase within normal limits today.  WBC also normal today.  She is afebrile.  Clinically, she is feeling much improved since her procedure.  She is tolerating clear liquids well with no abdominal pain, nausea, or vomiting.  She is ready to try solid foods.  Dr. Carles Collet already placed order for carb modified diet.  Hoping to go home in the next 24 hours if she is able to tolerate her diet advancement.    Anemia: No overt GI bleeding. Likely secondary to acute illness/hydration effect.  Hemoglobin stable today.  Plan: Agree with advancing to carb modified diet today. Monitor for recurrent abdominal pain, nausea, or vomiting. Avoid aspirin and anticoagulation x3 days following ERCP. May resume on 8/19. Continue Urso 300 mg p.o. twice daily. Complete 7-day course of antibiotics. Continue to trend LFTs. Trend H/H and monitor for overt GI bleeding.    LOS: 3 days    04/11/2021, 12:03 PM   Aliene Altes, [facility]

## 2021-04-12 DIAGNOSIS — K8309 Other cholangitis: Secondary | ICD-10-CM | POA: Diagnosis not present

## 2021-04-13 ENCOUNTER — Telehealth: Payer: Self-pay | Admitting: Gastroenterology

## 2021-04-13 LAB — CULTURE, BLOOD (ROUTINE X 2)
Culture: NO GROWTH
Special Requests: ADEQUATE

## 2021-04-13 NOTE — Telephone Encounter (Signed)
Patient needs hospital follow-up in 4-6 weeks. Please arrange. Dx: Choledocholithiasis, cholangitis.

## 2021-04-14 LAB — CULTURE, BLOOD (ROUTINE X 2)
Culture: NO GROWTH
Special Requests: ADEQUATE

## 2021-04-17 ENCOUNTER — Encounter: Payer: Self-pay | Admitting: Gastroenterology

## 2021-04-25 ENCOUNTER — Telehealth: Payer: Self-pay | Admitting: Internal Medicine

## 2021-04-25 NOTE — Telephone Encounter (Signed)
Pt's PCP called (Dr Juel Burrow office) and wanted patient seen sooner than January due to her constipation. I told her that nothing was available any sooner and she is on the wait list if we have any cancellation. Please advise and call [phone]

## 2021-04-25 NOTE — Telephone Encounter (Signed)
On 04/13/21 [name] wanted this pt scheduled for a hospital follow-up in 4 to 6 weeks. The pt's PCP office called and wanted her seen before January due to her constipation. [name]'s Dx was choledocholithiasis and cholangitis. Well there aren't any appts available before January. Please advise

## 2021-05-02 NOTE — Telephone Encounter (Signed)
FYI: Phoned the pt's PCP (Dr. Nevada Crane) and spoke with [place] pt. Coordinator . I advised her I had spoke with the pt's daughter and let her know that we are booked but we put the pt on a cancellation list. Until her appt her per Aliene Altes, PA-C will defer management to PCP. She expresses understanding

## 2021-05-02 NOTE — Telephone Encounter (Signed)
[name], can we place patient on the cancellation list? Does Dr. Gala Romney or any APP have anything available?   Stann Ore, as last saw patient and requested hospital follow-up.

## 2021-05-02 NOTE — Telephone Encounter (Signed)
Communication noted.  

## 2021-05-02 NOTE — Telephone Encounter (Signed)
Just spoke with the pt's daughter and advised that the pt has been put on a cancellation list because we are booked up but as soon as something becomes available we will call

## 2021-05-02 NOTE — Telephone Encounter (Signed)
Communication noted. Constipation is not something we addressed during her hospitalization. Will need to be discussed at [facility]. Hopefully, we will be able to get her appointment moved up. For now, will defer management to PCP.

## 2021-05-09 DIAGNOSIS — E1165 Type 2 diabetes mellitus with hyperglycemia: Secondary | ICD-10-CM | POA: Diagnosis not present

## 2021-05-09 DIAGNOSIS — H9209 Otalgia, unspecified ear: Secondary | ICD-10-CM | POA: Diagnosis not present

## 2021-05-09 DIAGNOSIS — J449 Chronic obstructive pulmonary disease, unspecified: Secondary | ICD-10-CM | POA: Diagnosis not present

## 2021-05-09 DIAGNOSIS — R600 Localized edema: Secondary | ICD-10-CM | POA: Diagnosis not present

## 2021-05-09 DIAGNOSIS — I480 Paroxysmal atrial fibrillation: Secondary | ICD-10-CM | POA: Diagnosis not present

## 2021-05-09 DIAGNOSIS — G894 Chronic pain syndrome: Secondary | ICD-10-CM | POA: Diagnosis not present

## 2021-06-07 ENCOUNTER — Encounter: Payer: Self-pay | Admitting: Family Medicine

## 2021-06-07 DIAGNOSIS — I4891 Unspecified atrial fibrillation: Secondary | ICD-10-CM | POA: Diagnosis not present

## 2021-06-07 DIAGNOSIS — I129 Hypertensive chronic kidney disease with stage 1 through stage 4 chronic kidney disease, or unspecified chronic kidney disease: Secondary | ICD-10-CM | POA: Diagnosis not present

## 2021-06-07 DIAGNOSIS — E1165 Type 2 diabetes mellitus with hyperglycemia: Secondary | ICD-10-CM | POA: Diagnosis not present

## 2021-06-07 DIAGNOSIS — K219 Gastro-esophageal reflux disease without esophagitis: Secondary | ICD-10-CM | POA: Diagnosis not present

## 2021-06-07 DIAGNOSIS — E785 Hyperlipidemia, unspecified: Secondary | ICD-10-CM | POA: Diagnosis not present

## 2021-06-07 DIAGNOSIS — H548 Legal blindness, as defined in USA: Secondary | ICD-10-CM | POA: Diagnosis not present

## 2021-06-07 DIAGNOSIS — J449 Chronic obstructive pulmonary disease, unspecified: Secondary | ICD-10-CM | POA: Diagnosis not present

## 2021-06-07 DIAGNOSIS — R350 Frequency of micturition: Secondary | ICD-10-CM | POA: Diagnosis not present

## 2021-06-07 DIAGNOSIS — F419 Anxiety disorder, unspecified: Secondary | ICD-10-CM | POA: Diagnosis not present

## 2021-06-07 DIAGNOSIS — E1122 Type 2 diabetes mellitus with diabetic chronic kidney disease: Secondary | ICD-10-CM | POA: Diagnosis not present

## 2021-06-07 DIAGNOSIS — N390 Urinary tract infection, site not specified: Secondary | ICD-10-CM | POA: Diagnosis not present

## 2021-06-07 DIAGNOSIS — M25572 Pain in left ankle and joints of left foot: Secondary | ICD-10-CM | POA: Diagnosis not present

## 2021-06-07 DIAGNOSIS — N184 Chronic kidney disease, stage 4 (severe): Secondary | ICD-10-CM | POA: Diagnosis not present

## 2021-06-15 DIAGNOSIS — N184 Chronic kidney disease, stage 4 (severe): Secondary | ICD-10-CM | POA: Diagnosis not present

## 2021-06-15 DIAGNOSIS — Z23 Encounter for immunization: Secondary | ICD-10-CM | POA: Diagnosis not present

## 2021-06-15 DIAGNOSIS — I480 Paroxysmal atrial fibrillation: Secondary | ICD-10-CM | POA: Diagnosis not present

## 2021-06-15 DIAGNOSIS — G894 Chronic pain syndrome: Secondary | ICD-10-CM | POA: Diagnosis not present

## 2021-06-15 DIAGNOSIS — E1140 Type 2 diabetes mellitus with diabetic neuropathy, unspecified: Secondary | ICD-10-CM | POA: Diagnosis not present

## 2021-06-15 DIAGNOSIS — R600 Localized edema: Secondary | ICD-10-CM | POA: Diagnosis not present

## 2021-06-15 DIAGNOSIS — K219 Gastro-esophageal reflux disease without esophagitis: Secondary | ICD-10-CM | POA: Diagnosis not present

## 2021-06-15 DIAGNOSIS — E1165 Type 2 diabetes mellitus with hyperglycemia: Secondary | ICD-10-CM | POA: Diagnosis not present

## 2021-06-15 DIAGNOSIS — N390 Urinary tract infection, site not specified: Secondary | ICD-10-CM | POA: Diagnosis not present

## 2021-06-15 DIAGNOSIS — I10 Essential (primary) hypertension: Secondary | ICD-10-CM | POA: Diagnosis not present

## 2021-06-15 DIAGNOSIS — E785 Hyperlipidemia, unspecified: Secondary | ICD-10-CM | POA: Diagnosis not present

## 2021-06-15 DIAGNOSIS — F419 Anxiety disorder, unspecified: Secondary | ICD-10-CM | POA: Diagnosis not present

## 2021-06-16 DIAGNOSIS — R300 Dysuria: Secondary | ICD-10-CM | POA: Diagnosis not present

## 2021-06-22 ENCOUNTER — Encounter: Payer: Self-pay | Admitting: *Deleted

## 2021-06-22 NOTE — Progress Notes (Signed)
Cardiology Office Note  Date: 06/23/2021   ID: PERL FOLMAR, DOB 10/27/1928, MRN [id number]  PCP:  Celene Squibb, MD  Cardiologist:  Carlyle Dolly, MD Electrophysiologist:  None   Chief Complaint: Paroxysmal atrial fibrillation  History of Present Illness: [name] is a 87 y.o. female with a history of HTN, CKD, COPD, HLD, DM2, PAF.  She was last seen by Dr. Harl Bowie on 12/29/2019 referred by Dr. Nevada Crane for atrial fibrillation.  History of atrial fibrillation noted.  November 2018.  She did not require AV nodal agents.  She was to start anticoagulation but apparently it never started.  She had recently been started on Xarelto 15 mg by PCP.  Creatinine clearance was 32.  She was on the correct dose of anticoagulation.  She had possible descending aortic dissection during 06/2017 admission with suggestive findings on MRI but not present on CT.  Cannot use contrast due to renal dysfunction.  Advanced age and comorbidities would not repeat any imaging to reassess.  Plan was to monitor.  Rate was controlled.  No plan for DC cardioversion.  She had a recent admission in August for sepsis secondary to biliary/intra-abdominal source.  She received Zosyn and IV fluids.  She was discharged home on Augmentin for 5 days.  She had transaminitis and acute cholangitis.  She had CT of abdomen pelvis demonstrated a focal density at common bile duct suspicious for possible choledocholithiasis.  She had ERCP with large amount of debris/sludge removed from bilateral prior sphincterotomy was extended 5 mm.  Case was discussed with Dr. Laural Golden.  Xarelto was held for ERCP.  She was to restart Xarelto on 04/13/2021.  Her statin was held during hospitalization but restarted after discharge.  Her amlodipine was held during hospitalization and eventually restarted.  She is here for follow-up on her paroxysmal atrial fibrillation.  Her daughter states she has not been here in quite some time due to multiple health  issues including COVID 19, femur fracture, issues with her abdomen, recent sepsis secondary to biliary/intra-abdominal source.  She remains in atrial fibrillation with a controlled rate today.  Currently not on AV nodal blockers EKG atrial fibrillation with a rate of 69, low voltage QRS.  She does not feel any palpitations or arrhythmias or rapid heart rates.  Her blood pressure is well controlled.  She has some occasional lower extremity edema and takes furosemide as needed.  Denies any bleeding on Xarelto.  She continues on Xarelto 15 mg daily.  Blood pressures well controlled on current therapy at 118/82.  Continuing amlodipine 5 mg daily.  Blood sugar has not been well controlled.  Most recent hemoglobin A1c was 7.1% which was an improvement from prior of 7.9%.  Her creatinine is 1.51 with a GFR of 33.  She has an upcoming follow-up with nephrology.  She also has an upcoming follow-up with gastroenterology for recent biliary infection and ERCP.  She is minimally ambulatory.  She can walk from her bed to the bathroom or from chair to kitchen.  Her eyesight is not good.  Past Medical History:  Diagnosis Date   Acid reflux    Anxiety    CKD (chronic kidney disease)    COPD (chronic obstructive pulmonary disease) (HCC)    Depressed    Diabetes mellitus    Glaucoma    Hypercholesteremia    Hypertension    Legally blind in right eye, as defined in [place]    Panic attacks    Type 2  diabetes mellitus ([place])     Past Surgical History:  Procedure Laterality Date   APPENDECTOMY     BALLOON DILATION  07/16/2017   Procedure: BALLOON DILATION;  Surgeon: Danie Binder, MD;  [facility];  Service: Endoscopy;;   cataracts     CHOLECYSTECTOMY     ERCP N/A 07/16/2017   biliary papillary stenosis, filling defect c/w a stone and sludge, entire biliary tree dilated. completed biliary sphincterotomy and balloon extraction, stone removal   ERCP N/A 04/10/2021   Procedure: ENDOSCOPIC RETROGRADE  CHOLANGIOPANCREATOGRAPHY (ERCP);  Surgeon: Rogene Houston, MD;  Location: [facility];  Service: Endoscopy;  Laterality: N/A;   ESOPHAGOGASTRODUODENOSCOPY  2006   Dr. Gala Romney: normal esophagus s/p empiric dilataton, small hiatal hernia   HIP SURGERY     rod in leg as well   INTRAMEDULLARY (IM) NAIL INTERTROCHANTERIC Left 06/13/2020   Procedure: INTRAMEDULLARY (IM) NAIL INTERTROCHANTRIC;  Surgeon: [name], MD;  Location: [facility];  Service: Orthopedics;  Laterality: Left;   MASS EXCISION Left 09/26/2017   Procedure: EXCISION MUCOID CYST WITH DISTAL INTERPHALANGEAL JOINT ARTHROTOMY OF LEFT MIDDLE FINGER;  Surgeon: Leanora Cover, MD;  Location: [facility];  Service: Orthopedics;  Laterality: Left;   SPHINCTEROTOMY  07/16/2017   Procedure: SPHINCTEROTOMY;  Surgeon: Danie Binder, MD;  [facility];  Service: Endoscopy;;   SPHINCTEROTOMY N/A 04/10/2021   Procedure: Joan Mayans;  Surgeon: Rogene Houston, MD;  Location: [facility];  Service: Endoscopy;  Laterality: N/A;   TUBAL LIGATION      Current Outpatient Medications  Medication Sig Dispense Refill   albuterol (VENTOLIN HFA) 108 (90 Base) MCG/ACT inhaler Inhale 2 puffs into the lungs every 4 (four) hours as needed for wheezing or shortness of breath.     amLODipine (NORVASC) 5 MG tablet Take 5 mg by mouth daily.     Difluprednate (DUREZOL) 0.05 % EMUL Apply 1 drop to eye daily.     furosemide (LASIX) 20 MG tablet Take 20 mg daily as needed by mouth for fluid.     gabapentin (NEURONTIN) 100 MG capsule TAKE 1 CAPSULE BY MOUTH THREE TIMES A DAY (Patient taking differently: Take 100 mg by mouth 3 (three) times daily.) 60 capsule 5   glipiZIDE (GLUCOTROL) 5 MG tablet Take 5 mg by mouth 2 (two) times daily.     LOKELMA 10 g PACK packet Take 1 packet by mouth daily.     omeprazole (PRILOSEC) 20 MG capsule Take 20 mg by mouth daily.     ondansetron (ZOFRAN) 4 MG tablet Take 4 mg by mouth every 8 (eight) hours as needed for  nausea/vomiting.     pravastatin (PRAVACHOL) 40 MG tablet Take 40 mg by mouth daily.     sertraline (ZOLOFT) 25 MG tablet Take 25 mg by mouth daily.     traMADol (ULTRAM) 50 MG tablet Take 1 tablet (50 mg total) by mouth every 12 (twelve) hours as needed for severe pain. 15 tablet 0   TRESIBA FLEXTOUCH 100 UNIT/ML FlexTouch Pen Inject 10 Units into the skin daily.      ursodiol (ACTIGALL) 300 MG capsule Take 1 capsule (300 mg total) by mouth 2 (two) times daily. 60 capsule 1   XARELTO 15 MG TABS tablet Take 1 tablet (15 mg total) by mouth daily. Restart on 04/13/2021 42 tablet    No current facility-administered medications for this visit.   Allergies:  Hydromorphone hcl   Social History: The patient  reports that she  has never smoked. She has never used smokeless tobacco. She reports that she does not drink alcohol and does not use drugs.   Family History: The patient's family history includes Alzheimer's disease in her brother; Blindness in her father; Bone cancer in her father; Brain cancer in her sister.   ROS:  Please see the history of present illness. Otherwise, complete review of systems is positive for none.  All other systems are reviewed and negative.   Physical Exam: VS:  BP 118/82   Pulse 80   Ht 5\' 3"  (1.6 m)   Wt 183 lb (83 kg)   SpO2 95%   BMI 32.42 kg/m , BMI Body mass index is 32.42 kg/m.  Wt Readings from Last 3 Encounters:  06/23/21 183 lb (83 kg)  04/11/21 188 lb 4.4 oz (85.4 kg)  08/17/20 189 lb (85.7 kg)    General: Patient appears comfortable at rest. Neck: Supple, no elevated JVP or carotid bruits, no thyromegaly. Lungs: Clear to auscultation, nonlabored breathing at rest. Cardiac: Regular rate and rhythm, no S3 or significant systolic murmur, no pericardial rub. Extremities: No pitting edema, distal pulses 2+. Skin: Warm and dry. Musculoskeletal: No kyphosis. Neuropsychiatric: Alert and oriented x3, affect grossly appropriate.  ECG: 06/23/2021  atrial fibrillation with a rate of 69.  Recent Labwork: 04/09/2021: Magnesium 1.3 04/11/2021: ALT 28; AST 25; BUN 16; Creatinine, Ser 1.43; Hemoglobin 10.8; Platelets 325; Potassium 4.5; Sodium 142  No results found for: CHOL, TRIG, HDL, CHOLHDL, VLDL, LDLCALC, LDLDIRECT  Other Studies Reviewed Today:  ERCP 04/10/2021 IMPRESSION: 1. Choledocholithiasis with intra and extrahepatic biliary ductal dilatation. 2. ERCP with sphincterotomy and combined basket and balloon sweeping of the common bile duct.   These images were submitted for radiologic interpretation only. Please see the procedural report for the amount of contrast and the fluoroscopy time utilized.        Echocardiogram 07/16/2017.  Since  - Left ventricle: The cavity size was normal. Wall thickness was    increased in a pattern of severe LVH. Systolic function was    vigorous. The estimated ejection fraction was in the range of 65%    to 70%. Wall motion was normal; there were no regional wall    motion abnormalities.  - Aortic valve: Valve area (VTI): 2.32 cm^2. Valve area (Vmax): 2.2    cm^2. Valve area (Vmean): 2.02 cm^2.  - Left atrium: The atrium was severely dilated.  - Technically difficult study. Echocontrast was used to enhance    visualization.   Assessment and Plan:  1. Paroxysmal atrial fibrillation (HCC)   2. Essential hypertension   3. Chronic kidney disease, stage 3b ([place])    1. Paroxysmal atrial fibrillation (HCC) History of paroxysmal atrial fibrillation.  Not on AV nodal blocker.  EKG today shows atrial fibrillation with a rate of 69.  She is continuing Xarelto for stroke prophylaxis at 15 mg daily.  2. Essential hypertension Blood pressure well controlled today at 118/82.  Continue amlodipine 5 mg p.o. daily.  3.  Chronic kidney disease stage IIIb. Recent creatinine 1.43.  Daughter states she has an upcoming visit with nephrology for follow-up.  Medication Adjustments/Labs and Tests  Ordered: Current medicines are reviewed at length with the patient today.  Concerns regarding medicines are outlined above.   Disposition: Follow-up with Dr. Harl Bowie or APP 6 months  Signed, Levell July, NP 06/23/2021 2:51 PM    [facility], [place], [place] [address] Phone: [phone])  [phone]; Fax: [phone]

## 2021-06-23 ENCOUNTER — Ambulatory Visit (INDEPENDENT_AMBULATORY_CARE_PROVIDER_SITE_OTHER): Payer: Medicare Other | Admitting: Family Medicine

## 2021-06-23 ENCOUNTER — Encounter: Payer: Self-pay | Admitting: Family Medicine

## 2021-06-23 ENCOUNTER — Other Ambulatory Visit: Payer: Self-pay

## 2021-06-23 VITALS — BP 118/82 | HR 80 | Ht 63.0 in | Wt 183.0 lb

## 2021-06-23 DIAGNOSIS — I480 Paroxysmal atrial fibrillation: Secondary | ICD-10-CM

## 2021-06-23 DIAGNOSIS — N1832 Chronic kidney disease, stage 3b: Secondary | ICD-10-CM | POA: Diagnosis not present

## 2021-06-23 DIAGNOSIS — I10 Essential (primary) hypertension: Secondary | ICD-10-CM

## 2021-06-23 DIAGNOSIS — E782 Mixed hyperlipidemia: Secondary | ICD-10-CM

## 2021-06-23 NOTE — Patient Instructions (Addendum)
Medication Instructions:  Your physician recommends that you continue on your current medications as directed. Please refer to the Current Medication list given to you today.  Labwork: none  Testing/Procedures: none  Follow-Up: Your physician recommends that you schedule a follow-up appointment in: 6 month  Any Other Special Instructions Will Be Listed Below (If Applicable).  If you need a refill on your cardiac medications before your next appointment, please call your pharmacy. 

## 2021-07-18 DIAGNOSIS — E875 Hyperkalemia: Secondary | ICD-10-CM | POA: Diagnosis not present

## 2021-07-18 DIAGNOSIS — E1129 Type 2 diabetes mellitus with other diabetic kidney complication: Secondary | ICD-10-CM | POA: Diagnosis not present

## 2021-07-18 DIAGNOSIS — N189 Chronic kidney disease, unspecified: Secondary | ICD-10-CM | POA: Diagnosis not present

## 2021-07-18 DIAGNOSIS — R600 Localized edema: Secondary | ICD-10-CM | POA: Diagnosis not present

## 2021-07-18 DIAGNOSIS — I9589 Other hypotension: Secondary | ICD-10-CM | POA: Diagnosis not present

## 2021-07-18 DIAGNOSIS — E211 Secondary hyperparathyroidism, not elsewhere classified: Secondary | ICD-10-CM | POA: Diagnosis not present

## 2021-07-18 DIAGNOSIS — R809 Proteinuria, unspecified: Secondary | ICD-10-CM | POA: Diagnosis not present

## 2021-07-18 DIAGNOSIS — E1122 Type 2 diabetes mellitus with diabetic chronic kidney disease: Secondary | ICD-10-CM | POA: Diagnosis not present

## 2021-07-18 DIAGNOSIS — I129 Hypertensive chronic kidney disease with stage 1 through stage 4 chronic kidney disease, or unspecified chronic kidney disease: Secondary | ICD-10-CM | POA: Diagnosis not present

## 2021-07-19 ENCOUNTER — Other Ambulatory Visit (HOSPITAL_COMMUNITY): Payer: Self-pay | Admitting: Nephrology

## 2021-07-19 DIAGNOSIS — R600 Localized edema: Secondary | ICD-10-CM

## 2021-08-07 ENCOUNTER — Other Ambulatory Visit: Payer: Self-pay

## 2021-08-07 ENCOUNTER — Ambulatory Visit
Admission: EM | Admit: 2021-08-07 | Discharge: 2021-08-07 | Disposition: A | Discharge status: Home / Self Care | Payer: Medicare Other | Attending: Family Medicine | Admitting: Family Medicine

## 2021-08-07 DIAGNOSIS — J069 Acute upper respiratory infection, unspecified: Secondary | ICD-10-CM

## 2021-08-07 DIAGNOSIS — J441 Chronic obstructive pulmonary disease with (acute) exacerbation: Secondary | ICD-10-CM | POA: Diagnosis not present

## 2021-08-07 DIAGNOSIS — Z20828 Contact with and (suspected) exposure to other viral communicable diseases: Secondary | ICD-10-CM | POA: Diagnosis not present

## 2021-08-07 MED ORDER — LIDOCAINE VISCOUS HCL 2 % MT SOLN: 5.0000 mL | OROMUCOSAL | 0 refills | Status: DC | PRN

## 2021-08-07 MED ORDER — DEXAMETHASONE SODIUM PHOSPHATE 10 MG/ML IJ SOLN
10.0000 mg | Freq: Once | INTRAMUSCULAR | Status: AC
  Administered 2021-08-07: 10 mg via INTRAMUSCULAR

## 2021-08-07 MED ORDER — PROMETHAZINE-DM 6.25-15 MG/5ML PO SYRP: 5.0000 mL | ORAL_SOLUTION | Freq: Four times a day (QID) | ORAL | 0 refills | Status: DC | PRN

## 2021-08-07 MED ORDER — AZITHROMYCIN 250 MG PO TABS: ORAL_TABLET | ORAL | 0 refills | Status: DC

## 2021-08-07 NOTE — ED Triage Notes (Signed)
Patient states she can not stop coughing for the past 4 to 5 days. She has been coughing up a lot of mucus.   She states she has a sore throat with a sore tongue.   She has been diagnosed with acute bronchitis  Denies Fever.

## 2021-08-07 NOTE — ED Provider Notes (Signed)
[facility]    CSN: [id number] Arrival date & time: 08/07/21  1611      History   Chief Complaint No chief complaint on file.   HPI [name] is a 87 y.o. female.   Presenting today with 5-day history of hacking cough, chest tightness, wheezing, shortness of breath with ambulation.  States her cough has been very productive the past few days.  Also having some fatigue, nasal congestion, tongue soreness and swelling.  Denies fever, chills, body aches, chest pain, abdominal pain, nausea vomiting or diarrhea.  No known sick contacts recently.  Has a history of COPD on albuterol as needed.  She has been using her inhaler quite often since onset of symptoms and it provides short-term relief.  Notes that her albuterol inhaler when she uses it frequently does cause soreness and swelling to her tongue which is currently happening.   Past Medical History:  Diagnosis Date   Acid reflux    Anxiety    CKD (chronic kidney disease)    COPD (chronic obstructive pulmonary disease) (HCC)    Depressed    Diabetes mellitus    Glaucoma    Hypercholesteremia    Hypertension    Legally blind in right eye, as defined in [place]    Panic attacks    Type 2 diabetes mellitus ([place])     Patient Active Problem List   Diagnosis Date Noted   Sepsis due to undetermined organism ([place]) 04/09/2021   Acute respiratory failure with hypoxia ([place])    Sepsis without acute organ dysfunction ([place])    Acute cholangitis 04/08/2021   Unspecified atrial fibrillation ([place]) 04/08/2021   Vitamin D deficiency 06/14/2020   Closed comminuted intertrochanteric fracture of left femur ([place]) 06/12/2020   Osteoarthritis of finger of left hand 11/06/2017   Choledocholithiasis    Common bile duct dilatation 07/14/2017   Essential hypertension 07/14/2017   Elevated LFTs 07/14/2017   Chronic kidney disease, stage 3b ([place]) 07/14/2017   CAP (community acquired pneumonia) 06/30/2017   Hypercholesteremia 06/30/2017    Glaucoma 06/30/2017   Anxiety 06/30/2017   Acid reflux 06/30/2017   Primary open angle glaucoma of both eyes, severe stage 06/03/2013   Other and combined forms of senile cataract 09/10/2011   Osteoarthritis 07/21/2011   Altered mental status 07/21/2011   COPD (chronic obstructive pulmonary disease) ([place]) 07/21/2011   UTI (lower urinary tract infection) 07/21/2011   Heart failure ([place]) 07/09/2011   Hyperkalemia 07/09/2011   Type II diabetes mellitus with hypoglycemia ([place]) 07/09/2011   Cataract 06/04/2011   TOTAL HIP FOLLOW-UP 04/14/2008   HIP PAIN 03/17/2008   SCIATICA 03/17/2008   CUBITAL TUNNEL SYNDROME 02/10/2008   KNEE, ARTHRITIS, DEGEN./OSTEO 02/10/2008   KNEE PAIN 02/10/2008   NECK PAIN 12/24/2007   SHOULDER PAIN 11/17/2007   RUPTURE ROTATOR CUFF 11/17/2007   Type 2 diabetes mellitus ([place]) 05/13/2007    Past Surgical History:  Procedure Laterality Date   APPENDECTOMY     BALLOON DILATION  07/16/2017   Procedure: BALLOON DILATION;  Surgeon: Danie Binder, MD;  [facility];  Service: Endoscopy;;   cataracts     CHOLECYSTECTOMY     ERCP N/A 07/16/2017   biliary papillary stenosis, filling defect c/w a stone and sludge, entire biliary tree dilated. completed biliary sphincterotomy and balloon extraction, stone removal   ERCP N/A 04/10/2021   Procedure: ENDOSCOPIC RETROGRADE CHOLANGIOPANCREATOGRAPHY (ERCP);  Surgeon: Rogene Houston, MD;  Location: [facility];  Service: Endoscopy;  Laterality: N/A;  ESOPHAGOGASTRODUODENOSCOPY  2006   Dr. Gala Romney: normal esophagus s/p empiric dilataton, small hiatal hernia   HIP SURGERY     rod in leg as well   INTRAMEDULLARY (IM) NAIL INTERTROCHANTERIC Left 06/13/2020   Procedure: INTRAMEDULLARY (IM) NAIL INTERTROCHANTRIC;  Surgeon: [name], MD;  Location: [place];  Service: Orthopedics;  Laterality: Left;   MASS EXCISION Left 09/26/2017   Procedure: EXCISION MUCOID CYST WITH DISTAL INTERPHALANGEAL JOINT ARTHROTOMY OF LEFT  MIDDLE FINGER;  Surgeon: Leanora Cover, MD;  Location: [place];  Service: Orthopedics;  Laterality: Left;   SPHINCTEROTOMY  07/16/2017   Procedure: SPHINCTEROTOMY;  Surgeon: Danie Binder, MD;  [facility];  Service: Endoscopy;;   SPHINCTEROTOMY N/A 04/10/2021   Procedure: Joan Mayans;  Surgeon: Rogene Houston, MD;  [facility];  Service: Endoscopy;  Laterality: N/A;   TUBAL LIGATION      OB History   No obstetric history on file.      Home Medications    Prior to Admission medications   Medication Sig Start Date End Date Taking? Authorizing Provider  azithromycin (ZITHROMAX) 250 MG tablet Take first 2 tablets together, then 1 every day until finished. 08/07/21  Yes Volney American, PA-C  lidocaine (XYLOCAINE) 2 % solution Use as directed 5 mLs in the mouth or throat as needed for mouth pain. 08/07/21  Yes Volney American, PA-C  promethazine-dextromethorphan (PROMETHAZINE-DM) 6.25-15 MG/5ML syrup Take 5 mLs by mouth 4 (four) times daily as needed. 08/07/21  Yes Volney American, PA-C  albuterol (VENTOLIN HFA) 108 (90 Base) MCG/ACT inhaler Inhale 2 puffs into the lungs every 4 (four) hours as needed for wheezing or shortness of breath. 03/31/21   [provider]  amLODipine (NORVASC) 5 MG tablet Take 5 mg by mouth daily.    [provider]  Difluprednate (DUREZOL) 0.05 % EMUL Apply 1 drop to eye daily.    [provider]  furosemide (LASIX) 20 MG tablet Take 20 mg daily as needed by mouth for fluid.    [provider]  gabapentin (NEURONTIN) 100 MG capsule TAKE 1 CAPSULE BY MOUTH THREE TIMES A DAY Patient taking differently: Take 100 mg by mouth 3 (three) times daily. 03/20/21   Carole Civil, MD  glipiZIDE (GLUCOTROL) 5 MG tablet Take 5 mg by mouth 2 (two) times daily.    [provider]  LOKELMA 10 g PACK packet Take 1 packet by mouth daily. 03/17/20   [provider]   omeprazole (PRILOSEC) 20 MG capsule Take 20 mg by mouth daily.    [provider]  ondansetron (ZOFRAN) 4 MG tablet Take 4 mg by mouth every 8 (eight) hours as needed for nausea/vomiting. 05/13/20   [provider]  pravastatin (PRAVACHOL) 40 MG tablet Take 40 mg by mouth daily.    [provider]  sertraline (ZOLOFT) 25 MG tablet Take 25 mg by mouth daily. 02/21/21   [provider]  traMADol (ULTRAM) 50 MG tablet Take 1 tablet (50 mg total) by mouth every 12 (twelve) hours as needed for severe pain. 10/13/19   Wurst, [place], PA-C  TRESIBA FLEXTOUCH 100 UNIT/ML FlexTouch Pen Inject 10 Units into the skin daily.  12/08/19   [provider]  ursodiol (ACTIGALL) 300 MG capsule Take 1 capsule (300 mg total) by mouth 2 (two) times daily. 04/11/21   Orson Eva, MD  XARELTO 15 MG TABS tablet Take 1 tablet (15 mg total) by mouth daily. Restart on 04/13/2021  04/13/21   Orson Eva, MD    Family History Family History  Problem Relation Age of Onset   Blindness Father    Bone cancer Father    Brain cancer Sister    Alzheimer's disease Brother    Colon cancer Neg Hx     Social History Social History   Tobacco Use   Smoking status: Never   Smokeless tobacco: Never  Vaping Use   Vaping Use: Never used  Substance Use Topics   Alcohol use: No   Drug use: No     Allergies   Hydromorphone hcl   Review of Systems Review of Systems Per HPI  Physical Exam Triage Vital Signs ED Triage Vitals  Enc Vitals Group     BP 08/07/21 1839 119/79     Pulse Rate 08/07/21 1839 73     Resp 08/07/21 1839 16     Temp 08/07/21 1839 98.2 F (36.8 C)     Temp Source 08/07/21 1839 Oral     SpO2 08/07/21 1839 95 %     Weight --      Height --      Head Circumference --      Peak Flow --      Pain Score 08/07/21 1834 5     Pain Loc --      Pain Edu? --      Excl. in [place]? --    No data found.  Updated Vital Signs BP 119/79 (BP Location: Left Arm)    Pulse 73   Temp 98.2 F (36.8 C) (Oral)   Resp 16   SpO2 95%   Visual Acuity Right Eye Distance:   Left Eye Distance:   Bilateral Distance:    Right Eye Near:   Left Eye Near:    Bilateral Near:     Physical Exam Vitals and nursing note reviewed.  Constitutional:      Appearance: Normal appearance.  HENT:     Head: Atraumatic.     Right Ear: Tympanic membrane and external ear normal.     Left Ear: Tympanic membrane and external ear normal.     Nose: Rhinorrhea present.     Mouth/Throat:     Mouth: Mucous membranes are moist.     Pharynx: Posterior oropharyngeal erythema present.  Eyes:     Extraocular Movements: Extraocular movements intact.     Conjunctiva/sclera: Conjunctivae normal.  Cardiovascular:     Rate and Rhythm: Normal rate and regular rhythm.     Heart sounds: Normal heart sounds.  Pulmonary:     Effort: Pulmonary effort is normal.     Breath sounds: Wheezing present. No rales.     Comments: Breathing comfortably on room air, speaking in full sentences Musculoskeletal:        General: Normal range of motion.     Cervical back: Normal range of motion and neck supple.  Skin:    General: Skin is warm and dry.  Neurological:     Mental Status: She is alert and oriented to person, place, and time.  Psychiatric:        Mood and Affect: Mood normal.        Thought Content: Thought content normal.     UC Treatments / Results  Labs (all labs ordered are listed, but only abnormal results are displayed) Labs Reviewed  COVID-19, FLU A+B NAA    EKG   Radiology No results found.  Procedures Procedures (including critical care time)  Medications Ordered in  UC Medications  dexamethasone (DECADRON) injection 10 mg (has no administration in time range)    Initial Impression / Assessment and Plan / UC Course  I have reviewed the triage vital signs and the nursing notes.  Pertinent labs & imaging results that were available during my care of the  patient were reviewed by me and considered in my medical decision making (see chart for details).     Overall vital signs reassuring, consistent with viral upper respiratory infection causing a COPD exacerbation.  We will treat with IM Decadron,azithromycin, Phenergan DM, albuterol inhaler as needed.  Will send viscous lidocaine for tongue soreness from albuterol inhaler use.  Discussed supportive home care and strict return precautions.  COVID and flu testing pending.  Final Clinical Impressions(s) / UC Diagnoses   Final diagnoses:  Exposure to the flu  COPD exacerbation ([place])  Viral URI with cough   Discharge Instructions   None    ED Prescriptions     Medication Sig Dispense Auth. Provider   azithromycin (ZITHROMAX) 250 MG tablet Take first 2 tablets together, then 1 every day until finished. 6 tablet Volney American, [place]   promethazine-dextromethorphan (PROMETHAZINE-DM) 6.25-15 MG/5ML syrup Take 5 mLs by mouth 4 (four) times daily as needed. 100 mL Volney American, PA-C   lidocaine (XYLOCAINE) 2 % solution Use as directed 5 mLs in the mouth or throat as needed for mouth pain. 100 mL Volney American, [place]      PDMP not reviewed this encounter.   Volney American, [place] 08/07/21 1911

## 2021-08-08 LAB — COVID-19, FLU A+B NAA
Influenza A, NAA: NOT DETECTED
Influenza B, NAA: NOT DETECTED
SARS-CoV-2, NAA: NOT DETECTED

## 2021-08-31 ENCOUNTER — Ambulatory Visit (HOSPITAL_COMMUNITY)
Admission: RE | Admit: 2021-08-31 | Discharge: 2021-08-31 | Disposition: A | Discharge status: Home / Self Care | Payer: Medicare Other | Source: Ambulatory Visit | Attending: Nephrology | Admitting: Nephrology

## 2021-08-31 ENCOUNTER — Other Ambulatory Visit: Payer: Self-pay

## 2021-08-31 DIAGNOSIS — R600 Localized edema: Secondary | ICD-10-CM | POA: Insufficient documentation

## 2021-08-31 LAB — ECHOCARDIOGRAM COMPLETE
AR max vel: 2.07 cm2
AV Area VTI: 2.35 cm2
AV Area mean vel: 2.14 cm2
AV Mean grad: 2.3 mmHg
AV Peak grad: 4.8 mmHg
Ao pk vel: 1.1 m/s
Area-P 1/2: 4.21 cm2
P 1/2 time: 621 msec
S' Lateral: 3 cm

## 2021-08-31 NOTE — Progress Notes (Signed)
*  PRELIMINARY RESULTS* Echocardiogram 2D Echocardiogram has been performed.  [name] 08/31/2021, 11:27 AM

## 2021-09-02 NOTE — Progress Notes (Signed)
Referring Provider: Celene Squibb, MD Primary Care Physician:  Celene Squibb, MD Primary GI Physician: Dr. Gala Romney  Chief Complaint  Patient presents with   Constipation   Abdominal Pain    Upper and lower abd    HPI:   [name] is a 88 y.o. female with history of COPD, CKD, atrial fibrillation, diabetes mellitus type 2, hypertension, hyperlipidemia, anxiety, choledocholithiasis s/p ERCP in 2018 showing biliary papillary stenosis, stone and sludge in the common bile duct s/p sphincterotomy and balloon extraction, admitted in August 2022 with acute cholangitis in the setting of recurrent choledocholithiasis/CBD debris, presenting today for hospital follow-up.   She was admitted 8/14-8/16. LFTs elevated on admission- AST 97, ALT 63, alk phos 349, t bilirubin 3.8. CT A/P with contrast with intra and extrahepatic biliary ductal dilation and focal density in CBD suspicious for choledocholithiasis. She was treated with antibiotics.  Underwent ERCP 8/15 with prior biliary sphincterotomy appearing open, marked dilated CBD and CHD along with dilated intrahepatic biliary radicles, biliary sphincterotomy extended by 5 mm, large amount of debris/sludge removed from bile duct. Urso 300 mg twice daily with plans to continue this long-term.  She had clinical improvement after ERCP, diet was advanced, and LFTs improving (AST 25, ALT 28, alk phos 203, t bili 1.3 day of discharge).    Reviewed lab reports scanned into the epic dated 06/07/2021 with LFTs and T bili all normal.   Today: Presents with her daughter who helps provide history.   Has been doing well overall since her hospital discharge.  Has problems with belching.  Typical reflux symptoms are well controlled on omeprazole 20 mg daily.  No nausea or vomiting. Occasional epigastric discomfort after eating, but nothing routine. Sharp, comes and goes within few seconds to minutes. No specific food triggers. Doesn't want to increase omeprazole.   Avoids spicy foods.  Drinks Dr. Malachi Bonds every other day.  No NSAIDs.   Ran out of ursodiol about 3 months ago.  Having problems with constipation. Skips up to 3 days between Bms. Taking MOM 1/2 to 1 cup as needed.  Sometimes bowels are hard and she has to strain at times. No brbpr or melena. Associated mid to lower abdominal pain that improves once her bowels move.   Reports her weight has been fairly stable recently.  Fluctuates up and down within a few pounds.  Past Medical History:  Diagnosis Date   Acid reflux    Anxiety    CKD (chronic kidney disease)    COPD (chronic obstructive pulmonary disease) ([place])    Depressed    Diabetes mellitus    Glaucoma    Hypercholesteremia    Hypertension    Legally blind in right eye, as defined in [place]    Panic attacks    Type 2 diabetes mellitus ([place])     Past Surgical History:  Procedure Laterality Date   APPENDECTOMY     BALLOON DILATION  07/16/2017   Procedure: BALLOON DILATION;  Surgeon: Danie Binder, MD;  [facility];  Service: Endoscopy;;   cataracts     CHOLECYSTECTOMY     ERCP N/A 07/16/2017   biliary papillary stenosis, filling defect c/w a stone and sludge, entire biliary tree dilated. completed biliary sphincterotomy and balloon extraction, stone removal   ERCP N/A 04/10/2021   Surgeon: Rogene Houston, MD;  prior biliary sphincterotomy appearing open, marked dilated CBD and CHD along with dilated intrahepatic biliary radicles, biliary sphincterotomy extended by 5 mm,  large amount of debris/sludge removed from bile duct.   ESOPHAGOGASTRODUODENOSCOPY  2006   Dr. Gala Romney: normal esophagus s/p empiric dilataton, small hiatal hernia   HIP SURGERY     rod in leg as well   INTRAMEDULLARY (IM) NAIL INTERTROCHANTERIC Left 06/13/2020   Procedure: INTRAMEDULLARY (IM) NAIL INTERTROCHANTRIC;  Surgeon: [name], MD;  Location: [facility];  Service: Orthopedics;  Laterality: Left;   MASS EXCISION Left 09/26/2017   Procedure:  EXCISION MUCOID CYST WITH DISTAL INTERPHALANGEAL JOINT ARTHROTOMY OF LEFT MIDDLE FINGER;  Surgeon: Leanora Cover, MD;  Location: [facility];  Service: Orthopedics;  Laterality: Left;   SPHINCTEROTOMY  07/16/2017   Procedure: SPHINCTEROTOMY;  Surgeon: Danie Binder, MD;  [facility];  Service: Endoscopy;;   SPHINCTEROTOMY N/A 04/10/2021   Procedure: Joan Mayans;  Surgeon: Rogene Houston, MD;  [facility];  Service: Endoscopy;  Laterality: N/A;   TUBAL LIGATION      Current Outpatient Medications  Medication Sig Dispense Refill   albuterol (VENTOLIN HFA) 108 (90 Base) MCG/ACT inhaler Inhale 2 puffs into the lungs every 4 (four) hours as needed for wheezing or shortness of breath.     amLODipine (NORVASC) 5 MG tablet Take 5 mg by mouth daily.     furosemide (LASIX) 20 MG tablet Take 20 mg daily as needed by mouth for fluid.     gabapentin (NEURONTIN) 100 MG capsule TAKE 1 CAPSULE BY MOUTH THREE TIMES A DAY (Patient taking differently: Take 100 mg by mouth 3 (three) times daily.) 60 capsule 5   glipiZIDE (GLUCOTROL) 5 MG tablet Take 5 mg by mouth 2 (two) times daily.     lidocaine (XYLOCAINE) 2 % solution Use as directed 5 mLs in the mouth or throat as needed for mouth pain. 100 mL 0   Magnesium Hydroxide (MILK OF MAGNESIA PO) Take 15-30 mLs by mouth as needed.     omeprazole (PRILOSEC) 20 MG capsule Take 20 mg by mouth daily.     ondansetron (ZOFRAN) 4 MG tablet Take 4 mg by mouth every 8 (eight) hours as needed for nausea/vomiting.     pravastatin (PRAVACHOL) 40 MG tablet Take 40 mg by mouth daily.     promethazine-dextromethorphan (PROMETHAZINE-DM) 6.25-15 MG/5ML syrup Take 5 mLs by mouth 4 (four) times daily as needed. 100 mL 0   traMADol (ULTRAM) 50 MG tablet Take 1 tablet (50 mg total) by mouth every 12 (twelve) hours as needed for severe pain. 15 tablet 0   TRESIBA FLEXTOUCH 100 UNIT/ML FlexTouch Pen Inject 10 Units into the skin as needed.     XARELTO  15 MG TABS tablet Take 1 tablet (15 mg total) by mouth daily. Restart on 04/13/2021 42 tablet    ursodiol (ACTIGALL) 300 MG capsule Take 1 capsule (300 mg total) by mouth 2 (two) times daily. 180 capsule 3   No current facility-administered medications for this visit.    Allergies as of 09/04/2021 - Review Complete 09/04/2021  Allergen Reaction Noted   Hydromorphone hcl Other (See Comments)     Family History  Problem Relation Age of Onset   Blindness Father    Bone cancer Father    Brain cancer Sister    Alzheimer's disease Brother    Colon cancer Neg Hx     Social History   Socioeconomic History   Marital status: Divorced    Spouse name: Not on file   Number of children: Not on file   Years of education: Not on  file   Highest education level: Not on file  Occupational History   Not on file  Tobacco Use   Smoking status: Never   Smokeless tobacco: Never  Vaping Use   Vaping Use: Never used  Substance and Sexual Activity   Alcohol use: No   Drug use: No   Sexual activity: Never  Other Topics Concern   Not on file  Social History Narrative   Not on file   Social Determinants of Health   Financial Resource Strain: Not on file  Food Insecurity: Not on file  Transportation Needs: Not on file  Physical Activity: Not on file  Stress: Not on file  Social Connections: Not on file    Review of Systems: Gen: Denies fever, chills, anorexia. Denies fatigue, weakness, weight loss.  CV: Denies chest pain, palpitations, syncope, peripheral edema, and claudication. Resp: Denies dyspnea at rest, cough, wheezing, coughing up blood, and pleurisy. GI: Denies vomiting blood, jaundice, and fecal incontinence.   Denies dysphagia or odynophagia. Derm: Denies rash, itching, dry skin Psych: Denies depression, anxiety, memory loss, confusion. No homicidal or suicidal ideation.  Heme: Denies bruising, bleeding, and enlarged lymph nodes.  Physical Exam: BP 125/89    Pulse 87     Temp (!) 96.6 F (35.9 C) (Temporal)    Ht '5\' 3"'  (1.6 m)    Wt 173 lb (78.5 kg) Comment: stated by daughter, pt in w/c   BMI 30.65 kg/m  General:   Alert and oriented. No distress noted. Pleasant and cooperative.  Head:  Normocephalic and atraumatic. Eyes:  Conjuctiva clear without scleral icterus. Mouth:  Oral mucosa pink and moist. Good dentition. No lesions. Heart:  S1, S2 present without murmurs appreciated. Lungs:  Clear to auscultation bilaterally. No wheezes, rales, or rhonchi. No distress.  Abdomen:  +BS, soft, non-tender and non-distended. No rebound or guarding. No HSM or masses noted. Msk:  Symmetrical without gross deformities. Normal posture. Extremities:  Without edema. Neurologic:  Alert and  oriented x4 Psych:  Alert and cooperative. Normal mood and affect.    Assessment: 88 year old female with history of COPD, CKD, atrial fibrillation, T2DM, HTN, HLD, anxiety, choledocholithiasis s/p ERCP in 2018 showing biliary papillary stenosis, stone and sludge in CBD s/p sphincterotomy and balloon extraction, presenting today for hospital follow-up of cholangitis in the setting of CBD debris. She was admitted in August 2022 with acute cholangitis in the setting of CBD debris treated with antibiotics and underwent ERCP with prior biliary sphincterotomy appearing open, markedly dilated CBD and CHD along with dilated intrahepatic biliary radicles, biliary cingulotomy extended by 5 mm, large amount of debris/sludge removed from bile duct.  She is started on ursodiol 300 mg twice daily with plans to continue this long-term.  LFTs were elevated on admission: AST 97, ALT 63, alk phos 349, t bilirubin 3.8. Day of discharge, AST 25, ALT 28, alk phos 203, t bili 1.3 day.  Labs completed 06/07/2021 with LFTs and T bili all normal.   Clinically, she is doing fairly well. Reports non-specific intermittent postprandial epigastric abdominal pain that last for a few seconds to a couple minutes then  spontaneously resolves without associated nausea or vomiting.  No fever or chills.  Chronic reflux symptoms are well controlled on omeprazole 20 mg daily though she does report frequent belching.  Denies NSAIDs.  Unfortunately, she ran out of ursodiol about 3 months ago.  Her abdominal exam is benign today.  Mild intermittent epigastric pain is nonspecific.  Could be related to  GERD, unidentified dietary triggers, gastritis, and cannot rule out biliary etiology.  Discussed trial of increasing omeprazole to 40 mg daily, but patient prefers to hold off on this as her symptoms are very mild.  We will update LFTs at this time to ensure they have remained normal and also resume ursodiol.  Advised patient to let us know of any worsening symptoms.  Constipation: Possibly secondary to tramadol. Skips up to 3 days between bowel movements, currently using milk of magnesia as needed.  Associated lower abdominal discomfort that improves once her bowels move. No alarm symptoms. Recommended Metamucil and MiraLAX daily, decrease frequency of MiraLAX if stools become loose.   Plan: HFP. Resume ursodiol 300 mg twice daily. Continue omeprazole 20 mg daily. Reinforced GERD diet/lifestyle.  Separate written instructions provided. Eat slowly, avoid drinking through a straw, hard candies, and chewing gum to help with belching. Monitor for any worsening upper abdominal pain, belching, or breakthrough reflux symptoms and let us know if this occurs. Start Metamucil daily. Start MiraLAX 1 capful (17 g) daily in 8 ounces of water or juice.  Decrease frequency if stools become loose. Requested patient let us know if any ongoing issues with constipation. Follow-up in 6 months or sooner if needed.   Aliene Altes, PA-C [facility] 09/04/2021

## 2021-09-04 ENCOUNTER — Ambulatory Visit (INDEPENDENT_AMBULATORY_CARE_PROVIDER_SITE_OTHER): Payer: Medicare Other | Admitting: Gastroenterology

## 2021-09-04 ENCOUNTER — Encounter: Payer: Self-pay | Admitting: Gastroenterology

## 2021-09-04 ENCOUNTER — Other Ambulatory Visit: Payer: Self-pay

## 2021-09-04 VITALS — BP 125/89 | HR 87 | Temp 96.6°F | Ht 63.0 in | Wt 173.0 lb

## 2021-09-04 DIAGNOSIS — K838 Other specified diseases of biliary tract: Secondary | ICD-10-CM

## 2021-09-04 DIAGNOSIS — R1013 Epigastric pain: Secondary | ICD-10-CM

## 2021-09-04 DIAGNOSIS — K5900 Constipation, unspecified: Secondary | ICD-10-CM | POA: Diagnosis not present

## 2021-09-04 DIAGNOSIS — R142 Eructation: Secondary | ICD-10-CM | POA: Diagnosis not present

## 2021-09-04 MED ORDER — URSODIOL 300 MG PO CAPS: 300.0000 mg | ORAL_CAPSULE | Freq: Two times a day (BID) | ORAL | 3 refills | Status: DC

## 2021-09-04 NOTE — Patient Instructions (Addendum)
Please have blood work completed at Dr. Juel Burrow office.  Please have results faxed to our office.  Resume ursodiol 300 mg twice daily.  For constipation: Start Metamucil daily.  Follow instructions on container. May also use MiraLAX 1 capful (17 g) daily in 8 ounces of water or juice.  Decrease frequency of MiraLAX if you begin having loose stools. If the above recommendations do not manage her constipation well, please let me know.  Continue taking omeprazole 20 mg daily for GERD.  Follow a GERD diet:  Avoid fried, fatty, greasy, spicy, citrus foods. Avoid caffeine and carbonated beverages. Avoid chocolate. Try eating 4-6 small meals a day rather than 3 large meals. Do not eat within 3 hours of laying down. Prop head of bed up on wood or bricks to create a 6 inch incline.  Also, eat slowly, avoid drinking through a straw, avoid hard candies, and avoid chewing gum due to your belching.   Let me know of any worsening upper abdominal pain, increased belching, or if you would like to try increasing omeprazole.   It was good to see you today!  We will plan to see back in about 6 months or sooner if needed.  Aliene Altes, PA-C [facility]

## 2021-09-12 DIAGNOSIS — K838 Other specified diseases of biliary tract: Secondary | ICD-10-CM | POA: Diagnosis not present

## 2021-09-12 DIAGNOSIS — E1165 Type 2 diabetes mellitus with hyperglycemia: Secondary | ICD-10-CM | POA: Diagnosis not present

## 2021-09-12 DIAGNOSIS — E785 Hyperlipidemia, unspecified: Secondary | ICD-10-CM | POA: Diagnosis not present

## 2021-09-12 DIAGNOSIS — N189 Chronic kidney disease, unspecified: Secondary | ICD-10-CM | POA: Diagnosis not present

## 2021-09-12 DIAGNOSIS — E211 Secondary hyperparathyroidism, not elsewhere classified: Secondary | ICD-10-CM | POA: Diagnosis not present

## 2021-09-12 DIAGNOSIS — I129 Hypertensive chronic kidney disease with stage 1 through stage 4 chronic kidney disease, or unspecified chronic kidney disease: Secondary | ICD-10-CM | POA: Diagnosis not present

## 2021-09-12 DIAGNOSIS — R809 Proteinuria, unspecified: Secondary | ICD-10-CM | POA: Diagnosis not present

## 2021-09-12 DIAGNOSIS — R1013 Epigastric pain: Secondary | ICD-10-CM | POA: Diagnosis not present

## 2021-09-15 ENCOUNTER — Telehealth: Payer: Self-pay | Admitting: Gastroenterology

## 2021-09-15 DIAGNOSIS — F419 Anxiety disorder, unspecified: Secondary | ICD-10-CM | POA: Diagnosis not present

## 2021-09-15 DIAGNOSIS — K219 Gastro-esophageal reflux disease without esophagitis: Secondary | ICD-10-CM | POA: Diagnosis not present

## 2021-09-15 DIAGNOSIS — E559 Vitamin D deficiency, unspecified: Secondary | ICD-10-CM | POA: Diagnosis not present

## 2021-09-15 DIAGNOSIS — E1140 Type 2 diabetes mellitus with diabetic neuropathy, unspecified: Secondary | ICD-10-CM | POA: Diagnosis not present

## 2021-09-15 DIAGNOSIS — K8030 Calculus of bile duct with cholangitis, unspecified, without obstruction: Secondary | ICD-10-CM | POA: Diagnosis not present

## 2021-09-15 DIAGNOSIS — G894 Chronic pain syndrome: Secondary | ICD-10-CM | POA: Diagnosis not present

## 2021-09-15 DIAGNOSIS — E211 Secondary hyperparathyroidism, not elsewhere classified: Secondary | ICD-10-CM | POA: Diagnosis not present

## 2021-09-15 DIAGNOSIS — N184 Chronic kidney disease, stage 4 (severe): Secondary | ICD-10-CM | POA: Diagnosis not present

## 2021-09-15 DIAGNOSIS — E785 Hyperlipidemia, unspecified: Secondary | ICD-10-CM | POA: Diagnosis not present

## 2021-09-15 DIAGNOSIS — E1165 Type 2 diabetes mellitus with hyperglycemia: Secondary | ICD-10-CM | POA: Diagnosis not present

## 2021-09-15 DIAGNOSIS — J449 Chronic obstructive pulmonary disease, unspecified: Secondary | ICD-10-CM | POA: Diagnosis not present

## 2021-09-15 DIAGNOSIS — I480 Paroxysmal atrial fibrillation: Secondary | ICD-10-CM | POA: Diagnosis not present

## 2021-09-15 NOTE — Telephone Encounter (Signed)
Spoke to pt, daughter [name] ([place]). Informed her of recommendations. She voiced understanding.

## 2021-09-15 NOTE — Telephone Encounter (Signed)
Received labs dated 09/12/2021 from Dr. Juel Burrow office.  CBC: WBC 7.4, hemoglobin 12.7, hematocrit 36.9, MCV 86, MCH 29.7, MCHC 34.4, platelets 304. CMP: Glucose 99, BUN 27, creatinine 1.67 (H), sodium 139, potassium 4.9, chloride 98, calcium 9.0, total protein 6.2, albumin 3.6, total bilirubin 0.6, alk phos 122, AST 11, ALT 5. Iron panel: TIBC 242 (L), iron 67, iron saturation 28%, ferritin 119. Hemoglobin A1c 8.4 (H) Vitamin D, 25-hydroxy: 15.7 (L)  Courtney: Please let patient know I have received and reviewed her labs she completed with Dr. Juel Burrow office.  Her liver enzymes are within normal limits.  No additional recommendations at this time.  We will follow-up as planned.

## 2021-09-26 DIAGNOSIS — L859 Epidermal thickening, unspecified: Secondary | ICD-10-CM | POA: Diagnosis not present

## 2021-09-26 DIAGNOSIS — E1142 Type 2 diabetes mellitus with diabetic polyneuropathy: Secondary | ICD-10-CM | POA: Diagnosis not present

## 2021-09-26 DIAGNOSIS — M79671 Pain in right foot: Secondary | ICD-10-CM | POA: Diagnosis not present

## 2021-09-26 DIAGNOSIS — L603 Nail dystrophy: Secondary | ICD-10-CM | POA: Diagnosis not present

## 2021-09-28 ENCOUNTER — Other Ambulatory Visit: Payer: Self-pay | Admitting: Gastroenterology

## 2021-09-28 ENCOUNTER — Telehealth: Payer: Self-pay | Admitting: *Deleted

## 2021-09-28 DIAGNOSIS — K838 Other specified diseases of biliary tract: Secondary | ICD-10-CM

## 2021-09-28 MED ORDER — URSODIOL 300 MG PO CAPS: 300.0000 mg | ORAL_CAPSULE | Freq: Two times a day (BID) | ORAL | 3 refills | Status: DC

## 2021-09-28 NOTE — Telephone Encounter (Signed)
Rx sent 

## 2021-09-28 NOTE — Telephone Encounter (Signed)
Noted  

## 2021-09-28 NOTE — Telephone Encounter (Signed)
[facility] in [place]

## 2021-09-28 NOTE — Telephone Encounter (Signed)
Spoke to Faith from [employer], she needs a new prescription of Actigall 300mg   sent to [facility], because pt has changed pharmacy's.

## 2021-09-28 NOTE — Telephone Encounter (Signed)
Please add the new pharmacy to her chart, and I will send the Rx.

## 2021-10-02 ENCOUNTER — Telehealth: Payer: Self-pay | Admitting: Orthopedic Surgery

## 2021-10-02 NOTE — Telephone Encounter (Signed)
Office note is all we have [place] to send.

## 2021-10-02 NOTE — Telephone Encounter (Signed)
Authorization/Release form re-sent/received in fax for this patient requesting specifically "compression stocking information". We have sent the last visit note with Dr Aline Brochure. Please advise. (Release copy in chart)

## 2021-10-09 DIAGNOSIS — M25532 Pain in left wrist: Secondary | ICD-10-CM | POA: Diagnosis not present

## 2021-10-19 DIAGNOSIS — E559 Vitamin D deficiency, unspecified: Secondary | ICD-10-CM | POA: Diagnosis not present

## 2021-10-19 DIAGNOSIS — N189 Chronic kidney disease, unspecified: Secondary | ICD-10-CM | POA: Diagnosis not present

## 2021-10-19 DIAGNOSIS — I129 Hypertensive chronic kidney disease with stage 1 through stage 4 chronic kidney disease, or unspecified chronic kidney disease: Secondary | ICD-10-CM | POA: Diagnosis not present

## 2021-10-19 DIAGNOSIS — E1122 Type 2 diabetes mellitus with diabetic chronic kidney disease: Secondary | ICD-10-CM | POA: Diagnosis not present

## 2021-10-19 DIAGNOSIS — E1129 Type 2 diabetes mellitus with other diabetic kidney complication: Secondary | ICD-10-CM | POA: Diagnosis not present

## 2021-10-19 DIAGNOSIS — I77810 Thoracic aortic ectasia: Secondary | ICD-10-CM | POA: Diagnosis not present

## 2021-10-19 DIAGNOSIS — E211 Secondary hyperparathyroidism, not elsewhere classified: Secondary | ICD-10-CM | POA: Diagnosis not present

## 2021-10-19 DIAGNOSIS — R809 Proteinuria, unspecified: Secondary | ICD-10-CM | POA: Diagnosis not present

## 2021-12-05 DIAGNOSIS — L859 Epidermal thickening, unspecified: Secondary | ICD-10-CM | POA: Diagnosis not present

## 2021-12-05 DIAGNOSIS — L603 Nail dystrophy: Secondary | ICD-10-CM | POA: Diagnosis not present

## 2021-12-05 DIAGNOSIS — E1142 Type 2 diabetes mellitus with diabetic polyneuropathy: Secondary | ICD-10-CM | POA: Diagnosis not present

## 2021-12-08 DIAGNOSIS — N189 Chronic kidney disease, unspecified: Secondary | ICD-10-CM | POA: Diagnosis not present

## 2021-12-08 DIAGNOSIS — E785 Hyperlipidemia, unspecified: Secondary | ICD-10-CM | POA: Diagnosis not present

## 2021-12-08 DIAGNOSIS — E1165 Type 2 diabetes mellitus with hyperglycemia: Secondary | ICD-10-CM | POA: Diagnosis not present

## 2021-12-14 DIAGNOSIS — F419 Anxiety disorder, unspecified: Secondary | ICD-10-CM | POA: Diagnosis not present

## 2021-12-14 DIAGNOSIS — R600 Localized edema: Secondary | ICD-10-CM | POA: Diagnosis not present

## 2021-12-14 DIAGNOSIS — I480 Paroxysmal atrial fibrillation: Secondary | ICD-10-CM | POA: Diagnosis not present

## 2021-12-14 DIAGNOSIS — G894 Chronic pain syndrome: Secondary | ICD-10-CM | POA: Diagnosis not present

## 2021-12-14 DIAGNOSIS — E782 Mixed hyperlipidemia: Secondary | ICD-10-CM | POA: Diagnosis not present

## 2021-12-14 DIAGNOSIS — K219 Gastro-esophageal reflux disease without esophagitis: Secondary | ICD-10-CM | POA: Diagnosis not present

## 2021-12-14 DIAGNOSIS — E1140 Type 2 diabetes mellitus with diabetic neuropathy, unspecified: Secondary | ICD-10-CM | POA: Diagnosis not present

## 2021-12-14 DIAGNOSIS — E1142 Type 2 diabetes mellitus with diabetic polyneuropathy: Secondary | ICD-10-CM | POA: Diagnosis not present

## 2021-12-14 DIAGNOSIS — I10 Essential (primary) hypertension: Secondary | ICD-10-CM | POA: Diagnosis not present

## 2021-12-14 DIAGNOSIS — K8030 Calculus of bile duct with cholangitis, unspecified, without obstruction: Secondary | ICD-10-CM | POA: Diagnosis not present

## 2021-12-14 DIAGNOSIS — N184 Chronic kidney disease, stage 4 (severe): Secondary | ICD-10-CM | POA: Diagnosis not present

## 2021-12-14 DIAGNOSIS — J449 Chronic obstructive pulmonary disease, unspecified: Secondary | ICD-10-CM | POA: Diagnosis not present

## 2021-12-26 DIAGNOSIS — S81802A Unspecified open wound, left lower leg, initial encounter: Secondary | ICD-10-CM | POA: Diagnosis not present

## 2021-12-26 DIAGNOSIS — Z634 Disappearance and death of family member: Secondary | ICD-10-CM | POA: Diagnosis not present

## 2022-01-04 ENCOUNTER — Encounter: Payer: Self-pay | Admitting: Cardiology

## 2022-01-04 ENCOUNTER — Ambulatory Visit (INDEPENDENT_AMBULATORY_CARE_PROVIDER_SITE_OTHER): Payer: Medicare Other | Admitting: Cardiology

## 2022-01-04 VITALS — BP 100/62 | HR 72 | Ht 63.0 in | Wt 166.0 lb

## 2022-01-04 DIAGNOSIS — R600 Localized edema: Secondary | ICD-10-CM

## 2022-01-04 DIAGNOSIS — M7989 Other specified soft tissue disorders: Secondary | ICD-10-CM | POA: Diagnosis not present

## 2022-01-04 DIAGNOSIS — I4891 Unspecified atrial fibrillation: Secondary | ICD-10-CM

## 2022-01-04 DIAGNOSIS — D6869 Other thrombophilia: Secondary | ICD-10-CM

## 2022-01-04 NOTE — Patient Instructions (Signed)
Medication Instructions:  ?Your physician recommends that you continue on your current medications as directed. Please refer to the Current Medication list given to you today.  ? ?Labwork: ?none ? ?Testing/Procedures: ?none ? ?Follow-Up: ?Your physician recommends that you schedule a follow-up appointment in: 6 months ? ?Any Other Special Instructions Will Be Listed Below (If Applicable). ? ?You have been referred to vein and vascular. ? ?If you need a refill on your cardiac medications before your next appointment, please call your pharmacy. ? ?

## 2022-01-04 NOTE — Progress Notes (Signed)
? ? ? ?Clinical Summary ?Ms. Coyt is a 88 y.o.female seen today for follow up of the following medical probems ? ?1. Afib ?- noted during 06/2017 admission after ERCP ?- did not require av nodal agents, was to start anticoag but does appear was never started.  ?  ?-isolated episode of palpitations ?- doing well on xarelto without bleeding.  ?  ?2. Possible descending aortic dissection ?During 06/2017 admission suggestive findings on MRI but not present by CT, could not use contrast due to renal dysfunction ?- not further pursued ?- at her advancd age and comorbidities would not repeat any imaging to reassess ?-Jan 2023 echo aortic root 40 mm ? ?3. LE edema ?- Jan 2023 echo LVEF 60-65%, no WMAs, indet diastolic fxn. Normal RV.  ?- progessing LLE edema x 3 weeks.  ?Past Medical History:  ?Diagnosis Date  ? Acid reflux   ? Anxiety   ? CKD (chronic kidney disease)   ? COPD (chronic obstructive pulmonary disease) ([place])   ? Depressed   ? Diabetes mellitus   ? Glaucoma   ? Hypercholesteremia   ? Hypertension   ? Legally blind in right eye, as defined in [place]   ? Panic attacks   ? Type 2 diabetes mellitus ([place])   ? ? ? ?Allergies  ?Allergen Reactions  ? Hydromorphone Hcl Other (See Comments)  ?  Patient goes out of right state of mind.   ? ? ? ?Current Outpatient Medications  ?Medication Sig Dispense Refill  ? albuterol (VENTOLIN HFA) 108 (90 Base) MCG/ACT inhaler Inhale 2 puffs into the lungs every 4 (four) hours as needed for wheezing or shortness of breath.    ? amLODipine (NORVASC) 5 MG tablet Take 5 mg by mouth daily.    ? furosemide (LASIX) 20 MG tablet Take 20 mg daily as needed by mouth for fluid.    ? gabapentin (NEURONTIN) 100 MG capsule TAKE 1 CAPSULE BY MOUTH THREE TIMES A DAY (Patient taking differently: Take 100 mg by mouth 3 (three) times daily.) 60 capsule 5  ? glipiZIDE (GLUCOTROL) 5 MG tablet Take 5 mg by mouth 2 (two) times daily.    ? lidocaine (XYLOCAINE) 2 % solution Use as directed 5 mLs in the  mouth or throat as needed for mouth pain. 100 mL 0  ? Magnesium Hydroxide (MILK OF MAGNESIA PO) Take 15-30 mLs by mouth as needed.    ? omeprazole (PRILOSEC) 20 MG capsule Take 20 mg by mouth daily.    ? ondansetron (ZOFRAN) 4 MG tablet Take 4 mg by mouth every 8 (eight) hours as needed for nausea/vomiting.    ? pravastatin (PRAVACHOL) 40 MG tablet Take 40 mg by mouth daily.    ? promethazine-dextromethorphan (PROMETHAZINE-DM) 6.25-15 MG/5ML syrup Take 5 mLs by mouth 4 (four) times daily as needed. 100 mL 0  ? traMADol (ULTRAM) 50 MG tablet Take 1 tablet (50 mg total) by mouth every 12 (twelve) hours as needed for severe pain. 15 tablet 0  ? TRESIBA FLEXTOUCH 100 UNIT/ML FlexTouch Pen Inject 10 Units into the skin as needed.    ? ursodiol (ACTIGALL) 300 MG capsule Take 1 capsule (300 mg total) by mouth 2 (two) times daily. 180 capsule 3  ? XARELTO 15 MG TABS tablet Take 1 tablet (15 mg total) by mouth daily. Restart on 04/13/2021 42 tablet   ? ?No current facility-administered medications for this visit.  ? ? ? ?Past Surgical History:  ?Procedure Laterality Date  ? APPENDECTOMY    ?  BALLOON DILATION  07/16/2017  ? Procedure: BALLOON DILATION;  Surgeon: Danie Binder, MD;  [facility];  Service: Endoscopy;;  ? cataracts    ? CHOLECYSTECTOMY    ? ERCP N/A 07/16/2017  ? biliary papillary stenosis, filling defect c/w a stone and sludge, entire biliary tree dilated. completed biliary sphincterotomy and balloon extraction, stone removal  ? ERCP N/A 04/10/2021  ? Surgeon: Rogene Houston, MD;  prior biliary sphincterotomy appearing open, marked dilated CBD and CHD along with dilated intrahepatic biliary radicles, biliary sphincterotomy extended by 5 mm, large amount of debris/sludge removed from bile duct.  ? ESOPHAGOGASTRODUODENOSCOPY  2006  ? Dr. Gala Romney: normal esophagus s/p empiric dilataton, small hiatal hernia  ? HIP SURGERY    ? rod in leg as well  ? INTRAMEDULLARY (IM) NAIL INTERTROCHANTERIC Left  06/13/2020  ? Procedure: INTRAMEDULLARY (IM) NAIL INTERTROCHANTRIC;  Surgeon: [name], MD;  Location: [facility];  Service: Orthopedics;  Laterality: Left;  ? MASS EXCISION Left 09/26/2017  ? Procedure: EXCISION MUCOID CYST WITH DISTAL INTERPHALANGEAL JOINT ARTHROTOMY OF LEFT MIDDLE FINGER;  Surgeon: Leanora Cover, MD;  Location: [facility];  Service: Orthopedics;  Laterality: Left;  ? SPHINCTEROTOMY  07/16/2017  ? Procedure: SPHINCTEROTOMY;  Surgeon: Danie Binder, MD;  [facility];  Service: Endoscopy;;  ? SPHINCTEROTOMY N/A 04/10/2021  ? Procedure: SPHINCTEROTOMY;  Surgeon: Rogene Houston, MD;  [facility];  Service: Endoscopy;  Laterality: N/A;  ? TUBAL LIGATION    ? ? ? ?Allergies  ?Allergen Reactions  ? Hydromorphone Hcl Other (See Comments)  ?  Patient goes out of right state of mind.   ? ? ? ? ?Family History  ?Problem Relation Age of Onset  ? Blindness Father   ? Bone cancer Father   ? Brain cancer Sister   ? Alzheimer's disease Brother   ? Colon cancer Neg Hx   ? ? ? ?Social History ?Ms. Heffern reports that she has never smoked. She has never used smokeless tobacco. ?Ms. Aboud reports no history of alcohol use. ? ? ?Review of Systems ?CONSTITUTIONAL: No weight loss, fever, chills, weakness or fatigue.  ?HEENT: Eyes: No visual loss, blurred vision, double vision or yellow sclerae.No hearing loss, sneezing, congestion, runny nose or sore throat.  ?SKIN: No rash or itching.  ?CARDIOVASCULAR: per hpi ?RESPIRATORY: No shortness of breath, cough or sputum.  ?GASTROINTESTINAL: No anorexia, nausea, vomiting or diarrhea. No abdominal pain or blood.  ?GENITOURINARY: No burning on urination, no polyuria ?NEUROLOGICAL: No headache, dizziness, syncope, paralysis, ataxia, numbness or tingling in the extremities. No change in bowel or bladder control.  ?MUSCULOSKELETAL: No muscle, back pain, joint pain or stiffness.  ?LYMPHATICS: No enlarged nodes. No history of splenectomy.   ?PSYCHIATRIC: No history of depression or anxiety.  ?ENDOCRINOLOGIC: No reports of sweating, cold or heat intolerance. No polyuria or polydipsia.  ?. ? ? ?Physical Examination ?Today's Vitals  ? 01/04/22 1402  ?BP: 100/62  ?Pulse: 72  ?SpO2: 95%  ?Weight: 166 lb (75.3 kg)  ?Height: '5\' 3"'$  (1.6 m)  ? ?Body mass index is 29.41 kg/m?. ? ?Gen: resting comfortably, no acute distress ?HEENT: no scleral icterus, pupils equal round and reactive, no palptable cervical adenopathy,  ?CV: irreg, no m/r/g, n ojvd ?Resp: Clear to auscultation bilaterally ?GI: abdomen is soft, non-tender, non-distended, normal bowel sounds, no hepatosplenomegaly ?MSK: extremities are warm, 1+ LLE edema ?Skin: warm, no rash ?Neuro:  no focal deficits ?Psych: appropriate affect ? ? ?Diagnostic Studies ? ? ? ? ?  Assessment and Plan  ? ?1. Afib/acquired thrombophilia ?- she does not require av nodal agents, afib with slow VR previously. Has been self rate controlled ?-doing well on xarelto, continue ?  ?  ?2. Left leg edema ?- marked left leg edema x 3 weeks per family ?- she is on xarelto, DVT would be unlikely ?- refer to vein and vascular for evaluation.  ? ? ? ?Arnoldo Lenis, M.D. ?

## 2022-01-11 DIAGNOSIS — H1045 Other chronic allergic conjunctivitis: Secondary | ICD-10-CM | POA: Diagnosis not present

## 2022-01-11 DIAGNOSIS — Z961 Presence of intraocular lens: Secondary | ICD-10-CM | POA: Diagnosis not present

## 2022-01-11 DIAGNOSIS — E119 Type 2 diabetes mellitus without complications: Secondary | ICD-10-CM | POA: Diagnosis not present

## 2022-01-11 DIAGNOSIS — H04123 Dry eye syndrome of bilateral lacrimal glands: Secondary | ICD-10-CM | POA: Diagnosis not present

## 2022-01-11 DIAGNOSIS — H353131 Nonexudative age-related macular degeneration, bilateral, early dry stage: Secondary | ICD-10-CM | POA: Diagnosis not present

## 2022-01-11 DIAGNOSIS — H401133 Primary open-angle glaucoma, bilateral, severe stage: Secondary | ICD-10-CM | POA: Diagnosis not present

## 2022-01-27 DIAGNOSIS — E1129 Type 2 diabetes mellitus with other diabetic kidney complication: Secondary | ICD-10-CM | POA: Diagnosis not present

## 2022-01-27 DIAGNOSIS — E1122 Type 2 diabetes mellitus with diabetic chronic kidney disease: Secondary | ICD-10-CM | POA: Diagnosis not present

## 2022-01-27 DIAGNOSIS — Z8679 Personal history of other diseases of the circulatory system: Secondary | ICD-10-CM | POA: Diagnosis not present

## 2022-01-27 DIAGNOSIS — R809 Proteinuria, unspecified: Secondary | ICD-10-CM | POA: Diagnosis not present

## 2022-01-27 DIAGNOSIS — I9589 Other hypotension: Secondary | ICD-10-CM | POA: Diagnosis not present

## 2022-01-27 DIAGNOSIS — E211 Secondary hyperparathyroidism, not elsewhere classified: Secondary | ICD-10-CM | POA: Diagnosis not present

## 2022-01-27 DIAGNOSIS — N189 Chronic kidney disease, unspecified: Secondary | ICD-10-CM | POA: Diagnosis not present

## 2022-02-13 DIAGNOSIS — L859 Epidermal thickening, unspecified: Secondary | ICD-10-CM | POA: Diagnosis not present

## 2022-02-13 DIAGNOSIS — L603 Nail dystrophy: Secondary | ICD-10-CM | POA: Diagnosis not present

## 2022-02-13 DIAGNOSIS — E1142 Type 2 diabetes mellitus with diabetic polyneuropathy: Secondary | ICD-10-CM | POA: Diagnosis not present

## 2022-02-18 ENCOUNTER — Other Ambulatory Visit: Payer: Self-pay

## 2022-02-18 DIAGNOSIS — M7989 Other specified soft tissue disorders: Secondary | ICD-10-CM

## 2022-03-06 NOTE — Progress Notes (Unsigned)
Referring Provider: Celene Squibb, MD Primary Care Physician:  Celene Squibb, MD Primary GI Physician: Dr. Gala Romney  No chief complaint on file.   HPI:   [name] is a 88 y.o. female with history of COPD, CKD, atrial fibrillation, diabetes mellitus type 2, hypertension, hyperlipidemia, anxiety, choledocholithiasis s/p ERCP in 2018 showing biliary papillary stenosis, stone and sludge in the common bile duct s/p sphincterotomy and balloon extraction, admitted in August 2022 with acute cholangitis in the setting of recurrent choledocholithiasis/CBD debris s/p ERCP with extension of biliary sphincterotomy by 31m and recommendations to start urdosiol BID long term, presenting today for routine follow-up.    Last seen in our office 09/04/2021.  Reported she had been doing well overall since her hospital discharge.  She is having some problems with belching, but typical reflux symptoms well controlled on omeprazole 20 mg daily.  Denied nausea, vomiting.  Admits to occasional epigastric discomfort after eating, but nothing routine, resolving within a few seconds to minutes.  Denied NSAIDs.  She had been taking ursodiol, but ran out about 3 months prior.  She did note trouble with constipation using milk of magnesia as needed and skipping up to 3 days between bowel movements.  Associated mild lower abdominal pain that improves with a bowel movement.  Discussed trial of increasing omeprazole to 40 mg daily to help with the occasional epigastric pain and belching, but patient preferred to hold off on this as her symptoms were mild.  Plan to update LFTs and resume ursodiol.  For constipation, recommended starting Metamucil and MiraLAX daily.  Labs completed on 09/12/2021 with LFTs normal.  Today:    Past Medical History:  Diagnosis Date   Acid reflux    Anxiety    CKD (chronic kidney disease)    [place]    Depressed    Diabetes mellitus    Glaucoma     Hypercholesteremia    Hypertension    [place]   Panic attacks    [place]     Past Surgical History:  Procedure Laterality Date   APPENDECTOMY     BALLOON DILATION  07/16/2017   Procedure: BALLOON DILATION;  Surgeon: FDanie Binder MD;  [facility];  Service: Endoscopy;;   cataracts     CHOLECYSTECTOMY     ERCP N/A 07/16/2017   biliary papillary stenosis, filling defect c/w a stone and sludge, entire biliary tree dilated. completed biliary sphincterotomy and balloon extraction, stone removal   ERCP N/A 04/10/2021   Surgeon: RRogene Houston MD;  prior biliary sphincterotomy appearing open, marked dilated CBD and CHD along with dilated intrahepatic biliary radicles, biliary sphincterotomy extended by 5 mm, large amount of debris/sludge removed from bile duct.   ESOPHAGOGASTRODUODENOSCOPY  2006   Dr. RGala Romney normal esophagus s/p empiric dilataton, small hiatal hernia   HIP SURGERY     rod in leg as well   INTRAMEDULLARY (IM) NAIL INTERTROCHANTERIC Left 06/13/2020   Procedure: INTRAMEDULLARY (IM) NAIL INTERTROCHANTRIC;  Surgeon: OParalee Cancel MD;  [place] [facility]  Service: Orthopedics;  Laterality: Left;   MASS EXCISION Left 09/26/2017   Procedure: EXCISION MUCOID CYST WITH DISTAL INTERPHALANGEAL JOINT ARTHROTOMY OF LEFT MIDDLE FINGER;  Surgeon: KLeanora Cover MD;  [place]  Service: Orthopedics;  Laterality: Left;   SPHINCTEROTOMY  07/16/2017   Procedure: SPHINCTEROTOMY;  Surgeon: FDanie Binder MD;  [facility];  Service: Endoscopy;;   SPHINCTEROTOMY N/A 04/10/2021   Procedure: SPHINCTEROTOMY;  Surgeon: Rogene Houston, MD;  [facility];  Service: Endoscopy;  Laterality: N/A;   TUBAL LIGATION      Current Outpatient Medications  Medication Sig Dispense Refill   albuterol (VENTOLIN HFA) 108 (90 Base) MCG/ACT inhaler Inhale 2 puffs into the lungs every 4 (four)  hours as needed for wheezing or shortness of breath.     amLODipine (NORVASC) 5 MG tablet Take 5 mg by mouth daily.     furosemide (LASIX) 20 MG tablet Take 20 mg daily as needed by mouth for fluid.     gabapentin (NEURONTIN) 100 MG capsule TAKE 1 CAPSULE BY MOUTH THREE TIMES A DAY (Patient taking differently: Take 100 mg by mouth 3 (three) times daily.) 60 capsule 5   Magnesium Hydroxide (MILK OF MAGNESIA PO) Take 15-30 mLs by mouth as needed.     omeprazole (PRILOSEC) 20 MG capsule Take 20 mg by mouth daily.     pravastatin (PRAVACHOL) 40 MG tablet Take 40 mg by mouth daily.     traMADol (ULTRAM) 50 MG tablet Take 1 tablet (50 mg total) by mouth every 12 (twelve) hours as needed for severe pain. 15 tablet 0   TRELEGY ELLIPTA 100-62.5-25 MCG/ACT AEPB Inhale 1 puff into the lungs daily.     TRESIBA FLEXTOUCH 100 UNIT/ML FlexTouch Pen Inject 10 Units into the skin as needed.     ursodiol (ACTIGALL) 300 MG capsule Take 1 capsule (300 mg total) by mouth 2 (two) times daily. 180 capsule 3   XARELTO 15 MG TABS tablet Take 1 tablet (15 mg total) by mouth daily. Restart on 04/13/2021 42 tablet    No current facility-administered medications for this visit.    Allergies as of 03/08/2022 - Review Complete 01/04/2022  Allergen Reaction Noted   Hydromorphone hcl Other (See Comments)     Family History  Problem Relation Age of Onset   Blindness Father    Bone cancer Father    Brain cancer Sister    Alzheimer's disease Brother    Colon cancer Neg Hx     Social History   Socioeconomic History   Marital status: Divorced    Spouse name: Not on file   Number of children: Not on file   Years of education: Not on file   Highest education level: Not on file  Occupational History   Not on file  Tobacco Use   Smoking status: Never   Smokeless tobacco: Never  Vaping Use   Vaping Use: Never used  Substance and Sexual Activity   Alcohol use: No   Drug use: No   Sexual activity: Never  Other  Topics Concern   Not on file  Social History Narrative   Not on file   Social Determinants of Health   Financial Resource Strain: Not on file  Food Insecurity: Not on file  Transportation Needs: Not on file  Physical Activity: Not on file  Stress: Not on file  Social Connections: Not on file    Review of Systems: Gen: Denies fever, chills, cold or flu like symptoms, pre-syncope, or syncope.  CV: Denies chest pain, palpitations. Resp: Denies dyspnea, cough.  GI: See HPI Heme: See HPI  Physical Exam: There were no vitals taken for this visit. General:   Alert and oriented. No distress noted. Pleasant and cooperative.  Head:  Normocephalic and atraumatic. Eyes:  Conjuctiva clear without scleral icterus. Heart:  S1, S2 present without murmurs appreciated. Lungs:  Clear to auscultation bilaterally. No wheezes, rales, or rhonchi. No distress.  Abdomen:  +BS, soft, non-tender and non-distended. No rebound or guarding. No HSM or masses noted. Msk:  Symmetrical without gross deformities. Normal posture. Extremities:  Without edema. Neurologic:  Alert and  oriented x4 Psych:  Normal mood and affect.    Assessment:     Plan:  ***   Aliene Altes, PA-C [facility] 03/08/2022

## 2022-03-08 ENCOUNTER — Ambulatory Visit (INDEPENDENT_AMBULATORY_CARE_PROVIDER_SITE_OTHER): Payer: Medicare Other | Admitting: Gastroenterology

## 2022-03-08 ENCOUNTER — Encounter: Payer: Self-pay | Admitting: Gastroenterology

## 2022-03-08 VITALS — BP 114/77 | HR 74 | Temp 97.7°F | Ht 62.0 in | Wt 167.0 lb

## 2022-03-08 DIAGNOSIS — R142 Eructation: Secondary | ICD-10-CM | POA: Diagnosis not present

## 2022-03-08 DIAGNOSIS — K838 Other specified diseases of biliary tract: Secondary | ICD-10-CM | POA: Diagnosis not present

## 2022-03-08 DIAGNOSIS — K219 Gastro-esophageal reflux disease without esophagitis: Secondary | ICD-10-CM | POA: Diagnosis not present

## 2022-03-08 DIAGNOSIS — K5900 Constipation, unspecified: Secondary | ICD-10-CM

## 2022-03-08 MED ORDER — OMEPRAZOLE 40 MG PO CPDR: 40.0000 mg | DELAYED_RELEASE_CAPSULE | Freq: Every day | ORAL | 1 refills | Status: AC

## 2022-03-08 NOTE — Patient Instructions (Signed)
Increase omeprazole to 40 mg daily 30 minutes before breakfast.  Follow a GERD diet/lifestyle:  Avoid fried, fatty, greasy, spicy, citrus foods. Avoid caffeine and carbonated beverages. Avoid chocolate. Try eating 4-6 small meals a day rather than 3 large meals. Do not eat within 3 hours of laying down. Prop head of bed up on wood or bricks to create a 6 inch incline.  Continue taking Colace daily for constipation.  Continue taking ursodiol 300 mg twice daily for history of stones and debris in your bile ducts.  We will plan to follow-up with you in 6 months.  Do not hesitate to call sooner if you have any questions or concerns prior to your next visit.  It was great to see you again today!  I am glad you are doing well overall!  Aliene Altes, PA-C [facility]

## 2022-03-12 DIAGNOSIS — E782 Mixed hyperlipidemia: Secondary | ICD-10-CM | POA: Diagnosis not present

## 2022-03-12 DIAGNOSIS — N390 Urinary tract infection, site not specified: Secondary | ICD-10-CM | POA: Diagnosis not present

## 2022-03-12 DIAGNOSIS — E1142 Type 2 diabetes mellitus with diabetic polyneuropathy: Secondary | ICD-10-CM | POA: Diagnosis not present

## 2022-03-13 DIAGNOSIS — N390 Urinary tract infection, site not specified: Secondary | ICD-10-CM | POA: Diagnosis not present

## 2022-03-19 DIAGNOSIS — I10 Essential (primary) hypertension: Secondary | ICD-10-CM | POA: Diagnosis not present

## 2022-03-19 DIAGNOSIS — J449 Chronic obstructive pulmonary disease, unspecified: Secondary | ICD-10-CM | POA: Diagnosis not present

## 2022-03-19 DIAGNOSIS — K219 Gastro-esophageal reflux disease without esophagitis: Secondary | ICD-10-CM | POA: Diagnosis not present

## 2022-03-19 DIAGNOSIS — R600 Localized edema: Secondary | ICD-10-CM | POA: Diagnosis not present

## 2022-03-19 DIAGNOSIS — G894 Chronic pain syndrome: Secondary | ICD-10-CM | POA: Diagnosis not present

## 2022-03-19 DIAGNOSIS — I480 Paroxysmal atrial fibrillation: Secondary | ICD-10-CM | POA: Diagnosis not present

## 2022-03-19 DIAGNOSIS — F419 Anxiety disorder, unspecified: Secondary | ICD-10-CM | POA: Diagnosis not present

## 2022-03-19 DIAGNOSIS — E782 Mixed hyperlipidemia: Secondary | ICD-10-CM | POA: Diagnosis not present

## 2022-03-19 DIAGNOSIS — E1142 Type 2 diabetes mellitus with diabetic polyneuropathy: Secondary | ICD-10-CM | POA: Diagnosis not present

## 2022-03-19 DIAGNOSIS — E1140 Type 2 diabetes mellitus with diabetic neuropathy, unspecified: Secondary | ICD-10-CM | POA: Diagnosis not present

## 2022-03-19 DIAGNOSIS — N184 Chronic kidney disease, stage 4 (severe): Secondary | ICD-10-CM | POA: Diagnosis not present

## 2022-03-19 DIAGNOSIS — K8030 Calculus of bile duct with cholangitis, unspecified, without obstruction: Secondary | ICD-10-CM | POA: Diagnosis not present

## 2022-03-20 ENCOUNTER — Encounter: Payer: Medicare Other | Admitting: Vascular Surgery

## 2022-03-20 ENCOUNTER — Encounter (HOSPITAL_COMMUNITY): Payer: Medicare Other

## 2022-03-20 DIAGNOSIS — E1142 Type 2 diabetes mellitus with diabetic polyneuropathy: Secondary | ICD-10-CM | POA: Diagnosis not present

## 2022-03-20 NOTE — Progress Notes (Unsigned)
[facility]           OF [place]  History and Physical   [name] is a 88 y.o. female who presents with LLE edema.  She has hx of afib and on Xarelto.  She was seen by Dr. Harl Bowie in May and was having left leg edema x 3 weeks.  Given she was on Xarelto, felt DVT was unlikely.  She is referred to [facility] for further evaluation.    The patient has *** history of DVT. Pt does *** history of varicose vein.   Pt does *** history of skin changes in lower legs.   There is *** family history of venous disorders.   The patient has *** used compression stockings in the past.    ***  The pt is on a statin for cholesterol management.  The pt is not on a daily aspirin.   Other AC:  Xarelto The pt is on CCB, diuretic for hypertension.   The pt is not diabetic.   Tobacco hx:  never   Past Medical History:  Diagnosis Date   Acid reflux    Anxiety    CKD (chronic kidney disease)    COPD (chronic obstructive pulmonary disease) ([place])    Depressed    Diabetes mellitus    Glaucoma    Hypercholesteremia    Hypertension    Legally blind in right eye, as defined in [place]    Panic attacks    Type 2 diabetes mellitus ([place])     Past Surgical History:  Procedure Laterality Date   APPENDECTOMY     BALLOON DILATION  07/16/2017   Procedure: BALLOON DILATION;  Surgeon: Danie Binder, MD;  [facility];  Service: Endoscopy;;   cataracts     CHOLECYSTECTOMY     ERCP N/A 07/16/2017   biliary papillary stenosis, filling defect c/w a stone and sludge, entire biliary tree dilated. completed biliary sphincterotomy and balloon extraction, stone removal   ERCP N/A 04/10/2021   Surgeon: Rogene Houston, MD;  prior biliary sphincterotomy appearing open, marked dilated CBD and CHD along with dilated intrahepatic biliary radicles, biliary sphincterotomy extended by 5 mm, large amount of debris/sludge removed from bile duct.   ESOPHAGOGASTRODUODENOSCOPY  2006    Dr. Gala Romney: normal esophagus s/p empiric dilataton, small hiatal hernia   HIP SURGERY     rod in leg as well   INTRAMEDULLARY (IM) NAIL INTERTROCHANTERIC Left 06/13/2020   Procedure: INTRAMEDULLARY (IM) NAIL INTERTROCHANTRIC;  Surgeon: [name], MD;  Location: [place];  Service: Orthopedics;  Laterality: Left;   MASS EXCISION Left 09/26/2017   Procedure: EXCISION MUCOID CYST WITH DISTAL INTERPHALANGEAL JOINT ARTHROTOMY OF LEFT MIDDLE FINGER;  Surgeon: Leanora Cover, MD;  Location: [place];  Service: Orthopedics;  Laterality: Left;   SPHINCTEROTOMY  07/16/2017   Procedure: SPHINCTEROTOMY;  Surgeon: Danie Binder, MD;  [facility];  Service: Endoscopy;;   SPHINCTEROTOMY N/A 04/10/2021   Procedure: Joan Mayans;  Surgeon: Rogene Houston, MD;  Location: [facility];  Service: Endoscopy;  Laterality: N/A;   TUBAL LIGATION      Social History   Socioeconomic History   Marital status: Divorced    Spouse name: Not on file   Number of children: Not on file   Years of education: Not on file   Highest education level: Not on file  Occupational History   Not on file  Tobacco Use  Smoking status: Never    Passive exposure: Current   Smokeless tobacco: Never  Vaping Use   Vaping Use: Never used  Substance and Sexual Activity   Alcohol use: No   Drug use: No   Sexual activity: Never  Other Topics Concern   Not on file  Social History Narrative   Not on file   Social Determinants of Health   Financial Resource Strain: Not on file  Food Insecurity: Not on file  Transportation Needs: Not on file  Physical Activity: Not on file  Stress: Not on file  Social Connections: Not on file  Intimate Partner Violence: Not on file    *** Family History  Problem Relation Age of Onset   Blindness Father    Bone cancer Father    Brain cancer Sister    Alzheimer's disease Brother    Colon cancer Neg Hx     Current Outpatient Medications  Medication Sig  Dispense Refill   albuterol (VENTOLIN HFA) 108 (90 Base) MCG/ACT inhaler Inhale 2 puffs into the lungs every 4 (four) hours as needed for wheezing or shortness of breath.     amLODipine (NORVASC) 5 MG tablet Take 5 mg by mouth daily.     docusate sodium (COLACE) 100 MG capsule Take 100 mg by mouth daily.     furosemide (LASIX) 20 MG tablet Take 20 mg daily as needed by mouth for fluid.     gabapentin (NEURONTIN) 100 MG capsule TAKE 1 CAPSULE BY MOUTH THREE TIMES A DAY (Patient taking differently: Take 100 mg by mouth 3 (three) times daily.) 60 capsule 5   omeprazole (PRILOSEC) 40 MG capsule Take 1 capsule (40 mg total) by mouth daily before breakfast. 90 capsule 1   pravastatin (PRAVACHOL) 40 MG tablet Take 40 mg by mouth daily.     traMADol (ULTRAM) 50 MG tablet Take 1 tablet (50 mg total) by mouth every 12 (twelve) hours as needed for severe pain. 15 tablet 0   TRELEGY ELLIPTA 100-62.5-25 MCG/ACT AEPB Inhale 1 puff into the lungs daily.     TRESIBA FLEXTOUCH 100 UNIT/ML FlexTouch Pen Inject 10 Units into the skin as needed.     ursodiol (ACTIGALL) 300 MG capsule Take 1 capsule (300 mg total) by mouth 2 (two) times daily. 180 capsule 3   XARELTO 15 MG TABS tablet Take 1 tablet (15 mg total) by mouth daily. Restart on 04/13/2021 42 tablet    No current facility-administered medications for this visit.    Allergies  Allergen Reactions   Hydromorphone Hcl Other (See Comments)    Patient goes out of right state of mind.     REVIEW OF SYSTEMS:  *** '[X]'$  denotes positive finding, '[ ]'$  denotes negative finding Cardiac  Comments:  Chest pain or chest pressure:    Shortness of breath upon exertion:    Short of breath when lying flat:    Irregular heart rhythm:        Vascular    Pain in calf, thigh, or hip brought on by ambulation:    Pain in feet at night that wakes you up from your sleep:     Blood clot in your veins:    Leg swelling:  x       Pulmonary    Oxygen at home:     Productive cough:     Wheezing:         Neurologic    Sudden weakness in arms or legs:     Sudden  numbness in arms or legs:     Sudden onset of difficulty speaking or slurred speech:    Temporary loss of vision in one eye:     Problems with dizziness:         Gastrointestinal    Blood in stool:     Vomited blood:         Genitourinary    Burning when urinating:     Blood in urine:        Psychiatric    Major depression:         Hematologic    Bleeding problems:    Problems with blood clotting too easily:        Skin    Rashes or ulcers:        Constitutional    Fever or chills:      PHYSICAL EXAMINATION:  ***  General:  WDWN in NAD; vital signs documented above Gait: Not observed HENT: WNL, normocephalic Pulmonary: normal non-labored breathing without wheezing Cardiac: {Desc; regular/irreg:14544} HR; {With/Without:20273} carotid bruit*** Abdomen: soft, NT, no masses; aortic pulse is *** palpable Skin: {With/Without:20273} rashes Vascular Exam/Pulses:  Right Left  Radial {Exam; arterial pulse strength 0-4:30167} {Exam; arterial pulse strength 0-4:30167}  DP {Exam; arterial pulse strength 0-4:30167} {Exam; arterial pulse strength 0-4:30167}  PT {Exam; arterial pulse strength 0-4:30167} {Exam; arterial pulse strength 0-4:30167}   Extremities: ***  Neurologic: A&O X 3;  moving all extremities equally Psychiatric:  The pt has {Desc; normal/abnormal:11317::"Normal"} affect.   Non-Invasive Vascular Imaging:   Venous duplex on 03/21/2022: ***   [name] is a 88 y.o. female who presents with: ***  -pt has *** pedal pulses -pt does *** have evidence of DVT.  Pt does ***have venous reflux *** -discussed with pt about wearing *** high *** mmHg compression stockings and pt was measured for these today.   *** -discussed the importance of leg elevation and how to elevate properly - pt is advised to elevate their legs and a diagram is given to them to  demonstrate for pt to lay flat on their back with knees elevated and slightly bent with their feet higher than their knees, which puts their feet higher than their heart for 15 minutes per day.  If pt cannot lay flat, advised to lay as flat as possible.  -pt is advised to continue as much walking as possible and avoid sitting or standing for long periods of time.  -discussed importance of weight loss and exercise and that water aerobics would also be beneficial.  -handout with recommendations given -pt will f/u ***   Leontine Locket, [facility] [phone]  Clinic MD:  Scot Dock

## 2022-03-21 ENCOUNTER — Ambulatory Visit (INDEPENDENT_AMBULATORY_CARE_PROVIDER_SITE_OTHER): Payer: Medicare Other | Admitting: Physician Assistant

## 2022-03-21 ENCOUNTER — Ambulatory Visit (HOSPITAL_COMMUNITY)
Admission: RE | Admit: 2022-03-21 | Discharge: 2022-03-21 | Disposition: A | Discharge status: Home / Self Care | Payer: Medicare Other | Source: Ambulatory Visit | Attending: Vascular Surgery | Admitting: Vascular Surgery

## 2022-03-21 VITALS — HR 89 | Temp 98.6°F | Resp 20 | Ht 62.0 in | Wt 167.0 lb

## 2022-03-21 DIAGNOSIS — M7989 Other specified soft tissue disorders: Secondary | ICD-10-CM

## 2022-04-12 DIAGNOSIS — C44329 Squamous cell carcinoma of skin of other parts of face: Secondary | ICD-10-CM | POA: Diagnosis not present

## 2022-04-24 DIAGNOSIS — L603 Nail dystrophy: Secondary | ICD-10-CM | POA: Diagnosis not present

## 2022-04-24 DIAGNOSIS — E1142 Type 2 diabetes mellitus with diabetic polyneuropathy: Secondary | ICD-10-CM | POA: Diagnosis not present

## 2022-05-08 DIAGNOSIS — M25571 Pain in right ankle and joints of right foot: Secondary | ICD-10-CM | POA: Diagnosis not present

## 2022-05-08 DIAGNOSIS — N390 Urinary tract infection, site not specified: Secondary | ICD-10-CM | POA: Diagnosis not present

## 2022-05-08 DIAGNOSIS — E1140 Type 2 diabetes mellitus with diabetic neuropathy, unspecified: Secondary | ICD-10-CM | POA: Diagnosis not present

## 2022-05-23 DIAGNOSIS — G894 Chronic pain syndrome: Secondary | ICD-10-CM | POA: Diagnosis not present

## 2022-05-23 DIAGNOSIS — J441 Chronic obstructive pulmonary disease with (acute) exacerbation: Secondary | ICD-10-CM | POA: Diagnosis not present

## 2022-06-07 DIAGNOSIS — G3184 Mild cognitive impairment, so stated: Secondary | ICD-10-CM | POA: Diagnosis not present

## 2022-06-07 DIAGNOSIS — H9202 Otalgia, left ear: Secondary | ICD-10-CM | POA: Diagnosis not present

## 2022-06-14 DIAGNOSIS — E1142 Type 2 diabetes mellitus with diabetic polyneuropathy: Secondary | ICD-10-CM | POA: Diagnosis not present

## 2022-06-14 DIAGNOSIS — E782 Mixed hyperlipidemia: Secondary | ICD-10-CM | POA: Diagnosis not present

## 2022-06-18 DIAGNOSIS — E1142 Type 2 diabetes mellitus with diabetic polyneuropathy: Secondary | ICD-10-CM | POA: Diagnosis not present

## 2022-06-19 DIAGNOSIS — R600 Localized edema: Secondary | ICD-10-CM | POA: Diagnosis not present

## 2022-06-19 DIAGNOSIS — Z23 Encounter for immunization: Secondary | ICD-10-CM | POA: Diagnosis not present

## 2022-06-19 DIAGNOSIS — G894 Chronic pain syndrome: Secondary | ICD-10-CM | POA: Diagnosis not present

## 2022-06-19 DIAGNOSIS — E1140 Type 2 diabetes mellitus with diabetic neuropathy, unspecified: Secondary | ICD-10-CM | POA: Diagnosis not present

## 2022-06-19 DIAGNOSIS — E782 Mixed hyperlipidemia: Secondary | ICD-10-CM | POA: Diagnosis not present

## 2022-06-19 DIAGNOSIS — E1142 Type 2 diabetes mellitus with diabetic polyneuropathy: Secondary | ICD-10-CM | POA: Diagnosis not present

## 2022-06-19 DIAGNOSIS — Z0000 Encounter for general adult medical examination without abnormal findings: Secondary | ICD-10-CM | POA: Diagnosis not present

## 2022-06-19 DIAGNOSIS — I10 Essential (primary) hypertension: Secondary | ICD-10-CM | POA: Diagnosis not present

## 2022-06-19 DIAGNOSIS — F419 Anxiety disorder, unspecified: Secondary | ICD-10-CM | POA: Diagnosis not present

## 2022-06-19 DIAGNOSIS — I480 Paroxysmal atrial fibrillation: Secondary | ICD-10-CM | POA: Diagnosis not present

## 2022-06-19 DIAGNOSIS — K219 Gastro-esophageal reflux disease without esophagitis: Secondary | ICD-10-CM | POA: Diagnosis not present

## 2022-06-19 DIAGNOSIS — N184 Chronic kidney disease, stage 4 (severe): Secondary | ICD-10-CM | POA: Diagnosis not present

## 2022-06-20 ENCOUNTER — Encounter: Payer: Self-pay | Admitting: Internal Medicine

## 2022-06-20 DIAGNOSIS — E211 Secondary hyperparathyroidism, not elsewhere classified: Secondary | ICD-10-CM | POA: Diagnosis not present

## 2022-06-20 DIAGNOSIS — E1129 Type 2 diabetes mellitus with other diabetic kidney complication: Secondary | ICD-10-CM | POA: Diagnosis not present

## 2022-06-20 DIAGNOSIS — R809 Proteinuria, unspecified: Secondary | ICD-10-CM | POA: Diagnosis not present

## 2022-06-20 DIAGNOSIS — E870 Hyperosmolality and hypernatremia: Secondary | ICD-10-CM | POA: Diagnosis not present

## 2022-06-20 DIAGNOSIS — E1122 Type 2 diabetes mellitus with diabetic chronic kidney disease: Secondary | ICD-10-CM | POA: Diagnosis not present

## 2022-06-20 DIAGNOSIS — I77810 Thoracic aortic ectasia: Secondary | ICD-10-CM | POA: Diagnosis not present

## 2022-06-20 DIAGNOSIS — I129 Hypertensive chronic kidney disease with stage 1 through stage 4 chronic kidney disease, or unspecified chronic kidney disease: Secondary | ICD-10-CM | POA: Diagnosis not present

## 2022-06-20 DIAGNOSIS — N189 Chronic kidney disease, unspecified: Secondary | ICD-10-CM | POA: Diagnosis not present

## 2022-06-21 DIAGNOSIS — C44329 Squamous cell carcinoma of skin of other parts of face: Secondary | ICD-10-CM | POA: Diagnosis not present

## 2022-06-21 DIAGNOSIS — Z08 Encounter for follow-up examination after completed treatment for malignant neoplasm: Secondary | ICD-10-CM | POA: Diagnosis not present

## 2022-06-21 DIAGNOSIS — C44319 Basal cell carcinoma of skin of other parts of face: Secondary | ICD-10-CM | POA: Diagnosis not present

## 2022-06-21 DIAGNOSIS — Z85828 Personal history of other malignant neoplasm of skin: Secondary | ICD-10-CM | POA: Diagnosis not present

## 2022-06-21 DIAGNOSIS — C44311 Basal cell carcinoma of skin of nose: Secondary | ICD-10-CM | POA: Diagnosis not present

## 2022-06-21 DIAGNOSIS — C4402 Squamous cell carcinoma of skin of lip: Secondary | ICD-10-CM | POA: Diagnosis not present

## 2022-07-17 DIAGNOSIS — E1142 Type 2 diabetes mellitus with diabetic polyneuropathy: Secondary | ICD-10-CM | POA: Diagnosis not present

## 2022-07-17 DIAGNOSIS — L603 Nail dystrophy: Secondary | ICD-10-CM | POA: Diagnosis not present

## 2022-07-24 ENCOUNTER — Encounter: Payer: Self-pay | Admitting: Cardiology

## 2022-07-24 ENCOUNTER — Ambulatory Visit: Payer: Medicare Other | Attending: Cardiology | Admitting: Cardiology

## 2022-07-24 VITALS — BP 120/70 | HR 72 | Ht 63.0 in | Wt 177.0 lb

## 2022-07-24 DIAGNOSIS — I10 Essential (primary) hypertension: Secondary | ICD-10-CM | POA: Insufficient documentation

## 2022-07-24 DIAGNOSIS — E782 Mixed hyperlipidemia: Secondary | ICD-10-CM | POA: Diagnosis not present

## 2022-07-24 DIAGNOSIS — I4891 Unspecified atrial fibrillation: Secondary | ICD-10-CM | POA: Insufficient documentation

## 2022-07-24 DIAGNOSIS — D6869 Other thrombophilia: Secondary | ICD-10-CM | POA: Insufficient documentation

## 2022-07-24 NOTE — Patient Instructions (Signed)
Medication Instructions:  Continue all current medications.   Labwork: none  Testing/Procedures: none  Follow-Up: 6 months   Any Other Special Instructions Will Be Listed Below (If Applicable).   If you need a refill on your cardiac medications before your next appointment, please call your pharmacy.  

## 2022-07-24 NOTE — Progress Notes (Signed)
Clinical Summary [name] is a 88 y.o.female seen today for follow up of the following medical probems   1. Afib - noted during 06/2017 admission after ERCP - did not require av nodal agents  - no palpitations.  - no bleeding on xarelto   2. Possible descending aortic dissection During 06/2017 admission suggestive findings on MRI but not present by CT, could not use contrast due to renal dysfunction - not further pursued - at her advancd age and comorbidities would not repeat any imaging to reassess -Jan 2023 echo aortic root 40 mm   3. LE edema - Jan 2023 echo LVEF 60-65%, no WMAs, indet diastolic fxn. Normal RV.  - progessing LLE edema x 3 weeks.   -seen by vascular 02/2022. [place] neg for DVT, signs of venous reflux GSV prox calf. Not candidate for laser ablation. Recs for compression stockigns, elevation  4. Epigastric pain - heaviness epigastric area, often comes on with laying down. Can improve with position. Short in duration.   5. CKD - followed by Dr Theador Hawthorne  6. Gait instability - walks with walker at home, wheelchair at times.   7.Hyperlipidemia - she is on pravastatin - 05/2022 TC 128 TG 79 HDL 47 LDL 65   Past Medical History:  Diagnosis Date   Acid reflux    Anxiety    CKD (chronic kidney disease)    COPD (chronic obstructive pulmonary disease) ([facility])    Depressed    Diabetes mellitus    Glaucoma    Hypercholesteremia    Hypertension    Legally blind in right eye, as defined in [place]    Panic attacks    Type 2 diabetes mellitus ([place])      Allergies  Allergen Reactions   Hydromorphone Hcl Other (See Comments)    Patient goes out of right state of mind.      Current Outpatient Medications  Medication Sig Dispense Refill   albuterol (VENTOLIN HFA) 108 (90 Base) MCG/ACT inhaler Inhale 2 puffs into the lungs every 4 (four) hours as needed for wheezing or shortness of breath.     amLODipine (NORVASC) 5 MG tablet Take 5 mg by mouth daily.      cephALEXin (KEFLEX) 500 MG capsule Take 500 mg by mouth every 12 (twelve) hours.     docusate sodium (COLACE) 100 MG capsule Take 100 mg by mouth daily.     furosemide (LASIX) 20 MG tablet Take 20 mg daily as needed by mouth for fluid.     gabapentin (NEURONTIN) 100 MG capsule TAKE 1 CAPSULE BY MOUTH THREE TIMES A DAY (Patient taking differently: Take 100 mg by mouth 3 (three) times daily.) 60 capsule 5   omeprazole (PRILOSEC) 40 MG capsule Take 1 capsule (40 mg total) by mouth daily before breakfast. 90 capsule 1   pravastatin (PRAVACHOL) 40 MG tablet Take 40 mg by mouth daily.     traMADol (ULTRAM) 50 MG tablet Take 1 tablet (50 mg total) by mouth every 12 (twelve) hours as needed for severe pain. 15 tablet 0   TRELEGY ELLIPTA 100-62.5-25 MCG/ACT AEPB Inhale 1 puff into the lungs daily.     TRESIBA FLEXTOUCH 100 UNIT/ML FlexTouch Pen Inject 10 Units into the skin as needed.     ursodiol (ACTIGALL) 300 MG capsule Take 1 capsule (300 mg total) by mouth 2 (two) times daily. 180 capsule 3   XARELTO 15 MG TABS tablet Take 1 tablet (15 mg total) by mouth daily. Restart  on 04/13/2021 42 tablet    No current facility-administered medications for this visit.     Past Surgical History:  Procedure Laterality Date   APPENDECTOMY     BALLOON DILATION  07/16/2017   Procedure: BALLOON DILATION;  Surgeon: Danie Binder, MD;  [facility];  Service: Endoscopy;;   cataracts     CHOLECYSTECTOMY     ERCP N/A 07/16/2017   biliary papillary stenosis, filling defect c/w a stone and sludge, entire biliary tree dilated. completed biliary sphincterotomy and balloon extraction, stone removal   ERCP N/A 04/10/2021   Surgeon: Rogene Houston, MD;  prior biliary sphincterotomy appearing open, marked dilated CBD and CHD along with dilated intrahepatic biliary radicles, biliary sphincterotomy extended by 5 mm, large amount of debris/sludge removed from bile duct.   ESOPHAGOGASTRODUODENOSCOPY  2006   Dr.  Gala Romney: normal esophagus s/p empiric dilataton, small hiatal hernia   HIP SURGERY     rod in leg as well   INTRAMEDULLARY (IM) NAIL INTERTROCHANTERIC Left 06/13/2020   Procedure: INTRAMEDULLARY (IM) NAIL INTERTROCHANTRIC;  Surgeon: [name], MD;  Location: [place];  Service: Orthopedics;  Laterality: Left;   MASS EXCISION Left 09/26/2017   Procedure: EXCISION MUCOID CYST WITH DISTAL INTERPHALANGEAL JOINT ARTHROTOMY OF LEFT MIDDLE FINGER;  Surgeon: Leanora Cover, MD;  Location: [place];  Service: Orthopedics;  Laterality: Left;   SPHINCTEROTOMY  07/16/2017   Procedure: SPHINCTEROTOMY;  Surgeon: Danie Binder, MD;  [facility];  Service: Endoscopy;;   SPHINCTEROTOMY N/A 04/10/2021   Procedure: Joan Mayans;  Surgeon: Rogene Houston, MD;  [facility];  Service: Endoscopy;  Laterality: N/A;   TUBAL LIGATION       Allergies  Allergen Reactions   Hydromorphone Hcl Other (See Comments)    Patient goes out of right state of mind.       Family History  Problem Relation Age of Onset   Blindness Father    Bone cancer Father    Brain cancer Sister    Alzheimer's disease Brother    Colon cancer Neg Hx      Social History Ms. Cherian reports that she has never smoked. She has been exposed to tobacco smoke. She has never used smokeless tobacco. Ms. Shadrick reports no history of alcohol use.   Review of Systems CONSTITUTIONAL: No weight loss, fever, chills, weakness or fatigue.  HEENT: Eyes: No visual loss, blurred vision, double vision or yellow sclerae.No hearing loss, sneezing, congestion, runny nose or sore throat.  SKIN: No rash or itching.  CARDIOVASCULAR: per hpi RESPIRATORY: No shortness of breath, cough or sputum.  GASTROINTESTINAL: No anorexia, nausea, vomiting or diarrhea. No abdominal pain or blood.  GENITOURINARY: No burning on urination, no polyuria NEUROLOGICAL: No headache, dizziness, syncope, paralysis, ataxia, numbness or  tingling in the extremities. No change in bowel or bladder control.  MUSCULOSKELETAL: No muscle, back pain, joint pain or stiffness.  LYMPHATICS: No enlarged nodes. No history of splenectomy.  PSYCHIATRIC: No history of depression or anxiety.  ENDOCRINOLOGIC: No reports of sweating, cold or heat intolerance. No polyuria or polydipsia.  Marland Kitchen   Physical Examination Today's Vitals   07/24/22 1100  BP: 120/70  Pulse: 72  SpO2: 97%  Weight: 177 lb (80.3 kg)  Height: '5\' 3"'$  (1.6 m)   Body mass index is 31.35 kg/m.  Gen: resting comfortably, no acute distress HEENT: no scleral icterus, pupils equal round and reactive, no palptable cervical adenopathy,  CV: irreg, no m/r/g, no jvd  Resp: Clear to auscultation bilaterally GI: abdomen is soft, non-tender, non-distended, normal bowel sounds, no hepatosplenomegaly MSK: extremities are warm, no edema.  Skin: warm, no rash Neuro:  no focal deficits Psych: appropriate affect   Diagnostic Studies Jan 2023 echo 1. Left ventricular ejection fraction, by estimation, is 60 to 65%. The  left ventricle has normal function. The left ventricle has no regional  wall motion abnormalities. There is mild left ventricular hypertrophy.  Left ventricular diastolic parameters  are indeterminate.   2. Right ventricular systolic function is normal. The right ventricular  size is normal. There is normal pulmonary artery systolic pressure.   3. Left atrial size was severely dilated.   4. The mitral valve is normal in structure. Trivial mitral valve  regurgitation. No evidence of mitral stenosis.   5. The aortic valve is tricuspid. Aortic valve regurgitation is mild.   6. Aortic dilatation noted. There is mild dilatation of the aortic root,  measuring 40 mm.   7. The inferior vena cava is normal in size with greater than 50%  respiratory variability, suggesting right atrial pressure of 3 mmHg.    02/2022 LLE venous US Summary:  Left:  - No evidence of deep  vein thrombosis seen in the left lower extremity,  from the common femoral through the popliteal veins.  - No evidence of superficial venous thrombosis in the left lower  extremity.    - Venous reflux is noted in the left greater saphenous vein in the calf.    Assessment and Plan  1. Afib/acquired thrombophilia - she does not require av nodal agents, afib with slow VR previously. Has been self rate controlled -no symptoms, doing well on xarelto. Continue current meds - EKG today shows rate controlled afib     2. HTN -at goal, continue current meds  3. Hyperlipidemia -at goal, continue current meds     Arnoldo Lenis, M.D.

## 2022-08-06 DIAGNOSIS — Z85828 Personal history of other malignant neoplasm of skin: Secondary | ICD-10-CM | POA: Diagnosis not present

## 2022-08-06 DIAGNOSIS — L570 Actinic keratosis: Secondary | ICD-10-CM | POA: Diagnosis not present

## 2022-08-06 DIAGNOSIS — D0439 Carcinoma in situ of skin of other parts of face: Secondary | ICD-10-CM | POA: Diagnosis not present

## 2022-08-06 DIAGNOSIS — Z08 Encounter for follow-up examination after completed treatment for malignant neoplasm: Secondary | ICD-10-CM | POA: Diagnosis not present

## 2022-08-06 DIAGNOSIS — C44329 Squamous cell carcinoma of skin of other parts of face: Secondary | ICD-10-CM | POA: Diagnosis not present

## 2022-08-06 DIAGNOSIS — X32XXXD Exposure to sunlight, subsequent encounter: Secondary | ICD-10-CM | POA: Diagnosis not present

## 2022-08-06 DIAGNOSIS — C4402 Squamous cell carcinoma of skin of lip: Secondary | ICD-10-CM | POA: Diagnosis not present

## 2022-08-31 DIAGNOSIS — J441 Chronic obstructive pulmonary disease with (acute) exacerbation: Secondary | ICD-10-CM | POA: Diagnosis not present

## 2022-09-07 ENCOUNTER — Other Ambulatory Visit: Payer: Self-pay | Admitting: Gastroenterology

## 2022-09-07 DIAGNOSIS — K838 Other specified diseases of biliary tract: Secondary | ICD-10-CM

## 2022-09-20 DIAGNOSIS — E1142 Type 2 diabetes mellitus with diabetic polyneuropathy: Secondary | ICD-10-CM | POA: Diagnosis not present

## 2022-09-20 DIAGNOSIS — E782 Mixed hyperlipidemia: Secondary | ICD-10-CM | POA: Diagnosis not present

## 2022-09-25 DIAGNOSIS — E1142 Type 2 diabetes mellitus with diabetic polyneuropathy: Secondary | ICD-10-CM | POA: Diagnosis not present

## 2022-09-25 DIAGNOSIS — L603 Nail dystrophy: Secondary | ICD-10-CM | POA: Diagnosis not present

## 2022-09-27 DIAGNOSIS — E1142 Type 2 diabetes mellitus with diabetic polyneuropathy: Secondary | ICD-10-CM | POA: Diagnosis not present

## 2022-09-27 DIAGNOSIS — I10 Essential (primary) hypertension: Secondary | ICD-10-CM | POA: Diagnosis not present

## 2022-09-27 DIAGNOSIS — I700 Atherosclerosis of aorta: Secondary | ICD-10-CM | POA: Diagnosis not present

## 2022-09-27 DIAGNOSIS — G894 Chronic pain syndrome: Secondary | ICD-10-CM | POA: Diagnosis not present

## 2022-09-27 DIAGNOSIS — E1122 Type 2 diabetes mellitus with diabetic chronic kidney disease: Secondary | ICD-10-CM | POA: Diagnosis not present

## 2022-09-27 DIAGNOSIS — I129 Hypertensive chronic kidney disease with stage 1 through stage 4 chronic kidney disease, or unspecified chronic kidney disease: Secondary | ICD-10-CM | POA: Diagnosis not present

## 2022-09-27 DIAGNOSIS — E785 Hyperlipidemia, unspecified: Secondary | ICD-10-CM | POA: Diagnosis not present

## 2022-09-27 DIAGNOSIS — F419 Anxiety disorder, unspecified: Secondary | ICD-10-CM | POA: Diagnosis not present

## 2022-09-27 DIAGNOSIS — I480 Paroxysmal atrial fibrillation: Secondary | ICD-10-CM | POA: Diagnosis not present

## 2022-09-27 DIAGNOSIS — E782 Mixed hyperlipidemia: Secondary | ICD-10-CM | POA: Diagnosis not present

## 2022-09-27 DIAGNOSIS — N184 Chronic kidney disease, stage 4 (severe): Secondary | ICD-10-CM | POA: Diagnosis not present

## 2022-09-27 DIAGNOSIS — K219 Gastro-esophageal reflux disease without esophagitis: Secondary | ICD-10-CM | POA: Diagnosis not present

## 2022-10-04 DIAGNOSIS — I129 Hypertensive chronic kidney disease with stage 1 through stage 4 chronic kidney disease, or unspecified chronic kidney disease: Secondary | ICD-10-CM | POA: Diagnosis not present

## 2022-10-04 DIAGNOSIS — E1129 Type 2 diabetes mellitus with other diabetic kidney complication: Secondary | ICD-10-CM | POA: Diagnosis not present

## 2022-10-04 DIAGNOSIS — R809 Proteinuria, unspecified: Secondary | ICD-10-CM | POA: Diagnosis not present

## 2022-10-04 DIAGNOSIS — E1122 Type 2 diabetes mellitus with diabetic chronic kidney disease: Secondary | ICD-10-CM | POA: Diagnosis not present

## 2022-10-04 DIAGNOSIS — N189 Chronic kidney disease, unspecified: Secondary | ICD-10-CM | POA: Diagnosis not present

## 2022-10-04 DIAGNOSIS — E211 Secondary hyperparathyroidism, not elsewhere classified: Secondary | ICD-10-CM | POA: Diagnosis not present

## 2022-10-19 DIAGNOSIS — J441 Chronic obstructive pulmonary disease with (acute) exacerbation: Secondary | ICD-10-CM | POA: Diagnosis not present

## 2022-10-19 DIAGNOSIS — U071 COVID-19: Secondary | ICD-10-CM | POA: Diagnosis not present

## 2022-11-14 DIAGNOSIS — X32XXXD Exposure to sunlight, subsequent encounter: Secondary | ICD-10-CM | POA: Diagnosis not present

## 2022-11-14 DIAGNOSIS — Z85828 Personal history of other malignant neoplasm of skin: Secondary | ICD-10-CM | POA: Diagnosis not present

## 2022-11-14 DIAGNOSIS — C44311 Basal cell carcinoma of skin of nose: Secondary | ICD-10-CM | POA: Diagnosis not present

## 2022-11-14 DIAGNOSIS — Z08 Encounter for follow-up examination after completed treatment for malignant neoplasm: Secondary | ICD-10-CM | POA: Diagnosis not present

## 2022-11-14 DIAGNOSIS — L570 Actinic keratosis: Secondary | ICD-10-CM | POA: Diagnosis not present

## 2022-12-18 DIAGNOSIS — E1142 Type 2 diabetes mellitus with diabetic polyneuropathy: Secondary | ICD-10-CM | POA: Diagnosis not present

## 2022-12-18 DIAGNOSIS — L603 Nail dystrophy: Secondary | ICD-10-CM | POA: Diagnosis not present

## 2022-12-27 DIAGNOSIS — Z85828 Personal history of other malignant neoplasm of skin: Secondary | ICD-10-CM | POA: Diagnosis not present

## 2022-12-27 DIAGNOSIS — L258 Unspecified contact dermatitis due to other agents: Secondary | ICD-10-CM | POA: Diagnosis not present

## 2022-12-27 DIAGNOSIS — Z08 Encounter for follow-up examination after completed treatment for malignant neoplasm: Secondary | ICD-10-CM | POA: Diagnosis not present

## 2023-01-04 DIAGNOSIS — E1142 Type 2 diabetes mellitus with diabetic polyneuropathy: Secondary | ICD-10-CM | POA: Diagnosis not present

## 2023-01-04 DIAGNOSIS — E559 Vitamin D deficiency, unspecified: Secondary | ICD-10-CM | POA: Diagnosis not present

## 2023-01-04 DIAGNOSIS — E782 Mixed hyperlipidemia: Secondary | ICD-10-CM | POA: Diagnosis not present

## 2023-01-10 ENCOUNTER — Encounter: Payer: Self-pay | Admitting: Internal Medicine

## 2023-01-10 DIAGNOSIS — E1122 Type 2 diabetes mellitus with diabetic chronic kidney disease: Secondary | ICD-10-CM | POA: Diagnosis not present

## 2023-01-10 DIAGNOSIS — I700 Atherosclerosis of aorta: Secondary | ICD-10-CM | POA: Diagnosis not present

## 2023-01-10 DIAGNOSIS — G894 Chronic pain syndrome: Secondary | ICD-10-CM | POA: Diagnosis not present

## 2023-01-10 DIAGNOSIS — K219 Gastro-esophageal reflux disease without esophagitis: Secondary | ICD-10-CM | POA: Diagnosis not present

## 2023-01-10 DIAGNOSIS — E782 Mixed hyperlipidemia: Secondary | ICD-10-CM | POA: Diagnosis not present

## 2023-01-10 DIAGNOSIS — R600 Localized edema: Secondary | ICD-10-CM | POA: Diagnosis not present

## 2023-01-10 DIAGNOSIS — F419 Anxiety disorder, unspecified: Secondary | ICD-10-CM | POA: Diagnosis not present

## 2023-01-10 DIAGNOSIS — E785 Hyperlipidemia, unspecified: Secondary | ICD-10-CM | POA: Diagnosis not present

## 2023-01-10 DIAGNOSIS — N184 Chronic kidney disease, stage 4 (severe): Secondary | ICD-10-CM | POA: Diagnosis not present

## 2023-01-10 DIAGNOSIS — I129 Hypertensive chronic kidney disease with stage 1 through stage 4 chronic kidney disease, or unspecified chronic kidney disease: Secondary | ICD-10-CM | POA: Diagnosis not present

## 2023-01-10 DIAGNOSIS — I10 Essential (primary) hypertension: Secondary | ICD-10-CM | POA: Diagnosis not present

## 2023-01-10 DIAGNOSIS — I480 Paroxysmal atrial fibrillation: Secondary | ICD-10-CM | POA: Diagnosis not present

## 2023-01-16 ENCOUNTER — Ambulatory Visit: Payer: Medicare Other | Attending: Cardiology | Admitting: Cardiology

## 2023-01-16 VITALS — BP 106/60 | HR 72 | Ht 63.0 in | Wt 180.0 lb

## 2023-01-16 DIAGNOSIS — I10 Essential (primary) hypertension: Secondary | ICD-10-CM | POA: Insufficient documentation

## 2023-01-16 DIAGNOSIS — J441 Chronic obstructive pulmonary disease with (acute) exacerbation: Secondary | ICD-10-CM | POA: Insufficient documentation

## 2023-01-16 DIAGNOSIS — E782 Mixed hyperlipidemia: Secondary | ICD-10-CM | POA: Insufficient documentation

## 2023-01-16 DIAGNOSIS — D6869 Other thrombophilia: Secondary | ICD-10-CM | POA: Insufficient documentation

## 2023-01-16 DIAGNOSIS — I4891 Unspecified atrial fibrillation: Secondary | ICD-10-CM | POA: Diagnosis not present

## 2023-01-16 MED ORDER — PREDNISONE 20 MG PO TABS: 40.0000 mg | ORAL_TABLET | Freq: Every day | ORAL | 0 refills | Status: AC

## 2023-01-16 NOTE — Patient Instructions (Addendum)
Medication Instructions:  Your physician has recommended you make the following change in your medication: Take Prednisone 40 mg for 5 days. Take all other medications the same.  Labwork: None  Testing/Procedures: None  Follow-Up: Your physician recommends that you schedule a follow-up appointment in: 6 months  Any Other Special Instructions Will Be Listed Below (If Applicable).  If you need a refill on your cardiac medications before your next appointment, please call your pharmacy.

## 2023-01-16 NOTE — Progress Notes (Signed)
Clinical Summary [name] is a 89 y.o.female seen today for follow up of the following medical probems   1. Afib - noted during 06/2017 admission after ERCP - did not require av nodal agents    No recent palpitations.  - no bleeding xarelto   2. Possible descending aortic dissection During 06/2017 admission suggestive findings on MRI but not present by CT, could not use contrast due to renal dysfunction - not further pursued - at her advancd age and comorbidities would not repeat any imaging to reassess -Jan 2023 echo aortic root 40 mm   3. LE edema - Jan 2023 echo LVEF 60-65%, no WMAs, indet diastolic fxn. Normal RV.  - progessing LLE edema x 3 weeks.    -seen by vascular 02/2022. [place] neg for DVT, signs of venous reflux GSV prox calf. Not candidate for laser ablation. Recs for compression stockigns, elevation - chronic stable swelling, overall mild    4. CKD - followed by Dr Wolfgang Phoenix   5. Gait instability - walks with walker at home, wheelchair at times.    6.Hyperlipidemia - she is on pravastatin - 05/2022 TC 128 TG 79 HDL 47 LDL 65  7. Cough/wheezing - recent productive cough, increased wheezing - started on abx by pcp   Past Medical History:  Diagnosis Date   Acid reflux    Anxiety    CKD (chronic kidney disease)    COPD (chronic obstructive pulmonary disease) (HCC)    Depressed    Diabetes mellitus    Glaucoma    Hypercholesteremia    Hypertension    Legally blind in right eye, as defined in [place]    Panic attacks    Type 2 diabetes mellitus (HCC)      Allergies  Allergen Reactions   Hydromorphone Hcl Other (See Comments)    Patient goes out of right state of mind.      Current Outpatient Medications  Medication Sig Dispense Refill   albuterol (VENTOLIN HFA) 108 (90 Base) MCG/ACT inhaler Inhale 2 puffs into the lungs every 4 (four) hours as needed for wheezing or shortness of breath.     amLODipine (NORVASC) 5 MG tablet Take 5 mg by mouth  daily.     docusate sodium (COLACE) 100 MG capsule Take 100 mg by mouth daily.     furosemide (LASIX) 20 MG tablet Take 20 mg daily as needed by mouth for fluid.     gabapentin (NEURONTIN) 100 MG capsule TAKE 1 CAPSULE BY MOUTH THREE TIMES A DAY (Patient taking differently: Take 100 mg by mouth 3 (three) times daily.) 60 capsule 5   omeprazole (PRILOSEC) 40 MG capsule Take 1 capsule (40 mg total) by mouth daily before breakfast. 90 capsule 1   pravastatin (PRAVACHOL) 40 MG tablet Take 40 mg by mouth daily.     traMADol (ULTRAM) 50 MG tablet Take 1 tablet (50 mg total) by mouth every 12 (twelve) hours as needed for severe pain. 15 tablet 0   TRELEGY ELLIPTA 100-62.5-25 MCG/ACT AEPB Inhale 1 puff into the lungs daily.     TRESIBA FLEXTOUCH 100 UNIT/ML FlexTouch Pen Inject 10 Units into the skin daily.     ursodiol (ACTIGALL) 300 MG capsule TAKE ONE CAPSULE BY MOUTH EVERY MORNING and TAKE ONE CAPSULE BY MOUTH AT NOON 180 capsule 1   XARELTO 15 MG TABS tablet Take 1 tablet (15 mg total) by mouth daily. Restart on 04/13/2021 42 tablet    No current facility-administered medications  for this visit.     Past Surgical History:  Procedure Laterality Date   APPENDECTOMY     BALLOON DILATION  07/16/2017   Procedure: BALLOON DILATION;  Surgeon: Michaela Bali, MD;  Location: [facility];  Service: Endoscopy;;   cataracts     CHOLECYSTECTOMY     ERCP N/A 07/16/2017   biliary papillary stenosis, filling defect c/w a stone and sludge, entire biliary tree dilated. completed biliary sphincterotomy and balloon extraction, stone removal   ERCP N/A 04/10/2021   Surgeon: Michaela Hippo, MD;  prior biliary sphincterotomy appearing open, marked dilated CBD and CHD along with dilated intrahepatic biliary radicles, biliary sphincterotomy extended by 5 mm, large amount of debris/sludge removed from bile duct.   ESOPHAGOGASTRODUODENOSCOPY  2006   [name]: normal esophagus s/p empiric dilataton, small hiatal  hernia   HIP SURGERY     rod in leg as well   INTRAMEDULLARY (IM) NAIL INTERTROCHANTERIC Left 06/13/2020   Procedure: INTRAMEDULLARY (IM) NAIL INTERTROCHANTRIC;  Surgeon: Michaela Romans, MD;  Location: [facility];  Service: Orthopedics;  Laterality: Left;   MASS EXCISION Left 09/26/2017   Procedure: EXCISION MUCOID CYST WITH DISTAL INTERPHALANGEAL JOINT ARTHROTOMY OF LEFT MIDDLE FINGER;  Surgeon: Michaela Loa, MD;  Location: [facility];  Service: Orthopedics;  Laterality: Left;   SPHINCTEROTOMY  07/16/2017   Procedure: SPHINCTEROTOMY;  Surgeon: Michaela Bali, MD;  Location: [facility];  Service: Endoscopy;;   SPHINCTEROTOMY N/A 04/10/2021   Procedure: [name];  Surgeon: Michaela Hippo, MD;  Location: [facility];  Service: Endoscopy;  Laterality: N/A;   TUBAL LIGATION       Allergies  Allergen Reactions   Hydromorphone Hcl Other (See Comments)    Patient goes out of right state of mind.       Family History  Problem Relation Age of Onset   Blindness Father    Bone cancer Father    Brain cancer Sister    Alzheimer's disease Brother    Colon cancer Neg Hx      Social History Ms. Beliveau reports that she has never smoked. She has been exposed to tobacco smoke. She has never used smokeless tobacco. Ms. Mans reports no history of alcohol use.   Review of Systems CONSTITUTIONAL: No weight loss, fever, chills, weakness or fatigue.  HEENT: Eyes: No visual loss, blurred vision, double vision or yellow sclerae.No hearing loss, sneezing, congestion, runny nose or sore throat.  SKIN: No rash or itching.  CARDIOVASCULAR: per hpi RESPIRATORY: per hpi GASTROINTESTINAL: No anorexia, nausea, vomiting or diarrhea. No abdominal pain or blood.  GENITOURINARY: No burning on urination, no polyuria NEUROLOGICAL: No headache, dizziness, syncope, paralysis, ataxia, numbness or tingling in the extremities. No change in bowel or bladder control.  MUSCULOSKELETAL: No muscle,  back pain, joint pain or stiffness.  LYMPHATICS: No enlarged nodes. No history of splenectomy.  PSYCHIATRIC: No history of depression or anxiety.  ENDOCRINOLOGIC: No reports of sweating, cold or heat intolerance. No polyuria or polydipsia.  Marland Kitchen   Physical Examination Today's Vitals   01/16/23 1200  BP: 106/60  Pulse: 72  SpO2: 100%  Weight: 180 lb (81.6 kg)  Height: 5\' 3"  (1.6 m)   Body mass index is 31.89 kg/m.  Gen: resting comfortably, no acute distress HEENT: no scleral icterus, pupils equal round and reactive, no palptable cervical adenopathy,  CV: irreg, no m/rg, no jvdd Resp:bilateral wheezing GI: abdomen is soft, non-tender, non-distended, normal bowel sounds, no hepatosplenomegaly MSK: extremities are warm,  no edema.  Skin: warm, no rash Neuro:  no focal deficits Psych: appropriate affect   Diagnostic Studies   Jan 2023 echo 1. Left ventricular ejection fraction, by estimation, is 60 to 65%. The  left ventricle has normal function. The left ventricle has no regional  wall motion abnormalities. There is mild left ventricular hypertrophy.  Left ventricular diastolic parameters  are indeterminate.   2. Right ventricular systolic function is normal. The right ventricular  size is normal. There is normal pulmonary artery systolic pressure.   3. Left atrial size was severely dilated.   4. The mitral valve is normal in structure. Trivial mitral valve  regurgitation. No evidence of mitral stenosis.   5. The aortic valve is tricuspid. Aortic valve regurgitation is mild.   6. Aortic dilatation noted. There is mild dilatation of the aortic root,  measuring 40 mm.   7. The inferior vena cava is normal in size with greater than 50%  respiratory variability, suggesting right atrial pressure of 3 mmHg.      02/2022 LLE venous US Summary:  Left:  - No evidence of deep vein thrombosis seen in the left lower extremity,  from the common femoral through the popliteal veins.   - No evidence of superficial venous thrombosis in the left lower  extremity.    - Venous reflux is noted in the left greater saphenous vein in the calf.     Assessment and Plan   1. Afib/acquired thrombophilia - she does not require av nodal agents, afib with slow VR previously. Has been self rate controlled -denies any significant symptoms, continue current meds including xarelto for stroke prevention   2. HTN -she is at goal, continue current meds   3. Hyperlipidemia -lipids are at goal, continue current meds  4. COPD exacerbation - already on abx per pcp - significant wheezing on exam today, plan to add 5 days of prednisone 40mg    F/u 6 months  [name], M.D

## 2023-01-17 DIAGNOSIS — H401133 Primary open-angle glaucoma, bilateral, severe stage: Secondary | ICD-10-CM | POA: Diagnosis not present

## 2023-01-17 DIAGNOSIS — E119 Type 2 diabetes mellitus without complications: Secondary | ICD-10-CM | POA: Diagnosis not present

## 2023-01-17 DIAGNOSIS — Z961 Presence of intraocular lens: Secondary | ICD-10-CM | POA: Diagnosis not present

## 2023-01-17 DIAGNOSIS — H353131 Nonexudative age-related macular degeneration, bilateral, early dry stage: Secondary | ICD-10-CM | POA: Diagnosis not present

## 2023-01-17 DIAGNOSIS — H1045 Other chronic allergic conjunctivitis: Secondary | ICD-10-CM | POA: Diagnosis not present

## 2023-01-17 DIAGNOSIS — H04123 Dry eye syndrome of bilateral lacrimal glands: Secondary | ICD-10-CM | POA: Diagnosis not present

## 2023-01-22 DIAGNOSIS — J449 Chronic obstructive pulmonary disease, unspecified: Secondary | ICD-10-CM | POA: Diagnosis not present

## 2023-01-22 DIAGNOSIS — I480 Paroxysmal atrial fibrillation: Secondary | ICD-10-CM | POA: Diagnosis not present

## 2023-01-22 DIAGNOSIS — I10 Essential (primary) hypertension: Secondary | ICD-10-CM | POA: Diagnosis not present

## 2023-01-22 DIAGNOSIS — J069 Acute upper respiratory infection, unspecified: Secondary | ICD-10-CM | POA: Diagnosis not present

## 2023-02-14 DIAGNOSIS — M5442 Lumbago with sciatica, left side: Secondary | ICD-10-CM | POA: Diagnosis not present

## 2023-02-14 DIAGNOSIS — Z79899 Other long term (current) drug therapy: Secondary | ICD-10-CM | POA: Diagnosis not present

## 2023-03-07 ENCOUNTER — Other Ambulatory Visit: Payer: Self-pay | Admitting: Gastroenterology

## 2023-03-07 DIAGNOSIS — K838 Other specified diseases of biliary tract: Secondary | ICD-10-CM

## 2023-03-08 NOTE — Telephone Encounter (Signed)
Noted. If unable to reach her next week, please mail a letter letting her know to call to schedule a follow-up.

## 2023-03-08 NOTE — Telephone Encounter (Signed)
Please reach patient to schedule routine follow-up.

## 2023-03-25 ENCOUNTER — Encounter: Payer: Self-pay | Admitting: Gastroenterology

## 2023-03-29 DIAGNOSIS — R350 Frequency of micturition: Secondary | ICD-10-CM | POA: Diagnosis not present

## 2023-04-17 DIAGNOSIS — E559 Vitamin D deficiency, unspecified: Secondary | ICD-10-CM | POA: Diagnosis not present

## 2023-04-17 DIAGNOSIS — E782 Mixed hyperlipidemia: Secondary | ICD-10-CM | POA: Diagnosis not present

## 2023-04-17 DIAGNOSIS — E1142 Type 2 diabetes mellitus with diabetic polyneuropathy: Secondary | ICD-10-CM | POA: Diagnosis not present

## 2023-04-23 DIAGNOSIS — I129 Hypertensive chronic kidney disease with stage 1 through stage 4 chronic kidney disease, or unspecified chronic kidney disease: Secondary | ICD-10-CM | POA: Diagnosis not present

## 2023-04-23 DIAGNOSIS — G894 Chronic pain syndrome: Secondary | ICD-10-CM | POA: Diagnosis not present

## 2023-04-23 DIAGNOSIS — I700 Atherosclerosis of aorta: Secondary | ICD-10-CM | POA: Diagnosis not present

## 2023-04-23 DIAGNOSIS — E1122 Type 2 diabetes mellitus with diabetic chronic kidney disease: Secondary | ICD-10-CM | POA: Diagnosis not present

## 2023-04-23 DIAGNOSIS — E782 Mixed hyperlipidemia: Secondary | ICD-10-CM | POA: Diagnosis not present

## 2023-04-23 DIAGNOSIS — I480 Paroxysmal atrial fibrillation: Secondary | ICD-10-CM | POA: Diagnosis not present

## 2023-04-23 DIAGNOSIS — K219 Gastro-esophageal reflux disease without esophagitis: Secondary | ICD-10-CM | POA: Diagnosis not present

## 2023-04-23 DIAGNOSIS — J449 Chronic obstructive pulmonary disease, unspecified: Secondary | ICD-10-CM | POA: Diagnosis not present

## 2023-04-23 DIAGNOSIS — N184 Chronic kidney disease, stage 4 (severe): Secondary | ICD-10-CM | POA: Diagnosis not present

## 2023-04-23 DIAGNOSIS — E785 Hyperlipidemia, unspecified: Secondary | ICD-10-CM | POA: Diagnosis not present

## 2023-04-23 DIAGNOSIS — E1142 Type 2 diabetes mellitus with diabetic polyneuropathy: Secondary | ICD-10-CM | POA: Diagnosis not present

## 2023-04-23 DIAGNOSIS — I10 Essential (primary) hypertension: Secondary | ICD-10-CM | POA: Diagnosis not present

## 2023-05-28 DIAGNOSIS — N2581 Secondary hyperparathyroidism of renal origin: Secondary | ICD-10-CM | POA: Diagnosis not present

## 2023-05-28 DIAGNOSIS — R809 Proteinuria, unspecified: Secondary | ICD-10-CM | POA: Diagnosis not present

## 2023-05-28 DIAGNOSIS — E1129 Type 2 diabetes mellitus with other diabetic kidney complication: Secondary | ICD-10-CM | POA: Diagnosis not present

## 2023-05-28 DIAGNOSIS — E1122 Type 2 diabetes mellitus with diabetic chronic kidney disease: Secondary | ICD-10-CM | POA: Diagnosis not present

## 2023-06-19 DIAGNOSIS — R300 Dysuria: Secondary | ICD-10-CM | POA: Diagnosis not present

## 2023-06-19 DIAGNOSIS — N184 Chronic kidney disease, stage 4 (severe): Secondary | ICD-10-CM | POA: Diagnosis not present

## 2023-06-19 DIAGNOSIS — R419 Unspecified symptoms and signs involving cognitive functions and awareness: Secondary | ICD-10-CM | POA: Diagnosis not present

## 2023-06-19 DIAGNOSIS — J449 Chronic obstructive pulmonary disease, unspecified: Secondary | ICD-10-CM | POA: Diagnosis not present

## 2023-06-19 DIAGNOSIS — G894 Chronic pain syndrome: Secondary | ICD-10-CM | POA: Diagnosis not present

## 2023-06-19 DIAGNOSIS — K219 Gastro-esophageal reflux disease without esophagitis: Secondary | ICD-10-CM | POA: Diagnosis not present

## 2023-06-19 DIAGNOSIS — I129 Hypertensive chronic kidney disease with stage 1 through stage 4 chronic kidney disease, or unspecified chronic kidney disease: Secondary | ICD-10-CM | POA: Diagnosis not present

## 2023-06-19 DIAGNOSIS — F419 Anxiety disorder, unspecified: Secondary | ICD-10-CM | POA: Diagnosis not present

## 2023-06-19 DIAGNOSIS — G3184 Mild cognitive impairment, so stated: Secondary | ICD-10-CM | POA: Diagnosis not present

## 2023-06-19 DIAGNOSIS — R600 Localized edema: Secondary | ICD-10-CM | POA: Diagnosis not present

## 2023-06-19 DIAGNOSIS — Z87440 Personal history of urinary (tract) infections: Secondary | ICD-10-CM | POA: Diagnosis not present

## 2023-06-19 DIAGNOSIS — M6281 Muscle weakness (generalized): Secondary | ICD-10-CM | POA: Diagnosis not present

## 2023-07-05 ENCOUNTER — Other Ambulatory Visit: Payer: Self-pay | Admitting: Gastroenterology

## 2023-07-05 DIAGNOSIS — K838 Other specified diseases of biliary tract: Secondary | ICD-10-CM

## 2023-07-08 NOTE — Telephone Encounter (Signed)
Sending in refills. Patient needs routine follow-up. Please arrange.

## 2023-07-10 NOTE — Telephone Encounter (Signed)
We can do a virtual visit for her for now.

## 2023-07-19 ENCOUNTER — Ambulatory Visit: Payer: Medicare Other | Admitting: Cardiology

## 2023-08-01 DIAGNOSIS — M159 Polyosteoarthritis, unspecified: Secondary | ICD-10-CM | POA: Diagnosis not present

## 2023-08-01 DIAGNOSIS — E1122 Type 2 diabetes mellitus with diabetic chronic kidney disease: Secondary | ICD-10-CM | POA: Diagnosis not present

## 2023-08-01 DIAGNOSIS — K219 Gastro-esophageal reflux disease without esophagitis: Secondary | ICD-10-CM | POA: Diagnosis not present

## 2023-08-01 DIAGNOSIS — E785 Hyperlipidemia, unspecified: Secondary | ICD-10-CM | POA: Diagnosis not present

## 2023-08-01 DIAGNOSIS — I10 Essential (primary) hypertension: Secondary | ICD-10-CM | POA: Diagnosis not present

## 2023-08-01 DIAGNOSIS — I4891 Unspecified atrial fibrillation: Secondary | ICD-10-CM | POA: Diagnosis not present

## 2023-08-01 DIAGNOSIS — K5900 Constipation, unspecified: Secondary | ICD-10-CM | POA: Diagnosis not present

## 2023-08-01 DIAGNOSIS — E43 Unspecified severe protein-calorie malnutrition: Secondary | ICD-10-CM | POA: Diagnosis not present

## 2023-08-01 DIAGNOSIS — H548 Legal blindness, as defined in USA: Secondary | ICD-10-CM | POA: Diagnosis not present

## 2023-08-01 DIAGNOSIS — F03A0 Unspecified dementia, mild, without behavioral disturbance, psychotic disturbance, mood disturbance, and anxiety: Secondary | ICD-10-CM | POA: Diagnosis not present

## 2023-08-02 DIAGNOSIS — I4891 Unspecified atrial fibrillation: Secondary | ICD-10-CM | POA: Diagnosis not present

## 2023-08-02 DIAGNOSIS — F03A0 Unspecified dementia, mild, without behavioral disturbance, psychotic disturbance, mood disturbance, and anxiety: Secondary | ICD-10-CM | POA: Diagnosis not present

## 2023-08-02 DIAGNOSIS — M159 Polyosteoarthritis, unspecified: Secondary | ICD-10-CM | POA: Diagnosis not present

## 2023-08-02 DIAGNOSIS — E43 Unspecified severe protein-calorie malnutrition: Secondary | ICD-10-CM | POA: Diagnosis not present

## 2023-08-02 DIAGNOSIS — K5900 Constipation, unspecified: Secondary | ICD-10-CM | POA: Diagnosis not present

## 2023-08-02 DIAGNOSIS — I10 Essential (primary) hypertension: Secondary | ICD-10-CM | POA: Diagnosis not present

## 2023-08-05 DIAGNOSIS — E43 Unspecified severe protein-calorie malnutrition: Secondary | ICD-10-CM | POA: Diagnosis not present

## 2023-08-05 DIAGNOSIS — F03A0 Unspecified dementia, mild, without behavioral disturbance, psychotic disturbance, mood disturbance, and anxiety: Secondary | ICD-10-CM | POA: Diagnosis not present

## 2023-08-05 DIAGNOSIS — I4891 Unspecified atrial fibrillation: Secondary | ICD-10-CM | POA: Diagnosis not present

## 2023-08-05 DIAGNOSIS — M159 Polyosteoarthritis, unspecified: Secondary | ICD-10-CM | POA: Diagnosis not present

## 2023-08-05 DIAGNOSIS — I10 Essential (primary) hypertension: Secondary | ICD-10-CM | POA: Diagnosis not present

## 2023-08-05 DIAGNOSIS — K5900 Constipation, unspecified: Secondary | ICD-10-CM | POA: Diagnosis not present

## 2023-08-06 DIAGNOSIS — K5900 Constipation, unspecified: Secondary | ICD-10-CM | POA: Diagnosis not present

## 2023-08-06 DIAGNOSIS — I4891 Unspecified atrial fibrillation: Secondary | ICD-10-CM | POA: Diagnosis not present

## 2023-08-06 DIAGNOSIS — M159 Polyosteoarthritis, unspecified: Secondary | ICD-10-CM | POA: Diagnosis not present

## 2023-08-06 DIAGNOSIS — E43 Unspecified severe protein-calorie malnutrition: Secondary | ICD-10-CM | POA: Diagnosis not present

## 2023-08-06 DIAGNOSIS — F03A0 Unspecified dementia, mild, without behavioral disturbance, psychotic disturbance, mood disturbance, and anxiety: Secondary | ICD-10-CM | POA: Diagnosis not present

## 2023-08-06 DIAGNOSIS — I10 Essential (primary) hypertension: Secondary | ICD-10-CM | POA: Diagnosis not present

## 2023-08-08 DIAGNOSIS — I4891 Unspecified atrial fibrillation: Secondary | ICD-10-CM | POA: Diagnosis not present

## 2023-08-08 DIAGNOSIS — I10 Essential (primary) hypertension: Secondary | ICD-10-CM | POA: Diagnosis not present

## 2023-08-08 DIAGNOSIS — K5900 Constipation, unspecified: Secondary | ICD-10-CM | POA: Diagnosis not present

## 2023-08-08 DIAGNOSIS — M159 Polyosteoarthritis, unspecified: Secondary | ICD-10-CM | POA: Diagnosis not present

## 2023-08-08 DIAGNOSIS — E43 Unspecified severe protein-calorie malnutrition: Secondary | ICD-10-CM | POA: Diagnosis not present

## 2023-08-08 DIAGNOSIS — F03A0 Unspecified dementia, mild, without behavioral disturbance, psychotic disturbance, mood disturbance, and anxiety: Secondary | ICD-10-CM | POA: Diagnosis not present

## 2023-08-13 DIAGNOSIS — M159 Polyosteoarthritis, unspecified: Secondary | ICD-10-CM | POA: Diagnosis not present

## 2023-08-13 DIAGNOSIS — I10 Essential (primary) hypertension: Secondary | ICD-10-CM | POA: Diagnosis not present

## 2023-08-13 DIAGNOSIS — K5900 Constipation, unspecified: Secondary | ICD-10-CM | POA: Diagnosis not present

## 2023-08-13 DIAGNOSIS — E43 Unspecified severe protein-calorie malnutrition: Secondary | ICD-10-CM | POA: Diagnosis not present

## 2023-08-13 DIAGNOSIS — F03A0 Unspecified dementia, mild, without behavioral disturbance, psychotic disturbance, mood disturbance, and anxiety: Secondary | ICD-10-CM | POA: Diagnosis not present

## 2023-08-13 DIAGNOSIS — I4891 Unspecified atrial fibrillation: Secondary | ICD-10-CM | POA: Diagnosis not present

## 2023-08-15 DIAGNOSIS — M159 Polyosteoarthritis, unspecified: Secondary | ICD-10-CM | POA: Diagnosis not present

## 2023-08-15 DIAGNOSIS — E43 Unspecified severe protein-calorie malnutrition: Secondary | ICD-10-CM | POA: Diagnosis not present

## 2023-08-15 DIAGNOSIS — I10 Essential (primary) hypertension: Secondary | ICD-10-CM | POA: Diagnosis not present

## 2023-08-15 DIAGNOSIS — F03A0 Unspecified dementia, mild, without behavioral disturbance, psychotic disturbance, mood disturbance, and anxiety: Secondary | ICD-10-CM | POA: Diagnosis not present

## 2023-08-15 DIAGNOSIS — K5900 Constipation, unspecified: Secondary | ICD-10-CM | POA: Diagnosis not present

## 2023-08-15 DIAGNOSIS — I4891 Unspecified atrial fibrillation: Secondary | ICD-10-CM | POA: Diagnosis not present

## 2023-08-22 DIAGNOSIS — E43 Unspecified severe protein-calorie malnutrition: Secondary | ICD-10-CM | POA: Diagnosis not present

## 2023-08-22 DIAGNOSIS — I10 Essential (primary) hypertension: Secondary | ICD-10-CM | POA: Diagnosis not present

## 2023-08-22 DIAGNOSIS — I4891 Unspecified atrial fibrillation: Secondary | ICD-10-CM | POA: Diagnosis not present

## 2023-08-22 DIAGNOSIS — K5900 Constipation, unspecified: Secondary | ICD-10-CM | POA: Diagnosis not present

## 2023-08-22 DIAGNOSIS — M159 Polyosteoarthritis, unspecified: Secondary | ICD-10-CM | POA: Diagnosis not present

## 2023-08-22 DIAGNOSIS — F03A0 Unspecified dementia, mild, without behavioral disturbance, psychotic disturbance, mood disturbance, and anxiety: Secondary | ICD-10-CM | POA: Diagnosis not present

## 2023-08-27 DIAGNOSIS — M159 Polyosteoarthritis, unspecified: Secondary | ICD-10-CM | POA: Diagnosis not present

## 2023-08-27 DIAGNOSIS — I10 Essential (primary) hypertension: Secondary | ICD-10-CM | POA: Diagnosis not present

## 2023-08-27 DIAGNOSIS — E43 Unspecified severe protein-calorie malnutrition: Secondary | ICD-10-CM | POA: Diagnosis not present

## 2023-08-27 DIAGNOSIS — K5900 Constipation, unspecified: Secondary | ICD-10-CM | POA: Diagnosis not present

## 2023-08-27 DIAGNOSIS — F03A0 Unspecified dementia, mild, without behavioral disturbance, psychotic disturbance, mood disturbance, and anxiety: Secondary | ICD-10-CM | POA: Diagnosis not present

## 2023-08-27 DIAGNOSIS — I4891 Unspecified atrial fibrillation: Secondary | ICD-10-CM | POA: Diagnosis not present

## 2023-08-28 DIAGNOSIS — M159 Polyosteoarthritis, unspecified: Secondary | ICD-10-CM | POA: Diagnosis not present

## 2023-08-28 DIAGNOSIS — E1122 Type 2 diabetes mellitus with diabetic chronic kidney disease: Secondary | ICD-10-CM | POA: Diagnosis not present

## 2023-08-28 DIAGNOSIS — I4891 Unspecified atrial fibrillation: Secondary | ICD-10-CM | POA: Diagnosis not present

## 2023-08-28 DIAGNOSIS — I10 Essential (primary) hypertension: Secondary | ICD-10-CM | POA: Diagnosis not present

## 2023-08-28 DIAGNOSIS — E43 Unspecified severe protein-calorie malnutrition: Secondary | ICD-10-CM | POA: Diagnosis not present

## 2023-08-28 DIAGNOSIS — E785 Hyperlipidemia, unspecified: Secondary | ICD-10-CM | POA: Diagnosis not present

## 2023-08-28 DIAGNOSIS — F03A0 Unspecified dementia, mild, without behavioral disturbance, psychotic disturbance, mood disturbance, and anxiety: Secondary | ICD-10-CM | POA: Diagnosis not present

## 2023-08-28 DIAGNOSIS — H548 Legal blindness, as defined in USA: Secondary | ICD-10-CM | POA: Diagnosis not present

## 2023-08-28 DIAGNOSIS — K219 Gastro-esophageal reflux disease without esophagitis: Secondary | ICD-10-CM | POA: Diagnosis not present

## 2023-08-28 DIAGNOSIS — K5900 Constipation, unspecified: Secondary | ICD-10-CM | POA: Diagnosis not present

## 2023-08-29 DIAGNOSIS — M159 Polyosteoarthritis, unspecified: Secondary | ICD-10-CM | POA: Diagnosis not present

## 2023-08-29 DIAGNOSIS — I10 Essential (primary) hypertension: Secondary | ICD-10-CM | POA: Diagnosis not present

## 2023-08-29 DIAGNOSIS — F03A0 Unspecified dementia, mild, without behavioral disturbance, psychotic disturbance, mood disturbance, and anxiety: Secondary | ICD-10-CM | POA: Diagnosis not present

## 2023-08-29 DIAGNOSIS — E43 Unspecified severe protein-calorie malnutrition: Secondary | ICD-10-CM | POA: Diagnosis not present

## 2023-08-29 DIAGNOSIS — I4891 Unspecified atrial fibrillation: Secondary | ICD-10-CM | POA: Diagnosis not present

## 2023-08-29 DIAGNOSIS — K5900 Constipation, unspecified: Secondary | ICD-10-CM | POA: Diagnosis not present

## 2023-09-03 DIAGNOSIS — E43 Unspecified severe protein-calorie malnutrition: Secondary | ICD-10-CM | POA: Diagnosis not present

## 2023-09-03 DIAGNOSIS — F03A0 Unspecified dementia, mild, without behavioral disturbance, psychotic disturbance, mood disturbance, and anxiety: Secondary | ICD-10-CM | POA: Diagnosis not present

## 2023-09-03 DIAGNOSIS — K5900 Constipation, unspecified: Secondary | ICD-10-CM | POA: Diagnosis not present

## 2023-09-03 DIAGNOSIS — I4891 Unspecified atrial fibrillation: Secondary | ICD-10-CM | POA: Diagnosis not present

## 2023-09-03 DIAGNOSIS — I10 Essential (primary) hypertension: Secondary | ICD-10-CM | POA: Diagnosis not present

## 2023-09-03 DIAGNOSIS — M159 Polyosteoarthritis, unspecified: Secondary | ICD-10-CM | POA: Diagnosis not present

## 2023-09-05 DIAGNOSIS — I10 Essential (primary) hypertension: Secondary | ICD-10-CM | POA: Diagnosis not present

## 2023-09-05 DIAGNOSIS — M159 Polyosteoarthritis, unspecified: Secondary | ICD-10-CM | POA: Diagnosis not present

## 2023-09-05 DIAGNOSIS — F03A0 Unspecified dementia, mild, without behavioral disturbance, psychotic disturbance, mood disturbance, and anxiety: Secondary | ICD-10-CM | POA: Diagnosis not present

## 2023-09-05 DIAGNOSIS — I4891 Unspecified atrial fibrillation: Secondary | ICD-10-CM | POA: Diagnosis not present

## 2023-09-05 DIAGNOSIS — K5900 Constipation, unspecified: Secondary | ICD-10-CM | POA: Diagnosis not present

## 2023-09-05 DIAGNOSIS — E43 Unspecified severe protein-calorie malnutrition: Secondary | ICD-10-CM | POA: Diagnosis not present

## 2023-09-12 DIAGNOSIS — I10 Essential (primary) hypertension: Secondary | ICD-10-CM | POA: Diagnosis not present

## 2023-09-12 DIAGNOSIS — E43 Unspecified severe protein-calorie malnutrition: Secondary | ICD-10-CM | POA: Diagnosis not present

## 2023-09-12 DIAGNOSIS — M159 Polyosteoarthritis, unspecified: Secondary | ICD-10-CM | POA: Diagnosis not present

## 2023-09-12 DIAGNOSIS — I4891 Unspecified atrial fibrillation: Secondary | ICD-10-CM | POA: Diagnosis not present

## 2023-09-12 DIAGNOSIS — K5900 Constipation, unspecified: Secondary | ICD-10-CM | POA: Diagnosis not present

## 2023-09-12 DIAGNOSIS — F03A0 Unspecified dementia, mild, without behavioral disturbance, psychotic disturbance, mood disturbance, and anxiety: Secondary | ICD-10-CM | POA: Diagnosis not present

## 2023-09-17 DIAGNOSIS — K5900 Constipation, unspecified: Secondary | ICD-10-CM | POA: Diagnosis not present

## 2023-09-17 DIAGNOSIS — E43 Unspecified severe protein-calorie malnutrition: Secondary | ICD-10-CM | POA: Diagnosis not present

## 2023-09-17 DIAGNOSIS — I10 Essential (primary) hypertension: Secondary | ICD-10-CM | POA: Diagnosis not present

## 2023-09-17 DIAGNOSIS — I4891 Unspecified atrial fibrillation: Secondary | ICD-10-CM | POA: Diagnosis not present

## 2023-09-17 DIAGNOSIS — F03A0 Unspecified dementia, mild, without behavioral disturbance, psychotic disturbance, mood disturbance, and anxiety: Secondary | ICD-10-CM | POA: Diagnosis not present

## 2023-09-17 DIAGNOSIS — M159 Polyosteoarthritis, unspecified: Secondary | ICD-10-CM | POA: Diagnosis not present

## 2023-09-19 DIAGNOSIS — M159 Polyosteoarthritis, unspecified: Secondary | ICD-10-CM | POA: Diagnosis not present

## 2023-09-19 DIAGNOSIS — F03A0 Unspecified dementia, mild, without behavioral disturbance, psychotic disturbance, mood disturbance, and anxiety: Secondary | ICD-10-CM | POA: Diagnosis not present

## 2023-09-19 DIAGNOSIS — I10 Essential (primary) hypertension: Secondary | ICD-10-CM | POA: Diagnosis not present

## 2023-09-19 DIAGNOSIS — E43 Unspecified severe protein-calorie malnutrition: Secondary | ICD-10-CM | POA: Diagnosis not present

## 2023-09-19 DIAGNOSIS — I4891 Unspecified atrial fibrillation: Secondary | ICD-10-CM | POA: Diagnosis not present

## 2023-09-19 DIAGNOSIS — K5900 Constipation, unspecified: Secondary | ICD-10-CM | POA: Diagnosis not present

## 2023-09-26 DIAGNOSIS — I10 Essential (primary) hypertension: Secondary | ICD-10-CM | POA: Diagnosis not present

## 2023-09-26 DIAGNOSIS — M159 Polyosteoarthritis, unspecified: Secondary | ICD-10-CM | POA: Diagnosis not present

## 2023-09-26 DIAGNOSIS — I4891 Unspecified atrial fibrillation: Secondary | ICD-10-CM | POA: Diagnosis not present

## 2023-09-26 DIAGNOSIS — F03A0 Unspecified dementia, mild, without behavioral disturbance, psychotic disturbance, mood disturbance, and anxiety: Secondary | ICD-10-CM | POA: Diagnosis not present

## 2023-09-26 DIAGNOSIS — E43 Unspecified severe protein-calorie malnutrition: Secondary | ICD-10-CM | POA: Diagnosis not present

## 2023-09-26 DIAGNOSIS — K5900 Constipation, unspecified: Secondary | ICD-10-CM | POA: Diagnosis not present

## 2023-09-28 DIAGNOSIS — K5900 Constipation, unspecified: Secondary | ICD-10-CM | POA: Diagnosis not present

## 2023-09-28 DIAGNOSIS — I4891 Unspecified atrial fibrillation: Secondary | ICD-10-CM | POA: Diagnosis not present

## 2023-09-28 DIAGNOSIS — M159 Polyosteoarthritis, unspecified: Secondary | ICD-10-CM | POA: Diagnosis not present

## 2023-09-28 DIAGNOSIS — F03A0 Unspecified dementia, mild, without behavioral disturbance, psychotic disturbance, mood disturbance, and anxiety: Secondary | ICD-10-CM | POA: Diagnosis not present

## 2023-09-28 DIAGNOSIS — H548 Legal blindness, as defined in USA: Secondary | ICD-10-CM | POA: Diagnosis not present

## 2023-09-28 DIAGNOSIS — E43 Unspecified severe protein-calorie malnutrition: Secondary | ICD-10-CM | POA: Diagnosis not present

## 2023-09-28 DIAGNOSIS — E1122 Type 2 diabetes mellitus with diabetic chronic kidney disease: Secondary | ICD-10-CM | POA: Diagnosis not present

## 2023-09-28 DIAGNOSIS — I10 Essential (primary) hypertension: Secondary | ICD-10-CM | POA: Diagnosis not present

## 2023-09-28 DIAGNOSIS — E785 Hyperlipidemia, unspecified: Secondary | ICD-10-CM | POA: Diagnosis not present

## 2023-09-28 DIAGNOSIS — K219 Gastro-esophageal reflux disease without esophagitis: Secondary | ICD-10-CM | POA: Diagnosis not present

## 2023-10-01 DIAGNOSIS — E43 Unspecified severe protein-calorie malnutrition: Secondary | ICD-10-CM | POA: Diagnosis not present

## 2023-10-01 DIAGNOSIS — I4891 Unspecified atrial fibrillation: Secondary | ICD-10-CM | POA: Diagnosis not present

## 2023-10-01 DIAGNOSIS — M159 Polyosteoarthritis, unspecified: Secondary | ICD-10-CM | POA: Diagnosis not present

## 2023-10-01 DIAGNOSIS — F03A0 Unspecified dementia, mild, without behavioral disturbance, psychotic disturbance, mood disturbance, and anxiety: Secondary | ICD-10-CM | POA: Diagnosis not present

## 2023-10-01 DIAGNOSIS — K5900 Constipation, unspecified: Secondary | ICD-10-CM | POA: Diagnosis not present

## 2023-10-01 DIAGNOSIS — I10 Essential (primary) hypertension: Secondary | ICD-10-CM | POA: Diagnosis not present

## 2023-10-02 DIAGNOSIS — F03A0 Unspecified dementia, mild, without behavioral disturbance, psychotic disturbance, mood disturbance, and anxiety: Secondary | ICD-10-CM | POA: Diagnosis not present

## 2023-10-02 DIAGNOSIS — K5900 Constipation, unspecified: Secondary | ICD-10-CM | POA: Diagnosis not present

## 2023-10-02 DIAGNOSIS — M159 Polyosteoarthritis, unspecified: Secondary | ICD-10-CM | POA: Diagnosis not present

## 2023-10-02 DIAGNOSIS — I10 Essential (primary) hypertension: Secondary | ICD-10-CM | POA: Diagnosis not present

## 2023-10-02 DIAGNOSIS — I4891 Unspecified atrial fibrillation: Secondary | ICD-10-CM | POA: Diagnosis not present

## 2023-10-02 DIAGNOSIS — E43 Unspecified severe protein-calorie malnutrition: Secondary | ICD-10-CM | POA: Diagnosis not present

## 2023-10-03 DIAGNOSIS — M159 Polyosteoarthritis, unspecified: Secondary | ICD-10-CM | POA: Diagnosis not present

## 2023-10-03 DIAGNOSIS — E43 Unspecified severe protein-calorie malnutrition: Secondary | ICD-10-CM | POA: Diagnosis not present

## 2023-10-03 DIAGNOSIS — F03A0 Unspecified dementia, mild, without behavioral disturbance, psychotic disturbance, mood disturbance, and anxiety: Secondary | ICD-10-CM | POA: Diagnosis not present

## 2023-10-03 DIAGNOSIS — I10 Essential (primary) hypertension: Secondary | ICD-10-CM | POA: Diagnosis not present

## 2023-10-03 DIAGNOSIS — K5900 Constipation, unspecified: Secondary | ICD-10-CM | POA: Diagnosis not present

## 2023-10-03 DIAGNOSIS — I4891 Unspecified atrial fibrillation: Secondary | ICD-10-CM | POA: Diagnosis not present

## 2023-10-10 DIAGNOSIS — E43 Unspecified severe protein-calorie malnutrition: Secondary | ICD-10-CM | POA: Diagnosis not present

## 2023-10-10 DIAGNOSIS — I4891 Unspecified atrial fibrillation: Secondary | ICD-10-CM | POA: Diagnosis not present

## 2023-10-10 DIAGNOSIS — K5900 Constipation, unspecified: Secondary | ICD-10-CM | POA: Diagnosis not present

## 2023-10-10 DIAGNOSIS — F03A0 Unspecified dementia, mild, without behavioral disturbance, psychotic disturbance, mood disturbance, and anxiety: Secondary | ICD-10-CM | POA: Diagnosis not present

## 2023-10-10 DIAGNOSIS — I10 Essential (primary) hypertension: Secondary | ICD-10-CM | POA: Diagnosis not present

## 2023-10-10 DIAGNOSIS — M159 Polyosteoarthritis, unspecified: Secondary | ICD-10-CM | POA: Diagnosis not present

## 2023-10-15 DIAGNOSIS — E43 Unspecified severe protein-calorie malnutrition: Secondary | ICD-10-CM | POA: Diagnosis not present

## 2023-10-15 DIAGNOSIS — I10 Essential (primary) hypertension: Secondary | ICD-10-CM | POA: Diagnosis not present

## 2023-10-15 DIAGNOSIS — I4891 Unspecified atrial fibrillation: Secondary | ICD-10-CM | POA: Diagnosis not present

## 2023-10-15 DIAGNOSIS — M159 Polyosteoarthritis, unspecified: Secondary | ICD-10-CM | POA: Diagnosis not present

## 2023-10-15 DIAGNOSIS — F03A0 Unspecified dementia, mild, without behavioral disturbance, psychotic disturbance, mood disturbance, and anxiety: Secondary | ICD-10-CM | POA: Diagnosis not present

## 2023-10-15 DIAGNOSIS — K5900 Constipation, unspecified: Secondary | ICD-10-CM | POA: Diagnosis not present

## 2023-10-22 DIAGNOSIS — F03A0 Unspecified dementia, mild, without behavioral disturbance, psychotic disturbance, mood disturbance, and anxiety: Secondary | ICD-10-CM | POA: Diagnosis not present

## 2023-10-22 DIAGNOSIS — I4891 Unspecified atrial fibrillation: Secondary | ICD-10-CM | POA: Diagnosis not present

## 2023-10-22 DIAGNOSIS — K5900 Constipation, unspecified: Secondary | ICD-10-CM | POA: Diagnosis not present

## 2023-10-22 DIAGNOSIS — I10 Essential (primary) hypertension: Secondary | ICD-10-CM | POA: Diagnosis not present

## 2023-10-22 DIAGNOSIS — E43 Unspecified severe protein-calorie malnutrition: Secondary | ICD-10-CM | POA: Diagnosis not present

## 2023-10-22 DIAGNOSIS — M159 Polyosteoarthritis, unspecified: Secondary | ICD-10-CM | POA: Diagnosis not present

## 2023-10-24 DIAGNOSIS — M159 Polyosteoarthritis, unspecified: Secondary | ICD-10-CM | POA: Diagnosis not present

## 2023-10-24 DIAGNOSIS — I10 Essential (primary) hypertension: Secondary | ICD-10-CM | POA: Diagnosis not present

## 2023-10-24 DIAGNOSIS — F03A0 Unspecified dementia, mild, without behavioral disturbance, psychotic disturbance, mood disturbance, and anxiety: Secondary | ICD-10-CM | POA: Diagnosis not present

## 2023-10-24 DIAGNOSIS — K5900 Constipation, unspecified: Secondary | ICD-10-CM | POA: Diagnosis not present

## 2023-10-24 DIAGNOSIS — E43 Unspecified severe protein-calorie malnutrition: Secondary | ICD-10-CM | POA: Diagnosis not present

## 2023-10-24 DIAGNOSIS — I4891 Unspecified atrial fibrillation: Secondary | ICD-10-CM | POA: Diagnosis not present

## 2023-10-26 DIAGNOSIS — E1122 Type 2 diabetes mellitus with diabetic chronic kidney disease: Secondary | ICD-10-CM | POA: Diagnosis not present

## 2023-10-26 DIAGNOSIS — H548 Legal blindness, as defined in USA: Secondary | ICD-10-CM | POA: Diagnosis not present

## 2023-10-26 DIAGNOSIS — I10 Essential (primary) hypertension: Secondary | ICD-10-CM | POA: Diagnosis not present

## 2023-10-26 DIAGNOSIS — M159 Polyosteoarthritis, unspecified: Secondary | ICD-10-CM | POA: Diagnosis not present

## 2023-10-26 DIAGNOSIS — G311 Senile degeneration of brain, not elsewhere classified: Secondary | ICD-10-CM | POA: Diagnosis not present

## 2023-10-26 DIAGNOSIS — E785 Hyperlipidemia, unspecified: Secondary | ICD-10-CM | POA: Diagnosis not present

## 2023-10-26 DIAGNOSIS — F03A0 Unspecified dementia, mild, without behavioral disturbance, psychotic disturbance, mood disturbance, and anxiety: Secondary | ICD-10-CM | POA: Diagnosis not present

## 2023-10-26 DIAGNOSIS — I4891 Unspecified atrial fibrillation: Secondary | ICD-10-CM | POA: Diagnosis not present

## 2023-10-26 DIAGNOSIS — K5900 Constipation, unspecified: Secondary | ICD-10-CM | POA: Diagnosis not present

## 2023-10-26 DIAGNOSIS — K219 Gastro-esophageal reflux disease without esophagitis: Secondary | ICD-10-CM | POA: Diagnosis not present

## 2023-10-31 DIAGNOSIS — K5900 Constipation, unspecified: Secondary | ICD-10-CM | POA: Diagnosis not present

## 2023-10-31 DIAGNOSIS — F03A0 Unspecified dementia, mild, without behavioral disturbance, psychotic disturbance, mood disturbance, and anxiety: Secondary | ICD-10-CM | POA: Diagnosis not present

## 2023-10-31 DIAGNOSIS — G311 Senile degeneration of brain, not elsewhere classified: Secondary | ICD-10-CM | POA: Diagnosis not present

## 2023-10-31 DIAGNOSIS — I4891 Unspecified atrial fibrillation: Secondary | ICD-10-CM | POA: Diagnosis not present

## 2023-10-31 DIAGNOSIS — M159 Polyosteoarthritis, unspecified: Secondary | ICD-10-CM | POA: Diagnosis not present

## 2023-10-31 DIAGNOSIS — I10 Essential (primary) hypertension: Secondary | ICD-10-CM | POA: Diagnosis not present

## 2023-11-06 DIAGNOSIS — K5900 Constipation, unspecified: Secondary | ICD-10-CM | POA: Diagnosis not present

## 2023-11-06 DIAGNOSIS — F03A0 Unspecified dementia, mild, without behavioral disturbance, psychotic disturbance, mood disturbance, and anxiety: Secondary | ICD-10-CM | POA: Diagnosis not present

## 2023-11-06 DIAGNOSIS — I10 Essential (primary) hypertension: Secondary | ICD-10-CM | POA: Diagnosis not present

## 2023-11-06 DIAGNOSIS — I4891 Unspecified atrial fibrillation: Secondary | ICD-10-CM | POA: Diagnosis not present

## 2023-11-06 DIAGNOSIS — M159 Polyosteoarthritis, unspecified: Secondary | ICD-10-CM | POA: Diagnosis not present

## 2023-11-06 DIAGNOSIS — G311 Senile degeneration of brain, not elsewhere classified: Secondary | ICD-10-CM | POA: Diagnosis not present

## 2023-11-07 DIAGNOSIS — G311 Senile degeneration of brain, not elsewhere classified: Secondary | ICD-10-CM | POA: Diagnosis not present

## 2023-11-07 DIAGNOSIS — M159 Polyosteoarthritis, unspecified: Secondary | ICD-10-CM | POA: Diagnosis not present

## 2023-11-07 DIAGNOSIS — I4891 Unspecified atrial fibrillation: Secondary | ICD-10-CM | POA: Diagnosis not present

## 2023-11-07 DIAGNOSIS — F03A0 Unspecified dementia, mild, without behavioral disturbance, psychotic disturbance, mood disturbance, and anxiety: Secondary | ICD-10-CM | POA: Diagnosis not present

## 2023-11-07 DIAGNOSIS — K5900 Constipation, unspecified: Secondary | ICD-10-CM | POA: Diagnosis not present

## 2023-11-07 DIAGNOSIS — I10 Essential (primary) hypertension: Secondary | ICD-10-CM | POA: Diagnosis not present

## 2023-11-12 DIAGNOSIS — F03A0 Unspecified dementia, mild, without behavioral disturbance, psychotic disturbance, mood disturbance, and anxiety: Secondary | ICD-10-CM | POA: Diagnosis not present

## 2023-11-12 DIAGNOSIS — I10 Essential (primary) hypertension: Secondary | ICD-10-CM | POA: Diagnosis not present

## 2023-11-12 DIAGNOSIS — G311 Senile degeneration of brain, not elsewhere classified: Secondary | ICD-10-CM | POA: Diagnosis not present

## 2023-11-12 DIAGNOSIS — I4891 Unspecified atrial fibrillation: Secondary | ICD-10-CM | POA: Diagnosis not present

## 2023-11-12 DIAGNOSIS — K5900 Constipation, unspecified: Secondary | ICD-10-CM | POA: Diagnosis not present

## 2023-11-12 DIAGNOSIS — M159 Polyosteoarthritis, unspecified: Secondary | ICD-10-CM | POA: Diagnosis not present

## 2023-11-14 DIAGNOSIS — K5900 Constipation, unspecified: Secondary | ICD-10-CM | POA: Diagnosis not present

## 2023-11-14 DIAGNOSIS — I4891 Unspecified atrial fibrillation: Secondary | ICD-10-CM | POA: Diagnosis not present

## 2023-11-14 DIAGNOSIS — I10 Essential (primary) hypertension: Secondary | ICD-10-CM | POA: Diagnosis not present

## 2023-11-14 DIAGNOSIS — M159 Polyosteoarthritis, unspecified: Secondary | ICD-10-CM | POA: Diagnosis not present

## 2023-11-14 DIAGNOSIS — G311 Senile degeneration of brain, not elsewhere classified: Secondary | ICD-10-CM | POA: Diagnosis not present

## 2023-11-14 DIAGNOSIS — F03A0 Unspecified dementia, mild, without behavioral disturbance, psychotic disturbance, mood disturbance, and anxiety: Secondary | ICD-10-CM | POA: Diagnosis not present

## 2023-11-18 DIAGNOSIS — M159 Polyosteoarthritis, unspecified: Secondary | ICD-10-CM | POA: Diagnosis not present

## 2023-11-18 DIAGNOSIS — G311 Senile degeneration of brain, not elsewhere classified: Secondary | ICD-10-CM | POA: Diagnosis not present

## 2023-11-18 DIAGNOSIS — I4891 Unspecified atrial fibrillation: Secondary | ICD-10-CM | POA: Diagnosis not present

## 2023-11-18 DIAGNOSIS — K5900 Constipation, unspecified: Secondary | ICD-10-CM | POA: Diagnosis not present

## 2023-11-18 DIAGNOSIS — F03A0 Unspecified dementia, mild, without behavioral disturbance, psychotic disturbance, mood disturbance, and anxiety: Secondary | ICD-10-CM | POA: Diagnosis not present

## 2023-11-18 DIAGNOSIS — I10 Essential (primary) hypertension: Secondary | ICD-10-CM | POA: Diagnosis not present

## 2023-11-19 DIAGNOSIS — F03A0 Unspecified dementia, mild, without behavioral disturbance, psychotic disturbance, mood disturbance, and anxiety: Secondary | ICD-10-CM | POA: Diagnosis not present

## 2023-11-19 DIAGNOSIS — G311 Senile degeneration of brain, not elsewhere classified: Secondary | ICD-10-CM | POA: Diagnosis not present

## 2023-11-19 DIAGNOSIS — K5900 Constipation, unspecified: Secondary | ICD-10-CM | POA: Diagnosis not present

## 2023-11-19 DIAGNOSIS — I10 Essential (primary) hypertension: Secondary | ICD-10-CM | POA: Diagnosis not present

## 2023-11-19 DIAGNOSIS — I4891 Unspecified atrial fibrillation: Secondary | ICD-10-CM | POA: Diagnosis not present

## 2023-11-19 DIAGNOSIS — M159 Polyosteoarthritis, unspecified: Secondary | ICD-10-CM | POA: Diagnosis not present

## 2023-11-21 DIAGNOSIS — G311 Senile degeneration of brain, not elsewhere classified: Secondary | ICD-10-CM | POA: Diagnosis not present

## 2023-11-21 DIAGNOSIS — K5900 Constipation, unspecified: Secondary | ICD-10-CM | POA: Diagnosis not present

## 2023-11-21 DIAGNOSIS — M159 Polyosteoarthritis, unspecified: Secondary | ICD-10-CM | POA: Diagnosis not present

## 2023-11-21 DIAGNOSIS — I4891 Unspecified atrial fibrillation: Secondary | ICD-10-CM | POA: Diagnosis not present

## 2023-11-21 DIAGNOSIS — I10 Essential (primary) hypertension: Secondary | ICD-10-CM | POA: Diagnosis not present

## 2023-11-21 DIAGNOSIS — F03A0 Unspecified dementia, mild, without behavioral disturbance, psychotic disturbance, mood disturbance, and anxiety: Secondary | ICD-10-CM | POA: Diagnosis not present

## 2023-11-26 DIAGNOSIS — I10 Essential (primary) hypertension: Secondary | ICD-10-CM | POA: Diagnosis not present

## 2023-11-26 DIAGNOSIS — M159 Polyosteoarthritis, unspecified: Secondary | ICD-10-CM | POA: Diagnosis not present

## 2023-11-26 DIAGNOSIS — E1122 Type 2 diabetes mellitus with diabetic chronic kidney disease: Secondary | ICD-10-CM | POA: Diagnosis not present

## 2023-11-26 DIAGNOSIS — K219 Gastro-esophageal reflux disease without esophagitis: Secondary | ICD-10-CM | POA: Diagnosis not present

## 2023-11-26 DIAGNOSIS — K5900 Constipation, unspecified: Secondary | ICD-10-CM | POA: Diagnosis not present

## 2023-11-26 DIAGNOSIS — G311 Senile degeneration of brain, not elsewhere classified: Secondary | ICD-10-CM | POA: Diagnosis not present

## 2023-11-26 DIAGNOSIS — I4891 Unspecified atrial fibrillation: Secondary | ICD-10-CM | POA: Diagnosis not present

## 2023-11-26 DIAGNOSIS — H548 Legal blindness, as defined in USA: Secondary | ICD-10-CM | POA: Diagnosis not present

## 2023-11-26 DIAGNOSIS — F03A0 Unspecified dementia, mild, without behavioral disturbance, psychotic disturbance, mood disturbance, and anxiety: Secondary | ICD-10-CM | POA: Diagnosis not present

## 2023-11-26 DIAGNOSIS — E785 Hyperlipidemia, unspecified: Secondary | ICD-10-CM | POA: Diagnosis not present

## 2023-11-28 DIAGNOSIS — I10 Essential (primary) hypertension: Secondary | ICD-10-CM | POA: Diagnosis not present

## 2023-11-28 DIAGNOSIS — K5900 Constipation, unspecified: Secondary | ICD-10-CM | POA: Diagnosis not present

## 2023-11-28 DIAGNOSIS — F03A0 Unspecified dementia, mild, without behavioral disturbance, psychotic disturbance, mood disturbance, and anxiety: Secondary | ICD-10-CM | POA: Diagnosis not present

## 2023-11-28 DIAGNOSIS — G311 Senile degeneration of brain, not elsewhere classified: Secondary | ICD-10-CM | POA: Diagnosis not present

## 2023-11-28 DIAGNOSIS — M159 Polyosteoarthritis, unspecified: Secondary | ICD-10-CM | POA: Diagnosis not present

## 2023-11-28 DIAGNOSIS — I4891 Unspecified atrial fibrillation: Secondary | ICD-10-CM | POA: Diagnosis not present

## 2023-12-03 DIAGNOSIS — I4891 Unspecified atrial fibrillation: Secondary | ICD-10-CM | POA: Diagnosis not present

## 2023-12-03 DIAGNOSIS — G311 Senile degeneration of brain, not elsewhere classified: Secondary | ICD-10-CM | POA: Diagnosis not present

## 2023-12-03 DIAGNOSIS — F03A0 Unspecified dementia, mild, without behavioral disturbance, psychotic disturbance, mood disturbance, and anxiety: Secondary | ICD-10-CM | POA: Diagnosis not present

## 2023-12-03 DIAGNOSIS — I10 Essential (primary) hypertension: Secondary | ICD-10-CM | POA: Diagnosis not present

## 2023-12-03 DIAGNOSIS — M159 Polyosteoarthritis, unspecified: Secondary | ICD-10-CM | POA: Diagnosis not present

## 2023-12-03 DIAGNOSIS — K5900 Constipation, unspecified: Secondary | ICD-10-CM | POA: Diagnosis not present

## 2023-12-05 DIAGNOSIS — F03A0 Unspecified dementia, mild, without behavioral disturbance, psychotic disturbance, mood disturbance, and anxiety: Secondary | ICD-10-CM | POA: Diagnosis not present

## 2023-12-05 DIAGNOSIS — G311 Senile degeneration of brain, not elsewhere classified: Secondary | ICD-10-CM | POA: Diagnosis not present

## 2023-12-05 DIAGNOSIS — I4891 Unspecified atrial fibrillation: Secondary | ICD-10-CM | POA: Diagnosis not present

## 2023-12-05 DIAGNOSIS — K5900 Constipation, unspecified: Secondary | ICD-10-CM | POA: Diagnosis not present

## 2023-12-05 DIAGNOSIS — I10 Essential (primary) hypertension: Secondary | ICD-10-CM | POA: Diagnosis not present

## 2023-12-05 DIAGNOSIS — M159 Polyosteoarthritis, unspecified: Secondary | ICD-10-CM | POA: Diagnosis not present

## 2023-12-09 DIAGNOSIS — K5900 Constipation, unspecified: Secondary | ICD-10-CM | POA: Diagnosis not present

## 2023-12-09 DIAGNOSIS — G311 Senile degeneration of brain, not elsewhere classified: Secondary | ICD-10-CM | POA: Diagnosis not present

## 2023-12-09 DIAGNOSIS — M159 Polyosteoarthritis, unspecified: Secondary | ICD-10-CM | POA: Diagnosis not present

## 2023-12-09 DIAGNOSIS — F03A0 Unspecified dementia, mild, without behavioral disturbance, psychotic disturbance, mood disturbance, and anxiety: Secondary | ICD-10-CM | POA: Diagnosis not present

## 2023-12-09 DIAGNOSIS — I4891 Unspecified atrial fibrillation: Secondary | ICD-10-CM | POA: Diagnosis not present

## 2023-12-09 DIAGNOSIS — I10 Essential (primary) hypertension: Secondary | ICD-10-CM | POA: Diagnosis not present

## 2023-12-12 DIAGNOSIS — I10 Essential (primary) hypertension: Secondary | ICD-10-CM | POA: Diagnosis not present

## 2023-12-12 DIAGNOSIS — F03A0 Unspecified dementia, mild, without behavioral disturbance, psychotic disturbance, mood disturbance, and anxiety: Secondary | ICD-10-CM | POA: Diagnosis not present

## 2023-12-12 DIAGNOSIS — M159 Polyosteoarthritis, unspecified: Secondary | ICD-10-CM | POA: Diagnosis not present

## 2023-12-12 DIAGNOSIS — G311 Senile degeneration of brain, not elsewhere classified: Secondary | ICD-10-CM | POA: Diagnosis not present

## 2023-12-12 DIAGNOSIS — I4891 Unspecified atrial fibrillation: Secondary | ICD-10-CM | POA: Diagnosis not present

## 2023-12-12 DIAGNOSIS — K5900 Constipation, unspecified: Secondary | ICD-10-CM | POA: Diagnosis not present

## 2023-12-17 DIAGNOSIS — F03A0 Unspecified dementia, mild, without behavioral disturbance, psychotic disturbance, mood disturbance, and anxiety: Secondary | ICD-10-CM | POA: Diagnosis not present

## 2023-12-17 DIAGNOSIS — K5900 Constipation, unspecified: Secondary | ICD-10-CM | POA: Diagnosis not present

## 2023-12-17 DIAGNOSIS — G311 Senile degeneration of brain, not elsewhere classified: Secondary | ICD-10-CM | POA: Diagnosis not present

## 2023-12-17 DIAGNOSIS — I10 Essential (primary) hypertension: Secondary | ICD-10-CM | POA: Diagnosis not present

## 2023-12-17 DIAGNOSIS — M159 Polyosteoarthritis, unspecified: Secondary | ICD-10-CM | POA: Diagnosis not present

## 2023-12-17 DIAGNOSIS — I4891 Unspecified atrial fibrillation: Secondary | ICD-10-CM | POA: Diagnosis not present

## 2023-12-19 DIAGNOSIS — I10 Essential (primary) hypertension: Secondary | ICD-10-CM | POA: Diagnosis not present

## 2023-12-19 DIAGNOSIS — I4891 Unspecified atrial fibrillation: Secondary | ICD-10-CM | POA: Diagnosis not present

## 2023-12-19 DIAGNOSIS — F03A0 Unspecified dementia, mild, without behavioral disturbance, psychotic disturbance, mood disturbance, and anxiety: Secondary | ICD-10-CM | POA: Diagnosis not present

## 2023-12-19 DIAGNOSIS — M159 Polyosteoarthritis, unspecified: Secondary | ICD-10-CM | POA: Diagnosis not present

## 2023-12-19 DIAGNOSIS — G311 Senile degeneration of brain, not elsewhere classified: Secondary | ICD-10-CM | POA: Diagnosis not present

## 2023-12-19 DIAGNOSIS — K5900 Constipation, unspecified: Secondary | ICD-10-CM | POA: Diagnosis not present

## 2023-12-24 DIAGNOSIS — I4891 Unspecified atrial fibrillation: Secondary | ICD-10-CM | POA: Diagnosis not present

## 2023-12-24 DIAGNOSIS — F03A0 Unspecified dementia, mild, without behavioral disturbance, psychotic disturbance, mood disturbance, and anxiety: Secondary | ICD-10-CM | POA: Diagnosis not present

## 2023-12-24 DIAGNOSIS — K5900 Constipation, unspecified: Secondary | ICD-10-CM | POA: Diagnosis not present

## 2023-12-24 DIAGNOSIS — I10 Essential (primary) hypertension: Secondary | ICD-10-CM | POA: Diagnosis not present

## 2023-12-24 DIAGNOSIS — M159 Polyosteoarthritis, unspecified: Secondary | ICD-10-CM | POA: Diagnosis not present

## 2023-12-24 DIAGNOSIS — G311 Senile degeneration of brain, not elsewhere classified: Secondary | ICD-10-CM | POA: Diagnosis not present

## 2023-12-26 DIAGNOSIS — M159 Polyosteoarthritis, unspecified: Secondary | ICD-10-CM | POA: Diagnosis not present

## 2023-12-26 DIAGNOSIS — I10 Essential (primary) hypertension: Secondary | ICD-10-CM | POA: Diagnosis not present

## 2023-12-26 DIAGNOSIS — I4891 Unspecified atrial fibrillation: Secondary | ICD-10-CM | POA: Diagnosis not present

## 2023-12-26 DIAGNOSIS — F03A0 Unspecified dementia, mild, without behavioral disturbance, psychotic disturbance, mood disturbance, and anxiety: Secondary | ICD-10-CM | POA: Diagnosis not present

## 2023-12-26 DIAGNOSIS — K5900 Constipation, unspecified: Secondary | ICD-10-CM | POA: Diagnosis not present

## 2023-12-26 DIAGNOSIS — E1122 Type 2 diabetes mellitus with diabetic chronic kidney disease: Secondary | ICD-10-CM | POA: Diagnosis not present

## 2023-12-26 DIAGNOSIS — H548 Legal blindness, as defined in USA: Secondary | ICD-10-CM | POA: Diagnosis not present

## 2023-12-26 DIAGNOSIS — E43 Unspecified severe protein-calorie malnutrition: Secondary | ICD-10-CM | POA: Diagnosis not present

## 2023-12-26 DIAGNOSIS — K219 Gastro-esophageal reflux disease without esophagitis: Secondary | ICD-10-CM | POA: Diagnosis not present

## 2023-12-26 DIAGNOSIS — E785 Hyperlipidemia, unspecified: Secondary | ICD-10-CM | POA: Diagnosis not present

## 2023-12-27 DIAGNOSIS — I4891 Unspecified atrial fibrillation: Secondary | ICD-10-CM | POA: Diagnosis not present

## 2023-12-27 DIAGNOSIS — E43 Unspecified severe protein-calorie malnutrition: Secondary | ICD-10-CM | POA: Diagnosis not present

## 2023-12-27 DIAGNOSIS — K5900 Constipation, unspecified: Secondary | ICD-10-CM | POA: Diagnosis not present

## 2023-12-27 DIAGNOSIS — M159 Polyosteoarthritis, unspecified: Secondary | ICD-10-CM | POA: Diagnosis not present

## 2023-12-27 DIAGNOSIS — F03A0 Unspecified dementia, mild, without behavioral disturbance, psychotic disturbance, mood disturbance, and anxiety: Secondary | ICD-10-CM | POA: Diagnosis not present

## 2023-12-27 DIAGNOSIS — I10 Essential (primary) hypertension: Secondary | ICD-10-CM | POA: Diagnosis not present

## 2023-12-31 DIAGNOSIS — M159 Polyosteoarthritis, unspecified: Secondary | ICD-10-CM | POA: Diagnosis not present

## 2023-12-31 DIAGNOSIS — F03A0 Unspecified dementia, mild, without behavioral disturbance, psychotic disturbance, mood disturbance, and anxiety: Secondary | ICD-10-CM | POA: Diagnosis not present

## 2023-12-31 DIAGNOSIS — E43 Unspecified severe protein-calorie malnutrition: Secondary | ICD-10-CM | POA: Diagnosis not present

## 2023-12-31 DIAGNOSIS — I4891 Unspecified atrial fibrillation: Secondary | ICD-10-CM | POA: Diagnosis not present

## 2023-12-31 DIAGNOSIS — K5900 Constipation, unspecified: Secondary | ICD-10-CM | POA: Diagnosis not present

## 2023-12-31 DIAGNOSIS — I10 Essential (primary) hypertension: Secondary | ICD-10-CM | POA: Diagnosis not present

## 2024-01-02 ENCOUNTER — Other Ambulatory Visit: Payer: Self-pay | Admitting: Gastroenterology

## 2024-01-02 DIAGNOSIS — K838 Other specified diseases of biliary tract: Secondary | ICD-10-CM

## 2024-01-02 DIAGNOSIS — I10 Essential (primary) hypertension: Secondary | ICD-10-CM | POA: Diagnosis not present

## 2024-01-02 DIAGNOSIS — F03A0 Unspecified dementia, mild, without behavioral disturbance, psychotic disturbance, mood disturbance, and anxiety: Secondary | ICD-10-CM | POA: Diagnosis not present

## 2024-01-02 DIAGNOSIS — E43 Unspecified severe protein-calorie malnutrition: Secondary | ICD-10-CM | POA: Diagnosis not present

## 2024-01-02 DIAGNOSIS — K5900 Constipation, unspecified: Secondary | ICD-10-CM | POA: Diagnosis not present

## 2024-01-02 DIAGNOSIS — M159 Polyosteoarthritis, unspecified: Secondary | ICD-10-CM | POA: Diagnosis not present

## 2024-01-02 DIAGNOSIS — I4891 Unspecified atrial fibrillation: Secondary | ICD-10-CM | POA: Diagnosis not present

## 2024-01-03 DIAGNOSIS — I10 Essential (primary) hypertension: Secondary | ICD-10-CM | POA: Diagnosis not present

## 2024-01-03 DIAGNOSIS — E43 Unspecified severe protein-calorie malnutrition: Secondary | ICD-10-CM | POA: Diagnosis not present

## 2024-01-03 DIAGNOSIS — M159 Polyosteoarthritis, unspecified: Secondary | ICD-10-CM | POA: Diagnosis not present

## 2024-01-03 DIAGNOSIS — F03A0 Unspecified dementia, mild, without behavioral disturbance, psychotic disturbance, mood disturbance, and anxiety: Secondary | ICD-10-CM | POA: Diagnosis not present

## 2024-01-03 DIAGNOSIS — I4891 Unspecified atrial fibrillation: Secondary | ICD-10-CM | POA: Diagnosis not present

## 2024-01-03 DIAGNOSIS — K5900 Constipation, unspecified: Secondary | ICD-10-CM | POA: Diagnosis not present

## 2024-01-05 NOTE — Telephone Encounter (Signed)
 As previously recommended, this patient needs an OV in order for me to continue to prescribe this medication. She hasn't been see in almost 2 years. Daughter previously said that she was looking into home health and to have a doctor that can visit her at home to take over prescribing medications as patient wasn't able to get out. Has this happened yet?  Otherwise, she will need PCP to prescribe the medication or make an appointment with our office.

## 2024-01-07 DIAGNOSIS — F03A0 Unspecified dementia, mild, without behavioral disturbance, psychotic disturbance, mood disturbance, and anxiety: Secondary | ICD-10-CM | POA: Diagnosis not present

## 2024-01-07 DIAGNOSIS — E43 Unspecified severe protein-calorie malnutrition: Secondary | ICD-10-CM | POA: Diagnosis not present

## 2024-01-07 DIAGNOSIS — M159 Polyosteoarthritis, unspecified: Secondary | ICD-10-CM | POA: Diagnosis not present

## 2024-01-07 DIAGNOSIS — I10 Essential (primary) hypertension: Secondary | ICD-10-CM | POA: Diagnosis not present

## 2024-01-07 DIAGNOSIS — I4891 Unspecified atrial fibrillation: Secondary | ICD-10-CM | POA: Diagnosis not present

## 2024-01-07 DIAGNOSIS — K5900 Constipation, unspecified: Secondary | ICD-10-CM | POA: Diagnosis not present

## 2024-01-10 DIAGNOSIS — I4891 Unspecified atrial fibrillation: Secondary | ICD-10-CM | POA: Diagnosis not present

## 2024-01-10 DIAGNOSIS — E43 Unspecified severe protein-calorie malnutrition: Secondary | ICD-10-CM | POA: Diagnosis not present

## 2024-01-10 DIAGNOSIS — I10 Essential (primary) hypertension: Secondary | ICD-10-CM | POA: Diagnosis not present

## 2024-01-10 DIAGNOSIS — M159 Polyosteoarthritis, unspecified: Secondary | ICD-10-CM | POA: Diagnosis not present

## 2024-01-10 DIAGNOSIS — K5900 Constipation, unspecified: Secondary | ICD-10-CM | POA: Diagnosis not present

## 2024-01-10 DIAGNOSIS — F03A0 Unspecified dementia, mild, without behavioral disturbance, psychotic disturbance, mood disturbance, and anxiety: Secondary | ICD-10-CM | POA: Diagnosis not present

## 2024-01-13 DIAGNOSIS — E43 Unspecified severe protein-calorie malnutrition: Secondary | ICD-10-CM | POA: Diagnosis not present

## 2024-01-13 DIAGNOSIS — M159 Polyosteoarthritis, unspecified: Secondary | ICD-10-CM | POA: Diagnosis not present

## 2024-01-13 DIAGNOSIS — I10 Essential (primary) hypertension: Secondary | ICD-10-CM | POA: Diagnosis not present

## 2024-01-13 DIAGNOSIS — F03A0 Unspecified dementia, mild, without behavioral disturbance, psychotic disturbance, mood disturbance, and anxiety: Secondary | ICD-10-CM | POA: Diagnosis not present

## 2024-01-13 DIAGNOSIS — I4891 Unspecified atrial fibrillation: Secondary | ICD-10-CM | POA: Diagnosis not present

## 2024-01-13 DIAGNOSIS — K5900 Constipation, unspecified: Secondary | ICD-10-CM | POA: Diagnosis not present

## 2024-01-13 NOTE — Telephone Encounter (Signed)
 Spoke to pt daughter Risk analyst) informed her of recommendations. She states that pt is in Hospice and no longer able to walk. Informed her that she needs to call PCP is she needs more medications, since she has not been seen in office in 2 years. She voiced understanding

## 2024-01-14 NOTE — Telephone Encounter (Signed)
 Noted

## 2024-01-17 DIAGNOSIS — M159 Polyosteoarthritis, unspecified: Secondary | ICD-10-CM | POA: Diagnosis not present

## 2024-01-17 DIAGNOSIS — K5900 Constipation, unspecified: Secondary | ICD-10-CM | POA: Diagnosis not present

## 2024-01-17 DIAGNOSIS — F03A0 Unspecified dementia, mild, without behavioral disturbance, psychotic disturbance, mood disturbance, and anxiety: Secondary | ICD-10-CM | POA: Diagnosis not present

## 2024-01-17 DIAGNOSIS — I10 Essential (primary) hypertension: Secondary | ICD-10-CM | POA: Diagnosis not present

## 2024-01-17 DIAGNOSIS — I4891 Unspecified atrial fibrillation: Secondary | ICD-10-CM | POA: Diagnosis not present

## 2024-01-17 DIAGNOSIS — E43 Unspecified severe protein-calorie malnutrition: Secondary | ICD-10-CM | POA: Diagnosis not present

## 2024-01-21 DIAGNOSIS — I10 Essential (primary) hypertension: Secondary | ICD-10-CM | POA: Diagnosis not present

## 2024-01-21 DIAGNOSIS — F03A0 Unspecified dementia, mild, without behavioral disturbance, psychotic disturbance, mood disturbance, and anxiety: Secondary | ICD-10-CM | POA: Diagnosis not present

## 2024-01-21 DIAGNOSIS — I4891 Unspecified atrial fibrillation: Secondary | ICD-10-CM | POA: Diagnosis not present

## 2024-01-21 DIAGNOSIS — M159 Polyosteoarthritis, unspecified: Secondary | ICD-10-CM | POA: Diagnosis not present

## 2024-01-21 DIAGNOSIS — E43 Unspecified severe protein-calorie malnutrition: Secondary | ICD-10-CM | POA: Diagnosis not present

## 2024-01-21 DIAGNOSIS — K5900 Constipation, unspecified: Secondary | ICD-10-CM | POA: Diagnosis not present

## 2024-01-24 DIAGNOSIS — I4891 Unspecified atrial fibrillation: Secondary | ICD-10-CM | POA: Diagnosis not present

## 2024-01-24 DIAGNOSIS — E43 Unspecified severe protein-calorie malnutrition: Secondary | ICD-10-CM | POA: Diagnosis not present

## 2024-01-24 DIAGNOSIS — F03A0 Unspecified dementia, mild, without behavioral disturbance, psychotic disturbance, mood disturbance, and anxiety: Secondary | ICD-10-CM | POA: Diagnosis not present

## 2024-01-24 DIAGNOSIS — K5900 Constipation, unspecified: Secondary | ICD-10-CM | POA: Diagnosis not present

## 2024-01-24 DIAGNOSIS — M159 Polyosteoarthritis, unspecified: Secondary | ICD-10-CM | POA: Diagnosis not present

## 2024-01-24 DIAGNOSIS — I10 Essential (primary) hypertension: Secondary | ICD-10-CM | POA: Diagnosis not present

## 2024-01-26 DIAGNOSIS — I4891 Unspecified atrial fibrillation: Secondary | ICD-10-CM | POA: Diagnosis not present

## 2024-01-26 DIAGNOSIS — M159 Polyosteoarthritis, unspecified: Secondary | ICD-10-CM | POA: Diagnosis not present

## 2024-01-26 DIAGNOSIS — I10 Essential (primary) hypertension: Secondary | ICD-10-CM | POA: Diagnosis not present

## 2024-01-26 DIAGNOSIS — H548 Legal blindness, as defined in USA: Secondary | ICD-10-CM | POA: Diagnosis not present

## 2024-01-26 DIAGNOSIS — E785 Hyperlipidemia, unspecified: Secondary | ICD-10-CM | POA: Diagnosis not present

## 2024-01-26 DIAGNOSIS — K219 Gastro-esophageal reflux disease without esophagitis: Secondary | ICD-10-CM | POA: Diagnosis not present

## 2024-01-26 DIAGNOSIS — K5900 Constipation, unspecified: Secondary | ICD-10-CM | POA: Diagnosis not present

## 2024-01-26 DIAGNOSIS — G311 Senile degeneration of brain, not elsewhere classified: Secondary | ICD-10-CM | POA: Diagnosis not present

## 2024-01-26 DIAGNOSIS — E1122 Type 2 diabetes mellitus with diabetic chronic kidney disease: Secondary | ICD-10-CM | POA: Diagnosis not present

## 2024-01-26 DIAGNOSIS — F03A0 Unspecified dementia, mild, without behavioral disturbance, psychotic disturbance, mood disturbance, and anxiety: Secondary | ICD-10-CM | POA: Diagnosis not present

## 2024-01-30 DIAGNOSIS — G311 Senile degeneration of brain, not elsewhere classified: Secondary | ICD-10-CM | POA: Diagnosis not present

## 2024-01-30 DIAGNOSIS — I10 Essential (primary) hypertension: Secondary | ICD-10-CM | POA: Diagnosis not present

## 2024-01-30 DIAGNOSIS — F03A0 Unspecified dementia, mild, without behavioral disturbance, psychotic disturbance, mood disturbance, and anxiety: Secondary | ICD-10-CM | POA: Diagnosis not present

## 2024-01-30 DIAGNOSIS — K5900 Constipation, unspecified: Secondary | ICD-10-CM | POA: Diagnosis not present

## 2024-01-30 DIAGNOSIS — I4891 Unspecified atrial fibrillation: Secondary | ICD-10-CM | POA: Diagnosis not present

## 2024-01-30 DIAGNOSIS — M159 Polyosteoarthritis, unspecified: Secondary | ICD-10-CM | POA: Diagnosis not present

## 2024-02-06 DIAGNOSIS — M159 Polyosteoarthritis, unspecified: Secondary | ICD-10-CM | POA: Diagnosis not present

## 2024-02-06 DIAGNOSIS — K5900 Constipation, unspecified: Secondary | ICD-10-CM | POA: Diagnosis not present

## 2024-02-06 DIAGNOSIS — G311 Senile degeneration of brain, not elsewhere classified: Secondary | ICD-10-CM | POA: Diagnosis not present

## 2024-02-06 DIAGNOSIS — I4891 Unspecified atrial fibrillation: Secondary | ICD-10-CM | POA: Diagnosis not present

## 2024-02-06 DIAGNOSIS — I10 Essential (primary) hypertension: Secondary | ICD-10-CM | POA: Diagnosis not present

## 2024-02-06 DIAGNOSIS — F03A0 Unspecified dementia, mild, without behavioral disturbance, psychotic disturbance, mood disturbance, and anxiety: Secondary | ICD-10-CM | POA: Diagnosis not present

## 2024-02-11 DIAGNOSIS — G311 Senile degeneration of brain, not elsewhere classified: Secondary | ICD-10-CM | POA: Diagnosis not present

## 2024-02-11 DIAGNOSIS — I4891 Unspecified atrial fibrillation: Secondary | ICD-10-CM | POA: Diagnosis not present

## 2024-02-11 DIAGNOSIS — M159 Polyosteoarthritis, unspecified: Secondary | ICD-10-CM | POA: Diagnosis not present

## 2024-02-11 DIAGNOSIS — K5900 Constipation, unspecified: Secondary | ICD-10-CM | POA: Diagnosis not present

## 2024-02-11 DIAGNOSIS — I10 Essential (primary) hypertension: Secondary | ICD-10-CM | POA: Diagnosis not present

## 2024-02-11 DIAGNOSIS — F03A0 Unspecified dementia, mild, without behavioral disturbance, psychotic disturbance, mood disturbance, and anxiety: Secondary | ICD-10-CM | POA: Diagnosis not present

## 2024-02-13 DIAGNOSIS — I10 Essential (primary) hypertension: Secondary | ICD-10-CM | POA: Diagnosis not present

## 2024-02-13 DIAGNOSIS — M159 Polyosteoarthritis, unspecified: Secondary | ICD-10-CM | POA: Diagnosis not present

## 2024-02-13 DIAGNOSIS — G311 Senile degeneration of brain, not elsewhere classified: Secondary | ICD-10-CM | POA: Diagnosis not present

## 2024-02-13 DIAGNOSIS — F03A0 Unspecified dementia, mild, without behavioral disturbance, psychotic disturbance, mood disturbance, and anxiety: Secondary | ICD-10-CM | POA: Diagnosis not present

## 2024-02-13 DIAGNOSIS — I4891 Unspecified atrial fibrillation: Secondary | ICD-10-CM | POA: Diagnosis not present

## 2024-02-13 DIAGNOSIS — K5900 Constipation, unspecified: Secondary | ICD-10-CM | POA: Diagnosis not present

## 2024-02-17 DIAGNOSIS — K5900 Constipation, unspecified: Secondary | ICD-10-CM | POA: Diagnosis not present

## 2024-02-17 DIAGNOSIS — F03A0 Unspecified dementia, mild, without behavioral disturbance, psychotic disturbance, mood disturbance, and anxiety: Secondary | ICD-10-CM | POA: Diagnosis not present

## 2024-02-17 DIAGNOSIS — G311 Senile degeneration of brain, not elsewhere classified: Secondary | ICD-10-CM | POA: Diagnosis not present

## 2024-02-17 DIAGNOSIS — I10 Essential (primary) hypertension: Secondary | ICD-10-CM | POA: Diagnosis not present

## 2024-02-17 DIAGNOSIS — M159 Polyosteoarthritis, unspecified: Secondary | ICD-10-CM | POA: Diagnosis not present

## 2024-02-17 DIAGNOSIS — I4891 Unspecified atrial fibrillation: Secondary | ICD-10-CM | POA: Diagnosis not present

## 2024-02-18 DIAGNOSIS — K5900 Constipation, unspecified: Secondary | ICD-10-CM | POA: Diagnosis not present

## 2024-02-18 DIAGNOSIS — I4891 Unspecified atrial fibrillation: Secondary | ICD-10-CM | POA: Diagnosis not present

## 2024-02-18 DIAGNOSIS — G311 Senile degeneration of brain, not elsewhere classified: Secondary | ICD-10-CM | POA: Diagnosis not present

## 2024-02-18 DIAGNOSIS — I10 Essential (primary) hypertension: Secondary | ICD-10-CM | POA: Diagnosis not present

## 2024-02-18 DIAGNOSIS — M159 Polyosteoarthritis, unspecified: Secondary | ICD-10-CM | POA: Diagnosis not present

## 2024-02-18 DIAGNOSIS — F03A0 Unspecified dementia, mild, without behavioral disturbance, psychotic disturbance, mood disturbance, and anxiety: Secondary | ICD-10-CM | POA: Diagnosis not present

## 2024-02-20 DIAGNOSIS — M159 Polyosteoarthritis, unspecified: Secondary | ICD-10-CM | POA: Diagnosis not present

## 2024-02-20 DIAGNOSIS — F03A0 Unspecified dementia, mild, without behavioral disturbance, psychotic disturbance, mood disturbance, and anxiety: Secondary | ICD-10-CM | POA: Diagnosis not present

## 2024-02-20 DIAGNOSIS — K5900 Constipation, unspecified: Secondary | ICD-10-CM | POA: Diagnosis not present

## 2024-02-20 DIAGNOSIS — G311 Senile degeneration of brain, not elsewhere classified: Secondary | ICD-10-CM | POA: Diagnosis not present

## 2024-02-20 DIAGNOSIS — I4891 Unspecified atrial fibrillation: Secondary | ICD-10-CM | POA: Diagnosis not present

## 2024-02-20 DIAGNOSIS — I10 Essential (primary) hypertension: Secondary | ICD-10-CM | POA: Diagnosis not present

## 2024-02-25 DIAGNOSIS — G311 Senile degeneration of brain, not elsewhere classified: Secondary | ICD-10-CM | POA: Diagnosis not present

## 2024-02-25 DIAGNOSIS — I4891 Unspecified atrial fibrillation: Secondary | ICD-10-CM | POA: Diagnosis not present

## 2024-02-25 DIAGNOSIS — I10 Essential (primary) hypertension: Secondary | ICD-10-CM | POA: Diagnosis not present

## 2024-02-25 DIAGNOSIS — K219 Gastro-esophageal reflux disease without esophagitis: Secondary | ICD-10-CM | POA: Diagnosis not present

## 2024-02-25 DIAGNOSIS — E1122 Type 2 diabetes mellitus with diabetic chronic kidney disease: Secondary | ICD-10-CM | POA: Diagnosis not present

## 2024-02-25 DIAGNOSIS — H548 Legal blindness, as defined in USA: Secondary | ICD-10-CM | POA: Diagnosis not present

## 2024-02-25 DIAGNOSIS — M159 Polyosteoarthritis, unspecified: Secondary | ICD-10-CM | POA: Diagnosis not present

## 2024-02-25 DIAGNOSIS — F03A0 Unspecified dementia, mild, without behavioral disturbance, psychotic disturbance, mood disturbance, and anxiety: Secondary | ICD-10-CM | POA: Diagnosis not present

## 2024-02-25 DIAGNOSIS — K5900 Constipation, unspecified: Secondary | ICD-10-CM | POA: Diagnosis not present

## 2024-02-25 DIAGNOSIS — E785 Hyperlipidemia, unspecified: Secondary | ICD-10-CM | POA: Diagnosis not present

## 2024-02-27 DIAGNOSIS — I10 Essential (primary) hypertension: Secondary | ICD-10-CM | POA: Diagnosis not present

## 2024-02-27 DIAGNOSIS — M159 Polyosteoarthritis, unspecified: Secondary | ICD-10-CM | POA: Diagnosis not present

## 2024-02-27 DIAGNOSIS — G311 Senile degeneration of brain, not elsewhere classified: Secondary | ICD-10-CM | POA: Diagnosis not present

## 2024-02-27 DIAGNOSIS — I4891 Unspecified atrial fibrillation: Secondary | ICD-10-CM | POA: Diagnosis not present

## 2024-02-27 DIAGNOSIS — F03A0 Unspecified dementia, mild, without behavioral disturbance, psychotic disturbance, mood disturbance, and anxiety: Secondary | ICD-10-CM | POA: Diagnosis not present

## 2024-02-27 DIAGNOSIS — K5900 Constipation, unspecified: Secondary | ICD-10-CM | POA: Diagnosis not present

## 2024-03-02 DIAGNOSIS — G311 Senile degeneration of brain, not elsewhere classified: Secondary | ICD-10-CM | POA: Diagnosis not present

## 2024-03-02 DIAGNOSIS — I4891 Unspecified atrial fibrillation: Secondary | ICD-10-CM | POA: Diagnosis not present

## 2024-03-02 DIAGNOSIS — K5900 Constipation, unspecified: Secondary | ICD-10-CM | POA: Diagnosis not present

## 2024-03-02 DIAGNOSIS — F03A0 Unspecified dementia, mild, without behavioral disturbance, psychotic disturbance, mood disturbance, and anxiety: Secondary | ICD-10-CM | POA: Diagnosis not present

## 2024-03-02 DIAGNOSIS — I10 Essential (primary) hypertension: Secondary | ICD-10-CM | POA: Diagnosis not present

## 2024-03-02 DIAGNOSIS — M159 Polyosteoarthritis, unspecified: Secondary | ICD-10-CM | POA: Diagnosis not present

## 2024-03-03 DIAGNOSIS — K5900 Constipation, unspecified: Secondary | ICD-10-CM | POA: Diagnosis not present

## 2024-03-03 DIAGNOSIS — G311 Senile degeneration of brain, not elsewhere classified: Secondary | ICD-10-CM | POA: Diagnosis not present

## 2024-03-03 DIAGNOSIS — M159 Polyosteoarthritis, unspecified: Secondary | ICD-10-CM | POA: Diagnosis not present

## 2024-03-03 DIAGNOSIS — I4891 Unspecified atrial fibrillation: Secondary | ICD-10-CM | POA: Diagnosis not present

## 2024-03-03 DIAGNOSIS — F03A0 Unspecified dementia, mild, without behavioral disturbance, psychotic disturbance, mood disturbance, and anxiety: Secondary | ICD-10-CM | POA: Diagnosis not present

## 2024-03-03 DIAGNOSIS — I10 Essential (primary) hypertension: Secondary | ICD-10-CM | POA: Diagnosis not present

## 2024-03-04 DIAGNOSIS — G311 Senile degeneration of brain, not elsewhere classified: Secondary | ICD-10-CM | POA: Diagnosis not present

## 2024-03-04 DIAGNOSIS — F03A0 Unspecified dementia, mild, without behavioral disturbance, psychotic disturbance, mood disturbance, and anxiety: Secondary | ICD-10-CM | POA: Diagnosis not present

## 2024-03-04 DIAGNOSIS — K5900 Constipation, unspecified: Secondary | ICD-10-CM | POA: Diagnosis not present

## 2024-03-04 DIAGNOSIS — I10 Essential (primary) hypertension: Secondary | ICD-10-CM | POA: Diagnosis not present

## 2024-03-04 DIAGNOSIS — I4891 Unspecified atrial fibrillation: Secondary | ICD-10-CM | POA: Diagnosis not present

## 2024-03-04 DIAGNOSIS — M159 Polyosteoarthritis, unspecified: Secondary | ICD-10-CM | POA: Diagnosis not present

## 2024-03-12 DIAGNOSIS — F03A0 Unspecified dementia, mild, without behavioral disturbance, psychotic disturbance, mood disturbance, and anxiety: Secondary | ICD-10-CM | POA: Diagnosis not present

## 2024-03-12 DIAGNOSIS — G311 Senile degeneration of brain, not elsewhere classified: Secondary | ICD-10-CM | POA: Diagnosis not present

## 2024-03-12 DIAGNOSIS — M159 Polyosteoarthritis, unspecified: Secondary | ICD-10-CM | POA: Diagnosis not present

## 2024-03-12 DIAGNOSIS — K5900 Constipation, unspecified: Secondary | ICD-10-CM | POA: Diagnosis not present

## 2024-03-12 DIAGNOSIS — I10 Essential (primary) hypertension: Secondary | ICD-10-CM | POA: Diagnosis not present

## 2024-03-12 DIAGNOSIS — I4891 Unspecified atrial fibrillation: Secondary | ICD-10-CM | POA: Diagnosis not present

## 2024-03-19 DIAGNOSIS — I10 Essential (primary) hypertension: Secondary | ICD-10-CM | POA: Diagnosis not present

## 2024-03-19 DIAGNOSIS — F03A0 Unspecified dementia, mild, without behavioral disturbance, psychotic disturbance, mood disturbance, and anxiety: Secondary | ICD-10-CM | POA: Diagnosis not present

## 2024-03-19 DIAGNOSIS — I4891 Unspecified atrial fibrillation: Secondary | ICD-10-CM | POA: Diagnosis not present

## 2024-03-19 DIAGNOSIS — M159 Polyosteoarthritis, unspecified: Secondary | ICD-10-CM | POA: Diagnosis not present

## 2024-03-19 DIAGNOSIS — G311 Senile degeneration of brain, not elsewhere classified: Secondary | ICD-10-CM | POA: Diagnosis not present

## 2024-03-19 DIAGNOSIS — K5900 Constipation, unspecified: Secondary | ICD-10-CM | POA: Diagnosis not present

## 2024-03-23 DIAGNOSIS — I10 Essential (primary) hypertension: Secondary | ICD-10-CM | POA: Diagnosis not present

## 2024-03-23 DIAGNOSIS — G311 Senile degeneration of brain, not elsewhere classified: Secondary | ICD-10-CM | POA: Diagnosis not present

## 2024-03-23 DIAGNOSIS — F03A0 Unspecified dementia, mild, without behavioral disturbance, psychotic disturbance, mood disturbance, and anxiety: Secondary | ICD-10-CM | POA: Diagnosis not present

## 2024-03-23 DIAGNOSIS — K5900 Constipation, unspecified: Secondary | ICD-10-CM | POA: Diagnosis not present

## 2024-03-23 DIAGNOSIS — M159 Polyosteoarthritis, unspecified: Secondary | ICD-10-CM | POA: Diagnosis not present

## 2024-03-23 DIAGNOSIS — I4891 Unspecified atrial fibrillation: Secondary | ICD-10-CM | POA: Diagnosis not present

## 2024-03-25 DIAGNOSIS — M159 Polyosteoarthritis, unspecified: Secondary | ICD-10-CM | POA: Diagnosis not present

## 2024-03-25 DIAGNOSIS — I4891 Unspecified atrial fibrillation: Secondary | ICD-10-CM | POA: Diagnosis not present

## 2024-03-25 DIAGNOSIS — G311 Senile degeneration of brain, not elsewhere classified: Secondary | ICD-10-CM | POA: Diagnosis not present

## 2024-03-25 DIAGNOSIS — F03A0 Unspecified dementia, mild, without behavioral disturbance, psychotic disturbance, mood disturbance, and anxiety: Secondary | ICD-10-CM | POA: Diagnosis not present

## 2024-03-25 DIAGNOSIS — I10 Essential (primary) hypertension: Secondary | ICD-10-CM | POA: Diagnosis not present

## 2024-03-25 DIAGNOSIS — K5900 Constipation, unspecified: Secondary | ICD-10-CM | POA: Diagnosis not present

## 2024-03-27 DIAGNOSIS — K5900 Constipation, unspecified: Secondary | ICD-10-CM | POA: Diagnosis not present

## 2024-03-27 DIAGNOSIS — I10 Essential (primary) hypertension: Secondary | ICD-10-CM | POA: Diagnosis not present

## 2024-03-27 DIAGNOSIS — G311 Senile degeneration of brain, not elsewhere classified: Secondary | ICD-10-CM | POA: Diagnosis not present

## 2024-03-27 DIAGNOSIS — E1122 Type 2 diabetes mellitus with diabetic chronic kidney disease: Secondary | ICD-10-CM | POA: Diagnosis not present

## 2024-03-27 DIAGNOSIS — I4891 Unspecified atrial fibrillation: Secondary | ICD-10-CM | POA: Diagnosis not present

## 2024-03-27 DIAGNOSIS — K219 Gastro-esophageal reflux disease without esophagitis: Secondary | ICD-10-CM | POA: Diagnosis not present

## 2024-03-27 DIAGNOSIS — F03A0 Unspecified dementia, mild, without behavioral disturbance, psychotic disturbance, mood disturbance, and anxiety: Secondary | ICD-10-CM | POA: Diagnosis not present

## 2024-03-27 DIAGNOSIS — E785 Hyperlipidemia, unspecified: Secondary | ICD-10-CM | POA: Diagnosis not present

## 2024-03-27 DIAGNOSIS — M159 Polyosteoarthritis, unspecified: Secondary | ICD-10-CM | POA: Diagnosis not present

## 2024-03-27 DIAGNOSIS — H548 Legal blindness, as defined in USA: Secondary | ICD-10-CM | POA: Diagnosis not present

## 2024-03-30 DIAGNOSIS — F03A0 Unspecified dementia, mild, without behavioral disturbance, psychotic disturbance, mood disturbance, and anxiety: Secondary | ICD-10-CM | POA: Diagnosis not present

## 2024-03-30 DIAGNOSIS — K5900 Constipation, unspecified: Secondary | ICD-10-CM | POA: Diagnosis not present

## 2024-03-30 DIAGNOSIS — I10 Essential (primary) hypertension: Secondary | ICD-10-CM | POA: Diagnosis not present

## 2024-03-30 DIAGNOSIS — G311 Senile degeneration of brain, not elsewhere classified: Secondary | ICD-10-CM | POA: Diagnosis not present

## 2024-03-30 DIAGNOSIS — M159 Polyosteoarthritis, unspecified: Secondary | ICD-10-CM | POA: Diagnosis not present

## 2024-03-30 DIAGNOSIS — I4891 Unspecified atrial fibrillation: Secondary | ICD-10-CM | POA: Diagnosis not present

## 2024-04-06 DIAGNOSIS — I10 Essential (primary) hypertension: Secondary | ICD-10-CM | POA: Diagnosis not present

## 2024-04-06 DIAGNOSIS — K5900 Constipation, unspecified: Secondary | ICD-10-CM | POA: Diagnosis not present

## 2024-04-06 DIAGNOSIS — F03A0 Unspecified dementia, mild, without behavioral disturbance, psychotic disturbance, mood disturbance, and anxiety: Secondary | ICD-10-CM | POA: Diagnosis not present

## 2024-04-06 DIAGNOSIS — M159 Polyosteoarthritis, unspecified: Secondary | ICD-10-CM | POA: Diagnosis not present

## 2024-04-06 DIAGNOSIS — G311 Senile degeneration of brain, not elsewhere classified: Secondary | ICD-10-CM | POA: Diagnosis not present

## 2024-04-06 DIAGNOSIS — I4891 Unspecified atrial fibrillation: Secondary | ICD-10-CM | POA: Diagnosis not present

## 2024-04-07 DIAGNOSIS — F03A0 Unspecified dementia, mild, without behavioral disturbance, psychotic disturbance, mood disturbance, and anxiety: Secondary | ICD-10-CM | POA: Diagnosis not present

## 2024-04-07 DIAGNOSIS — I10 Essential (primary) hypertension: Secondary | ICD-10-CM | POA: Diagnosis not present

## 2024-04-07 DIAGNOSIS — I4891 Unspecified atrial fibrillation: Secondary | ICD-10-CM | POA: Diagnosis not present

## 2024-04-07 DIAGNOSIS — M159 Polyosteoarthritis, unspecified: Secondary | ICD-10-CM | POA: Diagnosis not present

## 2024-04-07 DIAGNOSIS — G311 Senile degeneration of brain, not elsewhere classified: Secondary | ICD-10-CM | POA: Diagnosis not present

## 2024-04-07 DIAGNOSIS — K5900 Constipation, unspecified: Secondary | ICD-10-CM | POA: Diagnosis not present

## 2024-04-13 DIAGNOSIS — I10 Essential (primary) hypertension: Secondary | ICD-10-CM | POA: Diagnosis not present

## 2024-04-13 DIAGNOSIS — F03A0 Unspecified dementia, mild, without behavioral disturbance, psychotic disturbance, mood disturbance, and anxiety: Secondary | ICD-10-CM | POA: Diagnosis not present

## 2024-04-13 DIAGNOSIS — G311 Senile degeneration of brain, not elsewhere classified: Secondary | ICD-10-CM | POA: Diagnosis not present

## 2024-04-13 DIAGNOSIS — K5900 Constipation, unspecified: Secondary | ICD-10-CM | POA: Diagnosis not present

## 2024-04-13 DIAGNOSIS — I4891 Unspecified atrial fibrillation: Secondary | ICD-10-CM | POA: Diagnosis not present

## 2024-04-13 DIAGNOSIS — M159 Polyosteoarthritis, unspecified: Secondary | ICD-10-CM | POA: Diagnosis not present

## 2024-04-20 DIAGNOSIS — F03A0 Unspecified dementia, mild, without behavioral disturbance, psychotic disturbance, mood disturbance, and anxiety: Secondary | ICD-10-CM | POA: Diagnosis not present

## 2024-04-20 DIAGNOSIS — M159 Polyosteoarthritis, unspecified: Secondary | ICD-10-CM | POA: Diagnosis not present

## 2024-04-20 DIAGNOSIS — I10 Essential (primary) hypertension: Secondary | ICD-10-CM | POA: Diagnosis not present

## 2024-04-20 DIAGNOSIS — G311 Senile degeneration of brain, not elsewhere classified: Secondary | ICD-10-CM | POA: Diagnosis not present

## 2024-04-20 DIAGNOSIS — I4891 Unspecified atrial fibrillation: Secondary | ICD-10-CM | POA: Diagnosis not present

## 2024-04-20 DIAGNOSIS — K5900 Constipation, unspecified: Secondary | ICD-10-CM | POA: Diagnosis not present

## 2024-04-21 DIAGNOSIS — I4891 Unspecified atrial fibrillation: Secondary | ICD-10-CM | POA: Diagnosis not present

## 2024-04-21 DIAGNOSIS — F03A0 Unspecified dementia, mild, without behavioral disturbance, psychotic disturbance, mood disturbance, and anxiety: Secondary | ICD-10-CM | POA: Diagnosis not present

## 2024-04-21 DIAGNOSIS — I10 Essential (primary) hypertension: Secondary | ICD-10-CM | POA: Diagnosis not present

## 2024-04-21 DIAGNOSIS — G311 Senile degeneration of brain, not elsewhere classified: Secondary | ICD-10-CM | POA: Diagnosis not present

## 2024-04-21 DIAGNOSIS — K5900 Constipation, unspecified: Secondary | ICD-10-CM | POA: Diagnosis not present

## 2024-04-21 DIAGNOSIS — M159 Polyosteoarthritis, unspecified: Secondary | ICD-10-CM | POA: Diagnosis not present

## 2024-04-27 DIAGNOSIS — G311 Senile degeneration of brain, not elsewhere classified: Secondary | ICD-10-CM | POA: Diagnosis not present

## 2024-04-27 DIAGNOSIS — H548 Legal blindness, as defined in USA: Secondary | ICD-10-CM | POA: Diagnosis not present

## 2024-04-27 DIAGNOSIS — E785 Hyperlipidemia, unspecified: Secondary | ICD-10-CM | POA: Diagnosis not present

## 2024-04-27 DIAGNOSIS — F03A0 Unspecified dementia, mild, without behavioral disturbance, psychotic disturbance, mood disturbance, and anxiety: Secondary | ICD-10-CM | POA: Diagnosis not present

## 2024-04-27 DIAGNOSIS — I4891 Unspecified atrial fibrillation: Secondary | ICD-10-CM | POA: Diagnosis not present

## 2024-04-27 DIAGNOSIS — K219 Gastro-esophageal reflux disease without esophagitis: Secondary | ICD-10-CM | POA: Diagnosis not present

## 2024-04-27 DIAGNOSIS — M159 Polyosteoarthritis, unspecified: Secondary | ICD-10-CM | POA: Diagnosis not present

## 2024-04-27 DIAGNOSIS — I10 Essential (primary) hypertension: Secondary | ICD-10-CM | POA: Diagnosis not present

## 2024-04-27 DIAGNOSIS — E1122 Type 2 diabetes mellitus with diabetic chronic kidney disease: Secondary | ICD-10-CM | POA: Diagnosis not present

## 2024-04-27 DIAGNOSIS — K5900 Constipation, unspecified: Secondary | ICD-10-CM | POA: Diagnosis not present

## 2024-04-28 DIAGNOSIS — I4891 Unspecified atrial fibrillation: Secondary | ICD-10-CM | POA: Diagnosis not present

## 2024-04-28 DIAGNOSIS — M159 Polyosteoarthritis, unspecified: Secondary | ICD-10-CM | POA: Diagnosis not present

## 2024-04-28 DIAGNOSIS — I10 Essential (primary) hypertension: Secondary | ICD-10-CM | POA: Diagnosis not present

## 2024-04-28 DIAGNOSIS — F03A0 Unspecified dementia, mild, without behavioral disturbance, psychotic disturbance, mood disturbance, and anxiety: Secondary | ICD-10-CM | POA: Diagnosis not present

## 2024-04-28 DIAGNOSIS — K5900 Constipation, unspecified: Secondary | ICD-10-CM | POA: Diagnosis not present

## 2024-04-28 DIAGNOSIS — G311 Senile degeneration of brain, not elsewhere classified: Secondary | ICD-10-CM | POA: Diagnosis not present

## 2024-05-04 DIAGNOSIS — I10 Essential (primary) hypertension: Secondary | ICD-10-CM | POA: Diagnosis not present

## 2024-05-04 DIAGNOSIS — M159 Polyosteoarthritis, unspecified: Secondary | ICD-10-CM | POA: Diagnosis not present

## 2024-05-04 DIAGNOSIS — I4891 Unspecified atrial fibrillation: Secondary | ICD-10-CM | POA: Diagnosis not present

## 2024-05-04 DIAGNOSIS — G311 Senile degeneration of brain, not elsewhere classified: Secondary | ICD-10-CM | POA: Diagnosis not present

## 2024-05-04 DIAGNOSIS — K5900 Constipation, unspecified: Secondary | ICD-10-CM | POA: Diagnosis not present

## 2024-05-04 DIAGNOSIS — F03A0 Unspecified dementia, mild, without behavioral disturbance, psychotic disturbance, mood disturbance, and anxiety: Secondary | ICD-10-CM | POA: Diagnosis not present

## 2024-05-07 DIAGNOSIS — K5900 Constipation, unspecified: Secondary | ICD-10-CM | POA: Diagnosis not present

## 2024-05-07 DIAGNOSIS — M159 Polyosteoarthritis, unspecified: Secondary | ICD-10-CM | POA: Diagnosis not present

## 2024-05-07 DIAGNOSIS — G311 Senile degeneration of brain, not elsewhere classified: Secondary | ICD-10-CM | POA: Diagnosis not present

## 2024-05-07 DIAGNOSIS — I10 Essential (primary) hypertension: Secondary | ICD-10-CM | POA: Diagnosis not present

## 2024-05-07 DIAGNOSIS — I4891 Unspecified atrial fibrillation: Secondary | ICD-10-CM | POA: Diagnosis not present

## 2024-05-07 DIAGNOSIS — F03A0 Unspecified dementia, mild, without behavioral disturbance, psychotic disturbance, mood disturbance, and anxiety: Secondary | ICD-10-CM | POA: Diagnosis not present

## 2024-05-14 DIAGNOSIS — I10 Essential (primary) hypertension: Secondary | ICD-10-CM | POA: Diagnosis not present

## 2024-05-14 DIAGNOSIS — M159 Polyosteoarthritis, unspecified: Secondary | ICD-10-CM | POA: Diagnosis not present

## 2024-05-14 DIAGNOSIS — F03A0 Unspecified dementia, mild, without behavioral disturbance, psychotic disturbance, mood disturbance, and anxiety: Secondary | ICD-10-CM | POA: Diagnosis not present

## 2024-05-14 DIAGNOSIS — G311 Senile degeneration of brain, not elsewhere classified: Secondary | ICD-10-CM | POA: Diagnosis not present

## 2024-05-14 DIAGNOSIS — K5900 Constipation, unspecified: Secondary | ICD-10-CM | POA: Diagnosis not present

## 2024-05-14 DIAGNOSIS — I4891 Unspecified atrial fibrillation: Secondary | ICD-10-CM | POA: Diagnosis not present

## 2024-05-18 DIAGNOSIS — F03A0 Unspecified dementia, mild, without behavioral disturbance, psychotic disturbance, mood disturbance, and anxiety: Secondary | ICD-10-CM | POA: Diagnosis not present

## 2024-05-18 DIAGNOSIS — K5900 Constipation, unspecified: Secondary | ICD-10-CM | POA: Diagnosis not present

## 2024-05-18 DIAGNOSIS — G311 Senile degeneration of brain, not elsewhere classified: Secondary | ICD-10-CM | POA: Diagnosis not present

## 2024-05-18 DIAGNOSIS — I10 Essential (primary) hypertension: Secondary | ICD-10-CM | POA: Diagnosis not present

## 2024-05-18 DIAGNOSIS — I4891 Unspecified atrial fibrillation: Secondary | ICD-10-CM | POA: Diagnosis not present

## 2024-05-18 DIAGNOSIS — M159 Polyosteoarthritis, unspecified: Secondary | ICD-10-CM | POA: Diagnosis not present

## 2024-05-21 DIAGNOSIS — G311 Senile degeneration of brain, not elsewhere classified: Secondary | ICD-10-CM | POA: Diagnosis not present

## 2024-05-21 DIAGNOSIS — I10 Essential (primary) hypertension: Secondary | ICD-10-CM | POA: Diagnosis not present

## 2024-05-21 DIAGNOSIS — M159 Polyosteoarthritis, unspecified: Secondary | ICD-10-CM | POA: Diagnosis not present

## 2024-05-21 DIAGNOSIS — F03A0 Unspecified dementia, mild, without behavioral disturbance, psychotic disturbance, mood disturbance, and anxiety: Secondary | ICD-10-CM | POA: Diagnosis not present

## 2024-05-21 DIAGNOSIS — K5900 Constipation, unspecified: Secondary | ICD-10-CM | POA: Diagnosis not present

## 2024-05-21 DIAGNOSIS — I4891 Unspecified atrial fibrillation: Secondary | ICD-10-CM | POA: Diagnosis not present

## 2024-05-27 DIAGNOSIS — N189 Chronic kidney disease, unspecified: Secondary | ICD-10-CM | POA: Diagnosis not present

## 2024-05-27 DIAGNOSIS — K219 Gastro-esophageal reflux disease without esophagitis: Secondary | ICD-10-CM | POA: Diagnosis not present

## 2024-05-27 DIAGNOSIS — E785 Hyperlipidemia, unspecified: Secondary | ICD-10-CM | POA: Diagnosis not present

## 2024-05-27 DIAGNOSIS — E1122 Type 2 diabetes mellitus with diabetic chronic kidney disease: Secondary | ICD-10-CM | POA: Diagnosis not present

## 2024-05-27 DIAGNOSIS — M159 Polyosteoarthritis, unspecified: Secondary | ICD-10-CM | POA: Diagnosis not present

## 2024-05-27 DIAGNOSIS — G311 Senile degeneration of brain, not elsewhere classified: Secondary | ICD-10-CM | POA: Diagnosis not present

## 2024-05-27 DIAGNOSIS — I4891 Unspecified atrial fibrillation: Secondary | ICD-10-CM | POA: Diagnosis not present

## 2024-05-27 DIAGNOSIS — I10 Essential (primary) hypertension: Secondary | ICD-10-CM | POA: Diagnosis not present

## 2024-05-27 DIAGNOSIS — K5900 Constipation, unspecified: Secondary | ICD-10-CM | POA: Diagnosis not present

## 2024-05-27 DIAGNOSIS — H548 Legal blindness, as defined in USA: Secondary | ICD-10-CM | POA: Diagnosis not present

## 2024-06-04 DIAGNOSIS — G311 Senile degeneration of brain, not elsewhere classified: Secondary | ICD-10-CM | POA: Diagnosis not present

## 2024-06-04 DIAGNOSIS — I10 Essential (primary) hypertension: Secondary | ICD-10-CM | POA: Diagnosis not present

## 2024-06-04 DIAGNOSIS — M159 Polyosteoarthritis, unspecified: Secondary | ICD-10-CM | POA: Diagnosis not present

## 2024-06-04 DIAGNOSIS — I4891 Unspecified atrial fibrillation: Secondary | ICD-10-CM | POA: Diagnosis not present

## 2024-06-04 DIAGNOSIS — K5900 Constipation, unspecified: Secondary | ICD-10-CM | POA: Diagnosis not present

## 2024-06-04 DIAGNOSIS — E785 Hyperlipidemia, unspecified: Secondary | ICD-10-CM | POA: Diagnosis not present

## 2024-06-11 DIAGNOSIS — E785 Hyperlipidemia, unspecified: Secondary | ICD-10-CM | POA: Diagnosis not present

## 2024-06-11 DIAGNOSIS — I10 Essential (primary) hypertension: Secondary | ICD-10-CM | POA: Diagnosis not present

## 2024-06-11 DIAGNOSIS — I4891 Unspecified atrial fibrillation: Secondary | ICD-10-CM | POA: Diagnosis not present

## 2024-06-11 DIAGNOSIS — K5900 Constipation, unspecified: Secondary | ICD-10-CM | POA: Diagnosis not present

## 2024-06-11 DIAGNOSIS — M159 Polyosteoarthritis, unspecified: Secondary | ICD-10-CM | POA: Diagnosis not present

## 2024-06-11 DIAGNOSIS — G311 Senile degeneration of brain, not elsewhere classified: Secondary | ICD-10-CM | POA: Diagnosis not present

## 2024-06-12 DIAGNOSIS — I4891 Unspecified atrial fibrillation: Secondary | ICD-10-CM | POA: Diagnosis not present

## 2024-06-12 DIAGNOSIS — M159 Polyosteoarthritis, unspecified: Secondary | ICD-10-CM | POA: Diagnosis not present

## 2024-06-12 DIAGNOSIS — K5900 Constipation, unspecified: Secondary | ICD-10-CM | POA: Diagnosis not present

## 2024-06-12 DIAGNOSIS — I10 Essential (primary) hypertension: Secondary | ICD-10-CM | POA: Diagnosis not present

## 2024-06-12 DIAGNOSIS — E785 Hyperlipidemia, unspecified: Secondary | ICD-10-CM | POA: Diagnosis not present

## 2024-06-12 DIAGNOSIS — G311 Senile degeneration of brain, not elsewhere classified: Secondary | ICD-10-CM | POA: Diagnosis not present

## 2024-06-17 DIAGNOSIS — E785 Hyperlipidemia, unspecified: Secondary | ICD-10-CM | POA: Diagnosis not present

## 2024-06-17 DIAGNOSIS — K5900 Constipation, unspecified: Secondary | ICD-10-CM | POA: Diagnosis not present

## 2024-06-17 DIAGNOSIS — I10 Essential (primary) hypertension: Secondary | ICD-10-CM | POA: Diagnosis not present

## 2024-06-17 DIAGNOSIS — G311 Senile degeneration of brain, not elsewhere classified: Secondary | ICD-10-CM | POA: Diagnosis not present

## 2024-06-17 DIAGNOSIS — I4891 Unspecified atrial fibrillation: Secondary | ICD-10-CM | POA: Diagnosis not present

## 2024-06-17 DIAGNOSIS — M159 Polyosteoarthritis, unspecified: Secondary | ICD-10-CM | POA: Diagnosis not present

## 2024-06-18 DIAGNOSIS — K5900 Constipation, unspecified: Secondary | ICD-10-CM | POA: Diagnosis not present

## 2024-06-18 DIAGNOSIS — I4891 Unspecified atrial fibrillation: Secondary | ICD-10-CM | POA: Diagnosis not present

## 2024-06-18 DIAGNOSIS — M159 Polyosteoarthritis, unspecified: Secondary | ICD-10-CM | POA: Diagnosis not present

## 2024-06-18 DIAGNOSIS — G311 Senile degeneration of brain, not elsewhere classified: Secondary | ICD-10-CM | POA: Diagnosis not present

## 2024-06-18 DIAGNOSIS — E785 Hyperlipidemia, unspecified: Secondary | ICD-10-CM | POA: Diagnosis not present

## 2024-06-18 DIAGNOSIS — I10 Essential (primary) hypertension: Secondary | ICD-10-CM | POA: Diagnosis not present

## 2024-06-19 DIAGNOSIS — K5900 Constipation, unspecified: Secondary | ICD-10-CM | POA: Diagnosis not present

## 2024-06-19 DIAGNOSIS — I10 Essential (primary) hypertension: Secondary | ICD-10-CM | POA: Diagnosis not present

## 2024-06-19 DIAGNOSIS — I4891 Unspecified atrial fibrillation: Secondary | ICD-10-CM | POA: Diagnosis not present

## 2024-06-19 DIAGNOSIS — G311 Senile degeneration of brain, not elsewhere classified: Secondary | ICD-10-CM | POA: Diagnosis not present

## 2024-06-19 DIAGNOSIS — M159 Polyosteoarthritis, unspecified: Secondary | ICD-10-CM | POA: Diagnosis not present

## 2024-06-19 DIAGNOSIS — E785 Hyperlipidemia, unspecified: Secondary | ICD-10-CM | POA: Diagnosis not present

## 2024-06-23 DIAGNOSIS — G311 Senile degeneration of brain, not elsewhere classified: Secondary | ICD-10-CM | POA: Diagnosis not present

## 2024-06-23 DIAGNOSIS — K5900 Constipation, unspecified: Secondary | ICD-10-CM | POA: Diagnosis not present

## 2024-06-23 DIAGNOSIS — E785 Hyperlipidemia, unspecified: Secondary | ICD-10-CM | POA: Diagnosis not present

## 2024-06-23 DIAGNOSIS — M159 Polyosteoarthritis, unspecified: Secondary | ICD-10-CM | POA: Diagnosis not present

## 2024-06-23 DIAGNOSIS — I4891 Unspecified atrial fibrillation: Secondary | ICD-10-CM | POA: Diagnosis not present

## 2024-06-23 DIAGNOSIS — I10 Essential (primary) hypertension: Secondary | ICD-10-CM | POA: Diagnosis not present

## 2024-06-25 DIAGNOSIS — G311 Senile degeneration of brain, not elsewhere classified: Secondary | ICD-10-CM | POA: Diagnosis not present

## 2024-06-25 DIAGNOSIS — I10 Essential (primary) hypertension: Secondary | ICD-10-CM | POA: Diagnosis not present

## 2024-06-25 DIAGNOSIS — M159 Polyosteoarthritis, unspecified: Secondary | ICD-10-CM | POA: Diagnosis not present

## 2024-06-25 DIAGNOSIS — E785 Hyperlipidemia, unspecified: Secondary | ICD-10-CM | POA: Diagnosis not present

## 2024-06-25 DIAGNOSIS — K5900 Constipation, unspecified: Secondary | ICD-10-CM | POA: Diagnosis not present

## 2024-06-25 DIAGNOSIS — I4891 Unspecified atrial fibrillation: Secondary | ICD-10-CM | POA: Diagnosis not present

## 2024-06-27 DIAGNOSIS — H548 Legal blindness, as defined in USA: Secondary | ICD-10-CM | POA: Diagnosis not present

## 2024-06-27 DIAGNOSIS — M159 Polyosteoarthritis, unspecified: Secondary | ICD-10-CM | POA: Diagnosis not present

## 2024-06-27 DIAGNOSIS — E785 Hyperlipidemia, unspecified: Secondary | ICD-10-CM | POA: Diagnosis not present

## 2024-06-27 DIAGNOSIS — K219 Gastro-esophageal reflux disease without esophagitis: Secondary | ICD-10-CM | POA: Diagnosis not present

## 2024-06-27 DIAGNOSIS — I10 Essential (primary) hypertension: Secondary | ICD-10-CM | POA: Diagnosis not present

## 2024-06-27 DIAGNOSIS — I4891 Unspecified atrial fibrillation: Secondary | ICD-10-CM | POA: Diagnosis not present

## 2024-06-27 DIAGNOSIS — G311 Senile degeneration of brain, not elsewhere classified: Secondary | ICD-10-CM | POA: Diagnosis not present

## 2024-06-27 DIAGNOSIS — K5900 Constipation, unspecified: Secondary | ICD-10-CM | POA: Diagnosis not present

## 2024-06-27 DIAGNOSIS — N189 Chronic kidney disease, unspecified: Secondary | ICD-10-CM | POA: Diagnosis not present

## 2024-06-27 DIAGNOSIS — E1122 Type 2 diabetes mellitus with diabetic chronic kidney disease: Secondary | ICD-10-CM | POA: Diagnosis not present

## 2024-06-30 DIAGNOSIS — I10 Essential (primary) hypertension: Secondary | ICD-10-CM | POA: Diagnosis not present

## 2024-06-30 DIAGNOSIS — G311 Senile degeneration of brain, not elsewhere classified: Secondary | ICD-10-CM | POA: Diagnosis not present

## 2024-06-30 DIAGNOSIS — E785 Hyperlipidemia, unspecified: Secondary | ICD-10-CM | POA: Diagnosis not present

## 2024-06-30 DIAGNOSIS — I4891 Unspecified atrial fibrillation: Secondary | ICD-10-CM | POA: Diagnosis not present

## 2024-06-30 DIAGNOSIS — M159 Polyosteoarthritis, unspecified: Secondary | ICD-10-CM | POA: Diagnosis not present

## 2024-06-30 DIAGNOSIS — K5900 Constipation, unspecified: Secondary | ICD-10-CM | POA: Diagnosis not present

## 2024-07-02 DIAGNOSIS — M159 Polyosteoarthritis, unspecified: Secondary | ICD-10-CM | POA: Diagnosis not present

## 2024-07-02 DIAGNOSIS — I10 Essential (primary) hypertension: Secondary | ICD-10-CM | POA: Diagnosis not present

## 2024-07-02 DIAGNOSIS — K5900 Constipation, unspecified: Secondary | ICD-10-CM | POA: Diagnosis not present

## 2024-07-02 DIAGNOSIS — I4891 Unspecified atrial fibrillation: Secondary | ICD-10-CM | POA: Diagnosis not present

## 2024-07-02 DIAGNOSIS — G311 Senile degeneration of brain, not elsewhere classified: Secondary | ICD-10-CM | POA: Diagnosis not present

## 2024-07-02 DIAGNOSIS — E785 Hyperlipidemia, unspecified: Secondary | ICD-10-CM | POA: Diagnosis not present

## 2024-07-07 DIAGNOSIS — M159 Polyosteoarthritis, unspecified: Secondary | ICD-10-CM | POA: Diagnosis not present

## 2024-07-07 DIAGNOSIS — E785 Hyperlipidemia, unspecified: Secondary | ICD-10-CM | POA: Diagnosis not present

## 2024-07-07 DIAGNOSIS — I4891 Unspecified atrial fibrillation: Secondary | ICD-10-CM | POA: Diagnosis not present

## 2024-07-07 DIAGNOSIS — K5900 Constipation, unspecified: Secondary | ICD-10-CM | POA: Diagnosis not present

## 2024-07-07 DIAGNOSIS — G311 Senile degeneration of brain, not elsewhere classified: Secondary | ICD-10-CM | POA: Diagnosis not present

## 2024-07-07 DIAGNOSIS — I10 Essential (primary) hypertension: Secondary | ICD-10-CM | POA: Diagnosis not present

## 2024-07-09 DIAGNOSIS — E785 Hyperlipidemia, unspecified: Secondary | ICD-10-CM | POA: Diagnosis not present

## 2024-07-09 DIAGNOSIS — I10 Essential (primary) hypertension: Secondary | ICD-10-CM | POA: Diagnosis not present

## 2024-07-09 DIAGNOSIS — I4891 Unspecified atrial fibrillation: Secondary | ICD-10-CM | POA: Diagnosis not present

## 2024-07-09 DIAGNOSIS — G311 Senile degeneration of brain, not elsewhere classified: Secondary | ICD-10-CM | POA: Diagnosis not present

## 2024-07-09 DIAGNOSIS — K5900 Constipation, unspecified: Secondary | ICD-10-CM | POA: Diagnosis not present

## 2024-07-09 DIAGNOSIS — M159 Polyosteoarthritis, unspecified: Secondary | ICD-10-CM | POA: Diagnosis not present

## 2024-07-14 DIAGNOSIS — I10 Essential (primary) hypertension: Secondary | ICD-10-CM | POA: Diagnosis not present

## 2024-07-14 DIAGNOSIS — M159 Polyosteoarthritis, unspecified: Secondary | ICD-10-CM | POA: Diagnosis not present

## 2024-07-14 DIAGNOSIS — G311 Senile degeneration of brain, not elsewhere classified: Secondary | ICD-10-CM | POA: Diagnosis not present

## 2024-07-14 DIAGNOSIS — K5900 Constipation, unspecified: Secondary | ICD-10-CM | POA: Diagnosis not present

## 2024-07-14 DIAGNOSIS — E785 Hyperlipidemia, unspecified: Secondary | ICD-10-CM | POA: Diagnosis not present

## 2024-07-14 DIAGNOSIS — I4891 Unspecified atrial fibrillation: Secondary | ICD-10-CM | POA: Diagnosis not present

## 2024-07-16 DIAGNOSIS — I4891 Unspecified atrial fibrillation: Secondary | ICD-10-CM | POA: Diagnosis not present

## 2024-07-16 DIAGNOSIS — K5900 Constipation, unspecified: Secondary | ICD-10-CM | POA: Diagnosis not present

## 2024-07-16 DIAGNOSIS — M159 Polyosteoarthritis, unspecified: Secondary | ICD-10-CM | POA: Diagnosis not present

## 2024-07-16 DIAGNOSIS — E785 Hyperlipidemia, unspecified: Secondary | ICD-10-CM | POA: Diagnosis not present

## 2024-07-16 DIAGNOSIS — I10 Essential (primary) hypertension: Secondary | ICD-10-CM | POA: Diagnosis not present

## 2024-07-16 DIAGNOSIS — G311 Senile degeneration of brain, not elsewhere classified: Secondary | ICD-10-CM | POA: Diagnosis not present

## 2024-07-21 DIAGNOSIS — I10 Essential (primary) hypertension: Secondary | ICD-10-CM | POA: Diagnosis not present

## 2024-07-21 DIAGNOSIS — I4891 Unspecified atrial fibrillation: Secondary | ICD-10-CM | POA: Diagnosis not present

## 2024-07-21 DIAGNOSIS — K5900 Constipation, unspecified: Secondary | ICD-10-CM | POA: Diagnosis not present

## 2024-07-21 DIAGNOSIS — M159 Polyosteoarthritis, unspecified: Secondary | ICD-10-CM | POA: Diagnosis not present

## 2024-07-21 DIAGNOSIS — E785 Hyperlipidemia, unspecified: Secondary | ICD-10-CM | POA: Diagnosis not present

## 2024-07-21 DIAGNOSIS — G311 Senile degeneration of brain, not elsewhere classified: Secondary | ICD-10-CM | POA: Diagnosis not present

## 2024-07-22 DIAGNOSIS — I10 Essential (primary) hypertension: Secondary | ICD-10-CM | POA: Diagnosis not present

## 2024-07-22 DIAGNOSIS — G311 Senile degeneration of brain, not elsewhere classified: Secondary | ICD-10-CM | POA: Diagnosis not present

## 2024-07-22 DIAGNOSIS — M159 Polyosteoarthritis, unspecified: Secondary | ICD-10-CM | POA: Diagnosis not present

## 2024-07-22 DIAGNOSIS — I4891 Unspecified atrial fibrillation: Secondary | ICD-10-CM | POA: Diagnosis not present

## 2024-07-22 DIAGNOSIS — E785 Hyperlipidemia, unspecified: Secondary | ICD-10-CM | POA: Diagnosis not present

## 2024-07-22 DIAGNOSIS — K5900 Constipation, unspecified: Secondary | ICD-10-CM | POA: Diagnosis not present
